# Patient Record
Sex: Female | Born: 1945 | Race: White | Hispanic: No | Marital: Married | Smoking: Never smoker
Health system: Southern US, Community
[De-identification: ages and names within clinical notes are randomized; demographics above are authoritative.]

## PROBLEM LIST (undated history)

## (undated) DIAGNOSIS — M810 Age-related osteoporosis without current pathological fracture: Secondary | ICD-10-CM

## (undated) DIAGNOSIS — H269 Unspecified cataract: Secondary | ICD-10-CM

## (undated) DIAGNOSIS — IMO0002 Reserved for concepts with insufficient information to code with codable children: Secondary | ICD-10-CM

## (undated) DIAGNOSIS — I639 Cerebral infarction, unspecified: Secondary | ICD-10-CM

## (undated) DIAGNOSIS — E079 Disorder of thyroid, unspecified: Secondary | ICD-10-CM

## (undated) DIAGNOSIS — N8110 Cystocele, unspecified: Secondary | ICD-10-CM

## (undated) DIAGNOSIS — R7611 Nonspecific reaction to tuberculin skin test without active tuberculosis: Secondary | ICD-10-CM

## (undated) DIAGNOSIS — M329 Systemic lupus erythematosus, unspecified: Secondary | ICD-10-CM

## (undated) DIAGNOSIS — I10 Essential (primary) hypertension: Secondary | ICD-10-CM

## (undated) DIAGNOSIS — T7840XA Allergy, unspecified, initial encounter: Secondary | ICD-10-CM

## (undated) DIAGNOSIS — H409 Unspecified glaucoma: Secondary | ICD-10-CM

## (undated) HISTORY — DX: Cystocele, unspecified: N81.10

## (undated) HISTORY — DX: Unspecified glaucoma: H40.9

## (undated) HISTORY — DX: Allergy, unspecified, initial encounter: T78.40XA

## (undated) HISTORY — PX: TUBAL LIGATION: SHX77

## (undated) HISTORY — DX: Cerebral infarction, unspecified: I63.9

## (undated) HISTORY — DX: Age-related osteoporosis without current pathological fracture: M81.0

## (undated) HISTORY — PX: EYE SURGERY: SHX253

## (undated) HISTORY — DX: Nonspecific reaction to tuberculin skin test without active tuberculosis: R76.11

## (undated) HISTORY — DX: Essential (primary) hypertension: I10

## (undated) HISTORY — DX: Unspecified cataract: H26.9

---

## 2002-01-13 HISTORY — PX: SCLERAL BUCKLE: SHX5340

## 2018-05-20 DIAGNOSIS — I2510 Atherosclerotic heart disease of native coronary artery without angina pectoris: Secondary | ICD-10-CM | POA: Insufficient documentation

## 2018-05-27 ENCOUNTER — Ambulatory Visit: Payer: Self-pay | Admitting: Internal Medicine

## 2018-08-16 ENCOUNTER — Ambulatory Visit: Payer: Self-pay | Admitting: Internal Medicine

## 2019-02-13 ENCOUNTER — Ambulatory Visit: Payer: Self-pay

## 2019-02-20 ENCOUNTER — Ambulatory Visit: Payer: Medicare Other | Attending: Internal Medicine

## 2019-02-20 DIAGNOSIS — Z23 Encounter for immunization: Secondary | ICD-10-CM | POA: Insufficient documentation

## 2019-02-20 NOTE — Progress Notes (Signed)
   Covid-19 Vaccination Clinic  Name:  Wendy Jackson    MRN: [id number] DOB: [birth year]  02/20/2019  [name] was observed post Covid-19 immunization for 15 minutes without incidence. She was provided with Vaccine Information Sheet and instruction to access the V-Safe system.   [name] was instructed to call 911 with any severe reactions post vaccine: Marland Kitchen Difficulty breathing  . Swelling of your face and throat  . A fast heartbeat  . A bad rash all over your body  . Dizziness and weakness    Immunizations Administered    Name Date Dose VIS Date Route   Pfizer COVID-19 Vaccine 02/20/2019  9:35 AM 0.3 mL 12/24/2018 Intramuscular   Manufacturer: [place]   Lot: [id number]   [place]: [id number]

## 2019-02-24 ENCOUNTER — Ambulatory Visit: Payer: Self-pay

## 2019-03-14 DIAGNOSIS — H269 Unspecified cataract: Secondary | ICD-10-CM

## 2019-03-14 HISTORY — DX: Unspecified cataract: H26.9

## 2019-03-16 ENCOUNTER — Ambulatory Visit: Payer: Medicare Other | Attending: Internal Medicine

## 2019-03-16 DIAGNOSIS — Z23 Encounter for immunization: Secondary | ICD-10-CM | POA: Insufficient documentation

## 2019-03-16 NOTE — Progress Notes (Signed)
   Covid-19 Vaccination Clinic  Name:  Wendy Jackson    MRN: [id number] DOB: 18-Aug-1945  03/16/2019  [name] was observed post Covid-19 immunization for 15 minutes without incident. She was provided with Vaccine Information Sheet and instruction to access the V-Safe system.   [name] was instructed to call 911 with any severe reactions post vaccine: Marland Kitchen Difficulty breathing  . Swelling of face and throat  . A fast heartbeat  . A bad rash all over body  . Dizziness and weakness   Immunizations Administered    Name Date Dose VIS Date Route   Pfizer COVID-19 Vaccine 03/16/2019  4:18 PM 0.3 mL 12/24/2018 Intramuscular   Manufacturer: [place]   Lot: [id number]   [place]: [id number]

## 2019-08-02 NOTE — Patient Instructions (Addendum)
Health Maintenance Due  Topic Date Due   Hepatitis C Screening  Never done   TETANUS/TDAP  Never done   MAMMOGRAM  Never done   COLONOSCOPY  Never done   DEXA SCAN  Never done   PNA vac Low Risk Adult (1 of 2 - PCV13) Never done    No flowsheet data found.  Rheumatolgy: Dr. Estanislado Pandy  Dr. Trudie Reed Dr. Amil Amen

## 2019-08-03 ENCOUNTER — Other Ambulatory Visit: Payer: Self-pay

## 2019-08-04 ENCOUNTER — Encounter: Payer: Self-pay | Admitting: Family Medicine

## 2019-08-04 ENCOUNTER — Ambulatory Visit (INDEPENDENT_AMBULATORY_CARE_PROVIDER_SITE_OTHER): Payer: Medicare Other | Admitting: Family Medicine

## 2019-08-04 VITALS — BP 149/86 | HR 72 | Temp 97.2°F | Ht 62.75 in | Wt 88.8 lb

## 2019-08-04 DIAGNOSIS — Z13820 Encounter for screening for osteoporosis: Secondary | ICD-10-CM

## 2019-08-04 DIAGNOSIS — Z131 Encounter for screening for diabetes mellitus: Secondary | ICD-10-CM

## 2019-08-04 DIAGNOSIS — Z7689 Persons encountering health services in other specified circumstances: Secondary | ICD-10-CM | POA: Diagnosis not present

## 2019-08-04 DIAGNOSIS — E039 Hypothyroidism, unspecified: Secondary | ICD-10-CM | POA: Diagnosis not present

## 2019-08-04 DIAGNOSIS — Z1231 Encounter for screening mammogram for malignant neoplasm of breast: Secondary | ICD-10-CM

## 2019-08-04 DIAGNOSIS — Z1211 Encounter for screening for malignant neoplasm of colon: Secondary | ICD-10-CM

## 2019-08-04 DIAGNOSIS — R030 Elevated blood-pressure reading, without diagnosis of hypertension: Secondary | ICD-10-CM

## 2019-08-04 DIAGNOSIS — Z2821 Immunization not carried out because of patient refusal: Secondary | ICD-10-CM

## 2019-08-04 DIAGNOSIS — Z23 Encounter for immunization: Secondary | ICD-10-CM

## 2019-08-04 DIAGNOSIS — Z13220 Encounter for screening for lipoid disorders: Secondary | ICD-10-CM

## 2019-08-04 DIAGNOSIS — Z1321 Encounter for screening for nutritional disorder: Secondary | ICD-10-CM

## 2019-08-04 MED ORDER — ARMOUR THYROID 30 MG PO TABS: 30.0000 mg | ORAL_TABLET | Freq: Every day | ORAL | 3 refills | Status: DC

## 2019-08-04 NOTE — Progress Notes (Signed)
Wendy Jackson is a 74 y.o. female  Chief Complaint  Patient presents with  . Establish Care    New patient here for CPE.  Pt due for for mammogram, colonoscopy, dexa scan.  Pt would like referrals for all three.  Pt is also due for Tdap and PCV 13 and will discuss with Dr.C.  Pt is not fasting for lab work today.    HPI: Wendy Jackson is a 74 y.o. female here as a new patient to establish care with our office. She is requesting an annual exam. She is not fasting for labs. She recently moved to [place] in 10/2018 and previous PCP in [place], [place]. She was also following with another physician (Dr. Lilian Kapur) from whom she was getting her thyroid medication and "bioidentical hormones" to treat her decreased bone density.  Pt is due/overdue for mammo, colonoscopy (she had never had one), dexa and requests referrals for all 3 today.  Pt is due for Tdap, pneumococcal vaccines. She declines pneumonia vaccine today but is agreeable to Tdap  Med refills needed today: armour thyroid 30mg  daily. Last TFTs in fall 2019  She has a h/o lupus. This dx was questioned by another rheum so pt is unsure. She has rheum in [place] and does not want referral for new rheum here at this time.  History reviewed. No pertinent past medical history.  History reviewed. No pertinent surgical history.  Social History   Socioeconomic History  . Marital status: Married    Spouse name: Not on file  . Number of children: Not on file  . Years of education: Not on file  . Highest education level: Not on file  Occupational History  . Not on file  Tobacco Use  . Smoking status: Never Smoker  . Smokeless tobacco: Never Used  Substance and Sexual Activity  . Alcohol use: Never  . Drug use: Never  . Sexual activity: Not on file  Other Topics Concern  . Not on file  Social History Narrative  . Not on file   Social Determinants of Health   Financial Resource Strain:   . Difficulty of Paying Living Expenses:   Food Insecurity:    . Worried About Charity fundraiser in the Last Year:   . Arboriculturist in the Last Year:   Transportation Needs:   . Film/video editor (Medical):   Marland Kitchen Lack of Transportation (Non-Medical):   Physical Activity:   . Days of Exercise per Week:   . Minutes of Exercise per Session:   Stress:   . Feeling of Stress :   Social Connections:   . Frequency of Communication with Friends and Family:   . Frequency of Social Gatherings with Friends and Family:   . Attends Religious Services:   . Active Member of Clubs or Organizations:   . Attends Archivist Meetings:   Marland Kitchen Marital Status:   Intimate Partner Violence:   . Fear of Current or Ex-Partner:   . Emotionally Abused:   Marland Kitchen Physically Abused:   . Sexually Abused:     History reviewed. No pertinent family history.   Immunization History  Administered Date(s) Administered  . PFIZER SARS-COV-2 Vaccination 02/20/2019, 03/16/2019    Outpatient Encounter Medications as of 08/04/2019  Medication Sig  . ARMOUR THYROID 30 MG tablet Take 1 tablet (30 mg total) by mouth daily.  . cetirizine (ZYRTEC) 10 MG chewable tablet Chew 10 mg by mouth daily.  . Cholecalciferol (VITAMIN D3) 1.25 MG (  50000 UT) TABS Take by mouth.  Marland Kitchen HAWTHORNE PO Take by mouth.  . hydroxychloroquine (PLAQUENIL) 200 MG tablet Take 200 mg by mouth daily.  Marland Kitchen LUMIGAN 0.01 % SOLN   . Magnesium Citrate 100 MG TABS Take 150 mg by mouth. Pt taking 1.5 dose  . Misc Natural Products (AIRBORNE ELDERBERRY) CHEW Chew by mouth.  . Probiotic Product (UP4 PROBIOTICS WOMENS PO) Take by mouth.  Marland Kitchen SIMBRINZA 1-0.2 % SUSP INSTILL 1 DROP INTO BOTH EYES TWICE A DAY  . Ubiquinol 100 MG CAPS Take by mouth.  . [DISCONTINUED] ARMOUR THYROID 30 MG tablet Take 30 mg by mouth daily.   No facility-administered encounter medications on file as of 08/04/2019.     ROS: Gen: no fever, chills  Skin: no rash, itching ENT: no ear pain, ear drainage, nasal congestion, rhinorrhea, sinus  pressure, sore throat Eyes: no blurry vision, double vision Resp: no cough, wheeze,SOB CV: no CP, palpitations, LE edema,  GI: no heartburn, n/v/d/c, abd pain GU: no dysuria, urgency, frequency, hematuria  MSK: no joint pain, myalgias, back pain Neuro: no dizziness, headache, weakness, vertigo Psych: no depression, anxiety, insomnia   Allergies  Allergen Reactions  . Augmentin [Amoxicillin-Pot Clavulanate]   . Griseofulvin Ultramicrosize [Griseofulvin] Nausea Only  . Flexeril [Cyclobenzaprine] Palpitations    BP (!) 149/86 (BP Location: Left Arm)   Pulse 72   Temp (!) 97.2 F (36.2 C) (Temporal)   Ht 5' 2.75" (1.594 m)   Wt (!) 88 lb 12.8 oz (40.3 kg)   SpO2 100%   BMI 15.86 kg/m   BP Readings from Last 3 Encounters:  08/04/19 (!) 149/86    Physical Exam Constitutional:      General: She is not in acute distress.    Appearance: She is well-developed.  HENT:     Head: Normocephalic and atraumatic.     Right Ear: Tympanic membrane and ear canal normal.     Left Ear: Tympanic membrane and ear canal normal.     Nose: Nose normal.  Eyes:     Conjunctiva/sclera: Conjunctivae normal.     Pupils: Pupils are equal, round, and reactive to light.  Neck:     Thyroid: No thyromegaly.  Cardiovascular:     Rate and Rhythm: Normal rate and regular rhythm.     Heart sounds: Normal heart sounds. No murmur heard.   Pulmonary:     Effort: Pulmonary effort is normal. No respiratory distress.     Breath sounds: Normal breath sounds. No wheezing or rhonchi.  Abdominal:     General: Bowel sounds are normal. There is no distension.     Palpations: Abdomen is soft. There is no mass.     Tenderness: There is no abdominal tenderness.  Musculoskeletal:     Cervical back: Neck supple.  Lymphadenopathy:     Cervical: No cervical adenopathy.  Skin:    General: Skin is warm and dry.  Neurological:     Mental Status: She is alert and oriented to person, place, and time.     Motor: No  abnormal muscle tone.     Coordination: Coordination normal.  Psychiatric:        Behavior: Behavior normal.      A/P:  1. Encounter to establish care with new doctor  2. Hypothyroidism, unspecified type Refill: - ARMOUR THYROID 30 MG tablet; Take 1 tablet (30 mg total) by mouth daily.  Dispense: 90 tablet; Refill: 3 - TSH; Future - T4, free; Future  3. Screening for  colon cancer - Ambulatory referral to Gastroenterology  4. Encounter for screening mammogram for malignant neoplasm of breast - MM DIGITAL SCREENING BILATERAL; Future  5. Screening for osteoporosis - DG Bone Density; Future  6. Screening for lipid disorders - Lipid panel; Future  7. Encounter for vitamin deficiency screening - VITAMIN D 25 Hydroxy (Vit-D Deficiency, Fractures); Future  8. Screening for diabetes mellitus (DM) - Comprehensive metabolic panel; Future  9. Need for diphtheria-tetanus-pertussis (Tdap) vaccine - Tdap vaccine greater than or equal to 7yo IM  10. Pneumococcal vaccination declined  11. Elevated BP without diagnosis of hypertension - will await records from previous PCP - pt states BP is always high at office, hasn't checked at home - recommend check at home 3-4x/wk x 2 wks then sending or calling with readings  This visit occurred during the SARS-CoV-2 public health emergency.  Safety protocols were in place, including screening questions prior to the visit, additional usage of staff PPE, and extensive cleaning of exam room while observing appropriate contact time as indicated for disinfecting solutions.

## 2019-08-08 ENCOUNTER — Other Ambulatory Visit: Payer: Self-pay

## 2019-08-09 ENCOUNTER — Other Ambulatory Visit (INDEPENDENT_AMBULATORY_CARE_PROVIDER_SITE_OTHER): Payer: Medicare Other

## 2019-08-09 DIAGNOSIS — Z1321 Encounter for screening for nutritional disorder: Secondary | ICD-10-CM

## 2019-08-09 DIAGNOSIS — Z131 Encounter for screening for diabetes mellitus: Secondary | ICD-10-CM

## 2019-08-09 DIAGNOSIS — Z13220 Encounter for screening for lipoid disorders: Secondary | ICD-10-CM

## 2019-08-09 DIAGNOSIS — E039 Hypothyroidism, unspecified: Secondary | ICD-10-CM | POA: Diagnosis not present

## 2019-08-09 LAB — COMPREHENSIVE METABOLIC PANEL
ALT: 21 U/L (ref 0–35)
AST: 28 U/L (ref 0–37)
Albumin: 4.2 g/dL (ref 3.5–5.2)
Alkaline Phosphatase: 79 U/L (ref 39–117)
BUN: 15 mg/dL (ref 6–23)
CO2: 29 mEq/L (ref 19–32)
Calcium: 9.3 mg/dL (ref 8.4–10.5)
Chloride: 102 mEq/L (ref 96–112)
Creatinine, Ser: 0.71 mg/dL (ref 0.40–1.20)
GFR: 80.78 mL/min (ref >60.00)
Glucose, Bld: 84 mg/dL (ref 70–99)
Potassium: 3.8 mEq/L (ref 3.5–5.1)
Sodium: 137 mEq/L (ref 135–145)
Total Bilirubin: 0.4 mg/dL (ref 0.2–1.2)
Total Protein: 6.8 g/dL (ref 6.0–8.3)

## 2019-08-09 LAB — LIPID PANEL
Cholesterol: 191 mg/dL (ref 0–200)
HDL: 82.4 mg/dL (ref >39.00)
LDL Cholesterol: 97 mg/dL (ref 0–99)
NonHDL: 108.78
Total CHOL/HDL Ratio: 2
Triglycerides: 58 mg/dL (ref 0.0–149.0)
VLDL: 11.6 mg/dL (ref 0.0–40.0)

## 2019-08-09 LAB — T4, FREE: Free T4: 0.74 ng/dL (ref 0.60–1.60)

## 2019-08-09 LAB — TSH: TSH: 2.51 u[IU]/mL (ref 0.35–4.50)

## 2019-08-09 LAB — VITAMIN D 25 HYDROXY (VIT D DEFICIENCY, FRACTURES): VITD: 86.32 ng/mL (ref 30.00–100.00)

## 2019-08-10 ENCOUNTER — Encounter: Payer: Self-pay | Admitting: Family Medicine

## 2019-09-13 ENCOUNTER — Telehealth: Payer: Self-pay | Admitting: Family Medicine

## 2019-09-13 NOTE — Telephone Encounter (Signed)
Attempted to schedule AWV. Unable to LVM.  Will try at later time.  

## 2020-01-14 DIAGNOSIS — I10 Essential (primary) hypertension: Secondary | ICD-10-CM

## 2020-01-14 HISTORY — PX: EYE SURGERY: SHX253

## 2020-01-14 HISTORY — DX: Essential (primary) hypertension: I10

## 2020-02-07 DIAGNOSIS — H401132 Primary open-angle glaucoma, bilateral, moderate stage: Secondary | ICD-10-CM | POA: Diagnosis not present

## 2020-02-15 ENCOUNTER — Encounter: Payer: Self-pay | Admitting: Family Medicine

## 2020-02-21 DIAGNOSIS — H401112 Primary open-angle glaucoma, right eye, moderate stage: Secondary | ICD-10-CM | POA: Diagnosis not present

## 2020-03-06 ENCOUNTER — Ambulatory Visit (INDEPENDENT_AMBULATORY_CARE_PROVIDER_SITE_OTHER): Payer: Medicare Other

## 2020-03-06 VITALS — Ht 62.75 in | Wt 88.0 lb

## 2020-03-06 DIAGNOSIS — Z0000 Encounter for general adult medical examination without abnormal findings: Secondary | ICD-10-CM

## 2020-03-06 NOTE — Progress Notes (Signed)
Subjective:   Wendy Jackson is a 75 y.o. female who presents for an Initial Medicare Annual Wellness Visit.  I connected with Wendy Jackson today by telephone and verified that I am speaking with the correct person using two identifiers. Location patient: home Location provider: work Persons participating in the virtual visit: patient, Marine scientist.    I discussed the limitations, risks, security and privacy concerns of performing an evaluation and management service by telephone and the availability of in person appointments. I also discussed with the patient that there may be a patient responsible charge related to this service. The patient expressed understanding and verbally consented to this telephonic visit.    Interactive audio and video telecommunications were attempted between this provider and patient, however failed, due to patient having technical difficulties OR patient did not have access to video capability.  We continued and completed visit with audio only.  Some vital signs may be absent or patient reported.   Time Spent with patient on telephone encounter: 25 minutes   Review of Systems     Cardiac Risk Factors include: advanced age (>36men, >45 women)     Objective:    Today's Vitals   03/06/20 1545  Weight: 88 lb (39.9 kg)  Height: 5' 2.75" (1.594 m)   Body mass index is 15.71 kg/m.  Advanced Directives 03/06/2020  Does Patient Have a Medical Advance Directive? No  Does patient want to make changes to medical advance directive? Yes (MAU/Ambulatory/Procedural Areas - Information given)    Current Medications (verified) Outpatient Encounter Medications as of 03/06/2020  Medication Sig  . ARMOUR THYROID 30 MG tablet Take 1 tablet (30 mg total) by mouth daily.  . cetirizine (ZYRTEC) 10 MG chewable tablet Chew 10 mg by mouth daily.  . Cholecalciferol (VITAMIN D3) 1.25 MG (50000 UT) TABS Take by mouth.  Marland Kitchen HAWTHORNE PO Take by mouth.  . hydroxychloroquine (PLAQUENIL) 200 MG  tablet Take 200 mg by mouth daily.  Marland Kitchen LUMIGAN 0.01 % SOLN   . Magnesium Citrate 100 MG TABS Take 150 mg by mouth. Pt taking 1.5 dose  . Misc Natural Products (AIRBORNE ELDERBERRY) CHEW Chew by mouth.  . Probiotic Product (UP4 PROBIOTICS WOMENS PO) Take by mouth.  Marland Kitchen SIMBRINZA 1-0.2 % SUSP INSTILL 1 DROP INTO BOTH EYES TWICE A DAY  . Ubiquinol 100 MG CAPS Take by mouth.   No facility-administered encounter medications on file as of 03/06/2020.    Allergies (verified) Augmentin [amoxicillin-pot clavulanate], Tramadol, and Flexeril [cyclobenzaprine]   History: History reviewed. No pertinent past medical history. Past Surgical History:  Procedure Laterality Date  . EYE SURGERY     History reviewed. No pertinent family history. Social History   Socioeconomic History  . Marital status: Married    Spouse name: Not on file  . Number of children: Not on file  . Years of education: Not on file  . Highest education level: Not on file  Occupational History  . Not on file  Tobacco Use  . Smoking status: Never Smoker  . Smokeless tobacco: Never Used  Vaping Use  . Vaping Use: Never used  Substance and Sexual Activity  . Alcohol use: Never  . Drug use: Never  . Sexual activity: Not on file  Other Topics Concern  . Not on file  Social History Narrative  . Not on file   Social Determinants of Health   Financial Resource Strain: Low Risk   . Difficulty of Paying Living Expenses: Not hard at all  Food  Insecurity: No Food Insecurity  . Worried About Charity fundraiser in the Last Year: Never true  . Ran Out of Food in the Last Year: Never true  Transportation Needs: No Transportation Needs  . Lack of Transportation (Medical): No  . Lack of Transportation (Non-Medical): No  Physical Activity: Insufficiently Active  . Days of Exercise per Week: 4 days  . Minutes of Exercise per Session: 10 min  Stress: No Stress Concern Present  . Feeling of Stress : Only a little  Social  Connections: Moderately Integrated  . Frequency of Communication with Friends and Family: More than three times a week  . Frequency of Social Gatherings with Friends and Family: Once a week  . Attends Religious Services: 1 to 4 times per year  . Active Member of Clubs or Organizations: No  . Attends Archivist Meetings: Never  . Marital Status: Married    Tobacco Counseling Counseling given: Not Answered   Clinical Intake:  Pre-visit preparation completed: Yes  Pain : No/denies pain     Nutritional Status: BMI <19  Underweight Nutritional Risks: None Diabetes: No  How often do you need to have someone help you when you read instructions, pamphlets, or other written materials from your doctor or pharmacy?: 1 - Never  Diabetic?No  Interpreter Needed?: No  Information entered by :: Caroleen Hamman LPN   Activities of Daily Living In your present state of health, do you have any difficulty performing the following activities: 03/06/2020  Hearing? N  Vision? N  Difficulty concentrating or making decisions? N  Walking or climbing stairs? N  Dressing or bathing? N  Doing errands, shopping? N  Preparing Food and eating ? N  Using the Toilet? N  In the past six months, have you accidently leaked urine? N  Do you have problems with loss of bowel control? N  Managing your Medications? N  Managing your Finances? N  Housekeeping or managing your Housekeeping? N  Some recent data might be hidden    Patient Care Team: Ronnald Nian, DO as PCP - General (Family Medicine)  Indicate any recent Medical Services you may have received from other than [facility] providers in the past year (date may be approximate).     Assessment:   This is a routine wellness examination for Wendy Jackson.  Hearing/Vision screen  Hearing Screening   125Hz  250Hz  500Hz  1000Hz  2000Hz  3000Hz  4000Hz  6000Hz  8000Hz   Right ear:           Left ear:           Comments: No issues  Vision Screening  Comments: Reading glasses  Last eye exam-01/2020  Dietary issues and exercise activities discussed: Current Exercise Habits: Home exercise routine, Type of exercise: Other - see comments (Eliptical), Time (Minutes): 10, Frequency (Times/Week): 4, Weekly Exercise (Minutes/Week): 40, Intensity: Mild, Exercise limited by: None identified  Goals    . Patient Stated     Increase activity      Depression Screen PHQ 2/9 Scores 03/06/2020  PHQ - 2 Score 0    Fall Risk Fall Risk  03/06/2020  Falls in the past year? 0  Number falls in past yr: 0  Injury with Fall? 0  Follow up Falls prevention discussed    FALL RISK PREVENTION PERTAINING TO THE HOME:  Any stairs in or around the home? Yes  If so, are there any without handrails? No  Home free of loose throw rugs in walkways, pet beds, electrical cords, etc?  Yes  Adequate lighting in your home to reduce risk of falls? Yes   ASSISTIVE DEVICES UTILIZED TO PREVENT FALLS:  Life alert? No  Use of a cane, walker or w/c? No  Grab bars in the bathroom? No  Shower chair or bench in shower? Yes  Elevated toilet seat or a handicapped toilet? No   TIMED UP AND GO:  Was the test performed? No . Phone visit   Cognitive Function:Normal cognitive status assessed by  this Nurse Health Advisor. No abnormalities found.          Immunizations Immunization History  Administered Date(s) Administered  . PFIZER(Purple Top)SARS-COV-2 Vaccination 02/20/2019, 03/16/2019, 11/11/2019  . Tdap 08/04/2019    TDAP status: Up to date  Flu Vaccine status: Declined, Education has been provided regarding the importance of this vaccine but patient still declined. Advised may receive this vaccine at local pharmacy or Health Dept. Aware to provide a copy of the vaccination record if obtained from local pharmacy or Health Dept. Verbalized acceptance and understanding.  Pneumococcal vaccine status: Due, Education has been provided regarding the importance of  this vaccine. Advised may receive this vaccine at local pharmacy or Health Dept. Aware to provide a copy of the vaccination record if obtained from local pharmacy or Health Dept. Verbalized acceptance and understanding.  Covid-19 vaccine status: Completed vaccines  Qualifies for Shingles Vaccine? Yes   Zostavax completed No   Shingrix Completed?: No.    Education has been provided regarding the importance of this vaccine. Patient has been advised to call insurance company to determine out of pocket expense if they have not yet received this vaccine. Advised may also receive vaccine at local pharmacy or Health Dept. Verbalized acceptance and understanding.  Screening Tests Health Maintenance  Topic Date Due  . Hepatitis C Screening  Never done  . COLONOSCOPY (Pts 45-32yrs Insurance coverage will need to be confirmed)  Never done  . MAMMOGRAM  Never done  . DEXA SCAN  Never done  . PNA vac Low Risk Adult (1 of 2 - PCV13) Never done  . INFLUENZA VACCINE  Never done  . TETANUS/TDAP  08/03/2029  . COVID-19 Vaccine  Completed    Health Maintenance  Health Maintenance Due  Topic Date Due  . Hepatitis C Screening  Never done  . COLONOSCOPY (Pts 45-34yrs Insurance coverage will need to be confirmed)  Never done  . MAMMOGRAM  Never done  . DEXA SCAN  Never done  . PNA vac Low Risk Adult (1 of 2 - PCV13) Never done  . INFLUENZA VACCINE  Never done    Colorectal Cancer screening: Due-Declined today.  Mammogram status: Due- Declined today  Bone Density status: Due-Declined today  Lung Cancer Screening: (Low Dose CT Chest recommended if Age 57-80 years, 30 pack-year currently smoking OR have quit w/in 15years.) does not qualify.     Additional Screening:  Hepatitis C Screening: does qualify; Discuss with PCP  Vision Screening: Recommended annual ophthalmology exams for early detection of glaucoma and other disorders of the eye. Is the patient up to date with their annual eye exam?   Yes  Who is the provider or what is the name of the office in which the patient attends annual eye exams? Unsure of name  Dental Screening: Recommended annual dental exams for proper oral hygiene  Community Resource Referral / Chronic Care Management: CRR required this visit?  No   CCM required this visit?  No      Plan:  I have personally reviewed and noted the following in the patient's chart:   . Medical and social history . Use of alcohol, tobacco or illicit drugs  . Current medications and supplements . Functional ability and status . Nutritional status . Physical activity . Advanced directives . List of other physicians . Hospitalizations, surgeries, and ER visits in previous 12 months . Vitals . Screenings to include cognitive, depression, and falls . Referrals and appointments  In addition, I have reviewed and discussed with patient certain preventive protocols, quality metrics, and best practice recommendations. A written personalized care plan for preventive services as well as general preventive health recommendations were provided to patient.   Due to this being a telephonic visit, the after visit summary with patients personalized plan was offered to patient via mail or my-chart. Patient would like to access on my-chart.    Marta Antu, LPN   08/07/3662  Nurse Health Advisor Nurse Notes: None

## 2020-03-06 NOTE — Patient Instructions (Signed)
Wendy Jackson , Thank you for taking time to complete your Medicare Wellness Visit. I appreciate your ongoing commitment to your health goals. Please review the following plan we discussed and let me know if I can assist you in the future.   Screening recommendations/referrals: Colonoscopy: Due-Declined today. Please call the office to schedule if you change. Mammogram: Due-Declined today. Please call the office to schedule if you change. Bone Density: Due-Declined today. Please call the office to schedule if you change. Recommended yearly ophthalmology/optometry visit for glaucoma screening and checkup Recommended yearly dental visit for hygiene and checkup  Vaccinations: Influenza vaccine:Declined Pneumococcal vaccine: Unsure of vaccine status Tdap vaccine: Up to date-Due 08/03/2029. Shingles vaccine: Discuss with pharmacy   Covid-19:Completed vaccines  Advanced directives: Information mailed today  Conditions/risks identified: See problem list  Next appointment: Follow up in one year for your annual wellness visit    Preventive Care 65 Years and Older, Female Preventive care refers to lifestyle choices and visits with your health care provider that can promote health and wellness. What does preventive care include?  A yearly physical exam. This is also called an annual well check.  Dental exams once or twice a year.  Routine eye exams. Ask your health care provider how often you should have your eyes checked.  Personal lifestyle choices, including:  Daily care of your teeth and gums.  Regular physical activity.  Eating a healthy diet.  Avoiding tobacco and drug use.  Limiting alcohol use.  Practicing safe sex.  Taking low-dose aspirin every day.  Taking vitamin and mineral supplements as recommended by your health care provider. What happens during an annual well check? The services and screenings done by your health care provider during your annual well check will  depend on your age, overall health, lifestyle risk factors, and family history of disease. Counseling  Your health care provider may ask you questions about your:  Alcohol use.  Tobacco use.  Drug use.  Emotional well-being.  Home and relationship well-being.  Sexual activity.  Eating habits.  History of falls.  Memory and ability to understand (cognition).  Work and work Statistician.  Reproductive health. Screening  You may have the following tests or measurements:  Height, weight, and BMI.  Blood pressure.  Lipid and cholesterol levels. These may be checked every 5 years, or more frequently if you are over 52 years old.  Skin check.  Lung cancer screening. You may have this screening every year starting at age 74 if you have a 30-pack-year history of smoking and currently smoke or have quit within the past 15 years.  Fecal occult blood test (FOBT) of the stool. You may have this test every year starting at age 74.  Flexible sigmoidoscopy or colonoscopy. You may have a sigmoidoscopy every 5 years or a colonoscopy every 10 years starting at age 74.  Hepatitis C blood test.  Hepatitis B blood test.  Sexually transmitted disease (STD) testing.  Diabetes screening. This is done by checking your blood sugar (glucose) after you have not eaten for a while (fasting). You may have this done every 1-3 years.  Bone density scan. This is done to screen for osteoporosis. You may have this done starting at age 74.  Mammogram. This may be done every 1-2 years. Talk to your health care provider about how often you should have regular mammograms. Talk with your health care provider about your test results, treatment options, and if necessary, the need for more tests. Vaccines  Your  health care provider may recommend certain vaccines, such as:  Influenza vaccine. This is recommended every year.  Tetanus, diphtheria, and acellular pertussis (Tdap, Td) vaccine. You may need a  Td booster every 10 years.  Zoster vaccine. You may need this after age 74.  Pneumococcal 13-valent conjugate (PCV13) vaccine. One dose is recommended after age 1.  Pneumococcal polysaccharide (PPSV23) vaccine. One dose is recommended after age 42. Talk to your health care provider about which screenings and vaccines you need and how often you need them. This information is not intended to replace advice given to you by your health care provider. Make sure you discuss any questions you have with your health care provider. Document Released: 01/26/2015 Document Revised: 09/19/2015 Document Reviewed: 10/31/2014 Elsevier Interactive Patient Education  2017 [place] Prevention in the Home Falls can cause injuries. They can happen to people of all ages. There are many things you can do to make your home safe and to help prevent falls. What can I do on the outside of my home?  Regularly fix the edges of walkways and driveways and fix any cracks.  Remove anything that might make you trip as you walk through a door, such as a raised step or threshold.  Trim any bushes or trees on the path to your home.  Use bright outdoor lighting.  Clear any walking paths of anything that might make someone trip, such as rocks or tools.  Regularly check to see if handrails are loose or broken. Make sure that both sides of any steps have handrails.  Any raised decks and porches should have guardrails on the edges.  Have any leaves, snow, or ice cleared regularly.  Use sand or salt on walking paths during winter.  Clean up any spills in your garage right away. This includes oil or grease spills. What can I do in the bathroom?  Use night lights.  Install grab bars by the toilet and in the tub and shower. Do not use towel bars as grab bars.  Use non-skid mats or decals in the tub or shower.  If you need to sit down in the shower, use a plastic, non-slip stool.  Keep the floor dry. Clean  up any water that spills on the floor as soon as it happens.  Remove soap buildup in the tub or shower regularly.  Attach bath mats securely with double-sided non-slip rug tape.  Do not have throw rugs and other things on the floor that can make you trip. What can I do in the bedroom?  Use night lights.  Make sure that you have a light by your bed that is easy to reach.  Do not use any sheets or blankets that are too big for your bed. They should not hang down onto the floor.  Have a firm chair that has side arms. You can use this for support while you get dressed.  Do not have throw rugs and other things on the floor that can make you trip. What can I do in the kitchen?  Clean up any spills right away.  Avoid walking on wet floors.  Keep items that you use a lot in easy-to-reach places.  If you need to reach something above you, use a strong step stool that has a grab bar.  Keep electrical cords out of the way.  Do not use floor polish or wax that makes floors slippery. If you must use wax, use non-skid floor wax.  Do not  have throw rugs and other things on the floor that can make you trip. What can I do with my stairs?  Do not leave any items on the stairs.  Make sure that there are handrails on both sides of the stairs and use them. Fix handrails that are broken or loose. Make sure that handrails are as long as the stairways.  Check any carpeting to make sure that it is firmly attached to the stairs. Fix any carpet that is loose or worn.  Avoid having throw rugs at the top or bottom of the stairs. If you do have throw rugs, attach them to the floor with carpet tape.  Make sure that you have a light switch at the top of the stairs and the bottom of the stairs. If you do not have them, ask someone to add them for you. What else can I do to help prevent falls?  Wear shoes that:  Do not have high heels.  Have rubber bottoms.  Are comfortable and fit you well.  Are  closed at the toe. Do not wear sandals.  If you use a stepladder:  Make sure that it is fully opened. Do not climb a closed stepladder.  Make sure that both sides of the stepladder are locked into place.  Ask someone to hold it for you, if possible.  Clearly mark and make sure that you can see:  Any grab bars or handrails.  First and last steps.  Where the edge of each step is.  Use tools that help you move around (mobility aids) if they are needed. These include:  Canes.  Walkers.  Scooters.  Crutches.  Turn on the lights when you go into a dark area. Replace any light bulbs as soon as they burn out.  Set up your furniture so you have a clear path. Avoid moving your furniture around.  If any of your floors are uneven, fix them.  If there are any pets around you, be aware of where they are.  Review your medicines with your doctor. Some medicines can make you feel dizzy. This can increase your chance of falling. Ask your doctor what other things that you can do to help prevent falls. This information is not intended to replace advice given to you by your health care provider. Make sure you discuss any questions you have with your health care provider. Document Released: 10/26/2008 Document Revised: 06/07/2015 Document Reviewed: 02/03/2014 Elsevier Interactive Patient Education  2017 [employer].

## 2020-04-03 DIAGNOSIS — H35373 Puckering of macula, bilateral: Secondary | ICD-10-CM | POA: Diagnosis not present

## 2020-04-13 DIAGNOSIS — I639 Cerebral infarction, unspecified: Secondary | ICD-10-CM

## 2020-04-13 HISTORY — DX: Cerebral infarction, unspecified: I63.9

## 2020-04-30 ENCOUNTER — Encounter (HOSPITAL_COMMUNITY): Payer: Self-pay

## 2020-04-30 ENCOUNTER — Emergency Department (HOSPITAL_COMMUNITY): Payer: Medicare Other

## 2020-04-30 ENCOUNTER — Telehealth: Payer: Self-pay | Admitting: Family Medicine

## 2020-04-30 ENCOUNTER — Other Ambulatory Visit: Payer: Self-pay

## 2020-04-30 ENCOUNTER — Inpatient Hospital Stay (HOSPITAL_COMMUNITY)
Admission: EM | Admit: 2020-04-30 | Discharge: 2020-05-04 | DRG: 039 | Disposition: A | Discharge status: Home / Self Care | Payer: Medicare Other | Attending: Internal Medicine | Admitting: Internal Medicine

## 2020-04-30 ENCOUNTER — Other Ambulatory Visit (HOSPITAL_COMMUNITY): Payer: Self-pay

## 2020-04-30 DIAGNOSIS — Z820 Family history of epilepsy and other diseases of the nervous system: Secondary | ICD-10-CM | POA: Diagnosis not present

## 2020-04-30 DIAGNOSIS — G8324 Monoplegia of upper limb affecting left nondominant side: Secondary | ICD-10-CM | POA: Diagnosis not present

## 2020-04-30 DIAGNOSIS — Z885 Allergy status to narcotic agent status: Secondary | ICD-10-CM | POA: Diagnosis not present

## 2020-04-30 DIAGNOSIS — Z881 Allergy status to other antibiotic agents status: Secondary | ICD-10-CM | POA: Diagnosis not present

## 2020-04-30 DIAGNOSIS — R29700 NIHSS score 0: Secondary | ICD-10-CM | POA: Diagnosis not present

## 2020-04-30 DIAGNOSIS — M329 Systemic lupus erythematosus, unspecified: Secondary | ICD-10-CM | POA: Diagnosis not present

## 2020-04-30 DIAGNOSIS — I10 Essential (primary) hypertension: Secondary | ICD-10-CM | POA: Diagnosis present

## 2020-04-30 DIAGNOSIS — Z888 Allergy status to other drugs, medicaments and biological substances status: Secondary | ICD-10-CM

## 2020-04-30 DIAGNOSIS — E785 Hyperlipidemia, unspecified: Secondary | ICD-10-CM | POA: Diagnosis not present

## 2020-04-30 DIAGNOSIS — R42 Dizziness and giddiness: Secondary | ICD-10-CM | POA: Diagnosis not present

## 2020-04-30 DIAGNOSIS — Z791 Long term (current) use of non-steroidal anti-inflammatories (NSAID): Secondary | ICD-10-CM

## 2020-04-30 DIAGNOSIS — Z20822 Contact with and (suspected) exposure to covid-19: Secondary | ICD-10-CM | POA: Diagnosis present

## 2020-04-30 DIAGNOSIS — J309 Allergic rhinitis, unspecified: Secondary | ICD-10-CM | POA: Diagnosis present

## 2020-04-30 DIAGNOSIS — R200 Anesthesia of skin: Secondary | ICD-10-CM | POA: Diagnosis present

## 2020-04-30 DIAGNOSIS — IMO0002 Reserved for concepts with insufficient information to code with codable children: Secondary | ICD-10-CM | POA: Diagnosis present

## 2020-04-30 DIAGNOSIS — E039 Hypothyroidism, unspecified: Secondary | ICD-10-CM | POA: Diagnosis not present

## 2020-04-30 DIAGNOSIS — R531 Weakness: Secondary | ICD-10-CM | POA: Diagnosis not present

## 2020-04-30 DIAGNOSIS — Z79899 Other long term (current) drug therapy: Secondary | ICD-10-CM | POA: Diagnosis not present

## 2020-04-30 DIAGNOSIS — I639 Cerebral infarction, unspecified: Secondary | ICD-10-CM | POA: Diagnosis present

## 2020-04-30 DIAGNOSIS — Z79890 Hormone replacement therapy (postmenopausal): Secondary | ICD-10-CM | POA: Diagnosis not present

## 2020-04-30 DIAGNOSIS — I63231 Cerebral infarction due to unspecified occlusion or stenosis of right carotid arteries: Secondary | ICD-10-CM | POA: Diagnosis not present

## 2020-04-30 DIAGNOSIS — Z8673 Personal history of transient ischemic attack (TIA), and cerebral infarction without residual deficits: Secondary | ICD-10-CM | POA: Diagnosis present

## 2020-04-30 DIAGNOSIS — I6521 Occlusion and stenosis of right carotid artery: Secondary | ICD-10-CM | POA: Diagnosis not present

## 2020-04-30 DIAGNOSIS — I6389 Other cerebral infarction: Secondary | ICD-10-CM | POA: Diagnosis not present

## 2020-04-30 HISTORY — DX: Systemic lupus erythematosus, unspecified: M32.9

## 2020-04-30 HISTORY — DX: Reserved for concepts with insufficient information to code with codable children: IMO0002

## 2020-04-30 HISTORY — DX: Disorder of thyroid, unspecified: E07.9

## 2020-04-30 LAB — URINALYSIS, ROUTINE W REFLEX MICROSCOPIC
Bilirubin Urine: NEGATIVE
Glucose, UA: NEGATIVE mg/dL
Hgb urine dipstick: NEGATIVE
Ketones, ur: NEGATIVE mg/dL
Nitrite: NEGATIVE
Protein, ur: NEGATIVE mg/dL
Specific Gravity, Urine: 1.005 (ref 1.005–1.030)
pH: 7 (ref 5.0–8.0)

## 2020-04-30 LAB — BASIC METABOLIC PANEL
Anion gap: 10 (ref 5–15)
BUN: 18 mg/dL (ref 8–23)
CO2: 26 mmol/L (ref 22–32)
Calcium: 8.9 mg/dL (ref 8.9–10.3)
Chloride: 103 mmol/L (ref 98–111)
Creatinine, Ser: 0.65 mg/dL (ref 0.44–1.00)
GFR, Estimated: >60 mL/min (ref >60)
Glucose, Bld: 111 mg/dL — ABNORMAL HIGH (ref 70–99)
Potassium: 3.9 mmol/L (ref 3.5–5.1)
Sodium: 139 mmol/L (ref 135–145)

## 2020-04-30 LAB — CBC
HCT: 40.8 % (ref 36.0–46.0)
Hemoglobin: 13.3 g/dL (ref 12.0–15.0)
MCH: 31.5 pg (ref 26.0–34.0)
MCHC: 32.6 g/dL (ref 30.0–36.0)
MCV: 96.7 fL (ref 80.0–100.0)
Platelets: 199 10*3/uL (ref 150–400)
RBC: 4.22 MIL/uL (ref 3.87–5.11)
RDW: 12.1 % (ref 11.5–15.5)
WBC: 6.2 10*3/uL (ref 4.0–10.5)
nRBC: 0 % (ref 0.0–0.2)

## 2020-04-30 LAB — HEPATIC FUNCTION PANEL
ALT: 30 U/L (ref 0–44)
AST: 36 U/L (ref 15–41)
Albumin: 4 g/dL (ref 3.5–5.0)
Alkaline Phosphatase: 68 U/L (ref 38–126)
Bilirubin, Direct: 0.1 mg/dL (ref 0.0–0.2)
Indirect Bilirubin: 0.7 mg/dL (ref 0.3–0.9)
Total Bilirubin: 0.8 mg/dL (ref 0.3–1.2)
Total Protein: 6.6 g/dL (ref 6.5–8.1)

## 2020-04-30 LAB — CBG MONITORING, ED: Glucose-Capillary: 91 mg/dL (ref 70–99)

## 2020-04-30 MED ORDER — HYDROXYCHLOROQUINE SULFATE 200 MG PO TABS
200.0000 mg | ORAL_TABLET | Freq: Every day | ORAL | Status: DC
  Administered 2020-05-01 – 2020-05-04 (×4): 200 mg via ORAL
  Filled 2020-04-30 (×4): qty 1

## 2020-04-30 MED ORDER — ATORVASTATIN CALCIUM 10 MG PO TABS
40.0000 mg | ORAL_TABLET | Freq: Every day | ORAL | Status: DC
  Administered 2020-05-01 – 2020-05-04 (×5): 40 mg via ORAL
  Filled 2020-04-30 (×5): qty 4

## 2020-04-30 MED ORDER — THYROID 30 MG PO TABS
30.0000 mg | ORAL_TABLET | Freq: Every day | ORAL | Status: DC
  Administered 2020-05-02 – 2020-05-04 (×3): 30 mg via ORAL
  Filled 2020-04-30 (×5): qty 1

## 2020-04-30 MED ORDER — LORATADINE 10 MG PO TABS
10.0000 mg | ORAL_TABLET | Freq: Every day | ORAL | Status: DC
  Administered 2020-05-01 – 2020-05-04 (×4): 10 mg via ORAL
  Filled 2020-04-30 (×5): qty 1

## 2020-04-30 MED ORDER — ACETAMINOPHEN 325 MG PO TABS
650.0000 mg | ORAL_TABLET | Freq: Four times a day (QID) | ORAL | Status: DC | PRN
  Administered 2020-05-03 – 2020-05-04 (×3): 650 mg via ORAL
  Filled 2020-04-30 (×3): qty 2

## 2020-04-30 MED ORDER — STROKE: EARLY STAGES OF RECOVERY BOOK
Freq: Once | Status: DC
  Filled 2020-04-30: qty 1

## 2020-04-30 MED ORDER — ASPIRIN 325 MG PO TABS
325.0000 mg | ORAL_TABLET | Freq: Once | ORAL | Status: AC
  Administered 2020-04-30: 325 mg via ORAL
  Filled 2020-04-30: qty 1

## 2020-04-30 MED ORDER — ACETAMINOPHEN 650 MG RE SUPP: 650.0000 mg | Freq: Four times a day (QID) | RECTAL | Status: DC | PRN

## 2020-04-30 MED ORDER — ASPIRIN 81 MG PO CHEW
81.0000 mg | CHEWABLE_TABLET | Freq: Every day | ORAL | Status: DC
  Administered 2020-05-01 – 2020-05-04 (×4): 81 mg via ORAL
  Filled 2020-04-30 (×4): qty 1

## 2020-04-30 NOTE — ED Provider Notes (Signed)
MSE was initiated and I personally evaluated the patient and placed orders (if any) at  6:35 PM on April 30, 2020.  The patient appears stable so that the remainder of the MSE may be completed by another provider.   Patient complains of numbness to left hand for 2 days and feeling a little bit dizzy.  Physical exam patient is alert no acute distress MSE complete.  Labs and x-rays ordered   Milton Ferguson, MD 04/30/20 1836

## 2020-04-30 NOTE — ED Provider Notes (Signed)
[facility] DEPT Provider Note   CSN: [id number] Arrival date & time: 04/30/20  1807     History Chief Complaint  Patient presents with  . Dizziness  . Weakness    Kayana Thoen is a 75 y.o. female.  Patient states that she has been having some numbness in her left hand and did feel like she had loss of strength earlier but it is gotten better  The history is provided by the patient and medical records. No language interpreter was used.  Weakness Severity:  Mild Onset quality:  Sudden Timing:  Constant Progression:  Waxing and waning Chronicity:  New Context: not alcohol use   Relieved by:  Nothing Worsened by:  Nothing Ineffective treatments:  None tried Associated symptoms: no abdominal pain, no chest pain, no cough, no diarrhea, no frequency, no headaches and no seizures        Past Medical History:  Diagnosis Date  . Lupus ([place])   . Thyroid disease     There are no problems to display for this patient.   Past Surgical History:  Procedure Laterality Date  . EYE SURGERY       OB History   No obstetric history on file.     Family History  Problem Relation Age of Onset  . ALS Mother   . Dementia Father     Social History   Tobacco Use  . Smoking status: Never Smoker  . Smokeless tobacco: Never Used  Vaping Use  . Vaping Use: Never used  Substance Use Topics  . Alcohol use: Never  . Drug use: Never    Home Medications Prior to Admission medications   Medication Sig Start Date End Date Taking? Authorizing Provider  ARMOUR THYROID 30 MG tablet Take 1 tablet (30 mg total) by mouth daily. 08/04/19   Cirigliano, Garvin Fila, DO  cetirizine (ZYRTEC) 10 MG chewable tablet Chew 10 mg by mouth daily.    [provider]  Cholecalciferol (VITAMIN D3) 1.25 MG (50000 UT) TABS Take by mouth.    [provider]  HAWTHORNE PO Take by mouth.    [provider]  hydroxychloroquine (PLAQUENIL) 200 MG tablet  Take 200 mg by mouth daily. 07/01/19   [provider]  LUMIGAN 0.01 % SOLN  08/03/19   [provider]  Magnesium Citrate 100 MG TABS Take 150 mg by mouth. Pt taking 1.5 dose    [provider]  Misc Natural Products (AIRBORNE ELDERBERRY) CHEW Chew by mouth.    [provider]  Probiotic Product (UP4 PROBIOTICS WOMENS PO) Take by mouth.    [provider]  SIMBRINZA 1-0.2 % SUSP INSTILL 1 DROP INTO BOTH EYES TWICE A DAY 06/27/19   [provider]  Ubiquinol 100 MG CAPS Take by mouth.    [provider]    Allergies    Augmentin [amoxicillin-pot clavulanate], Tramadol, and Flexeril [cyclobenzaprine]  Review of Systems   Review of Systems  Constitutional: Negative for appetite change and fatigue.  HENT: Negative for congestion, ear discharge and sinus pressure.   Eyes: Negative for discharge.  Respiratory: Negative for cough.   Cardiovascular: Negative for chest pain.  Gastrointestinal: Negative for abdominal pain and diarrhea.  Genitourinary: Negative for frequency and hematuria.  Musculoskeletal: Negative for back pain.  Skin: Negative for rash.  Neurological: Positive for weakness. Negative for seizures and headaches.  Psychiatric/Behavioral: Negative for hallucinations.    Physical Exam Updated Vital Signs BP (!) 185/81 (BP Location: Right  Arm)   Pulse 71   Temp 98.3 F (36.8 C) (Oral)   Resp 14   Ht 5\' 4"  (1.626 m)   Wt 45.4 kg   SpO2 99%   BMI 17.16 kg/m   Physical Exam Vitals and nursing note reviewed.  Constitutional:      Appearance: She is well-developed.  HENT:     Head: Normocephalic.     Nose: Nose normal.  Eyes:     General: No scleral icterus.    Conjunctiva/sclera: Conjunctivae normal.  Neck:     Thyroid: No thyromegaly.  Cardiovascular:     Rate and Rhythm: Normal rate and regular rhythm.     Heart sounds: No murmur heard. No friction rub. No gallop.   Pulmonary:     Breath sounds: No  stridor. No wheezing or rales.  Chest:     Chest wall: No tenderness.  Abdominal:     General: There is no distension.     Tenderness: There is no abdominal tenderness. There is no rebound.  Musculoskeletal:        General: Normal range of motion.     Cervical back: Neck supple.     Comments: Minimal numbness to left hand  Lymphadenopathy:     Cervical: No cervical adenopathy.  Skin:    Findings: No erythema or rash.  Neurological:     Mental Status: She is alert and oriented to person, place, and time.     Motor: No abnormal muscle tone.     Coordination: Coordination normal.  Psychiatric:        Behavior: Behavior normal.     ED Results / Procedures / Treatments   Labs (all labs ordered are listed, but only abnormal results are displayed) Labs Reviewed  BASIC METABOLIC PANEL - Abnormal; Notable for the following components:      Result Value   Glucose, Bld 111 (*)    All other components within normal limits  URINALYSIS, ROUTINE W REFLEX MICROSCOPIC - Abnormal; Notable for the following components:   Color, Urine STRAW (*)    Leukocytes,Ua LARGE (*)    Bacteria, UA RARE (*)    All other components within normal limits  URINE CULTURE  CBC  HEPATIC FUNCTION PANEL  CBG MONITORING, ED    EKG None  Radiology DG Chest 2 View  Result Date: 04/30/2020 CLINICAL DATA:  Numbness, left hand weakness EXAM: CHEST - 2 VIEW COMPARISON:  None. FINDINGS: There is hyperinflation of the lungs compatible with COPD. Calcified granuloma in the left upper lobe. Biapical scarring. No acute confluent opacities. Heart is normal size. No acute bony abnormality. IMPRESSION: COPD/chronic changes.  No active disease. Electronically Signed   By: Rolm Baptise M.D.   On: 04/30/2020 19:13   CT Head Wo Contrast  Result Date: 04/30/2020 CLINICAL DATA:  Dizziness, left hand numbness EXAM: CT HEAD WITHOUT CONTRAST TECHNIQUE: Contiguous axial images were obtained from the base of the skull through the  vertex without intravenous contrast. COMPARISON:  None. FINDINGS: Brain: No acute intracranial abnormality. Specifically, no hemorrhage, hydrocephalus, mass lesion, acute infarction, or significant intracranial injury. Vascular: No hyperdense vessel or unexpected calcification. Skull: No acute calvarial abnormality. Sinuses/Orbits: Visualized paranasal sinuses and mastoids clear. Orbital soft tissues unremarkable. Other: None IMPRESSION: No acute intracranial abnormality. Electronically Signed   By: Rolm Baptise M.D.   On: 04/30/2020 19:30   MR BRAIN WO CONTRAST  Result Date: 04/30/2020 CLINICAL DATA:  Dizziness with left hand numbness EXAM: MRI HEAD WITHOUT CONTRAST TECHNIQUE:  Multiplanar, multiecho pulse sequences of the brain and surrounding structures were obtained without intravenous contrast. COMPARISON:  None. 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 No other abnormal diffusion restriction. No acute or chronic hemorrhage. There is multifocal hyperintense T2-weighted signal within the white matter. Parenchymal volume and CSF spaces are normal. The midline structures are normal. Vascular: Major flow voids are preserved. Skull and upper cervical spine: Normal calvarium and skull base. Visualized upper cervical spine and soft tissues are normal. Sinuses/Orbits:No paranasal sinus fluid levels or advanced mucosal thickening. No mastoid or middle ear effusion. Normal orbits. IMPRESSION: 1. 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 No hemorrhage or mass effect. 2. Findings of chronic small vessel ischemia. Electronically Signed   By: Ulyses Jarred M.D.   On: 04/30/2020 22:34    Procedures Procedures   Medications Ordered in ED Medications - No data to display  ED Course  I have reviewed the triage vital signs and the nursing notes.  Pertinent labs & imaging results that were available during my care of the patient were  reviewed by me and considered in my medical decision making (see chart for details).    CRITICAL CARE Performed by: Milton Ferguson Total critical care time: 45 minutes Critical care time was exclusive of separately billable procedures and treating other patients. Critical care was necessary to treat or prevent imminent or life-threatening deterioration. Critical care was time spent personally by me on the following activities: development of treatment plan with patient and/or surrogate as well as nursing, discussions with consultants, evaluation of patient's response to treatment, examination of patient, obtaining history from patient or surrogate, ordering and performing treatments and interventions, ordering and review of laboratory studies, ordering and review of radiographic studies, pulse oximetry and re-evaluation of patient's condition. MRI shows small stroke.  I spoke with Dr. Cheral Marker neurology and he recommended a CT angio of the head neck with admission by hospitalist, MDM Rules/Calculators/A&P                          Patient with a small stroke seen on MRI.  She will be admitted to the hospitalist service with neurology consult account Final Clinical Impression(s) / ED Diagnoses Final diagnoses:  None    Rx / DC Orders ED Discharge Orders    None       Milton Ferguson, MD 04/30/20 2307

## 2020-04-30 NOTE — Telephone Encounter (Signed)
Pt called in stating she was experiencing numbing of her left hand, dizziness and unsteady walking at times. I called the triage nurse and transferred her over.

## 2020-04-30 NOTE — ED Triage Notes (Signed)
Patient c/o weakness, dizziness, and left hand numbness x 2-3 days. Patient states she felt normal this morning and then this afternoon symptoms returned.

## 2020-04-30 NOTE — H&P (Signed)
History and Physical    PLEASE NOTE THAT DRAGON DICTATION SOFTWARE WAS USED IN THE CONSTRUCTION OF THIS NOTE.   Wendy Jackson PXT:[id number] DOB: 1945/10/07 DOA: 04/30/2020  PCP: Ronnald Nian, DO Patient coming from: home   I have personally briefly reviewed patient's old medical records in [place]  Chief Complaint: left hand numbness  HPI: Wendy Jackson is a 75 y.o. female with medical history significant for lupus, acquired hypothyroidism, allergic rhinitis who is admitted to [facility] on 04/30/2020 with acute ischemic stroke after presenting from home to [facility] ED complaining of left hand numbness.   The patient reports acute onset of left hand numbness on Friday, April 27, 2020.  She believes that a onset of this occurred mid afternoon on that day, although she is unable to pinpoint a specific time that she first noticed this symptom.  She states that her left hand numbness has been constant since onset, although she feels that numbness is decreasing in terms of severity over the last 12-24 hours, but still present at this time.  She states that the numbness starts at the level of the left wrist and distally across the palmar and dorsal surfaces and terminates in the distal aspect of all 5 digits.  She notes associated decrease in grip strength over that time, noting that she has dropped objects that she normally would have no trouble carrying, including pieces of paper and the cap to a lip balm.  Otherwise, she denies any recent change in sensation, including no additional numbness or paresthesias.  She also notes no additional acute weakness.  She reports ongoing dizziness since the time of initially noting the left hand numbness, but denies any associated vertigo, nausea, vomiting, change in vision, blurry vision, diplopia, word finding difficulties, or headache.  She also denies any associated facial droop or slurred speech.  Denies any history of prior similar symptoms  and no history of stroke.  She denies any associated chest pain, shortness of breath, palpitations, diaphoresis, presyncope, or syncope.   Denies any previous history of stroke. In terms of modifiable risk factors, denies a known history of underlying hypertension, hyperlipidemia, diabetes, atrial fibrillation, or obstructive sleep apnea.  She reports that she is a lifelong non-smoker, and denies any use of recreational drugs.  Not on any blood thinners as an outpatient, including no aspirin.  She also reports that she has not on any statin medication at home.  Denies any associated subjective fever, chills, rigors, or generalized myalgias.  No recent neck stiffness, wheezing, cough, abdominal pain, diarrhea, or rash.  No recent travel or known COVID-19 exposures.  Denies any recent dysuria, gross hematuria, or change in urinary urgency/frequency.      ED Course:  Vital signs in the ED were notable for the following: Temperature max 98.3, heart rate 7181; blood pressure 162/83; respiratory rate 15-21; oxygen saturation 98 100% on room air.  Labs were notable for the following: CMP was notable for the following: Sodium 139, potassium 3.9, bicarbonate 26, creatinine 0.65, glucose 111.  CBC notable for white blood cell count 6200.  Urinalysis showed 6-10 white blood cells and rare bacteria.  Screening nasopharyngeal COVID-19 PCR performed in [facility] long emergency department this evening, and result is pending at this time.  Chest x-ray showed no evidence of acute cardiopulmonary process.  Noncontrast CT head showed no evidence of acute intracranial process, including no evidence of intracranial hemorrhage.  MRI of the brain without contrast showed a small acute  infarct involving the right precentral and postcentral gyri, without evidence of hemorrhage or mass-effect.  The patient's case and imaging were discussed with the on-call neurologist, Dr. Cheral Marker, Who recommends admission to [facility] for  further evaluation and management of acute ischemic stroke, including additional imaging in the form of CTA of the head and neck.  Additionally, Dr.Lindzen recommends further assessment of potential modifiable acute ischemic CVA respecters, including checking hemoglobin A1c, lipid panel and telemetry monitoring for paroxysmal atrial fibrillation.  Dr. Cheral Marker will see the patient after arrival at [facility].   Subsequently, the patient was admitted to [facility] for further evaluation and management of presenting acute ischemic stroke.    Review of Systems: As per HPI otherwise 10 point review of systems negative.   Past Medical History:  Diagnosis Date  . Lupus ([place])   . Thyroid disease     Past Surgical History:  Procedure Laterality Date  . EYE SURGERY      Social History:  reports that she has never smoked. She has never used smokeless tobacco. She reports that she does not drink alcohol and does not use drugs.   Allergies  Allergen Reactions  . Augmentin [Amoxicillin-Pot Clavulanate] Other (See Comments)    unk  . Tramadol     Nausea   . Flexeril [Cyclobenzaprine] Palpitations    Family History  Problem Relation Age of Onset  . ALS Mother   . Dementia Father       Prior to Admission medications   Medication Sig Start Date End Date Taking? Authorizing Provider  ARMOUR THYROID 30 MG tablet Take 1 tablet (30 mg total) by mouth daily. 08/04/19  Yes Cirigliano, Mary K, DO  cetirizine (ZYRTEC) 10 MG chewable tablet Chew 10 mg by mouth daily.   Yes [provider]  Cholecalciferol (VITAMIN D3) 1.25 MG (50000 UT) TABS Take 5,000 Units by mouth daily.   Yes [provider]  HAWTHORNE PO Take 1 tablet by mouth daily.   Yes [provider]  hydroxychloroquine (PLAQUENIL) 200 MG tablet Take 200 mg by mouth daily. 07/01/19  Yes [provider]  levOCARNitine L-Tartrate (L-CARNITINE) 500 MG CAPS Take 500 mg by mouth daily.   Yes  [provider]  LUMIGAN 0.01 % SOLN Place 1 drop into both eyes at bedtime. 08/03/19  Yes [provider]  Magnesium Citrate 100 MG TABS Take 150 mg by mouth. Pt taking 1.5 dose   Yes [provider]  Misc Natural Products (AIRBORNE ELDERBERRY) CHEW Chew 1 tablet by mouth in the morning and at bedtime.   Yes [provider]  Misc Natural Products (CURCUMAX PRO PO) Take 250 mg by mouth daily.   Yes [provider]  Probiotic Product (UP4 PROBIOTICS WOMENS PO) Take 1 capsule by mouth daily.   Yes [provider]  Resveratrol 50 MG CAPS Take 50 mg by mouth daily.   Yes [provider]  SIMBRINZA 1-0.2 % SUSP Place 1 drop into both eyes in the morning, at noon, and at bedtime. 06/27/19  Yes [provider]  Ubiquinol 100 MG CAPS Take 100 mg by mouth.   Yes [provider]  VITAMIN K PO Take 180 mcg by mouth daily.   Yes [provider]     Objective    Physical Exam: Vitals:   04/30/20 2228 04/30/20 2229 04/30/20 2230 04/30/20 2238  BP:   (!) 162/83   Pulse: 78 72 69 80  Resp: (!) 22 20  10 16  Temp:      TempSrc:      SpO2: 99% 99% 99% 99%  Weight:      Height:        General: appears to be stated age; alert, oriented Skin: warm, dry, no rash Head:  AT/[place] Mouth:  Oral mucosa membranes appear moist, normal dentition Neck: supple; trachea midline Heart:  RRR; did not appreciate any M/R/G Lungs: CTAB, did not appreciate any wheezes, rales, or rhonchi Abdomen: + BS; soft, ND, NT Vascular: 2+ pedal pulses b/l; 2+ radial pulses b/l Extremities: no peripheral edema, no muscle wasting Neuro: 5/5 strength of the proximal and distal flexors and extensors of the upper and lower extremities bilaterally; mild decrease in sensation over the dorsal aspect of of the left hand and extending into all 5 digits; otherwise, sensation intact in upper and lower extremities b/l; cranial nerves II through XII grossly  intact; no pronator drift; no evidence suggestive of slurred speech, dysarthria, or facial droop; Normal muscle tone. No tremors.    Labs on Admission: I have personally reviewed following labs and imaging studies  CBC: Recent Labs  Lab 04/30/20 1844  WBC 6.2  HGB 13.3  HCT 40.8  MCV 96.7  PLT 329   Basic Metabolic Panel: Recent Labs  Lab 04/30/20 1844  NA 139  K 3.9  CL 103  CO2 26  GLUCOSE 111*  BUN 18  CREATININE 0.65  CALCIUM 8.9   GFR: Estimated Creatinine Clearance: 44.9 mL/min (by C-G formula based on SCr of 0.65 mg/dL). Liver Function Tests: Recent Labs  Lab 04/30/20 1845  AST 36  ALT 30  ALKPHOS 68  BILITOT 0.8  PROT 6.6  ALBUMIN 4.0   No results for input(s): LIPASE, AMYLASE in the last 168 hours. No results for input(s): AMMONIA in the last 168 hours. Coagulation Profile: No results for input(s): INR, PROTIME in the last 168 hours. Cardiac Enzymes: No results for input(s): CKTOTAL, CKMB, CKMBINDEX, TROPONINI in the last 168 hours. BNP (last 3 results) No results for input(s): PROBNP in the last 8760 hours. HbA1C: No results for input(s): HGBA1C in the last 72 hours. CBG: Recent Labs  Lab 04/30/20 2027  GLUCAP 91   Lipid Profile: No results for input(s): CHOL, HDL, LDLCALC, TRIG, CHOLHDL, LDLDIRECT in the last 72 hours. Thyroid Function Tests: No results for input(s): TSH, T4TOTAL, FREET4, T3FREE, THYROIDAB in the last 72 hours. Anemia Panel: No results for input(s): VITAMINB12, FOLATE, FERRITIN, TIBC, IRON, RETICCTPCT in the last 72 hours. Urine analysis:    Component Value Date/Time   COLORURINE STRAW (A) 04/30/2020 2030   APPEARANCEUR CLEAR 04/30/2020 2030   [place] 1.005 04/30/2020 2030   [place] 7.0 04/30/2020 2030   GLUCOSEU NEGATIVE 04/30/2020 2030   HGBUR NEGATIVE 04/30/2020 2030   [place] 04/30/2020 2030   [place] 04/30/2020 2030   PROTEINUR NEGATIVE 04/30/2020 2030   NITRITE NEGATIVE 04/30/2020  2030   [place] (A) 04/30/2020 2030    Radiological Exams on Admission: DG Chest 2 View  Result Date: 04/30/2020 CLINICAL DATA:  Numbness, left hand weakness EXAM: CHEST - 2 VIEW COMPARISON:  None. FINDINGS: There is hyperinflation of the lungs compatible with COPD. Calcified granuloma in the left upper lobe. Biapical scarring. No acute confluent opacities. Heart is normal size. No acute bony abnormality. IMPRESSION: COPD/chronic changes.  No active disease. Electronically Signed   By: Rolm Baptise M.D.   On: 04/30/2020 19:13   CT Head Wo Contrast  Result Date:  04/30/2020 CLINICAL DATA:  Dizziness, left hand numbness EXAM: CT HEAD WITHOUT CONTRAST TECHNIQUE: Contiguous axial images were obtained from the base of the skull through the vertex without intravenous contrast. COMPARISON:  None. FINDINGS: Brain: No acute intracranial abnormality. Specifically, no hemorrhage, hydrocephalus, mass lesion, acute infarction, or significant intracranial injury. Vascular: No hyperdense vessel or unexpected calcification. Skull: No acute calvarial abnormality. Sinuses/Orbits: Visualized paranasal sinuses and mastoids clear. Orbital soft tissues unremarkable. Other: None IMPRESSION: No acute intracranial abnormality. Electronically Signed   By: Rolm Baptise M.D.   On: 04/30/2020 19:30   MR BRAIN WO CONTRAST  Result Date: 04/30/2020 CLINICAL DATA:  Dizziness with left hand numbness EXAM: MRI HEAD WITHOUT CONTRAST TECHNIQUE: Multiplanar, multiecho pulse sequences of the brain and surrounding structures were obtained without intravenous contrast. COMPARISON:  None. 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 No other abnormal diffusion restriction. No acute or chronic hemorrhage. There is multifocal hyperintense T2-weighted signal within the white matter. Parenchymal volume and CSF spaces are normal. The midline structures are normal. Vascular: Major flow voids are  preserved. Skull and upper cervical spine: Normal calvarium and skull base. Visualized upper cervical spine and soft tissues are normal. Sinuses/Orbits:No paranasal sinus fluid levels or advanced mucosal thickening. No mastoid or middle ear effusion. Normal orbits. IMPRESSION: 1. 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 No hemorrhage or mass effect. 2. Findings of chronic small vessel ischemia. Electronically Signed   By: Ulyses Jarred M.D.   On: 04/30/2020 22:34     Assessment/Plan   Wendy Jackson is a 75 y.o. female with medical history significant for lupus, acquired hypothyroidism, allergic rhinitis who is admitted to [facility] on 04/30/2020 with acute ischemic stroke after presenting from home to [facility] ED complaining of left hand numbness.    Principal Problem:   Acute ischemic stroke [facility]) Active Problems:   Acquired hypothyroidism   Lupus (HCC)   Numbness of left hand   Allergic rhinitis    #) Acute ischemic stroke: Diagnosis on the basis of 3 days of acute onset left hand numbness with potential associated decreased grip strength in that hand, with MRI brain showing evidence of small acute infarct involving the right precentral and postcentral gyri, as further noted above. The patient's case and imaging were discussed with the on-call neurologist, Dr. Cheral Marker, Who recommends admission to [facility] for further evaluation and management of acute ischemic stroke, including additional imaging in the form of CTA of the head and neck.  Additionally, Dr.Lindzen recommends further assessment of potential modifiable acute ischemic CVA respecters, including checking hemoglobin A1c, lipid panel and telemetry monitoring for paroxysmal atrial fibrillation.  Dr. Cheral Marker will see the patient after arrival at [facility].   Of note, the patient does not appear to possess any modifiable CVA risk factors, per her report at this time, including denial of any history of  history of type 2 diabetes mellitus, hypertension, hyperlipidemia, tobacco abuse, OSA, or paroxsymal atrial fibrillation. Will check ekg at this time.  Given the onset of the patient's symptoms 3 days ago, she presents to the ED today while outside of the timeframe for tPA administration.  Not currently on any blood thinners as an outpatient, including no aspirin.  Additionally, she is not currently on any statin medication at home.  Consequently, we will order full dose aspirin x1 now, followed by daily baby aspirin, with the possibility of initiation of a brief course of dual antiplatelet therapy per  ensuing neurology consultation.  We will also start high intensity atorvastatin, with first dose now, with lipid panel currently pending.  Additionally, she is outside of the 24 to 48-hour window for observance of permissive hypertension.   Plan: Nursing bedside swallow evaluation x 1 now, and will not initiate oral medications or diet until the patient has passed this. Head of the bed at 30 degrees. Neuro checks per protocol. VS per protocol. Monitor on telemetry, including monitoring for atrial fibrillation as modifiable risk factor for acute ischemic CVA.  Should overnight telemetry not demonstrate evidence of atrial fibrillation, can consider 30-day cardiac event monitor at the time of discharge to further evaluate for this arrhythmia.  CTA of the head and neck have been ordered, per neurology consultation, as above.  Additionally, TTE with bubble study has been ordered.  PT/OT consults have been ordered to occur in the morning (05/01/20).  Check lipid panel and hemoglobin A1c.  Full dose aspirin x1 now.  Daily baby aspirin, with consideration for a brief course of dual antiplatelet therapy, as above.  Atorvastatin 40 mg p.o. daily.  EKG x1 now.      #) Lupus: On hydroxychloroquine as an outpatient.  Plan: Continue home hydroxychloroquine.      #) Acquired hypothyroidism: On Armour Thyroid  supplementation as an outpatient.  Plan: Check TSH.  Continue home dose of Armour Thyroid for now.      #) Allergic rhinitis: On scheduled as an outpatient.  Plan: Continue home cetirizine.       DVT prophylaxis: scd's  Code Status: Full code Family Communication: Case was discussed with the patient's husband, who was present at bedside Disposition Plan: Per Rounding Team Consults called: Case/imaging were discussed with the on-call neurologist, Dr. Cheral Marker, as further described above. Admission status: Observation; med telemetry at [facility].     Of note, this patient was added by me to the following Admit List/Treatment Team: armcadmits.      PLEASE NOTE THAT DRAGON DICTATION SOFTWARE WAS USED IN THE CONSTRUCTION OF THIS NOTE.   [facility] Pager [phone] From [place]  Otherwise, please contact night-coverage  [email or web address] Password [facility]   04/30/2020, 11:27 PM

## 2020-05-01 ENCOUNTER — Observation Stay (HOSPITAL_BASED_OUTPATIENT_CLINIC_OR_DEPARTMENT_OTHER): Payer: Medicare Other

## 2020-05-01 ENCOUNTER — Observation Stay (HOSPITAL_COMMUNITY): Payer: Medicare Other

## 2020-05-01 DIAGNOSIS — Z79890 Hormone replacement therapy (postmenopausal): Secondary | ICD-10-CM | POA: Diagnosis not present

## 2020-05-01 DIAGNOSIS — I639 Cerebral infarction, unspecified: Secondary | ICD-10-CM | POA: Diagnosis not present

## 2020-05-01 DIAGNOSIS — I10 Essential (primary) hypertension: Secondary | ICD-10-CM | POA: Diagnosis not present

## 2020-05-01 DIAGNOSIS — Z885 Allergy status to narcotic agent status: Secondary | ICD-10-CM | POA: Diagnosis not present

## 2020-05-01 DIAGNOSIS — M329 Systemic lupus erythematosus, unspecified: Secondary | ICD-10-CM | POA: Diagnosis present

## 2020-05-01 DIAGNOSIS — I6389 Other cerebral infarction: Secondary | ICD-10-CM | POA: Diagnosis not present

## 2020-05-01 DIAGNOSIS — Z20822 Contact with and (suspected) exposure to covid-19: Secondary | ICD-10-CM | POA: Diagnosis not present

## 2020-05-01 DIAGNOSIS — J309 Allergic rhinitis, unspecified: Secondary | ICD-10-CM | POA: Diagnosis not present

## 2020-05-01 DIAGNOSIS — E039 Hypothyroidism, unspecified: Secondary | ICD-10-CM | POA: Diagnosis present

## 2020-05-01 DIAGNOSIS — R200 Anesthesia of skin: Secondary | ICD-10-CM | POA: Diagnosis present

## 2020-05-01 DIAGNOSIS — Z881 Allergy status to other antibiotic agents status: Secondary | ICD-10-CM | POA: Diagnosis not present

## 2020-05-01 DIAGNOSIS — Z820 Family history of epilepsy and other diseases of the nervous system: Secondary | ICD-10-CM | POA: Diagnosis not present

## 2020-05-01 DIAGNOSIS — IMO0002 Reserved for concepts with insufficient information to code with codable children: Secondary | ICD-10-CM | POA: Diagnosis present

## 2020-05-01 DIAGNOSIS — Z79899 Other long term (current) drug therapy: Secondary | ICD-10-CM | POA: Diagnosis not present

## 2020-05-01 DIAGNOSIS — E785 Hyperlipidemia, unspecified: Secondary | ICD-10-CM | POA: Diagnosis not present

## 2020-05-01 DIAGNOSIS — G8324 Monoplegia of upper limb affecting left nondominant side: Secondary | ICD-10-CM | POA: Diagnosis not present

## 2020-05-01 DIAGNOSIS — I63231 Cerebral infarction due to unspecified occlusion or stenosis of right carotid arteries: Secondary | ICD-10-CM | POA: Diagnosis not present

## 2020-05-01 DIAGNOSIS — Z888 Allergy status to other drugs, medicaments and biological substances status: Secondary | ICD-10-CM | POA: Diagnosis not present

## 2020-05-01 DIAGNOSIS — R42 Dizziness and giddiness: Secondary | ICD-10-CM | POA: Diagnosis not present

## 2020-05-01 DIAGNOSIS — R29700 NIHSS score 0: Secondary | ICD-10-CM | POA: Diagnosis not present

## 2020-05-01 DIAGNOSIS — Z791 Long term (current) use of non-steroidal anti-inflammatories (NSAID): Secondary | ICD-10-CM | POA: Diagnosis not present

## 2020-05-01 LAB — BASIC METABOLIC PANEL
Anion gap: 7 (ref 5–15)
BUN: 13 mg/dL (ref 8–23)
CO2: 26 mmol/L (ref 22–32)
Calcium: 8.9 mg/dL (ref 8.9–10.3)
Chloride: 105 mmol/L (ref 98–111)
Creatinine, Ser: 0.44 mg/dL (ref 0.44–1.00)
GFR, Estimated: >60 mL/min (ref >60)
Glucose, Bld: 91 mg/dL (ref 70–99)
Potassium: 3.7 mmol/L (ref 3.5–5.1)
Sodium: 138 mmol/L (ref 135–145)

## 2020-05-01 LAB — RESP PANEL BY RT-PCR (FLU A&B, COVID) ARPGX2
Influenza A by PCR: NEGATIVE
Influenza B by PCR: NEGATIVE
SARS Coronavirus 2 by RT PCR: NEGATIVE

## 2020-05-01 LAB — LIPID PANEL
Cholesterol: 188 mg/dL (ref 0–200)
HDL: 88 mg/dL (ref >40)
LDL Cholesterol: 93 mg/dL (ref 0–99)
Total CHOL/HDL Ratio: 2.1 RATIO
Triglycerides: 36 mg/dL (ref <150)
VLDL: 7 mg/dL (ref 0–40)

## 2020-05-01 LAB — CBC
HCT: 37.4 % (ref 36.0–46.0)
Hemoglobin: 12.3 g/dL (ref 12.0–15.0)
MCH: 31.2 pg (ref 26.0–34.0)
MCHC: 32.9 g/dL (ref 30.0–36.0)
MCV: 94.9 fL (ref 80.0–100.0)
Platelets: 172 10*3/uL (ref 150–400)
RBC: 3.94 MIL/uL (ref 3.87–5.11)
RDW: 11.9 % (ref 11.5–15.5)
WBC: 6.1 10*3/uL (ref 4.0–10.5)
nRBC: 0 % (ref 0.0–0.2)

## 2020-05-01 LAB — HEMOGLOBIN A1C
Hgb A1c MFr Bld: 5.5 % (ref 4.8–5.6)
Mean Plasma Glucose: 111.15 mg/dL

## 2020-05-01 LAB — ECHOCARDIOGRAM COMPLETE BUBBLE STUDY
Area-P 1/2: 3.6 cm2
S' Lateral: 2.6 cm

## 2020-05-01 LAB — MAGNESIUM: Magnesium: 2.1 mg/dL (ref 1.7–2.4)

## 2020-05-01 LAB — TSH: TSH: 3.36 u[IU]/mL (ref 0.350–4.500)

## 2020-05-01 MED ORDER — IOHEXOL 350 MG/ML SOLN
100.0000 mL | Freq: Once | INTRAVENOUS | Status: AC | PRN
  Administered 2020-05-01: 100 mL via INTRAVENOUS

## 2020-05-01 MED ORDER — CLOPIDOGREL BISULFATE 75 MG PO TABS
75.0000 mg | ORAL_TABLET | Freq: Every day | ORAL | Status: DC
  Administered 2020-05-01 – 2020-05-02 (×2): 75 mg via ORAL
  Filled 2020-05-01 (×2): qty 1

## 2020-05-01 MED ORDER — SODIUM CHLORIDE 0.9% FLUSH
10.0000 mL | Freq: Once | INTRAVENOUS | Status: AC
  Administered 2020-05-01: 10 mL via INTRAVENOUS

## 2020-05-01 MED ORDER — VANCOMYCIN HCL 1000 MG/200ML IV SOLN
1000.0000 mg | INTRAVENOUS | Status: DC
  Filled 2020-05-01: qty 200

## 2020-05-01 NOTE — Consult Note (Signed)
[facility] [place]  Patient name: Wendy Jackson MRN: [id number] DOB: 05/02/1945 Sex: female   HPI: Wendy Jackson is a 75 y.o. female seen in consultation for right carotid stenosis and right brain stroke.  Patient otherwise healthy female who had episode 3 days ago of lightheadedness and weakness and numbness in her left hand.  She is right-handed.  She reports that she dropped several items.  She considered presenting to the emergency room but decided to wait till Monday to speak with her primary care physician.  This had resolved by Monday morning.  Throughout the day on Monday she began having recurrent similar symptoms and called her physician who requested that she report to the emergency department which she did.  Imaging included MRI which revealed small acute infarct involving the right precentral and postcentral gyri.  She then underwent CT angiogram of her head and neck.  This revealed irregular mixed plaque resulting in at least a 50% stenosis in her internal carotid artery.  She has no prior history of neurologic deficits.  She is otherwise healthy.  She has no major risk factors.  She is not a smoker.  Past Medical History:  Diagnosis Date  . Lupus ([place])   . Thyroid disease     Family History  Problem Relation Age of Onset  . ALS Mother   . Dementia Father     SOCIAL HISTORY: Social History   Tobacco Use  . Smoking status: Never Smoker  . Smokeless tobacco: Never Used  Substance Use Topics  . Alcohol use: Never    Allergies  Allergen Reactions  . Augmentin [Amoxicillin-Pot Clavulanate] Other (See Comments)    unk  . Tramadol     Nausea   . Flexeril [Cyclobenzaprine] Palpitations    Current Facility-Administered Medications  Medication Dose Route Frequency Provider Last Rate Last Admin  .  stroke: mapping our Ziyah Cordoba stages of recovery book   Does not apply Once Howerter, Justin B, DO      . acetaminophen  (TYLENOL) tablet 650 mg  650 mg Oral Q6H PRN Howerter, Justin B, DO       Or  . acetaminophen (TYLENOL) suppository 650 mg  650 mg Rectal Q6H PRN Howerter, Justin B, DO      . aspirin chewable tablet 81 mg  81 mg Oral Daily Howerter, Justin B, DO   81 mg at 05/01/20 0915  . atorvastatin (LIPITOR) tablet 40 mg  40 mg Oral Daily Howerter, Justin B, DO   40 mg at 05/01/20 0914  . clopidogrel (PLAVIX) tablet 75 mg  75 mg Oral Daily Rosalin Hawking, MD   75 mg at 05/01/20 0915  . hydroxychloroquine (PLAQUENIL) tablet 200 mg  200 mg Oral Daily Howerter, Justin B, DO   200 mg at 05/01/20 0915  . loratadine (CLARITIN) tablet 10 mg  10 mg Oral Daily Howerter, Justin B, DO   10 mg at 05/01/20 1230  . thyroid (ARMOUR) tablet 30 mg  30 mg Oral Daily Howerter, Justin B, DO        REVIEW OF SYSTEMS:  [X]  denotes positive finding, [ ]  denotes negative finding Cardiac  Comments:  Chest pain or chest pressure:    Shortness of breath upon exertion:    Short of breath when lying flat:    Irregular heart rhythm:        Vascular    Pain in calf, thigh, or hip brought on by ambulation:    Pain in feet  at night that wakes you up from your sleep:     Blood clot in your veins:    Leg swelling:           PHYSICAL EXAM: Vitals:   05/01/20 0600 05/01/20 0726 05/01/20 0800 05/01/20 1040  BP: 135/62 (!) 149/77 (!) 157/66 (!) 158/84  Pulse: 66 71 68 88  Resp: 14 12 10 20   Temp:    97.9 F (36.6 C)  TempSrc:    Oral  SpO2: 99% 99% 99% 100%  Weight:      Height:        GENERAL: The patient is a well-nourished female, in no acute distress. The vital signs are documented above. CARDIOVASCULAR: 2+ radial and 2+ dorsalis pedis pulses bilaterally PULMONARY: There is good air exchange  MUSCULOSKELETAL: There are no major deformities or cyanosis. NEUROLOGIC: No focal weakness or paresthesias are detected. SKIN: There are no ulcers or rashes noted. PSYCHIATRIC: The patient has a normal affect.  DATA:  CT  reveals mixed plaque in the internal carotid artery on the right resulting in at least 6 stenosis.  She has a relatively high bifurcation.  The internal carotid becomes normal above the bifurcation  2D echocardiogram is pending  MEDICAL ISSUES: Right brain stroke with moderate irregular plaque in the right internal carotid artery.  No other source for stroke noted.  2D echocardiogram is pending.  Long discussion with the patient regarding this.  Assuming the 2D echocardiogram shows no cardiogenic source, would recommend right carotid endarterectomy for reduction of stroke risk.  I explained the procedure in detail including location of the incision.  Also discussed 1 to 2% risk of stroke with surgery.  Patient reports that her husband had undergone carotid endarterectomy in [place] approximately 10 years ago so she is familiar with the procedure.  Since she has not had a major stroke, would feel comfortable proceeding with surgery tomorrow.  Will discuss with neurology.    Rosetta Posner, MD FACS [facility] [phone]  Note: Portions of this report may have been transcribed using voice recognition software.  Every effort has been made to ensure accuracy; however, inadvertent computerized transcription errors may still be present.

## 2020-05-01 NOTE — Progress Notes (Signed)
PT Cancellation Note  Patient Details Name: Wendy Jackson MRN: [id number] DOB: 16-Nov-1945   Cancelled Treatment:    Reason Eval/Treat Not Completed: Medical issues which prohibited therapy, transferring to [facility]. [place] Pager [phone] Office [phone]    Claretha Cooper 05/01/2020, 9:08 AM

## 2020-05-01 NOTE — Plan of Care (Signed)
  Problem: Education: Goal: Knowledge of disease or condition will improve 05/01/2020 1628 by Drucie Ip I, RN Outcome: Progressing 05/01/2020 1518 by Drucie Ip I, RN Outcome: Progressing Goal: Knowledge of secondary prevention will improve 05/01/2020 1628 by Drucie Ip I, RN Outcome: Progressing 05/01/2020 1518 by Drucie Ip I, RN Outcome: Progressing Goal: Knowledge of patient specific risk factors addressed and post discharge goals established will improve 05/01/2020 1628 by Drucie Ip I, RN Outcome: Progressing 05/01/2020 1518 by Drucie Ip I, RN Outcome: Progressing Goal: Individualized Educational Video(s) 05/01/2020 1628 by Drucie Ip I, RN Outcome: Progressing 05/01/2020 1518 by Drucie Ip I, RN Outcome: Progressing   Problem: Coping: Goal: Will verbalize positive feelings about self 05/01/2020 1628 by Drucie Ip I, RN Outcome: Progressing 05/01/2020 1518 by Drucie Ip I, RN Outcome: Progressing Goal: Will identify appropriate support needs 05/01/2020 1628 by Drucie Ip I, RN Outcome: Progressing 05/01/2020 1518 by Drucie Ip I, RN Outcome: Progressing   Problem: Health Behavior/Discharge Planning: Goal: Ability to manage health-related needs will improve 05/01/2020 1628 by Drucie Ip I, RN Outcome: Progressing 05/01/2020 1518 by Drucie Ip I, RN Outcome: Progressing   Problem: Self-Care: Goal: Ability to participate in self-care as condition permits will improve 05/01/2020 1628 by Drucie Ip I, RN Outcome: Progressing 05/01/2020 1518 by Drucie Ip I, RN Outcome: Progressing

## 2020-05-01 NOTE — Progress Notes (Signed)
  Echocardiogram 2D Echocardiogram with bubble, strain, and 3D has been performed.  Wendy Jackson M 05/01/2020, 3:08 PM

## 2020-05-01 NOTE — Progress Notes (Signed)
PROGRESS NOTE    Wendy Jackson  VZD:[id number] DOB: 1945-05-14 DOA: 04/30/2020 PCP: Ronnald Nian, DO     Brief Narrative:  Wendy Jackson is a 75 y.o. female with medical history significant for lupus, acquired hypothyroidism, allergic rhinitis who is admitted to [facility] on 04/30/2020 with acute ischemic stroke after presenting from home to [facility] ED complaining of left hand numbness.   In the emergency department, she underwent MRI of the brain which revealed small acute infarct involving the right precentral and postcentral gyri, without evidence of hemorrhage or mass-effect.  Neurology was consulted and patient transferred to [place] events last 24 hours / Subjective: Patient seen in [facility] emergency department, awaiting transfer to [facility].  She admits to left hand numbness and some weakness, dropping small objects at home.  She has no other physical complaints.  Assessment & Plan:   Principal Problem:   Acute ischemic stroke ([place]) Active Problems:   Acquired hypothyroidism   Lupus (HCC)   Numbness of left hand   Allergic rhinitis   Acute CVA -Neurology consult -PT OT SLP -Aspirin, Plavix -Lipitor -Echocardiogram pending -DVT ultrasound pending  Lupus -Continue hydroxychloroquine  Hypothyroidism -Continue Armour Thyroid    DVT prophylaxis:  SCDs Start: 04/30/20 2331  Code Status: Full code Family Communication: No family at bedside Disposition Plan:  Status is: Observation  The patient will require care spanning > 2 midnights and should be moved to inpatient because: Inpatient level of care appropriate due to severity of illness  Dispo: The patient is from: Home              Anticipated d/c is to: Home              Patient currently is not medically stable to d/c.  Pending further stroke work-up.  Transfer to [facility] today   Difficult to place patient No      Consultants:    Neurology   Antimicrobials:  Anti-infectives (From admission, onward)   Start     Dose/Rate Route Frequency Ordered Stop   05/01/20 1000  hydroxychloroquine (PLAQUENIL) tablet 200 mg        200 mg Oral Daily 04/30/20 2344          Objective: Vitals:   05/01/20 0532 05/01/20 0600 05/01/20 0726 05/01/20 0800  BP: 131/71 135/62 (!) 149/77 (!) 157/66  Pulse: 81 66 71 68  Resp: (!) 29 14 12 10   Temp:      TempSrc:      SpO2: 100% 99% 99% 99%  Weight:      Height:       No intake or output data in the 24 hours ending 05/01/20 0835 Filed Weights   04/30/20 1817  Weight: 45.4 kg    Examination:  General exam: Appears calm and comfortable  Respiratory system: Clear to auscultation. Respiratory effort normal. No respiratory distress. No conversational dyspnea.  Cardiovascular system: S1 & S2 heard, RRR. No murmurs. No pedal edema. Gastrointestinal system: Abdomen is nondistended, soft and nontender. Normal bowel sounds heard. Central nervous system: Alert and oriented.  cranial nerves II through XII grossly intact, strength equal all extremities, speech clear.  Extremities: Symmetric in appearance  Skin: No rashes, lesions or ulcers on exposed skin  Psychiatry: Judgement and insight appear normal. Mood & affect appropriate.   Data Reviewed: I have personally reviewed following labs and imaging studies  CBC: Recent Labs  Lab 04/30/20 1844 05/01/20 0440  WBC 6.2 6.1  HGB 13.3 12.3  HCT 40.8 37.4  MCV 96.7 94.9  PLT 199 885   Basic Metabolic Panel: Recent Labs  Lab 04/30/20 1844 05/01/20 0440  NA 139 138  K 3.9 3.7  CL 103 105  CO2 26 26  GLUCOSE 111* 91  BUN 18 13  CREATININE 0.65 0.44  CALCIUM 8.9 8.9  MG  --  2.1   GFR: Estimated Creatinine Clearance: 44.9 mL/min (by C-G formula based on SCr of 0.44 mg/dL). Liver Function Tests: Recent Labs  Lab 04/30/20 1845  AST 36  ALT 30  ALKPHOS 68  BILITOT 0.8  PROT 6.6  ALBUMIN 4.0   No results for  input(s): LIPASE, AMYLASE in the last 168 hours. No results for input(s): AMMONIA in the last 168 hours. Coagulation Profile: No results for input(s): INR, PROTIME in the last 168 hours. Cardiac Enzymes: No results for input(s): CKTOTAL, CKMB, CKMBINDEX, TROPONINI in the last 168 hours. BNP (last 3 results) No results for input(s): PROBNP in the last 8760 hours. HbA1C: Recent Labs    05/01/20 0440  HGBA1C 5.5   CBG: Recent Labs  Lab 04/30/20 2027  GLUCAP 91   Lipid Profile: Recent Labs    05/01/20 0440  CHOL 188  HDL 88  LDLCALC 93  TRIG 36  CHOLHDL 2.1   Thyroid Function Tests: Recent Labs    05/01/20 0440  TSH 3.360   Anemia Panel: No results for input(s): VITAMINB12, FOLATE, FERRITIN, TIBC, IRON, RETICCTPCT in the last 72 hours. Sepsis Labs: No results for input(s): PROCALCITON, LATICACIDVEN in the last 168 hours.  Recent Results (from the past 240 hour(s))  Resp Panel by RT-PCR (Flu A&B, Covid) Nasopharyngeal Swab     Status: None   Collection Time: 04/30/20 11:29 PM   Specimen: Nasopharyngeal Swab; Nasopharyngeal(NP) swabs in vial transport medium  Result Value Ref Range Status   SARS Coronavirus 2 by RT PCR NEGATIVE NEGATIVE Final    Comment: (NOTE) SARS-CoV-2 target nucleic acids are NOT DETECTED.  The SARS-CoV-2 RNA is generally detectable in upper respiratory specimens during the acute phase of infection. The lowest concentration of SARS-CoV-2 viral copies this assay can detect is 138 copies/mL. A negative result does not preclude SARS-Cov-2 infection and should not be used as the sole basis for treatment or other patient management decisions. A negative result may occur with  improper specimen collection/handling, submission of specimen other than nasopharyngeal swab, presence of viral mutation(s) within the areas targeted by this assay, and inadequate number of viral copies(<138 copies/mL). A negative result must be combined with clinical  observations, patient history, and epidemiological information. The expected result is Negative.  Fact Sheet for Patients:  EntrepreneurPulse.com.au  Fact Sheet for Healthcare Providers:  IncredibleEmployment.be  This test is no t yet approved or cleared by the [place] FDA and  has been authorized for detection and/or diagnosis of SARS-CoV-2 by FDA under an Emergency Use Authorization (EUA). This EUA will remain  in effect (meaning this test can be used) for the duration of the COVID-19 declaration under Section 564(b)(1) of the Act, 21 U.S.C.section 360bbb-3(b)(1), unless the authorization is terminated  or revoked sooner.       Influenza A by PCR NEGATIVE NEGATIVE Final   Influenza B by PCR NEGATIVE NEGATIVE Final    Comment: (NOTE) The Xpert Xpress SARS-CoV-2/FLU/RSV plus assay is intended as an aid in the diagnosis of influenza from Nasopharyngeal swab specimens and should not be used as a sole basis  for treatment. Nasal washings and aspirates are unacceptable for Xpert Xpress SARS-CoV-2/FLU/RSV testing.  Fact Sheet for Patients: EntrepreneurPulse.com.au  Fact Sheet for Healthcare Providers: IncredibleEmployment.be  This test is not yet approved or cleared by the [place] FDA and has been authorized for detection and/or diagnosis of SARS-CoV-2 by FDA under an Emergency Use Authorization (EUA). This EUA will remain in effect (meaning this test can be used) for the duration of the COVID-19 declaration under Section 564(b)(1) of the Act, 21 U.S.C. section 360bbb-3(b)(1), unless the authorization is terminated or revoked.  Performed at [facility], [place] [address] [place], [place] [address]       Radiology Studies: CT Angio Head W or Wo Contrast  Result Date: 05/01/2020 CLINICAL DATA:  Weakness, dizziness and left hand numbness EXAM: CT ANGIOGRAPHY HEAD AND NECK  TECHNIQUE: Multidetector CT imaging of the head and neck was performed using the standard protocol during bolus administration of intravenous contrast. Multiplanar CT image reconstructions and MIPs were obtained to evaluate the vascular anatomy. Carotid stenosis measurements (when applicable) are obtained utilizing NASCET criteria, using the distal internal carotid diameter as the denominator. CONTRAST:  148mL OMNIPAQUE IOHEXOL 350 MG/ML SOLN COMPARISON:  None. FINDINGS: CT HEAD FINDINGS Brain: There is no mass, hemorrhage or extra-axial collection. The size and configuration of the ventricles and extra-axial CSF spaces are normal. There is no acute or chronic infarction. The brain parenchyma is normal. Skull: The visualized skull base, calvarium and extracranial soft tissues are normal. Sinuses/Orbits: No fluid levels or advanced mucosal thickening of the visualized paranasal sinuses. No mastoid or middle ear effusion. The orbits are normal. CTA NECK FINDINGS SKELETON: There is no bony spinal canal stenosis. No lytic or blastic lesion. OTHER NECK: Normal pharynx, larynx and major salivary glands. No cervical lymphadenopathy. Unremarkable thyroid gland. UPPER CHEST: No pneumothorax or pleural effusion. No nodules or masses. AORTIC ARCH: There is calcific atherosclerosis of the aortic arch. There is no aneurysm, dissection or hemodynamically significant stenosis of the visualized portion of the aorta. Conventional 3 vessel aortic branching pattern. The visualized proximal subclavian arteries are widely patent. RIGHT CAROTID SYSTEM: No dissection, occlusion or aneurysm. There is mixed density atherosclerosis extending into the proximal ICA, resulting in 50% stenosis. LEFT CAROTID SYSTEM: No dissection, occlusion or aneurysm. Mild atherosclerotic calcification at the carotid bifurcation without hemodynamically significant stenosis. VERTEBRAL ARTERIES: Left dominant configuration. Both origins are clearly patent. There  is no dissection, occlusion or flow-limiting stenosis to the skull base (V1-V3 segments). CTA HEAD FINDINGS POSTERIOR CIRCULATION: --Vertebral arteries: Normal V4 segments. --Inferior cerebellar arteries: Normal. --Basilar artery: Normal. --Superior cerebellar arteries: Normal. --Posterior cerebral arteries (PCA): Normal. ANTERIOR CIRCULATION: --Intracranial internal carotid arteries: Normal. --Anterior cerebral arteries (ACA): Normal. Both A1 segments are present. Patent anterior communicating artery (a-comm). --Middle cerebral arteries (MCA): Normal. VENOUS SINUSES: As permitted by contrast timing, patent. ANATOMIC VARIANTS: None Review of the MIP images confirms the above findings. IMPRESSION: 1. No intracranial arterial occlusion or high-grade stenosis. 2. 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 Aortic Atherosclerosis (ICD10-I70.0). Electronically Signed   By: Ulyses Jarred M.D.   On: 05/01/2020 01:10   DG Chest 2 View  Result Date: 04/30/2020 CLINICAL DATA:  Numbness, left hand weakness EXAM: CHEST - 2 VIEW COMPARISON:  None. FINDINGS: There is hyperinflation of the lungs compatible with COPD. Calcified granuloma in the left upper lobe. Biapical scarring. No acute confluent opacities. Heart is normal size. No acute bony abnormality. IMPRESSION:  COPD/chronic changes.  No active disease. Electronically Signed   By: Rolm Baptise M.D.   On: 04/30/2020 19:13   CT Head Wo Contrast  Result Date: 04/30/2020 CLINICAL DATA:  Dizziness, left hand numbness EXAM: CT HEAD WITHOUT CONTRAST TECHNIQUE: Contiguous axial images were obtained from the base of the skull through the vertex without intravenous contrast. COMPARISON:  None. FINDINGS: Brain: No acute intracranial abnormality. Specifically, no hemorrhage, hydrocephalus, mass lesion, acute infarction, or significant intracranial injury. Vascular: No hyperdense vessel or unexpected  calcification. Skull: No acute calvarial abnormality. Sinuses/Orbits: Visualized paranasal sinuses and mastoids clear. Orbital soft tissues unremarkable. Other: None IMPRESSION: No acute intracranial abnormality. Electronically Signed   By: Rolm Baptise M.D.   On: 04/30/2020 19:30   CT Angio Neck W and/or Wo Contrast  Result Date: 05/01/2020 CLINICAL DATA:  Weakness, dizziness and left hand numbness EXAM: CT ANGIOGRAPHY HEAD AND NECK TECHNIQUE: Multidetector CT imaging of the head and neck was performed using the standard protocol during bolus administration of intravenous contrast. Multiplanar CT image reconstructions and MIPs were obtained to evaluate the vascular anatomy. Carotid stenosis measurements (when applicable) are obtained utilizing NASCET criteria, using the distal internal carotid diameter as the denominator. CONTRAST:  172mL OMNIPAQUE IOHEXOL 350 MG/ML SOLN COMPARISON:  None. FINDINGS: CT HEAD FINDINGS Brain: There is no mass, hemorrhage or extra-axial collection. The size and configuration of the ventricles and extra-axial CSF spaces are normal. There is no acute or chronic infarction. The brain parenchyma is normal. Skull: The visualized skull base, calvarium and extracranial soft tissues are normal. Sinuses/Orbits: No fluid levels or advanced mucosal thickening of the visualized paranasal sinuses. No mastoid or middle ear effusion. The orbits are normal. CTA NECK FINDINGS SKELETON: There is no bony spinal canal stenosis. No lytic or blastic lesion. OTHER NECK: Normal pharynx, larynx and major salivary glands. No cervical lymphadenopathy. Unremarkable thyroid gland. UPPER CHEST: No pneumothorax or pleural effusion. No nodules or masses. AORTIC ARCH: There is calcific atherosclerosis of the aortic arch. There is no aneurysm, dissection or hemodynamically significant stenosis of the visualized portion of the aorta. Conventional 3 vessel aortic branching pattern. The visualized proximal subclavian  arteries are widely patent. RIGHT CAROTID SYSTEM: No dissection, occlusion or aneurysm. There is mixed density atherosclerosis extending into the proximal ICA, resulting in 50% stenosis. LEFT CAROTID SYSTEM: No dissection, occlusion or aneurysm. Mild atherosclerotic calcification at the carotid bifurcation without hemodynamically significant stenosis. VERTEBRAL ARTERIES: Left dominant configuration. Both origins are clearly patent. There is no dissection, occlusion or flow-limiting stenosis to the skull base (V1-V3 segments). CTA HEAD FINDINGS POSTERIOR CIRCULATION: --Vertebral arteries: Normal V4 segments. --Inferior cerebellar arteries: Normal. --Basilar artery: Normal. --Superior cerebellar arteries: Normal. --Posterior cerebral arteries (PCA): Normal. ANTERIOR CIRCULATION: --Intracranial internal carotid arteries: Normal. --Anterior cerebral arteries (ACA): Normal. Both A1 segments are present. Patent anterior communicating artery (a-comm). --Middle cerebral arteries (MCA): Normal. VENOUS SINUSES: As permitted by contrast timing, patent. ANATOMIC VARIANTS: None Review of the MIP images confirms the above findings. IMPRESSION: 1. No intracranial arterial occlusion or high-grade stenosis. 2. 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 Aortic Atherosclerosis (ICD10-I70.0). Electronically Signed   By: Ulyses Jarred M.D.   On: 05/01/2020 01:10   MR BRAIN WO CONTRAST  Result Date: 04/30/2020 CLINICAL DATA:  Dizziness with left hand numbness EXAM: MRI HEAD WITHOUT CONTRAST TECHNIQUE: Multiplanar, multiecho pulse sequences of the brain and surrounding structures were obtained without intravenous contrast. COMPARISON:  None. FINDINGS: Brain: Small acute  infarct involving the right precentral and postcentral gyri. No other abnormal diffusion restriction. No acute or chronic hemorrhage. There is multifocal hyperintense T2-weighted signal within  the white matter. Parenchymal volume and CSF spaces are normal. The midline structures are normal. Vascular: Major flow voids are preserved. Skull and upper cervical spine: Normal calvarium and skull base. Visualized upper cervical spine and soft tissues are normal. Sinuses/Orbits:No paranasal sinus fluid levels or advanced mucosal thickening. No mastoid or middle ear effusion. Normal orbits. IMPRESSION: 1. 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 No hemorrhage or mass effect. 2. Findings of chronic small vessel ischemia. Electronically Signed   By: Ulyses Jarred M.D.   On: 04/30/2020 22:34      Scheduled Meds: .  stroke: mapping our early stages of recovery book   Does not apply Once  . aspirin  81 mg Oral Daily  . atorvastatin  40 mg Oral Daily  . clopidogrel  75 mg Oral Daily  . hydroxychloroquine  200 mg Oral Daily  . loratadine  10 mg Oral Daily  . thyroid  30 mg Oral Daily   Continuous Infusions:   LOS: 0 days      Time spent: 40 minutes   Wendy Phi, DO [facility] 05/01/2020, 8:35 AM   Available via Epic secure chat 7am-7pm After these hours, please refer to coverage provider listed on [email or web address]

## 2020-05-01 NOTE — Progress Notes (Signed)
OT Cancellation Note  Patient Details Name: Wendy Jackson MRN: [id number] DOB: 1945-08-10   Cancelled Treatment:    Reason Eval/Treat Not Completed: Other (comment) Patient transferring to [facility], will defer eval at this time.  Delbert Phenix OT OT pager: [phone]   Rosemary Holms 05/01/2020, 9:03 AM

## 2020-05-01 NOTE — ED Notes (Signed)
[facility] called for transport 

## 2020-05-01 NOTE — ED Notes (Signed)
Patient is resting comfortably. 

## 2020-05-01 NOTE — ED Notes (Signed)
Report called to [id number].

## 2020-05-01 NOTE — Consult Note (Signed)
NEURO HOSPITALIST CONSULT NOTE   Requestig physician: Dr. Velia Meyer  Reason for Consult: New onset of left hand weakness and numbness  History obtained from:  Patient, Husband and Chart     HPI:                                                                                                                                          Wendy Jackson is an 75 y.o. female presenting to the ED for evaluation of new onset left hand numbness and weakness with presyncopal sensation and unsteady gait. The patient first noticed the symptoms about 2-3 days ago, followed by resolution. The symptoms returned on Monday afternoon, so she went to the ED for evaluation. 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 The patient states that she has had a diagnosis of lupus in the past due to a positive blood test and right upper extremity arthritis symptoms, but that this diagnosis was called into question after a subsequent blood test came back negative.   Past Medical History:  Diagnosis Date  . Lupus ([place])   . Thyroid disease     Past Surgical History:  Procedure Laterality Date  . EYE SURGERY      Family History  Problem Relation Age of Onset  . ALS Mother   . Dementia Father               Social History:  reports that she has never smoked. She has never used smokeless tobacco. She reports that she does not drink alcohol and does not use drugs.  Allergies  Allergen Reactions  . Augmentin [Amoxicillin-Pot Clavulanate] Other (See Comments)    unk  . Tramadol     Nausea   . Flexeril [Cyclobenzaprine] Palpitations    MEDICATIONS:                                                                                                                     No current facility-administered medications on file prior to encounter.   Current Outpatient Medications on File Prior to Encounter  Medication Sig Dispense Refill  . ARMOUR  THYROID 30 MG tablet Take 1 tablet (30 mg total) by mouth daily. 90 tablet 3  . cetirizine (ZYRTEC) 10 MG  chewable tablet Chew 10 mg by mouth daily.    . Cholecalciferol (VITAMIN D3) 1.25 MG (50000 UT) TABS Take 5,000 Units by mouth daily.    Marland Kitchen HAWTHORNE PO Take 1 tablet by mouth daily.    . hydroxychloroquine (PLAQUENIL) 200 MG tablet Take 200 mg by mouth daily.    Marland Kitchen levOCARNitine L-Tartrate (L-CARNITINE) 500 MG CAPS Take 500 mg by mouth daily.    Marland Kitchen LUMIGAN 0.01 % SOLN Place 1 drop into both eyes at bedtime.    . Magnesium Citrate 100 MG TABS Take 150 mg by mouth. Pt taking 1.5 dose    . Misc Natural Products (AIRBORNE ELDERBERRY) CHEW Chew 1 tablet by mouth in the morning and at bedtime.    . Misc Natural Products (CURCUMAX PRO PO) Take 250 mg by mouth daily.    . Probiotic Product (UP4 PROBIOTICS WOMENS PO) Take 1 capsule by mouth daily.    Marland Kitchen Resveratrol 50 MG CAPS Take 50 mg by mouth daily.    Marland Kitchen SIMBRINZA 1-0.2 % SUSP Place 1 drop into both eyes in the morning, at noon, and at bedtime.    Marland Kitchen Ubiquinol 100 MG CAPS Take 100 mg by mouth.    Marland Kitchen VITAMIN K PO Take 180 mcg by mouth daily.       ROS:                                                                                                                                       As per HPI. Does not endorse any aphasia, new onset vision problems (has chronic vision problems), facial droop, headache or leg weakness.   Blood pressure 134/85, pulse 73, temperature 98.3 F (36.8 C), temperature source Oral, resp. rate 15, height 5\' 4"  (1.626 m), weight 45.4 kg, SpO2 98 %.   General Examination:                                                                                                       Physical Exam  HEENT-  [address]/AT    Lungs-Respirations unlabored Extremities- No edema  Neurological Examination Mental Status: Alert, fully oriented, thought content appropriate.  Speech fluent without evidence of aphasia.  Able to follow all commands  without difficulty. No dysarthria.  Cranial Nerves: II: Temporal visual fields intact with no extinction to DSS. Pupils are equal.  III,IV, VI: EOMI. No ptosis. No nystagmus.  V,VII: Smile symmetric, facial temp sensation equal bilaterally VIII: hearing intact to voice IX,X: No hypophonia  XI: Symmetric shoulder shrug XII: Midline tongue extension Motor: RUE and RLE 5/5 LUE 5/5 except for 4/5 finger abduction LLE 5/5 Sensory: Temp and light touch intact throughout, bilaterally, including the hands. No extinction to DSS.  Deep Tendon Reflexes: 3+ right biceps and brachioradialis. 2+ left biceps and brachioradialis. 3+ bilateral patellae. 2+ bilateral achilles. Positive Hoffman's sign on the left.  Plantars: Tonically upgoing bilaterally.  Cerebellar: No ataxia with FNF bilaterally. Right H-S normal. Left H-S with mild ataxia.  Gait: Deferred   Lab Results: Basic Metabolic Panel: Recent Labs  Lab 04/30/20 1844  NA 139  K 3.9  CL 103  CO2 26  GLUCOSE 111*  BUN 18  CREATININE 0.65  CALCIUM 8.9    CBC: Recent Labs  Lab 04/30/20 1844  WBC 6.2  HGB 13.3  HCT 40.8  MCV 96.7  PLT 199    Cardiac Enzymes: No results for input(s): CKTOTAL, CKMB, CKMBINDEX, TROPONINI in the last 168 hours.  Lipid Panel: No results for input(s): CHOL, TRIG, HDL, CHOLHDL, VLDL, LDLCALC in the last 168 hours.  Imaging: CT Angio Head W or Wo Contrast  Result Date: 05/01/2020 CLINICAL DATA:  Weakness, dizziness and left hand numbness EXAM: CT ANGIOGRAPHY HEAD AND NECK TECHNIQUE: Multidetector CT imaging of the head and neck was performed using the standard protocol during bolus administration of intravenous contrast. Multiplanar CT image reconstructions and MIPs were obtained to evaluate the vascular anatomy. Carotid stenosis measurements (when applicable) are obtained utilizing NASCET criteria, using the distal internal carotid diameter as the denominator. CONTRAST:  177mL OMNIPAQUE IOHEXOL 350  MG/ML SOLN COMPARISON:  None. FINDINGS: CT HEAD FINDINGS Brain: There is no mass, hemorrhage or extra-axial collection. The size and configuration of the ventricles and extra-axial CSF spaces are normal. There is no acute or chronic infarction. The brain parenchyma is normal. Skull: The visualized skull base, calvarium and extracranial soft tissues are normal. Sinuses/Orbits: No fluid levels or advanced mucosal thickening of the visualized paranasal sinuses. No mastoid or middle ear effusion. The orbits are normal. CTA NECK FINDINGS SKELETON: There is no bony spinal canal stenosis. No lytic or blastic lesion. OTHER NECK: Normal pharynx, larynx and major salivary glands. No cervical lymphadenopathy. Unremarkable thyroid gland. UPPER CHEST: No pneumothorax or pleural effusion. No nodules or masses. AORTIC ARCH: There is calcific atherosclerosis of the aortic arch. There is no aneurysm, dissection or hemodynamically significant stenosis of the visualized portion of the aorta. Conventional 3 vessel aortic branching pattern. The visualized proximal subclavian arteries are widely patent. RIGHT CAROTID SYSTEM: No dissection, occlusion or aneurysm. There is mixed density atherosclerosis extending into the proximal ICA, resulting in 50% stenosis. LEFT CAROTID SYSTEM: No dissection, occlusion or aneurysm. Mild atherosclerotic calcification at the carotid bifurcation without hemodynamically significant stenosis. VERTEBRAL ARTERIES: Left dominant configuration. Both origins are clearly patent. There is no dissection, occlusion or flow-limiting stenosis to the skull base (V1-V3 segments). CTA HEAD FINDINGS POSTERIOR CIRCULATION: --Vertebral arteries: Normal V4 segments. --Inferior cerebellar arteries: Normal. --Basilar artery: Normal. --Superior cerebellar arteries: Normal. --Posterior cerebral arteries (PCA): Normal. ANTERIOR CIRCULATION: --Intracranial internal carotid arteries: Normal. --Anterior cerebral arteries (ACA):  Normal. Both A1 segments are present. Patent anterior communicating artery (a-comm). --Middle cerebral arteries (MCA): Normal. VENOUS SINUSES: As permitted by contrast timing, patent. ANATOMIC VARIANTS: None Review of the MIP images confirms the above findings. IMPRESSION: 1. No intracranial arterial occlusion or high-grade stenosis. 2. 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 Aortic Atherosclerosis (ICD10-I70.0). Electronically  Signed   By: Ulyses Jarred M.D.   On: 05/01/2020 01:10   DG Chest 2 View  Result Date: 04/30/2020 CLINICAL DATA:  Numbness, left hand weakness EXAM: CHEST - 2 VIEW COMPARISON:  None. FINDINGS: There is hyperinflation of the lungs compatible with COPD. Calcified granuloma in the left upper lobe. Biapical scarring. No acute confluent opacities. Heart is normal size. No acute bony abnormality. IMPRESSION: COPD/chronic changes.  No active disease. Electronically Signed   By: Rolm Baptise M.D.   On: 04/30/2020 19:13   CT Head Wo Contrast  Result Date: 04/30/2020 CLINICAL DATA:  Dizziness, left hand numbness EXAM: CT HEAD WITHOUT CONTRAST TECHNIQUE: Contiguous axial images were obtained from the base of the skull through the vertex without intravenous contrast. COMPARISON:  None. FINDINGS: Brain: No acute intracranial abnormality. Specifically, no hemorrhage, hydrocephalus, mass lesion, acute infarction, or significant intracranial injury. Vascular: No hyperdense vessel or unexpected calcification. Skull: No acute calvarial abnormality. Sinuses/Orbits: Visualized paranasal sinuses and mastoids clear. Orbital soft tissues unremarkable. Other: None IMPRESSION: No acute intracranial abnormality. Electronically Signed   By: Rolm Baptise M.D.   On: 04/30/2020 19:30   CT Angio Neck W and/or Wo Contrast  Result Date: 05/01/2020 CLINICAL DATA:  Weakness, dizziness and left hand numbness EXAM: CT ANGIOGRAPHY HEAD AND  NECK TECHNIQUE: Multidetector CT imaging of the head and neck was performed using the standard protocol during bolus administration of intravenous contrast. Multiplanar CT image reconstructions and MIPs were obtained to evaluate the vascular anatomy. Carotid stenosis measurements (when applicable) are obtained utilizing NASCET criteria, using the distal internal carotid diameter as the denominator. CONTRAST:  134mL OMNIPAQUE IOHEXOL 350 MG/ML SOLN COMPARISON:  None. FINDINGS: CT HEAD FINDINGS Brain: There is no mass, hemorrhage or extra-axial collection. The size and configuration of the ventricles and extra-axial CSF spaces are normal. There is no acute or chronic infarction. The brain parenchyma is normal. Skull: The visualized skull base, calvarium and extracranial soft tissues are normal. Sinuses/Orbits: No fluid levels or advanced mucosal thickening of the visualized paranasal sinuses. No mastoid or middle ear effusion. The orbits are normal. CTA NECK FINDINGS SKELETON: There is no bony spinal canal stenosis. No lytic or blastic lesion. OTHER NECK: Normal pharynx, larynx and major salivary glands. No cervical lymphadenopathy. Unremarkable thyroid gland. UPPER CHEST: No pneumothorax or pleural effusion. No nodules or masses. AORTIC ARCH: There is calcific atherosclerosis of the aortic arch. There is no aneurysm, dissection or hemodynamically significant stenosis of the visualized portion of the aorta. Conventional 3 vessel aortic branching pattern. The visualized proximal subclavian arteries are widely patent. RIGHT CAROTID SYSTEM: No dissection, occlusion or aneurysm. There is mixed density atherosclerosis extending into the proximal ICA, resulting in 50% stenosis. LEFT CAROTID SYSTEM: No dissection, occlusion or aneurysm. Mild atherosclerotic calcification at the carotid bifurcation without hemodynamically significant stenosis. VERTEBRAL ARTERIES: Left dominant configuration. Both origins are clearly patent.  There is no dissection, occlusion or flow-limiting stenosis to the skull base (V1-V3 segments). CTA HEAD FINDINGS POSTERIOR CIRCULATION: --Vertebral arteries: Normal V4 segments. --Inferior cerebellar arteries: Normal. --Basilar artery: Normal. --Superior cerebellar arteries: Normal. --Posterior cerebral arteries (PCA): Normal. ANTERIOR CIRCULATION: --Intracranial internal carotid arteries: Normal. --Anterior cerebral arteries (ACA): Normal. Both A1 segments are present. Patent anterior communicating artery (a-comm). --Middle cerebral arteries (MCA): Normal. VENOUS SINUSES: As permitted by contrast timing, patent. ANATOMIC VARIANTS: None Review of the MIP images confirms the above findings. IMPRESSION: 1. No intracranial arterial occlusion or high-grade stenosis. 2. Approximately 50% stenosis of the proximal right internal  carotid artery secondary to mixed density atherosclerosis. Aortic Atherosclerosis (ICD10-I70.0). Electronically Signed   By: Ulyses Jarred M.D.   On: 05/01/2020 01:10   MR BRAIN WO CONTRAST  Result Date: 04/30/2020 CLINICAL DATA:  Dizziness with left hand numbness EXAM: MRI HEAD WITHOUT CONTRAST TECHNIQUE: Multiplanar, multiecho pulse sequences of the brain and surrounding structures were obtained without intravenous contrast. COMPARISON:  None. 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 No other abnormal diffusion restriction. No acute or chronic hemorrhage. There is multifocal hyperintense T2-weighted signal within the white matter. Parenchymal volume and CSF spaces are normal. The midline structures are normal. Vascular: Major flow voids are preserved. Skull and upper cervical spine: Normal calvarium and skull base. Visualized upper cervical spine and soft tissues are normal. Sinuses/Orbits:No paranasal sinus fluid levels or advanced mucosal thickening. No mastoid or middle ear effusion. Normal orbits. IMPRESSION: 1. Small acute infarct  involving the right precentral and postcentral gyri. No hemorrhage or mass effect. 2. Findings of chronic small vessel ischemia. Electronically Signed   By: Ulyses Jarred M.D.   On: 04/30/2020 22:34    Assessment: 75 year old female with acute small right MCA territory strokes.  1. Exam reveals subtle weakness of the left hand.  2. MRI brain: 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. No hemorrhage or mass effect. Findings of chronic small vessel ischemia. 3. CTA of head and neck:  No intracranial arterial occlusion or high-grade stenosis. 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 Aortic Atherosclerosis  4. Stroke risk factors: Questionable diagnosis of lupus  Recommendations: 1. Carotid ultrasound to further assess the right ICA stenosis seen on CTA. May need vascular surgery consult pending results.  2. TTE 3. Cardiac telemetry 4. ASA 81 mg po qd 5. Statin 6. BP management. Out of the permissive HTN time window.  7. PT/OT/Speech   Electronically signed: Dr. Kerney Elbe 05/01/2020, 4:18 AM

## 2020-05-01 NOTE — ED Notes (Signed)
Pt ambulated to the restroom.

## 2020-05-01 NOTE — ED Notes (Signed)
Attempted to call report to [id number] [phone].

## 2020-05-01 NOTE — Progress Notes (Addendum)
STROKE TEAM PROGRESS NOTE   INTERVAL HISTORY No acute events overnight.  Patient reports her left hand numbness has completely resolved. She denies any new symptoms including numbness, weakness, speech difficulty or difficulty walking. She is feeling back to normal.   We discussed her stroke diagnosis, diagnostic findings and ongoing stroke work up and plan of care. Questions addressed.    Vitals:   05/01/20 0600 05/01/20 0726 05/01/20 0800 05/01/20 1040  BP: 135/62 (!) 149/77 (!) 157/66 (!) 158/84  Pulse: 66 71 68 88  Resp: 14 12 10 20   Temp:    97.9 F (36.6 C)  TempSrc:    Oral  SpO2: 99% 99% 99% 100%  Weight:      Height:       CBC:  Recent Labs  Lab 04/30/20 1844 05/01/20 0440  WBC 6.2 6.1  HGB 13.3 12.3  HCT 40.8 37.4  MCV 96.7 94.9  PLT 199 426   Basic Metabolic Panel:  Recent Labs  Lab 04/30/20 1844 05/01/20 0440  NA 139 138  K 3.9 3.7  CL 103 105  CO2 26 26  GLUCOSE 111* 91  BUN 18 13  CREATININE 0.65 0.44  CALCIUM 8.9 8.9  MG  --  2.1   Lipid Panel:  Recent Labs  Lab 05/01/20 0440  CHOL 188  TRIG 36  HDL 88  CHOLHDL 2.1  VLDL 7  LDLCALC 93   HgbA1c:  Recent Labs  Lab 05/01/20 0440  HGBA1C 5.5   Urine Drug Screen: No results for input(s): LABOPIA, COCAINSCRNUR, LABBENZ, AMPHETMU, THCU, LABBARB in the last 168 hours.  Alcohol Level No results for input(s): ETH in the last 168 hours.  IMAGING past 24 hours CT Angio Head W or Wo Contrast  Result Date: 05/01/2020 CLINICAL DATA:  Weakness, dizziness and left hand numbness EXAM: CT ANGIOGRAPHY HEAD AND NECK TECHNIQUE: Multidetector CT imaging of the head and neck was performed using the standard protocol during bolus administration of intravenous contrast. Multiplanar CT image reconstructions and MIPs were obtained to evaluate the vascular anatomy. Carotid stenosis measurements (when applicable) are obtained utilizing NASCET criteria, using the distal internal carotid diameter as the  denominator. CONTRAST:  165mL OMNIPAQUE IOHEXOL 350 MG/ML SOLN COMPARISON:  None. FINDINGS: CT HEAD FINDINGS Brain: There is no mass, hemorrhage or extra-axial collection. The size and configuration of the ventricles and extra-axial CSF spaces are normal. There is no acute or chronic infarction. The brain parenchyma is normal. Skull: The visualized skull base, calvarium and extracranial soft tissues are normal. Sinuses/Orbits: No fluid levels or advanced mucosal thickening of the visualized paranasal sinuses. No mastoid or middle ear effusion. The orbits are normal. CTA NECK FINDINGS SKELETON: There is no bony spinal canal stenosis. No lytic or blastic lesion. OTHER NECK: Normal pharynx, larynx and major salivary glands. No cervical lymphadenopathy. Unremarkable thyroid gland. UPPER CHEST: No pneumothorax or pleural effusion. No nodules or masses. AORTIC ARCH: There is calcific atherosclerosis of the aortic arch. There is no aneurysm, dissection or hemodynamically significant stenosis of the visualized portion of the aorta. Conventional 3 vessel aortic branching pattern. The visualized proximal subclavian arteries are widely patent. RIGHT CAROTID SYSTEM: No dissection, occlusion or aneurysm. There is mixed density atherosclerosis extending into the proximal ICA, resulting in 50% stenosis. LEFT CAROTID SYSTEM: No dissection, occlusion or aneurysm. Mild atherosclerotic calcification at the carotid bifurcation without hemodynamically significant stenosis. VERTEBRAL ARTERIES: Left dominant configuration. Both origins are clearly patent. There is no dissection, occlusion or flow-limiting stenosis to the  skull base (V1-V3 segments). CTA HEAD FINDINGS POSTERIOR CIRCULATION: --Vertebral arteries: Normal V4 segments. --Inferior cerebellar arteries: Normal. --Basilar artery: Normal. --Superior cerebellar arteries: Normal. --Posterior cerebral arteries (PCA): Normal. ANTERIOR CIRCULATION: --Intracranial internal carotid  arteries: Normal. --Anterior cerebral arteries (ACA): Normal. Both A1 segments are present. Patent anterior communicating artery (a-comm). --Middle cerebral arteries (MCA): Normal. VENOUS SINUSES: As permitted by contrast timing, patent. ANATOMIC VARIANTS: None Review of the MIP images confirms the above findings. IMPRESSION: 1. No intracranial arterial occlusion or high-grade stenosis. 2. 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 Aortic Atherosclerosis (ICD10-I70.0). Electronically Signed   By: Ulyses Jarred M.D.   On: 05/01/2020 01:10   DG Chest 2 View  Result Date: 04/30/2020 CLINICAL DATA:  Numbness, left hand weakness EXAM: CHEST - 2 VIEW COMPARISON:  None. FINDINGS: There is hyperinflation of the lungs compatible with COPD. Calcified granuloma in the left upper lobe. Biapical scarring. No acute confluent opacities. Heart is normal size. No acute bony abnormality. IMPRESSION: COPD/chronic changes.  No active disease. Electronically Signed   By: Rolm Baptise M.D.   On: 04/30/2020 19:13   CT Head Wo Contrast  Result Date: 04/30/2020 CLINICAL DATA:  Dizziness, left hand numbness EXAM: CT HEAD WITHOUT CONTRAST TECHNIQUE: Contiguous axial images were obtained from the base of the skull through the vertex without intravenous contrast. COMPARISON:  None. FINDINGS: Brain: No acute intracranial abnormality. Specifically, no hemorrhage, hydrocephalus, mass lesion, acute infarction, or significant intracranial injury. Vascular: No hyperdense vessel or unexpected calcification. Skull: No acute calvarial abnormality. Sinuses/Orbits: Visualized paranasal sinuses and mastoids clear. Orbital soft tissues unremarkable. Other: None IMPRESSION: No acute intracranial abnormality. Electronically Signed   By: Rolm Baptise M.D.   On: 04/30/2020 19:30   CT Angio Neck W and/or Wo Contrast  Result Date: 05/01/2020 CLINICAL DATA:  Weakness, dizziness  and left hand numbness EXAM: CT ANGIOGRAPHY HEAD AND NECK TECHNIQUE: Multidetector CT imaging of the head and neck was performed using the standard protocol during bolus administration of intravenous contrast. Multiplanar CT image reconstructions and MIPs were obtained to evaluate the vascular anatomy. Carotid stenosis measurements (when applicable) are obtained utilizing NASCET criteria, using the distal internal carotid diameter as the denominator. CONTRAST:  178mL OMNIPAQUE IOHEXOL 350 MG/ML SOLN COMPARISON:  None. FINDINGS: CT HEAD FINDINGS Brain: There is no mass, hemorrhage or extra-axial collection. The size and configuration of the ventricles and extra-axial CSF spaces are normal. There is no acute or chronic infarction. The brain parenchyma is normal. Skull: The visualized skull base, calvarium and extracranial soft tissues are normal. Sinuses/Orbits: No fluid levels or advanced mucosal thickening of the visualized paranasal sinuses. No mastoid or middle ear effusion. The orbits are normal. CTA NECK FINDINGS SKELETON: There is no bony spinal canal stenosis. No lytic or blastic lesion. OTHER NECK: Normal pharynx, larynx and major salivary glands. No cervical lymphadenopathy. Unremarkable thyroid gland. UPPER CHEST: No pneumothorax or pleural effusion. No nodules or masses. AORTIC ARCH: There is calcific atherosclerosis of the aortic arch. There is no aneurysm, dissection or hemodynamically significant stenosis of the visualized portion of the aorta. Conventional 3 vessel aortic branching pattern. The visualized proximal subclavian arteries are widely patent. RIGHT CAROTID SYSTEM: No dissection, occlusion or aneurysm. There is mixed density atherosclerosis extending into the proximal ICA, resulting in 50% stenosis. LEFT CAROTID SYSTEM: No dissection, occlusion or aneurysm. Mild atherosclerotic calcification at the carotid bifurcation without hemodynamically significant stenosis. VERTEBRAL ARTERIES: Left  dominant configuration. Both origins are  clearly patent. There is no dissection, occlusion or flow-limiting stenosis to the skull base (V1-V3 segments). CTA HEAD FINDINGS POSTERIOR CIRCULATION: --Vertebral arteries: Normal V4 segments. --Inferior cerebellar arteries: Normal. --Basilar artery: Normal. --Superior cerebellar arteries: Normal. --Posterior cerebral arteries (PCA): Normal. ANTERIOR CIRCULATION: --Intracranial internal carotid arteries: Normal. --Anterior cerebral arteries (ACA): Normal. Both A1 segments are present. Patent anterior communicating artery (a-comm). --Middle cerebral arteries (MCA): Normal. VENOUS SINUSES: As permitted by contrast timing, patent. ANATOMIC VARIANTS: None Review of the MIP images confirms the above findings. IMPRESSION: 1. No intracranial arterial occlusion or high-grade stenosis. 2. 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 Aortic Atherosclerosis (ICD10-I70.0). Electronically Signed   By: Ulyses Jarred M.D.   On: 05/01/2020 01:10   MR BRAIN WO CONTRAST  Result Date: 04/30/2020 CLINICAL DATA:  Dizziness with left hand numbness EXAM: MRI HEAD WITHOUT CONTRAST TECHNIQUE: Multiplanar, multiecho pulse sequences of the brain and surrounding structures were obtained without intravenous contrast. COMPARISON:  None. 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 No other abnormal diffusion restriction. No acute or chronic hemorrhage. There is multifocal hyperintense T2-weighted signal within the white matter. Parenchymal volume and CSF spaces are normal. The midline structures are normal. Vascular: Major flow voids are preserved. Skull and upper cervical spine: Normal calvarium and skull base. Visualized upper cervical spine and soft tissues are normal. Sinuses/Orbits:No paranasal sinus fluid levels or advanced mucosal thickening. No mastoid or middle ear effusion.  Normal orbits. IMPRESSION: 1. 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 No hemorrhage or mass effect. 2. Findings of chronic small vessel ischemia. Electronically Signed   By: Ulyses Jarred M.D.   On: 04/30/2020 22:34    PHYSICAL EXAM Gen: 75 y.o. female sitting up in bed watching tv in NAD HEENT-  [place]/AT    Resp-No extra work of breathing Extremities- No edema Skin: warm and dry   Neurological Examination Mental Status: A&Ox4  Speech fluent without evidence of aphasia.  Able to follow all commands without difficulty. No dysarthria.  Cranial Nerves: II: Temporal visual fields intact with no extinction to DSS. Pupils are equal.  III,IV, VI: EOMI. No ptosis. No nystagmus.  V,VII: Smile symmetric, facial temp sensation equal bilaterally VIII: hearing intact to voice IX,X: No hypophonia XI: Symmetric shoulder shrug XII: Midline tongue extension Motor: RUE and RLE 5/5 LUE 5/5  LLE 5/5 Sensory:  Light touch intact throughout, bilaterally, including the hands. No extinction to DSS.   Plantars: Tonically upgoing bilaterally.  Cerebellar: No ataxia with FNF bilaterally. Right H-S normal. Left H-S with mild ataxia.  Gait: Deferred  ASSESSMENT/PLAN Wendy Jackson is an 75 y.o. female with PMH of lupus and thyroid disease presenting to the ED for evaluation of new onset left hand numbness and weakness with presyncopal sensation and unsteady gait. The patient first noticed the symptoms about 2-3 days ago, followed by resolution. The symptoms returned on Monday afternoon, so she went to the ED for evaluation. 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 The patient states that she has had a diagnosis of lupus in the past due to a positive blood test and right upper extremity arthritis symptoms, but that this diagnosis was called into question after a subsequent blood test came back negative. Small acute infarct  involving the right precentral and postcentral gyri. No hemorrhage or mass effect.  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,  emboli pattern, most likely due to right ICA stenosis with high risk plaque  2D Echo pending  LE venous Doppler pending  Hypercoag labs are pending   Recommend 30 day cardiac event monitoring as outpt to rule out afib  VTE prophylaxis - is recommended  Passed swallow eval  No antithrombotic or anticoagulant prior to admission   Now on DAPT w/ASA 81mg  and Plavix 75 mg daily x 3 weeks then ASA 81mg  alone  Therapy recommendations:  Pending-but no new deficits, has returned to baseline  Disposition:  Home   Right carotid stenosis  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 Permissive hypertension (OK if < 220/120) but gradually normalize in 5-7 days . Long-term BP goal normotensive  Hyperlipidemia  Home meds:  None  LDL 93, goal < 70  Lipitor 40 mg daily added   Continue statin at discharge  Other Stroke Risk Factors  Advanced Age >/= 25   ?Lupus: on plaquenil - pt rheumatologist does not believe pt has lupus, there is plan to taper off plaquenil per pt  Hospital day # 0 This plan of care was directed by Dr. Erlinda Hong.  [employer]  ATTENDING NOTE: I reviewed above note and agree with the assessment and plan. Pt was seen and examined.   75 year old female with history of questionable lupus on Plaquenil admitted for left hand numbness and weakness, dizziness and difficulty with gait.  CT no acute abnormality.  MRI showed right precentral and postcentral gyrus small infarct, embolic pattern.  CTA head and neck no LVO but right ICA 50% stenosis with high risk mixed soft and calcified plaque.  EF 60 to 65%.  LE venous Doppler pending, hypercoagulable work-up  pending.  LDL 93, A1c 5.5.  Creatinine 0.65.  On exam, patient husband at bedside, patient sitting in bed, awake alert, fully orientated, no aphasia, fluent speech, follows all simple commands.  Neuro exam showed no focal deficit, moving all extremities, sensation symmetrical.  Etiology for patient stroke most likely due to right ICA stenosis with high risk mixed soft and calcified plaque.  Vascular surgery consulted, discussed with Dr. Donnetta Hutching, he agreed with high risk plaque likely to be the cause, will plan for right CEA tomorrow.  However, cardioembolic source cannot be completely ruled out at the time, LE venous Doppler pending, recommend 30-day cardiac event monitoring as outpatient to rule out A. fib.  Patient has vague history of lupus, per patient, her rheumatologist does not believe to lupus, plan to discontinue the Plaquenil.  Anyway, will do hypercoagulable work-up which still pending.  Continue aspirin Plavix DAPT for 3 weeks and then aspirin alone, continue Lipitor 40.  Will follow.  For detailed assessment and plan, please refer to above as I have made changes wherever appropriate.   Rosalin Hawking, MD PhD Stroke Neurology 05/01/2020 6:30 PM     To contact Stroke Continuity provider, please refer to [email or web address] After hours, contact General Neurology

## 2020-05-01 NOTE — H&P (View-Only) (Signed)
[facility] [place]  Patient name: Wendy Jackson MRN: [id number] DOB: 1945/10/20 Sex: female   HPI: Wendy Jackson is a 75 y.o. female seen in consultation for right carotid stenosis and right brain stroke.  Patient otherwise healthy female who had episode 3 days ago of lightheadedness and weakness and numbness in her left hand.  She is right-handed.  She reports that she dropped several items.  She considered presenting to the emergency room but decided to wait till Monday to speak with her primary care physician.  This had resolved by Monday morning.  Throughout the day on Monday she began having recurrent similar symptoms and called her physician who requested that she report to the emergency department which she did.  Imaging included MRI which revealed small acute infarct involving the right precentral and postcentral gyri.  She then underwent CT angiogram of her head and neck.  This revealed irregular mixed plaque resulting in at least a 50% stenosis in her internal carotid artery.  She has no prior history of neurologic deficits.  She is otherwise healthy.  She has no major risk factors.  She is not a smoker.  Past Medical History:  Diagnosis Date  . Lupus ([place])   . Thyroid disease     Family History  Problem Relation Age of Onset  . ALS Mother   . Dementia Father     SOCIAL HISTORY: Social History   Tobacco Use  . Smoking status: Never Smoker  . Smokeless tobacco: Never Used  Substance Use Topics  . Alcohol use: Never    Allergies  Allergen Reactions  . Augmentin [Amoxicillin-Pot Clavulanate] Other (See Comments)    unk  . Tramadol     Nausea   . Flexeril [Cyclobenzaprine] Palpitations    Current Facility-Administered Medications  Medication Dose Route Frequency Provider Last Rate Last Admin  .  stroke: mapping our Meghan Warshawsky stages of recovery book   Does not apply Once Howerter, Justin B, DO      . acetaminophen  (TYLENOL) tablet 650 mg  650 mg Oral Q6H PRN Howerter, Justin B, DO       Or  . acetaminophen (TYLENOL) suppository 650 mg  650 mg Rectal Q6H PRN Howerter, Justin B, DO      . aspirin chewable tablet 81 mg  81 mg Oral Daily Howerter, Justin B, DO   81 mg at 05/01/20 0915  . atorvastatin (LIPITOR) tablet 40 mg  40 mg Oral Daily Howerter, Justin B, DO   40 mg at 05/01/20 0914  . clopidogrel (PLAVIX) tablet 75 mg  75 mg Oral Daily Rosalin Hawking, MD   75 mg at 05/01/20 0915  . hydroxychloroquine (PLAQUENIL) tablet 200 mg  200 mg Oral Daily Howerter, Justin B, DO   200 mg at 05/01/20 0915  . loratadine (CLARITIN) tablet 10 mg  10 mg Oral Daily Howerter, Justin B, DO   10 mg at 05/01/20 1230  . thyroid (ARMOUR) tablet 30 mg  30 mg Oral Daily Howerter, Justin B, DO        REVIEW OF SYSTEMS:  [X]  denotes positive finding, [ ]  denotes negative finding Cardiac  Comments:  Chest pain or chest pressure:    Shortness of breath upon exertion:    Short of breath when lying flat:    Irregular heart rhythm:        Vascular    Pain in calf, thigh, or hip brought on by ambulation:    Pain in feet  at night that wakes you up from your sleep:     Blood clot in your veins:    Leg swelling:           PHYSICAL EXAM: Vitals:   05/01/20 0600 05/01/20 0726 05/01/20 0800 05/01/20 1040  BP: 135/62 (!) 149/77 (!) 157/66 (!) 158/84  Pulse: 66 71 68 88  Resp: 14 12 10 20   Temp:    97.9 F (36.6 C)  TempSrc:    Oral  SpO2: 99% 99% 99% 100%  Weight:      Height:        GENERAL: The patient is a well-nourished female, in no acute distress. The vital signs are documented above. CARDIOVASCULAR: 2+ radial and 2+ dorsalis pedis pulses bilaterally PULMONARY: There is good air exchange  MUSCULOSKELETAL: There are no major deformities or cyanosis. NEUROLOGIC: No focal weakness or paresthesias are detected. SKIN: There are no ulcers or rashes noted. PSYCHIATRIC: The patient has a normal affect.  DATA:  CT  reveals mixed plaque in the internal carotid artery on the right resulting in at least 6 stenosis.  She has a relatively high bifurcation.  The internal carotid becomes normal above the bifurcation  2D echocardiogram is pending  MEDICAL ISSUES: Right brain stroke with moderate irregular plaque in the right internal carotid artery.  No other source for stroke noted.  2D echocardiogram is pending.  Long discussion with the patient regarding this.  Assuming the 2D echocardiogram shows no cardiogenic source, would recommend right carotid endarterectomy for reduction of stroke risk.  I explained the procedure in detail including location of the incision.  Also discussed 1 to 2% risk of stroke with surgery.  Patient reports that her husband had undergone carotid endarterectomy in [place] approximately 10 years ago so she is familiar with the procedure.  Since she has not had a major stroke, would feel comfortable proceeding with surgery tomorrow.  Will discuss with neurology.    Rosetta Posner, MD FACS [facility] [phone]  Note: Portions of this report may have been transcribed using voice recognition software.  Every effort has been made to ensure accuracy; however, inadvertent computerized transcription errors may still be present.

## 2020-05-01 NOTE — Plan of Care (Signed)
  Problem: Education: Goal: Knowledge of disease or condition will improve Outcome: Progressing Goal: Knowledge of secondary prevention will improve Outcome: Progressing Goal: Knowledge of patient specific risk factors addressed and post discharge goals established will improve Outcome: Progressing Goal: Individualized Educational Video(s) Outcome: Progressing   Problem: Coping: Goal: Will verbalize positive feelings about self Outcome: Progressing Goal: Will identify appropriate support needs Outcome: Progressing   Problem: Health Behavior/Discharge Planning: Goal: Ability to manage health-related needs will improve Outcome: Progressing   Problem: Self-Care: Goal: Ability to participate in self-care as condition permits will improve Outcome: Progressing

## 2020-05-01 NOTE — ED Notes (Signed)
Report given to [facility] 

## 2020-05-02 ENCOUNTER — Encounter (HOSPITAL_COMMUNITY)
Admission: EM | Disposition: A | Discharge status: Home / Self Care | Payer: Self-pay | Source: Home / Self Care | Attending: Internal Medicine

## 2020-05-02 ENCOUNTER — Observation Stay (HOSPITAL_COMMUNITY): Payer: Medicare Other

## 2020-05-02 ENCOUNTER — Encounter (HOSPITAL_COMMUNITY): Payer: Self-pay | Admitting: Internal Medicine

## 2020-05-02 ENCOUNTER — Observation Stay (HOSPITAL_COMMUNITY): Payer: Medicare Other | Admitting: Certified Registered"

## 2020-05-02 DIAGNOSIS — Z79899 Other long term (current) drug therapy: Secondary | ICD-10-CM | POA: Diagnosis not present

## 2020-05-02 DIAGNOSIS — Z79890 Hormone replacement therapy (postmenopausal): Secondary | ICD-10-CM | POA: Diagnosis not present

## 2020-05-02 DIAGNOSIS — I639 Cerebral infarction, unspecified: Secondary | ICD-10-CM | POA: Diagnosis not present

## 2020-05-02 DIAGNOSIS — I63231 Cerebral infarction due to unspecified occlusion or stenosis of right carotid arteries: Principal | ICD-10-CM

## 2020-05-02 DIAGNOSIS — Z820 Family history of epilepsy and other diseases of the nervous system: Secondary | ICD-10-CM | POA: Diagnosis not present

## 2020-05-02 DIAGNOSIS — Z20822 Contact with and (suspected) exposure to covid-19: Secondary | ICD-10-CM | POA: Diagnosis present

## 2020-05-02 DIAGNOSIS — M329 Systemic lupus erythematosus, unspecified: Secondary | ICD-10-CM | POA: Diagnosis present

## 2020-05-02 DIAGNOSIS — Z8673 Personal history of transient ischemic attack (TIA), and cerebral infarction without residual deficits: Secondary | ICD-10-CM | POA: Diagnosis present

## 2020-05-02 DIAGNOSIS — J309 Allergic rhinitis, unspecified: Secondary | ICD-10-CM | POA: Diagnosis present

## 2020-05-02 DIAGNOSIS — R29700 NIHSS score 0: Secondary | ICD-10-CM | POA: Diagnosis present

## 2020-05-02 DIAGNOSIS — E039 Hypothyroidism, unspecified: Secondary | ICD-10-CM | POA: Diagnosis present

## 2020-05-02 DIAGNOSIS — I10 Essential (primary) hypertension: Secondary | ICD-10-CM | POA: Diagnosis present

## 2020-05-02 DIAGNOSIS — E785 Hyperlipidemia, unspecified: Secondary | ICD-10-CM | POA: Diagnosis present

## 2020-05-02 DIAGNOSIS — R200 Anesthesia of skin: Secondary | ICD-10-CM | POA: Diagnosis present

## 2020-05-02 DIAGNOSIS — Z885 Allergy status to narcotic agent status: Secondary | ICD-10-CM | POA: Diagnosis not present

## 2020-05-02 DIAGNOSIS — G8324 Monoplegia of upper limb affecting left nondominant side: Secondary | ICD-10-CM | POA: Diagnosis present

## 2020-05-02 DIAGNOSIS — I6521 Occlusion and stenosis of right carotid artery: Secondary | ICD-10-CM | POA: Diagnosis not present

## 2020-05-02 DIAGNOSIS — Z888 Allergy status to other drugs, medicaments and biological substances status: Secondary | ICD-10-CM | POA: Diagnosis not present

## 2020-05-02 DIAGNOSIS — Z881 Allergy status to other antibiotic agents status: Secondary | ICD-10-CM | POA: Diagnosis not present

## 2020-05-02 DIAGNOSIS — Z791 Long term (current) use of non-steroidal anti-inflammatories (NSAID): Secondary | ICD-10-CM | POA: Diagnosis not present

## 2020-05-02 HISTORY — PX: ENDARTERECTOMY: SHX5162

## 2020-05-02 LAB — BETA-2-GLYCOPROTEIN I ABS, IGG/M/A
Beta-2 Glyco I IgG: <9 GPI IgG units (ref 0–20)
Beta-2-Glycoprotein I IgA: <9 GPI IgA units (ref 0–25)
Beta-2-Glycoprotein I IgM: <9 GPI IgM units (ref 0–32)

## 2020-05-02 LAB — BASIC METABOLIC PANEL
Anion gap: 8 (ref 5–15)
BUN: 15 mg/dL (ref 8–23)
CO2: 23 mmol/L (ref 22–32)
Calcium: 8.8 mg/dL — ABNORMAL LOW (ref 8.9–10.3)
Chloride: 104 mmol/L (ref 98–111)
Creatinine, Ser: 0.67 mg/dL (ref 0.44–1.00)
GFR, Estimated: >60 mL/min (ref >60)
Glucose, Bld: 78 mg/dL (ref 70–99)
Potassium: 3.4 mmol/L — ABNORMAL LOW (ref 3.5–5.1)
Sodium: 135 mmol/L (ref 135–145)

## 2020-05-02 LAB — CBC
HCT: 39 % (ref 36.0–46.0)
Hemoglobin: 12.9 g/dL (ref 12.0–15.0)
MCH: 31.3 pg (ref 26.0–34.0)
MCHC: 33.1 g/dL (ref 30.0–36.0)
MCV: 94.7 fL (ref 80.0–100.0)
Platelets: 173 10*3/uL (ref 150–400)
RBC: 4.12 MIL/uL (ref 3.87–5.11)
RDW: 12.1 % (ref 11.5–15.5)
WBC: 6.2 10*3/uL (ref 4.0–10.5)
nRBC: 0 % (ref 0.0–0.2)

## 2020-05-02 LAB — HOMOCYSTEINE: Homocysteine: 5.8 umol/L (ref 0.0–19.2)

## 2020-05-02 LAB — LUPUS ANTICOAGULANT PANEL
DRVVT: 35.1 s (ref 0.0–47.0)
PTT Lupus Anticoagulant: 35.9 s (ref 0.0–51.9)

## 2020-05-02 LAB — PROTIME-INR
INR: 1 (ref 0.8–1.2)
Prothrombin Time: 13.3 seconds (ref 11.4–15.2)

## 2020-05-02 LAB — SURGICAL PCR SCREEN
MRSA, PCR: NEGATIVE
Staphylococcus aureus: NEGATIVE

## 2020-05-02 LAB — URINE CULTURE: Culture: 50000 — AB

## 2020-05-02 SURGERY — ENDARTERECTOMY, CAROTID
Anesthesia: General | Site: Neck | Laterality: Right

## 2020-05-02 MED ORDER — FENTANYL CITRATE (PF) 100 MCG/2ML IJ SOLN
INTRAMUSCULAR | Status: AC
  Administered 2020-05-02: 25 ug via INTRAVENOUS
  Filled 2020-05-02: qty 2

## 2020-05-02 MED ORDER — SODIUM CHLORIDE 0.9 % IV SOLN: INTRAVENOUS | Status: AC

## 2020-05-02 MED ORDER — ROCURONIUM BROMIDE 10 MG/ML (PF) SYRINGE
PREFILLED_SYRINGE | INTRAVENOUS | Status: DC | PRN
  Administered 2020-05-02: 50 mg via INTRAVENOUS

## 2020-05-02 MED ORDER — SODIUM CHLORIDE 0.9 % IV SOLN: INTRAVENOUS | Status: DC

## 2020-05-02 MED ORDER — SODIUM CHLORIDE 0.9 % IV SOLN: INTRAVENOUS | Status: DC | PRN

## 2020-05-02 MED ORDER — ESMOLOL HCL 100 MG/10ML IV SOLN
INTRAVENOUS | Status: DC | PRN
  Administered 2020-05-02: 20 mg via INTRAVENOUS

## 2020-05-02 MED ORDER — CLOPIDOGREL BISULFATE 75 MG PO TABS
75.0000 mg | ORAL_TABLET | Freq: Every day | ORAL | Status: DC
  Administered 2020-05-03 – 2020-05-04 (×2): 75 mg via ORAL
  Filled 2020-05-02 (×2): qty 1

## 2020-05-02 MED ORDER — ORAL CARE MOUTH RINSE: 15.0000 mL | Freq: Once | OROMUCOSAL | Status: AC

## 2020-05-02 MED ORDER — 0.9 % SODIUM CHLORIDE (POUR BTL) OPTIME
TOPICAL | Status: DC | PRN
  Administered 2020-05-02: 1000 mL

## 2020-05-02 MED ORDER — PROPOFOL 10 MG/ML IV BOLUS
INTRAVENOUS | Status: AC
  Filled 2020-05-02: qty 20

## 2020-05-02 MED ORDER — DEXAMETHASONE SODIUM PHOSPHATE 10 MG/ML IJ SOLN
INTRAMUSCULAR | Status: DC | PRN
  Administered 2020-05-02: 5 mg via INTRAVENOUS

## 2020-05-02 MED ORDER — HEPARIN SODIUM (PORCINE) 1000 UNIT/ML IJ SOLN
INTRAMUSCULAR | Status: AC
  Filled 2020-05-02: qty 1

## 2020-05-02 MED ORDER — SODIUM CHLORIDE 0.9 % IV SOLN: 500.0000 mL | Freq: Once | INTRAVENOUS | Status: DC | PRN

## 2020-05-02 MED ORDER — HYDRALAZINE HCL 20 MG/ML IJ SOLN
5.0000 mg | INTRAMUSCULAR | Status: DC | PRN
  Administered 2020-05-02: 5 mg via INTRAVENOUS
  Filled 2020-05-02: qty 1

## 2020-05-02 MED ORDER — OXYCODONE-ACETAMINOPHEN 5-325 MG PO TABS
1.0000 | ORAL_TABLET | ORAL | Status: DC | PRN
  Administered 2020-05-02 – 2020-05-03 (×4): 2 via ORAL
  Filled 2020-05-02 (×4): qty 2

## 2020-05-02 MED ORDER — ONDANSETRON HCL 4 MG/2ML IJ SOLN
INTRAMUSCULAR | Status: DC | PRN
  Administered 2020-05-02: 4 mg via INTRAVENOUS

## 2020-05-02 MED ORDER — PROPOFOL 10 MG/ML IV BOLUS
INTRAVENOUS | Status: DC | PRN
  Administered 2020-05-02: 150 mg via INTRAVENOUS

## 2020-05-02 MED ORDER — ONDANSETRON HCL 4 MG/2ML IJ SOLN
4.0000 mg | Freq: Four times a day (QID) | INTRAMUSCULAR | Status: DC | PRN
  Administered 2020-05-03: 4 mg via INTRAVENOUS
  Filled 2020-05-02 (×2): qty 2

## 2020-05-02 MED ORDER — PROTAMINE SULFATE 10 MG/ML IV SOLN
INTRAVENOUS | Status: DC | PRN
  Administered 2020-05-02: 50 mg via INTRAVENOUS

## 2020-05-02 MED ORDER — METOPROLOL TARTRATE 5 MG/5ML IV SOLN: 2.0000 mg | INTRAVENOUS | Status: DC | PRN

## 2020-05-02 MED ORDER — DEXAMETHASONE SODIUM PHOSPHATE 10 MG/ML IJ SOLN
INTRAMUSCULAR | Status: AC
  Filled 2020-05-02: qty 1

## 2020-05-02 MED ORDER — MORPHINE SULFATE (PF) 2 MG/ML IV SOLN
2.0000 mg | INTRAVENOUS | Status: DC | PRN
  Administered 2020-05-02 (×2): 2 mg via INTRAVENOUS
  Filled 2020-05-02 (×2): qty 1

## 2020-05-02 MED ORDER — PHENYLEPHRINE HCL-NACL 10-0.9 MG/250ML-% IV SOLN
INTRAVENOUS | Status: DC | PRN
  Administered 2020-05-02: 30 ug/min via INTRAVENOUS
  Administered 2020-05-02: 25 ug/min via INTRAVENOUS

## 2020-05-02 MED ORDER — FENTANYL CITRATE (PF) 250 MCG/5ML IJ SOLN
INTRAMUSCULAR | Status: DC | PRN
  Administered 2020-05-02: 50 ug via INTRAVENOUS

## 2020-05-02 MED ORDER — ONDANSETRON HCL 4 MG/2ML IJ SOLN: 4.0000 mg | Freq: Once | INTRAMUSCULAR | Status: DC | PRN

## 2020-05-02 MED ORDER — ESMOLOL HCL 100 MG/10ML IV SOLN
INTRAVENOUS | Status: AC
  Filled 2020-05-02: qty 10

## 2020-05-02 MED ORDER — CHLORHEXIDINE GLUCONATE 0.12 % MT SOLN: 15.0000 mL | Freq: Once | OROMUCOSAL | Status: AC

## 2020-05-02 MED ORDER — SODIUM CHLORIDE 0.9 % IV SOLN
INTRAVENOUS | Status: DC | PRN
  Administered 2020-05-02: 500 mL

## 2020-05-02 MED ORDER — CHLORHEXIDINE GLUCONATE 0.12 % MT SOLN
OROMUCOSAL | Status: AC
  Administered 2020-05-02: 15 mL via OROMUCOSAL
  Filled 2020-05-02: qty 15

## 2020-05-02 MED ORDER — FENTANYL CITRATE (PF) 100 MCG/2ML IJ SOLN
25.0000 ug | INTRAMUSCULAR | Status: DC | PRN
  Administered 2020-05-02: 25 ug via INTRAVENOUS
  Administered 2020-05-02: 50 ug via INTRAVENOUS

## 2020-05-02 MED ORDER — VANCOMYCIN HCL 1000 MG/200ML IV SOLN
1000.0000 mg | INTRAVENOUS | Status: AC
  Administered 2020-05-02: 1000 mg via INTRAVENOUS
  Filled 2020-05-02: qty 200

## 2020-05-02 MED ORDER — PANTOPRAZOLE SODIUM 40 MG PO TBEC
40.0000 mg | DELAYED_RELEASE_TABLET | Freq: Every day | ORAL | Status: DC
  Administered 2020-05-03 – 2020-05-04 (×2): 40 mg via ORAL
  Filled 2020-05-02 (×2): qty 1

## 2020-05-02 MED ORDER — PROTAMINE SULFATE 10 MG/ML IV SOLN
INTRAVENOUS | Status: AC
  Filled 2020-05-02: qty 5

## 2020-05-02 MED ORDER — LABETALOL HCL 5 MG/ML IV SOLN
10.0000 mg | INTRAVENOUS | Status: DC | PRN
  Administered 2020-05-03 – 2020-05-04 (×2): 10 mg via INTRAVENOUS
  Filled 2020-05-02 (×2): qty 4

## 2020-05-02 MED ORDER — LIDOCAINE 2% (20 MG/ML) 5 ML SYRINGE
INTRAMUSCULAR | Status: AC
  Filled 2020-05-02: qty 5

## 2020-05-02 MED ORDER — ROCURONIUM BROMIDE 10 MG/ML (PF) SYRINGE
PREFILLED_SYRINGE | INTRAVENOUS | Status: AC
  Filled 2020-05-02: qty 10

## 2020-05-02 MED ORDER — LIDOCAINE 2% (20 MG/ML) 5 ML SYRINGE
INTRAMUSCULAR | Status: DC | PRN
  Administered 2020-05-02: 40 mg via INTRAVENOUS

## 2020-05-02 MED ORDER — HEPARIN SODIUM (PORCINE) 1000 UNIT/ML IJ SOLN
INTRAMUSCULAR | Status: DC | PRN
  Administered 2020-05-02: 5000 [IU] via INTRAVENOUS

## 2020-05-02 MED ORDER — SUGAMMADEX SODIUM 200 MG/2ML IV SOLN
INTRAVENOUS | Status: DC | PRN
  Administered 2020-05-02: 100 mg via INTRAVENOUS

## 2020-05-02 MED ORDER — REMIFENTANIL HCL 1 MG IV SOLR
0.0125 ug/kg/min | INTRAVENOUS | Status: AC
  Administered 2020-05-02: .1 ug/kg/min via INTRAVENOUS
  Filled 2020-05-02 (×2): qty 2000

## 2020-05-02 MED ORDER — PHENYLEPHRINE 40 MCG/ML (10ML) SYRINGE FOR IV PUSH (FOR BLOOD PRESSURE SUPPORT)
PREFILLED_SYRINGE | INTRAVENOUS | Status: DC | PRN
  Administered 2020-05-02 (×4): 40 ug via INTRAVENOUS

## 2020-05-02 MED ORDER — FENTANYL CITRATE (PF) 250 MCG/5ML IJ SOLN
INTRAMUSCULAR | Status: AC
  Filled 2020-05-02: qty 5

## 2020-05-02 MED ORDER — POTASSIUM CHLORIDE CRYS ER 20 MEQ PO TBCR
40.0000 meq | EXTENDED_RELEASE_TABLET | Freq: Once | ORAL | Status: AC
  Administered 2020-05-03: 40 meq via ORAL
  Filled 2020-05-02 (×2): qty 2

## 2020-05-02 MED ORDER — ONDANSETRON HCL 4 MG/2ML IJ SOLN
INTRAMUSCULAR | Status: AC
  Filled 2020-05-02: qty 2

## 2020-05-02 SURGICAL SUPPLY — 39 items
CANISTER SUCT 3000ML PPV (MISCELLANEOUS) ×2 IMPLANT
CANNULA VESSEL 3MM 2 BLNT TIP (CANNULA) ×4 IMPLANT
CATH ROBINSON RED A/P 18FR (CATHETERS) ×2 IMPLANT
CLIP LIGATING EXTRA MED SLVR (CLIP) ×2 IMPLANT
CLIP LIGATING EXTRA SM BLUE (MISCELLANEOUS) ×2 IMPLANT
COVER WAND RF STERILE (DRAPES) ×2 IMPLANT
DECANTER SPIKE VIAL GLASS SM (MISCELLANEOUS) IMPLANT
DERMABOND ADVANCED (GAUZE/BANDAGES/DRESSINGS) ×1
DERMABOND ADVANCED .7 DNX12 (GAUZE/BANDAGES/DRESSINGS) ×1 IMPLANT
DRAIN CHANNEL 10F 3/8 F FF (DRAIN) IMPLANT
DRAIN HEMOVAC 1/8 X 5 (WOUND CARE) IMPLANT
ELECT REM PT RETURN 9FT ADLT (ELECTROSURGICAL) ×2
ELECTRODE REM PT RTRN 9FT ADLT (ELECTROSURGICAL) ×1 IMPLANT
EVACUATOR SILICONE 100CC (DRAIN) IMPLANT
GLOVE SS BIOGEL STRL SZ 7.5 (GLOVE) ×1 IMPLANT
GLOVE SUPERSENSE BIOGEL SZ 7.5 (GLOVE) ×1
GOWN STRL REUS W/ TWL LRG LVL3 (GOWN DISPOSABLE) ×3 IMPLANT
GOWN STRL REUS W/TWL LRG LVL3 (GOWN DISPOSABLE) ×3
KIT BASIN OR (CUSTOM PROCEDURE TRAY) ×2 IMPLANT
KIT SHUNT ARGYLE CAROTID ART 6 (VASCULAR PRODUCTS) ×1 IMPLANT
KIT TURNOVER KIT B (KITS) ×2 IMPLANT
NEEDLE 22X1 1/2 (OR ONLY) (NEEDLE) IMPLANT
NS IRRIG 1000ML POUR BTL (IV SOLUTION) ×4 IMPLANT
PACK CAROTID (CUSTOM PROCEDURE TRAY) ×2 IMPLANT
PAD ARMBOARD 7.5X6 YLW CONV (MISCELLANEOUS) ×4 IMPLANT
PATCH HEMASHIELD 8X75 (Vascular Products) ×1 IMPLANT
POSITIONER HEAD DONUT 9IN (MISCELLANEOUS) ×2 IMPLANT
SHUNT CAROTID BYPASS 10 (VASCULAR PRODUCTS) IMPLANT
SHUNT CAROTID BYPASS 12FRX15.5 (VASCULAR PRODUCTS) IMPLANT
SUT ETHILON 3 0 PS 1 (SUTURE) IMPLANT
SUT PROLENE 6 0 CC (SUTURE) ×4 IMPLANT
SUT SILK 3 0 (SUTURE)
SUT SILK 3-0 18XBRD TIE 12 (SUTURE) IMPLANT
SUT VIC AB 3-0 SH 27 (SUTURE) ×3
SUT VIC AB 3-0 SH 27X BRD (SUTURE) ×2 IMPLANT
SUT VICRYL 4-0 PS2 18IN ABS (SUTURE) ×2 IMPLANT
SYR CONTROL 10ML LL (SYRINGE) IMPLANT
TOWEL GREEN STERILE (TOWEL DISPOSABLE) ×2 IMPLANT
WATER STERILE IRR 1000ML POUR (IV SOLUTION) ×2 IMPLANT

## 2020-05-02 NOTE — Anesthesia Procedure Notes (Signed)
Procedure Name: Intubation Date/Time: 05/02/2020 11:31 AM Performed by: Gaylene Brooks, CRNA Pre-anesthesia Checklist: Patient identified, Emergency Drugs available, Suction available and Patient being monitored Patient Re-evaluated:Patient Re-evaluated prior to induction Oxygen Delivery Method: Circle System Utilized Preoxygenation: Pre-oxygenation with 100% oxygen Induction Type: IV induction Ventilation: Mask ventilation without difficulty Laryngoscope Size: Miller and 2 Grade View: Grade II Tube type: Oral Tube size: 7.0 mm Number of attempts: 1 Airway Equipment and Method: Stylet and Oral airway Placement Confirmation: ETT inserted through vocal cords under direct vision,  positive ETCO2 and breath sounds checked- equal and bilateral Secured at: 20 cm Tube secured with: Tape Dental Injury: Teeth and Oropharynx as per pre-operative assessment

## 2020-05-02 NOTE — Progress Notes (Addendum)
STROKE TEAM PROGRESS NOTE   INTERVAL HISTORY Planning for right CEA today.   Patient is drowsy postop and complaining of need to urinate. Otherwise denies recurrence of stroke symptoms or new concerns.  Vitals:   05/02/20 1430 05/02/20 1445 05/02/20 1455 05/02/20 1500  BP: (!) 145/61 (!) 151/65  (!) 157/63  Pulse: 70 74 76 77  Resp: 16 15 15 14   Temp:   98.1 F (36.7 C)   TempSrc:      SpO2: 100% 100% 100% 100%  Weight:      Height:       CBC:  Recent Labs  Lab 05/01/20 0440 05/02/20 0432  WBC 6.1 6.2  HGB 12.3 12.9  HCT 37.4 39.0  MCV 94.9 94.7  PLT 172 517   Basic Metabolic Panel:  Recent Labs  Lab 05/01/20 0440 05/02/20 0432  NA 138 135  K 3.7 3.4*  CL 105 104  CO2 26 23  GLUCOSE 91 78  BUN 13 15  CREATININE 0.44 0.67  CALCIUM 8.9 8.8*  MG 2.1  --    Lipid Panel:  Recent Labs  Lab 05/01/20 0440  CHOL 188  TRIG 36  HDL 88  CHOLHDL 2.1  VLDL 7  LDLCALC 93   HgbA1c:  Recent Labs  Lab 05/01/20 0440  HGBA1C 5.5   Urine Drug Screen: No results for input(s): LABOPIA, COCAINSCRNUR, LABBENZ, AMPHETMU, THCU, LABBARB in the last 168 hours.  Alcohol Level No results for input(s): ETH in the last 168 hours.  MRI Brain 1. 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 No hemorrhage or mass effect. 2. Findings of chronic small vessel ischemia.  CT head No acute intracranial abnormality.  CTA head and neck 1. No intracranial arterial occlusion or high-grade stenosis. 2. 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  2D Echo 1. Left ventricular ejection fraction, by estimation, is 60 to 65%. The left ventricle has normal function. The left ventricle has no regional wall motion abnormalities. Left ventricular diastolic parameters are consistent with Grade I diastolic  dysfunction (impaired relaxation). The average left ventricular global longitudinal  strain is -18.8 %. The global longitudinal strain is normal.  2. Right ventricular systolic function is normal. The right ventricular size is normal.  3. The mitral valve is normal in structure. No evidence of mitral valve regurgitation. No evidence of mitral stenosis. There is mild late systolicprolapse of the middle scallop of the posterior leaflet of the mitral valve.  4. The aortic valve is normal in structure. Aortic valve regurgitation is not visualized. No aortic stenosis is present.  5. The inferior vena cava is normal in size with greater than 50%  respiratory variability, suggesting right atrial pressure of 3 mmHg.   PHYSICAL EXAM Gen: 75 y.o. female lying in bed in NAD HEENT-  [place]/AT    Resp-No extra work of breathing Extremities- No edema Skin: warm and dry   Neurological Examination Mental Status: Mildly drowsy,  Speech fluent without evidence of aphasia.  Able to follow all commands without difficulty. No dysarthria.  Cranial Nerves: II: Temporal visual fields intact with no extinction to DSS. Pupils are equal.  III,IV, VI: EOMI. No ptosis. No nystagmus.  V,VII: Smile symmetric, facial temp sensation equal bilaterally VIII: hearing intact to voice IX,X: No hypophonia XI: Symmetric shoulder shrug XII: Midline tongue extension Motor: RUE and RLE 5/5 LUE 5/5  LLE 5/5 Sensory:  Light touch intact throughout, bilaterally, including the  hands. No extinction to DSS.   Plantars: Tonically upgoing bilaterally.  Cerebellar: No ataxia with FNF bilaterally. Right H-S normal. Left H-S with mild ataxia.  Gait: Deferred  ASSESSMENT/PLAN Wendy Jackson is an 75 y.o. female with PMH of lupus and thyroid disease presenting to the ED for evaluation of new onset left hand numbness and weakness with presyncopal sensation and unsteady gait. The patient first noticed the symptoms about 2-3 days ago, followed by resolution. The symptoms returned on Monday afternoon, so she went to the ED  for evaluation. 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 The patient states that she has had a diagnosis of lupus in the past due to a positive blood test and right upper extremity arthritis symptoms, but that this diagnosis was called into question after a subsequent blood test came back negative. 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 No hemorrhage or mass effect.  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 Long-term BP goal normotensive  Hyperlipidemia  Home meds:  None  LDL 93, goal < 70  Lipitor 40 mg daily added   Continue statin at discharge  Other Stroke Risk Factors  Advanced Age >/= 88   ?Lupus: on plaquenil - pt rheumatologist does not believe pt has lupus, there is plan to taper off plaquenil per pt  Hospital day # 0 This plan of care was directed by Dr. Erlinda Hong.  Hetty Blend  ATTENDING NOTE: I reviewed above note and agree with the assessment and plan. Pt was seen and examined.   Patient lying in bed, just finished right ICA today, she complained right neck sore.  She did have some bruises on the right neck  near incision site, but otherwise incision dry clear.  Patient neurologically intact, moving all extremities, no focal deficit.  No DVT, hypercoagulable work-up negative so far.  Continue treatment DAPT for 3 weeks and then aspirin alone.  Continue statin.  30-day cardiac event monitoring will be setup as outpatient to rule out A. fib.  We will follow-up with vascular surgery and neurology as outpatient.  For detailed assessment and plan, please refer to above as I have made changes wherever appropriate.   Neurology will sign off. Please call with questions. Pt will follow up with stroke clinic NP at [facility] in about 4 weeks. Thanks for the consult.   Rosalin Hawking, MD PhD Stroke Neurology 05/02/2020 5:20 PM     To contact Stroke Continuity provider, please refer to [email or web address] After hours, contact General Neurology

## 2020-05-02 NOTE — Transfer of Care (Signed)
Immediate Anesthesia Transfer of Care Note  Patient: Wendy Jackson  Procedure(s) Performed: RIGHT CAROTID ENDARTERECTOMY (Right Neck)  Patient Location: PACU  Anesthesia Type:General  Level of Consciousness: awake, alert  and oriented  Airway & Oxygen Therapy: Patient Spontanous Breathing and Patient connected to nasal cannula oxygen  Post-op Assessment: Report given to RN, Post -op Vital signs reviewed and stable and Patient moving all extremities X 4  Post vital signs: Reviewed and stable  Last Vitals:  Vitals Value Taken Time  BP 167/79 05/02/20 1359  Temp    Pulse 78 05/02/20 1402  Resp 14 05/02/20 1402  SpO2 100 % 05/02/20 1402  Vitals shown include unvalidated device data.  Last Pain:  Vitals:   05/02/20 0834  TempSrc: Oral  PainSc:          Complications: No complications documented.

## 2020-05-02 NOTE — Anesthesia Postprocedure Evaluation (Signed)
Anesthesia Post Note  Patient: Wendy Jackson  Procedure(s) Performed: RIGHT CAROTID ENDARTERECTOMY (Right Neck)     Patient location during evaluation: PACU Anesthesia Type: General Level of consciousness: awake and alert Pain management: pain level controlled Vital Signs Assessment: post-procedure vital signs reviewed and stable Respiratory status: spontaneous breathing, nonlabored ventilation, respiratory function stable and patient connected to nasal cannula oxygen Cardiovascular status: blood pressure returned to baseline and stable Postop Assessment: no apparent nausea or vomiting Anesthetic complications: no   No complications documented.  Last Vitals:  Vitals:   05/02/20 1500 05/02/20 1525  BP: (!) 157/63 (!) 155/72  Pulse: 77 86  Resp: 14 16  Temp:  36.5 C  SpO2: 100% 100%    Last Pain:  Vitals:   05/02/20 1525  TempSrc: Oral  PainSc:                  [name]

## 2020-05-02 NOTE — Evaluation (Signed)
Physical Therapy Evaluation & Discharge Patient Details Name: Wendy Jackson MRN: [id number] DOB: 1945/02/15 Today's Date: 05/02/2020   History of Present Illness  Pt is a 75 y/o female presenting to ED for evaluation of new onset L hand numbness and weaknesss, presyncopal sensation and unsteady gait.  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 Found with stenosis in R ICA, plan for carotid endarterectomy 4/20. PMH of lupus and thyroid disease  Clinical Impression  PTA, patient lives with husband and reports independence with mobility and ADLs. Patient overall independent with mobility. Patient scored 24/24 on DGI. Educated patient on benefits of exercise and performing 60 minutes/day of aerobic activity to improve strength and endurance. No further skilled PT services during acute stay. No PT follow up recommended at this time.      Follow Up Recommendations No PT follow up    Equipment Recommendations  None recommended by PT    Recommendations for Other Services       Precautions / Restrictions Precautions Precautions: None      Mobility  Bed Mobility Overal bed mobility: Independent                  Transfers Overall transfer level: Independent                  Ambulation/Gait Ambulation/Gait assistance: Modified independent (Device/Increase time) Gait Distance (Feet): 300 Feet Assistive device: None Gait Pattern/deviations: WFL(Within Functional Limits) Gait velocity: decreased      Stairs Stairs: Yes Stairs assistance: Modified independent (Device/Increase time) Stair Management: No rails;Alternating pattern;Forwards Number of Stairs: 2 General stair comments: Perforemd for DGI and home simulation  Wheelchair Mobility    Modified Rankin (Stroke Patients Only) Modified Rankin (Stroke Patients Only) Pre-Morbid Rankin Score: No symptoms Modified Rankin: No symptoms     Balance Overall balance  assessment: No apparent balance deficits (not formally assessed)                               Standardized Balance Assessment Standardized Balance Assessment : Dynamic Gait Index   Dynamic Gait Index Level Surface: Normal Change in Gait Speed: Normal Gait with Horizontal Head Turns: Normal Gait with Vertical Head Turns: Normal Gait and Pivot Turn: Normal Step Over Obstacle: Normal Step Around Obstacles: Normal Steps: Normal Total Score: 24       Pertinent Vitals/Pain Pain Assessment: No/denies pain    Home Living Family/patient expects to be discharged to:: Private residence Living Arrangements: Spouse/significant other Available Help at Discharge: Family;Available 24 hours/day Type of Home: House Home Access: Stairs to enter Entrance Stairs-Rails: None Entrance Stairs-Number of Steps: 2 Home Layout: One level Home Equipment: None      Prior Function Level of Independence: Independent         Comments: drives     Hand Dominance        Extremity/Trunk Assessment   Upper Extremity Assessment Upper Extremity Assessment: Overall WFL for tasks assessed    Lower Extremity Assessment Lower Extremity Assessment: Overall WFL for tasks assessed    Cervical / Trunk Assessment Cervical / Trunk Assessment: Normal  Communication   Communication: No difficulties  Cognition Arousal/Alertness: Awake/alert Behavior During Therapy: WFL for tasks assessed/performed Overall Cognitive Status: Within Functional Limits for tasks assessed  General Comments      Exercises     Assessment/Plan    PT Assessment Patent does not need any further PT services  PT Problem List         PT Treatment Interventions      PT Goals (Current goals can be found in the Care Plan section)  Acute Rehab PT Goals Patient Stated Goal: to go home PT Goal Formulation: All assessment and education complete, DC  therapy    Frequency     Barriers to discharge        Co-evaluation               AM-PAC PT "6 Clicks" Mobility  Outcome Measure Help needed turning from your back to your side while in a flat bed without using bedrails?: None Help needed moving from lying on your back to sitting on the side of a flat bed without using bedrails?: None Help needed moving to and from a bed to a chair (including a wheelchair)?: None Help needed standing up from a chair using your arms (e.g., wheelchair or bedside chair)?: None Help needed to walk in hospital room?: None Help needed climbing 3-5 steps with a railing? : None 6 Click Score: 24    End of Session   Activity Tolerance: Patient tolerated treatment well Patient left: in bed;with call bell/phone within reach;with family/visitor present Nurse Communication: Mobility status PT Visit Diagnosis: Muscle weakness (generalized) (M62.81)    Time: 3329-5188 PT Time Calculation (min) (ACUTE ONLY): 13 min   Charges:   PT Evaluation $PT Eval Low Complexity: 1 Low          Wendy Jackson PT, DPT Acute Rehabilitation Services Pager [phone] Office [phone]   Wendy Jackson 05/02/2020, 9:06 AM

## 2020-05-02 NOTE — Anesthesia Preprocedure Evaluation (Addendum)
Anesthesia Evaluation  Patient identified by MRN, date of birth, ID band Patient awake    Reviewed: Allergy & Precautions, NPO status , Patient's Chart, lab work & pertinent test results  Airway Mallampati: II  TM Distance: >3 FB Neck ROM: Full    Dental  (+) Teeth Intact, Dental Advisory Given   Pulmonary    breath sounds clear to auscultation       Cardiovascular  Rhythm:Regular Rate:Normal     Neuro/Psych    GI/Hepatic   Endo/Other    Renal/GU      Musculoskeletal   Abdominal   Peds  Hematology   Anesthesia Other Findings Awake and alert denies weakness  Reproductive/Obstetrics                             Anesthesia Physical Anesthesia Plan  ASA: III  Anesthesia Plan: General   Post-op Pain Management:    Induction: Intravenous  PONV Risk Score and Plan: Ondansetron and Dexamethasone  Airway Management Planned: Oral ETT  Additional Equipment: Arterial line  Intra-op Plan:   Post-operative Plan: Extubation in OR  Informed Consent: I have reviewed the patients History and Physical, chart, labs and discussed the procedure including the risks, benefits and alternatives for the proposed anesthesia with the patient or authorized representative who has indicated his/her understanding and acceptance.     Dental advisory given  Plan Discussed with: CRNA and Anesthesiologist  Anesthesia Plan Comments:         Anesthesia Quick Evaluation

## 2020-05-02 NOTE — Anesthesia Procedure Notes (Signed)
Arterial Line Insertion Start/End4/20/2022 10:15 AM, 05/02/2020 10:30 AM Performed by: Gaylene Brooks, CRNA, CRNA  Preanesthetic checklist: patient identified, IV checked, site marked, risks and benefits discussed, surgical consent, monitors and equipment checked, pre-op evaluation, timeout performed and anesthesia consent Lidocaine 1% used for infiltration Left, radial was placed Catheter size: 20 G Hand hygiene performed  and maximum sterile barriers used   Attempts: 1 Procedure performed without using ultrasound guided technique. Following insertion, dressing applied and Biopatch. Post procedure assessment: normal  Patient tolerated the procedure well with no immediate complications.

## 2020-05-02 NOTE — Interval H&P Note (Signed)
History and Physical Interval Note:  05/02/2020 10:39 AM  Wendy Jackson  has presented today for surgery, with the diagnosis of CVA.  The various methods of treatment have been discussed with the patient and family. After consideration of risks, benefits and other options for treatment, the patient has consented to  Procedure(s): RIGHT CAROTID ENDARTERECTOMY (Right) as a surgical intervention.  The patient's history has been reviewed, patient examined, no change in status, stable for surgery.  I have reviewed the patient's chart and labs.  Questions were answered to the patient's satisfaction.     [name]

## 2020-05-02 NOTE — Op Note (Signed)
   OPERATIVE REPORT  DATE OF SURGERY: 05/02/2020  PATIENT: Wendy Jackson, 75 y.o. female MRN: [id number]  DOB: 01-21-1945  PRE-OPERATIVE DIAGNOSIS: Right Carotid Stenosis, Symptomatic  POST-OPERATIVE DIAGNOSIS:  Same  PROCEDURE:  Right Carotid Endarterectomy with Dacron Patch Angioplasty  SURGEON:  [name], M.D.  PHYSICIAN ASSISTANT: Eveland PAC  The assistant was needed for exposure and to expedite the case  ANESTHESIA:   general  EBL: Less than 200 ml  Total I/O In: 1100 [I.V.:1100] Out: 150 [Blood:150]  BLOOD ADMINISTERED: none  DRAINS: none   SPECIMEN: none  COUNTS CORRECT:  YES  PLAN OF CARE: Admit to inpatient   PATIENT DISPOSITION:  PACU - hemodynamically stable and neurologically intact.  PROCEDURE DETAILS: The patient was taken to the operating room placed in supine position.  General anesthesia was administered.  The neck was prepped and draped in the usual sterile fashion.  An incision was made anterior to the sternocleidomastoid and carried down through the platysma with electrocautery.  The sternocleidomastoid was reflected posteriorly and the carotid sheath was opened.  The facial vein was ligated with 2-0 silk ties and divided.  The common carotid artery was encircled with an umbilical tape and Rummel tourniquet.  The vagus nerve was identified and preserved.  Dissection was continued onto the carotid bifurcation.  The superior thyroid artery was encircled with a 2-0 silk Potts tie.  The external carotid was encircled with a blue vessel loop and the internal carotid was encircled with an umbilical tape and Rummel tourniquet.  The hypoglossal nerve was identified and preserved.  The patient was given systemic heparin and after adequate circulation time, the internal, external and common carotid arteries were occluded with vascular clamps.  The common carotid artery was opened with an 11 blade and extended  longitudinally with Potts scissors.  A 10 shunt was  passed up the internal carotid and allowed to backbleed.  It was then passed down the common carotid where it was secured with Rummel tourniquet.  The endarterectomy was begun on the common carotid artery and the plaque was divided proximally with Potts scissors.  The endarterectomy was continued onto the bifurcation.  The external carotid was endarterectomized with an eversion technique and the internal carotid was endarterectomized in an open fashion.  Remaining atheromatous debris was removed from the endarterectomy plane.  A Finesse Hemashield Dacron patch was brought onto the field and was sewn as a patch angioplasty with a running 6-0 Prolene suture.  Prior to completion of the closure the shunt was removed and the usual flushing maneuvers were undertaken.  The anastomosis was completed and flow was restored first to the external and then the internal carotid artery.  Excellent flow characteristics were noted with hand-held Doppler in the internal and external carotid arteries.  The patient was given 50 mg of protamine to reverse the heparin.  The wounds were irrigated with saline.  Hemostasis was obtained with electrocautery.  The wounds were closed with 3-0 Vicryl to reapproximate the sternocleidomastoid over the carotid sheath.  The platysma was lysed with a running 3-0 Vicryl suture.  The skin was closed with a 4-0 subcuticular Vicryl stitch.  Dermabond was applied.  The patient was awakened neurologically intact in the operating room and transferred to the recovery room in stable condition   [name], M.D. 05/02/2020 2:03 PM

## 2020-05-02 NOTE — Progress Notes (Signed)
OT Cancellation Note  Patient Details Name: Wendy Jackson MRN: [id number] DOB: [birth year]   Cancelled Treatment:    Reason Eval/Treat Not Completed: Patient at procedure or test/ unavailable- RN reports pt leaving for surgery.  Will follow up and see as able post procedure.   Jolaine Artist, OT Acute Rehabilitation Services Pager [phone] Office [phone]   [name] 05/02/2020, 9:37 AM

## 2020-05-02 NOTE — Progress Notes (Signed)
Pt transferred to 4 east, CHG completed, VSS, tele initiated. NIH 0.  Complains of an 8/10 pain will medicate per MAR.

## 2020-05-02 NOTE — Progress Notes (Signed)
Lower extremity venous has been completed.   Preliminary results in CV Proc.   Abram Sander 05/02/2020 10:32 AM

## 2020-05-02 NOTE — Progress Notes (Signed)
PROGRESS NOTE    Wendy Jackson  PIR:[id number] DOB: 26-Jan-1945 DOA: 04/30/2020 PCP: Ronnald Nian, DO    Chief Complaint  Patient presents with  . Dizziness  . Weakness    Brief Narrative:  75 year old history hypothyroidism, lupus presented to the ED for left-sided numbness and weakness, gait abnormality with she was noted to have a small acute infarct involving the right precentral postcentral secondary to right ICA stenosis with high risk plaque. Neurology consulted and recommended 30 day event monitorning to rule out atrial fib.  Currently on DAPT with aspirin and palvix for 3 weeks followed by aspirin alone.   Assessment & Plan:   Principal Problem:   Acute ischemic stroke [facility]) Active Problems:   Acquired hypothyroidism   Lupus (HCC)   Numbness of left hand   Allergic rhinitis   Acute infarct involving the right precentral and post central gyri,  probably embolic,  MRI brain showing 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.  CT angio of the head and neck showed No intracranial arterial occlusion or high-grade stenosis. 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 Currently on aspirin and plavix for 3 weeks followed by Asprin alone. Continue with lipitor 40 mg daily.  Lower extremity duplex is negative for acute DVT.  Echo showed LVEF of 60 to 65% no regional wall abnormalities. Grade 1 diastolic dysfunction. No atrial level shunt.  Vascular surgery consulted and she is scheduled to undergo right carotid endarterectomy.  Therapy evaluations are pending.    Lupus:  Resume home meds.   Hypothydoidism: Resume home meds.     DVT prophylaxis: SCDs Code Status: Full code Family Communication: None Disposition:   Status is: Observation  The patient will require care spanning > 2 midnights and should be moved to inpatient because: Ongoing diagnostic testing  needed not appropriate for outpatient work up  Dispo: The patient is from: Home              Anticipated d/c is to: Pending              Patient currently is not medically stable to d/c.   Difficult to place patient No       Consultants:   Neurology  Vascular surgery.    Procedures: scheduled for right carotid endarterectomy    Antimicrobials: none.    Subjective: No new complaints.  Objective: Vitals:   05/01/20 2312 05/02/20 0413 05/02/20 0500 05/02/20 0834  BP: (!) 143/84 (!) 151/68  (!) 176/75  Pulse: 68 68  76  Resp:  18  16  Temp: (!) 97.3 F (36.3 C) 97.7 F (36.5 C)  (!) 97.5 F (36.4 C)  TempSrc: Oral Oral  Oral  SpO2: 98% 100%  100%  Weight:   40.3 kg   Height:       No intake or output data in the 24 hours ending 05/02/20 1151 Filed Weights   04/30/20 1817 05/02/20 0500  Weight: 45.4 kg 40.3 kg    Examination:  General exam: Appears calm and comfortable  Respiratory system: Clear to auscultation. Respiratory effort normal. Cardiovascular system: S1 & S2 heard, RRR. No JVD, No pedal edema. Gastrointestinal system: Abdomen is nondistended, soft and nontender. Normal bowel sounds heard. Central nervous system: Alert and oriented. No focal neurological deficits. Extremities: Symmetric 5 x 5 power. Skin: No rashes, lesions or ulcers Psychiatry: . Mood & affect appropriate.     Data Reviewed: I have personally  reviewed following labs and imaging studies  CBC: Recent Labs  Lab 04/30/20 1844 05/01/20 0440 05/02/20 0432  WBC 6.2 6.1 6.2  HGB 13.3 12.3 12.9  HCT 40.8 37.4 39.0  MCV 96.7 94.9 94.7  PLT 199 172 675    Basic Metabolic Panel: Recent Labs  Lab 04/30/20 1844 05/01/20 0440 05/02/20 0432  NA 139 138 135  K 3.9 3.7 3.4*  CL 103 105 104  CO2 26 26 23   GLUCOSE 111* 91 78  BUN 18 13 15   CREATININE 0.65 0.44 0.67  CALCIUM 8.9 8.9 8.8*  MG  --  2.1  --     GFR: Estimated Creatinine Clearance: 39.8 mL/min (by C-G formula  based on SCr of 0.67 mg/dL).  Liver Function Tests: Recent Labs  Lab 04/30/20 1845  AST 36  ALT 30  ALKPHOS 68  BILITOT 0.8  PROT 6.6  ALBUMIN 4.0    CBG: Recent Labs  Lab 04/30/20 2027  GLUCAP 91     Recent Results (from the past 240 hour(s))  Urine Culture     Status: Abnormal   Collection Time: 04/30/20 11:23 PM   Specimen: Urine, Random  Result Value Ref Range Status   Specimen Description   Final    URINE, RANDOM Performed at [facility] [address] [place], [place] [address]    Special Requests   Final    NONE Performed at [facility], [place] [address] [place], [place] [address]    Culture (A)  Final    50,000 COLONIES/mL GROUP B STREP(S.AGALACTIAE)ISOLATED TESTING AGAINST S. AGALACTIAE NOT ROUTINELY PERFORMED DUE TO PREDICTABILITY OF AMP/PEN/VAN SUSCEPTIBILITY. Performed at [facility], [place] [address] [place], [place] [address]    Report Status 05/02/2020 FINAL  Final  Resp Panel by RT-PCR (Flu A&B, Covid) Nasopharyngeal Swab     Status: None   Collection Time: 04/30/20 11:29 PM   Specimen: Nasopharyngeal Swab; Nasopharyngeal(NP) swabs in vial transport medium  Result Value Ref Range Status   SARS Coronavirus 2 by RT PCR NEGATIVE NEGATIVE Final    Comment: (NOTE) SARS-CoV-2 target nucleic acids are NOT DETECTED.  The SARS-CoV-2 RNA is generally detectable in upper respiratory specimens during the acute phase of infection. The lowest concentration of SARS-CoV-2 viral copies this assay can detect is 138 copies/mL. A negative result does not preclude SARS-Cov-2 infection and should not be used as the sole basis for treatment or other patient management decisions. A negative result may occur with  improper specimen collection/handling, submission of specimen other than nasopharyngeal swab, presence of viral mutation(s) within the areas targeted by this assay, and inadequate number of viral copies(<138  copies/mL). A negative result must be combined with clinical observations, patient history, and epidemiological information. The expected result is Negative.  Fact Sheet for Patients:  EntrepreneurPulse.com.au  Fact Sheet for Healthcare Providers:  IncredibleEmployment.be  This test is no t yet approved or cleared by the [place] FDA and  has been authorized for detection and/or diagnosis of SARS-CoV-2 by FDA under an Emergency Use Authorization (EUA). This EUA will remain  in effect (meaning this test can be used) for the duration of the COVID-19 declaration under Section 564(b)(1) of the Act, 21 U.S.C.section 360bbb-3(b)(1), unless the authorization is terminated  or revoked sooner.       Influenza A by PCR NEGATIVE NEGATIVE Final   Influenza B by PCR NEGATIVE NEGATIVE Final    Comment: (NOTE) The Xpert Xpress SARS-CoV-2/FLU/RSV plus assay is  intended as an aid in the diagnosis of influenza from Nasopharyngeal swab specimens and should not be used as a sole basis for treatment. Nasal washings and aspirates are unacceptable for Xpert Xpress SARS-CoV-2/FLU/RSV testing.  Fact Sheet for Patients: EntrepreneurPulse.com.au  Fact Sheet for Healthcare Providers: IncredibleEmployment.be  This test is not yet approved or cleared by the [place] FDA and has been authorized for detection and/or diagnosis of SARS-CoV-2 by FDA under an Emergency Use Authorization (EUA). This EUA will remain in effect (meaning this test can be used) for the duration of the COVID-19 declaration under Section 564(b)(1) of the Act, 21 U.S.C. section 360bbb-3(b)(1), unless the authorization is terminated or revoked.  Performed at [facility], [place] [address], [place], [place] [address]   Surgical pcr screen     Status: None   Collection Time: 05/02/20  4:27 AM   Specimen: Nasal Mucosa; Nasal Swab  Result  Value Ref Range Status   MRSA, PCR NEGATIVE NEGATIVE Final   Staphylococcus aureus NEGATIVE NEGATIVE Final    Comment: (NOTE) The Xpert SA Assay (FDA approved for NASAL specimens in patients 54 years of age and older), is one component of a comprehensive surveillance program. It is not intended to diagnose infection nor to guide or monitor treatment. Performed at [facility], [place] [address], [place], [place] [address]          Radiology Studies: CT Angio Head W or Wo Contrast  Result Date: 05/01/2020 CLINICAL DATA:  Weakness, dizziness and left hand numbness EXAM: CT ANGIOGRAPHY HEAD AND NECK TECHNIQUE: Multidetector CT imaging of the head and neck was performed using the standard protocol during bolus administration of intravenous contrast. Multiplanar CT image reconstructions and MIPs were obtained to evaluate the vascular anatomy. Carotid stenosis measurements (when applicable) are obtained utilizing NASCET criteria, using the distal internal carotid diameter as the denominator. CONTRAST:  140mL OMNIPAQUE IOHEXOL 350 MG/ML SOLN COMPARISON:  None. FINDINGS: CT HEAD FINDINGS Brain: There is no mass, hemorrhage or extra-axial collection. The size and configuration of the ventricles and extra-axial CSF spaces are normal. There is no acute or chronic infarction. The brain parenchyma is normal. Skull: The visualized skull base, calvarium and extracranial soft tissues are normal. Sinuses/Orbits: No fluid levels or advanced mucosal thickening of the visualized paranasal sinuses. No mastoid or middle ear effusion. The orbits are normal. CTA NECK FINDINGS SKELETON: There is no bony spinal canal stenosis. No lytic or blastic lesion. OTHER NECK: Normal pharynx, larynx and major salivary glands. No cervical lymphadenopathy. Unremarkable thyroid gland. UPPER CHEST: No pneumothorax or pleural effusion. No nodules or masses. AORTIC ARCH: There is calcific atherosclerosis of the aortic arch. There is no  aneurysm, dissection or hemodynamically significant stenosis of the visualized portion of the aorta. Conventional 3 vessel aortic branching pattern. The visualized proximal subclavian arteries are widely patent. RIGHT CAROTID SYSTEM: No dissection, occlusion or aneurysm. There is mixed density atherosclerosis extending into the proximal ICA, resulting in 50% stenosis. LEFT CAROTID SYSTEM: No dissection, occlusion or aneurysm. Mild atherosclerotic calcification at the carotid bifurcation without hemodynamically significant stenosis. VERTEBRAL ARTERIES: Left dominant configuration. Both origins are clearly patent. There is no dissection, occlusion or flow-limiting stenosis to the skull base (V1-V3 segments). CTA HEAD FINDINGS POSTERIOR CIRCULATION: --Vertebral arteries: Normal V4 segments. --Inferior cerebellar arteries: Normal. --Basilar artery: Normal. --Superior cerebellar arteries: Normal. --Posterior cerebral arteries (PCA): Normal. ANTERIOR CIRCULATION: --Intracranial internal carotid arteries: Normal. --Anterior cerebral arteries (ACA): Normal. Both A1 segments are present. Patent anterior  communicating artery (a-comm). --Middle cerebral arteries (MCA): Normal. VENOUS SINUSES: As permitted by contrast timing, patent. ANATOMIC VARIANTS: None Review of the MIP images confirms the above findings. IMPRESSION: 1. No intracranial arterial occlusion or high-grade stenosis. 2. 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 Aortic Atherosclerosis (ICD10-I70.0). Electronically Signed   By: Ulyses Jarred M.D.   On: 05/01/2020 01:10   DG Chest 2 View  Result Date: 04/30/2020 CLINICAL DATA:  Numbness, left hand weakness EXAM: CHEST - 2 VIEW COMPARISON:  None. FINDINGS: There is hyperinflation of the lungs compatible with COPD. Calcified granuloma in the left upper lobe. Biapical scarring. No acute confluent opacities. Heart is normal size. No acute  bony abnormality. IMPRESSION: COPD/chronic changes.  No active disease. Electronically Signed   By: Rolm Baptise M.D.   On: 04/30/2020 19:13   CT Head Wo Contrast  Result Date: 04/30/2020 CLINICAL DATA:  Dizziness, left hand numbness EXAM: CT HEAD WITHOUT CONTRAST TECHNIQUE: Contiguous axial images were obtained from the base of the skull through the vertex without intravenous contrast. COMPARISON:  None. FINDINGS: Brain: No acute intracranial abnormality. Specifically, no hemorrhage, hydrocephalus, mass lesion, acute infarction, or significant intracranial injury. Vascular: No hyperdense vessel or unexpected calcification. Skull: No acute calvarial abnormality. Sinuses/Orbits: Visualized paranasal sinuses and mastoids clear. Orbital soft tissues unremarkable. Other: None IMPRESSION: No acute intracranial abnormality. Electronically Signed   By: Rolm Baptise M.D.   On: 04/30/2020 19:30   CT Angio Neck W and/or Wo Contrast  Result Date: 05/01/2020 CLINICAL DATA:  Weakness, dizziness and left hand numbness EXAM: CT ANGIOGRAPHY HEAD AND NECK TECHNIQUE: Multidetector CT imaging of the head and neck was performed using the standard protocol during bolus administration of intravenous contrast. Multiplanar CT image reconstructions and MIPs were obtained to evaluate the vascular anatomy. Carotid stenosis measurements (when applicable) are obtained utilizing NASCET criteria, using the distal internal carotid diameter as the denominator. CONTRAST:  161mL OMNIPAQUE IOHEXOL 350 MG/ML SOLN COMPARISON:  None. FINDINGS: CT HEAD FINDINGS Brain: There is no mass, hemorrhage or extra-axial collection. The size and configuration of the ventricles and extra-axial CSF spaces are normal. There is no acute or chronic infarction. The brain parenchyma is normal. Skull: The visualized skull base, calvarium and extracranial soft tissues are normal. Sinuses/Orbits: No fluid levels or advanced mucosal thickening of the visualized  paranasal sinuses. No mastoid or middle ear effusion. The orbits are normal. CTA NECK FINDINGS SKELETON: There is no bony spinal canal stenosis. No lytic or blastic lesion. OTHER NECK: Normal pharynx, larynx and major salivary glands. No cervical lymphadenopathy. Unremarkable thyroid gland. UPPER CHEST: No pneumothorax or pleural effusion. No nodules or masses. AORTIC ARCH: There is calcific atherosclerosis of the aortic arch. There is no aneurysm, dissection or hemodynamically significant stenosis of the visualized portion of the aorta. Conventional 3 vessel aortic branching pattern. The visualized proximal subclavian arteries are widely patent. RIGHT CAROTID SYSTEM: No dissection, occlusion or aneurysm. There is mixed density atherosclerosis extending into the proximal ICA, resulting in 50% stenosis. LEFT CAROTID SYSTEM: No dissection, occlusion or aneurysm. Mild atherosclerotic calcification at the carotid bifurcation without hemodynamically significant stenosis. VERTEBRAL ARTERIES: Left dominant configuration. Both origins are clearly patent. There is no dissection, occlusion or flow-limiting stenosis to the skull base (V1-V3 segments). CTA HEAD FINDINGS POSTERIOR CIRCULATION: --Vertebral arteries: Normal V4 segments. --Inferior cerebellar arteries: Normal. --Basilar artery: Normal. --Superior cerebellar arteries: Normal. --Posterior cerebral arteries (PCA): Normal. ANTERIOR CIRCULATION: --Intracranial internal carotid arteries: Normal. --Anterior  cerebral arteries (ACA): Normal. Both A1 segments are present. Patent anterior communicating artery (a-comm). --Middle cerebral arteries (MCA): Normal. VENOUS SINUSES: As permitted by contrast timing, patent. ANATOMIC VARIANTS: None Review of the MIP images confirms the above findings. IMPRESSION: 1. No intracranial arterial occlusion or high-grade stenosis. 2. Approximately 50% stenosis of the proximal right internal carotid artery secondary to mixed density  atherosclerosis. Aortic Atherosclerosis (ICD10-I70.0). Electronically Signed   By: Ulyses Jarred M.D.   On: 05/01/2020 01:10   MR BRAIN WO CONTRAST  Result Date: 04/30/2020 CLINICAL DATA:  Dizziness with left hand numbness EXAM: MRI HEAD WITHOUT CONTRAST TECHNIQUE: Multiplanar, multiecho pulse sequences of the brain and surrounding structures were obtained without intravenous contrast. COMPARISON:  None. 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 No other abnormal diffusion restriction. No acute or chronic hemorrhage. There is multifocal hyperintense T2-weighted signal within the white matter. Parenchymal volume and CSF spaces are normal. The midline structures are normal. Vascular: Major flow voids are preserved. Skull and upper cervical spine: Normal calvarium and skull base. Visualized upper cervical spine and soft tissues are normal. Sinuses/Orbits:No paranasal sinus fluid levels or advanced mucosal thickening. No mastoid or middle ear effusion. Normal orbits. IMPRESSION: 1. 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 No hemorrhage or mass effect. 2. Findings of chronic small vessel ischemia. Electronically Signed   By: Ulyses Jarred M.D.   On: 04/30/2020 22:34   ECHOCARDIOGRAM COMPLETE BUBBLE STUDY  Result Date: 05/01/2020    ECHOCARDIOGRAM REPORT   Patient Name:   Wendy Jackson Date of Exam: 05/01/2020 Medical Rec #:  [id number]   Height:       64.0 in Accession #:    [id number]  Weight:       100.0 lb Date of Birth:  [birth year]  BSA:          1.457 m Patient Age:    52 years    BP:           158/84 mmHg Patient Gender: F           HR:           88 bpm. Exam Location:  Inpatient Procedure: 2D Echo, Cardiac Doppler, Color Doppler and Saline Contrast Bubble            Study Indications:    Stroke 434.91 / I63.9  History:        Patient has no prior history of Echocardiogram examinations.                 Thyroid disease. History of  lupus.  Sonographer:    Darlina Sicilian RDCS Referring Phys: [id number] [place]  1. Left ventricular ejection fraction, by estimation, is 60 to 65%. The left ventricle has normal function. The left ventricle has no regional wall motion abnormalities. Left ventricular diastolic parameters are consistent with Grade I diastolic dysfunction (impaired relaxation). The average left ventricular global longitudinal strain is -18.8 %. The global longitudinal strain is normal.  2. Right ventricular systolic function is normal. The right ventricular size is normal.  3. The mitral valve is normal in structure. No evidence of mitral valve regurgitation. No evidence of mitral stenosis. There is mild late systolic prolapse of the middle scallop of the posterior leaflet of the mitral valve.  4. The aortic valve is normal in structure. Aortic valve regurgitation is not visualized. No aortic stenosis is present.  5. The inferior vena cava is normal in  size with greater than 50% respiratory variability, suggesting right atrial pressure of 3 mmHg. FINDINGS  Left Ventricle: Left ventricular ejection fraction, by estimation, is 60 to 65%. The left ventricle has normal function. The left ventricle has no regional wall motion abnormalities. The average left ventricular global longitudinal strain is -18.8 %. The global longitudinal strain is normal. The left ventricular internal cavity size was normal in size. There is no left ventricular hypertrophy. Left ventricular diastolic parameters are consistent with Grade I diastolic dysfunction (impaired relaxation). Normal left ventricular filling pressure. Right Ventricle: The right ventricular size is normal. No increase in right ventricular wall thickness. Right ventricular systolic function is normal. Left Atrium: Left atrial size was normal in size. Right Atrium: Right atrial size was normal in size. Pericardium: There is no evidence of pericardial effusion. Mitral Valve:  The mitral valve is normal in structure. There is mild late systolic prolapse of the middle scallop of the posterior leaflet of the mitral valve. No evidence of mitral valve regurgitation. No evidence of mitral valve stenosis. Tricuspid Valve: The tricuspid valve is normal in structure. Tricuspid valve regurgitation is not demonstrated. No evidence of tricuspid stenosis. Aortic Valve: The aortic valve is normal in structure. Aortic valve regurgitation is not visualized. No aortic stenosis is present. Pulmonic Valve: The pulmonic valve was not well visualized. Pulmonic valve regurgitation is not visualized. No evidence of pulmonic stenosis. Aorta: The aortic root is normal in size and structure. Venous: The inferior vena cava is normal in size with greater than 50% respiratory variability, suggesting right atrial pressure of 3 mmHg. IAS/Shunts: No atrial level shunt detected by color flow Doppler. Agitated saline contrast was given intravenously to evaluate for intracardiac shunting.  LEFT VENTRICLE PLAX 2D LVIDd:         3.80 cm  Diastology LVIDs:         2.60 cm  LV e' medial:    6.53 cm/s LV PW:         0.70 cm  LV E/e' medial:  12.6 LV IVS:        0.80 cm  LV e' lateral:   8.02 cm/s LVOT diam:     1.70 cm  LV E/e' lateral: 10.2 LV SV:         37 LV SV Index:   25       2D Longitudinal Strain LVOT Area:     2.27 cm 2D Strain GLS Avg:     -18.8 %  RIGHT VENTRICLE RV S prime:     15.70 cm/s TAPSE (M-mode): 2.3 cm LEFT ATRIUM             Index       RIGHT ATRIUM          Index LA diam:        2.50 cm 1.72 cm/m  RA Area:     6.99 cm LA Vol (A2C):   27.8 ml 19.08 ml/m RA Volume:   10.50 ml 7.21 ml/m LA Vol (A4C):   14.0 ml 9.61 ml/m LA Biplane Vol: 20.6 ml 14.14 ml/m  AORTIC VALVE LVOT Vmax:   85.80 cm/s LVOT Vmean:  60.100 cm/s LVOT VTI:    0.163 m  AORTA Ao Root diam: 3.00 cm MITRAL VALVE MV Area (PHT): 3.60 cm     SHUNTS MV Decel Time: 211 msec     Systemic VTI:  0.16 m MV E velocity: 82.00 cm/s   Systemic  Diam: 1.70 cm MV A velocity: 102.00 cm/s  MV E/A ratio:  0.80 Mihai Croitoru MD Electronically signed by Sanda Klein MD Signature Date/Time: 05/01/2020/3:11:36 PM    Final    VAS [place] LOWER EXTREMITY VENOUS (DVT)  Result Date: 05/02/2020  Lower Venous DVT Study Indications: Stroke.  Comparison Study: no prior Performing Technologist: Abram Sander RVS  Examination Guidelines: A complete evaluation includes B-mode imaging, spectral Doppler, color Doppler, and power Doppler as needed of all accessible portions of each vessel. Bilateral testing is considered an integral part of a complete examination. Limited examinations for reoccurring indications may be performed as noted. The reflux portion of the exam is performed with the patient in reverse Trendelenburg.  +---------+---------------+---------+-----------+----------+-------------------+ RIGHT    CompressibilityPhasicitySpontaneityPropertiesThrombus Aging      +---------+---------------+---------+-----------+----------+-------------------+ CFV      Full           Yes      Yes                                      +---------+---------------+---------+-----------+----------+-------------------+ SFJ      Full                                                             +---------+---------------+---------+-----------+----------+-------------------+ FV Prox  Full                                                             +---------+---------------+---------+-----------+----------+-------------------+ FV Mid   Full                                                             +---------+---------------+---------+-----------+----------+-------------------+ FV DistalFull                                                             +---------+---------------+---------+-----------+----------+-------------------+ PFV      Full                                                              +---------+---------------+---------+-----------+----------+-------------------+ POP      Full           Yes      Yes                                      +---------+---------------+---------+-----------+----------+-------------------+ PTV      Full                                                             +---------+---------------+---------+-----------+----------+-------------------+  PERO                                                  Not well visualized +---------+---------------+---------+-----------+----------+-------------------+   +---------+---------------+---------+-----------+----------+-------------------+ LEFT     CompressibilityPhasicitySpontaneityPropertiesThrombus Aging      +---------+---------------+---------+-----------+----------+-------------------+ CFV      Full           Yes      Yes                                      +---------+---------------+---------+-----------+----------+-------------------+ SFJ      Full                                                             +---------+---------------+---------+-----------+----------+-------------------+ FV Prox  Full                                                             +---------+---------------+---------+-----------+----------+-------------------+ FV Mid   Full                                                             +---------+---------------+---------+-----------+----------+-------------------+ FV DistalFull                                                             +---------+---------------+---------+-----------+----------+-------------------+ PFV      Full                                                             +---------+---------------+---------+-----------+----------+-------------------+ POP      Full           Yes      Yes                                      +---------+---------------+---------+-----------+----------+-------------------+  PTV      Full                                                             +---------+---------------+---------+-----------+----------+-------------------+ PERO  Not well visualized +---------+---------------+---------+-----------+----------+-------------------+     Summary: BILATERAL: - No evidence of deep vein thrombosis seen in the lower extremities, bilaterally. - No evidence of superficial venous thrombosis in the lower extremities, bilaterally. -No evidence of popliteal cyst, bilaterally.   *See table(s) above for measurements and observations.    Preliminary         Scheduled Meds: . [MAR Hold]  stroke: mapping our early stages of recovery book   Does not apply Once  . [MAR Hold] aspirin  81 mg Oral Daily  . [MAR Hold] atorvastatin  40 mg Oral Daily  . [MAR Hold] clopidogrel  75 mg Oral Daily  . [MAR Hold] hydroxychloroquine  200 mg Oral Daily  . [MAR Hold] loratadine  10 mg Oral Daily  . potassium chloride  40 mEq Oral Once  . remifentanil (ULTIVA) 2 mg in 100 mL normal saline (20 mcg/mL) Optime  0.0125 mcg/kg/min Intravenous To OR  . [MAR Hold] thyroid  30 mg Oral Daily   Continuous Infusions: . sodium chloride 10 mL/hr at 05/02/20 1016  . [MAR Hold] vancomycin       LOS: 0 days        Hosie Poisson, MD [facility]   To contact the attending provider between 7A-7P or the covering provider during after hours 7P-7A, please log into the web site [email or web address] and access using universal [place] password for that web site. If you do not have the password, please call the hospital operator.  05/02/2020, 11:51 AM

## 2020-05-03 ENCOUNTER — Encounter (HOSPITAL_COMMUNITY): Payer: Self-pay | Admitting: Vascular Surgery

## 2020-05-03 ENCOUNTER — Other Ambulatory Visit: Payer: Self-pay | Admitting: Physician Assistant

## 2020-05-03 DIAGNOSIS — I63231 Cerebral infarction due to unspecified occlusion or stenosis of right carotid arteries: Secondary | ICD-10-CM

## 2020-05-03 LAB — CARDIOLIPIN ANTIBODIES, IGG, IGM, IGA
Anticardiolipin IgA: <9 APL U/mL (ref 0–11)
Anticardiolipin IgG: <9 GPL U/mL (ref 0–14)
Anticardiolipin IgM: <9 MPL U/mL (ref 0–12)

## 2020-05-03 LAB — BASIC METABOLIC PANEL
Anion gap: 9 (ref 5–15)
BUN: 12 mg/dL (ref 8–23)
CO2: 21 mmol/L — ABNORMAL LOW (ref 22–32)
Calcium: 8.4 mg/dL — ABNORMAL LOW (ref 8.9–10.3)
Chloride: 107 mmol/L (ref 98–111)
Creatinine, Ser: 0.58 mg/dL (ref 0.44–1.00)
GFR, Estimated: >60 mL/min (ref >60)
Glucose, Bld: 65 mg/dL — ABNORMAL LOW (ref 70–99)
Potassium: 3.6 mmol/L (ref 3.5–5.1)
Sodium: 137 mmol/L (ref 135–145)

## 2020-05-03 LAB — CBC
HCT: 36 % (ref 36.0–46.0)
Hemoglobin: 11.9 g/dL — ABNORMAL LOW (ref 12.0–15.0)
MCH: 31.6 pg (ref 26.0–34.0)
MCHC: 33.1 g/dL (ref 30.0–36.0)
MCV: 95.5 fL (ref 80.0–100.0)
Platelets: 180 10*3/uL (ref 150–400)
RBC: 3.77 MIL/uL — ABNORMAL LOW (ref 3.87–5.11)
RDW: 12.1 % (ref 11.5–15.5)
WBC: 9.6 10*3/uL (ref 4.0–10.5)
nRBC: 0 % (ref 0.0–0.2)

## 2020-05-03 MED ORDER — CLOPIDOGREL BISULFATE 75 MG PO TABS: 75.0000 mg | ORAL_TABLET | Freq: Every day | ORAL | 0 refills | Status: DC

## 2020-05-03 MED ORDER — ATORVASTATIN CALCIUM 10 MG PO TABS: 40.0000 mg | ORAL_TABLET | Freq: Every day | ORAL | 3 refills | Status: DC

## 2020-05-03 MED ORDER — ASPIRIN 81 MG PO CHEW: 81.0000 mg | CHEWABLE_TABLET | Freq: Every day | ORAL | 3 refills | Status: DC

## 2020-05-03 MED ORDER — ONDANSETRON HCL 4 MG/2ML IJ SOLN
4.0000 mg | Freq: Four times a day (QID) | INTRAMUSCULAR | Status: DC | PRN
  Administered 2020-05-03: 4 mg via INTRAVENOUS

## 2020-05-03 MED ORDER — SODIUM CHLORIDE 0.9 % IV SOLN
6.2500 mg | Freq: Four times a day (QID) | INTRAVENOUS | Status: DC | PRN
  Filled 2020-05-03: qty 0.25

## 2020-05-03 NOTE — Progress Notes (Signed)
Mobility Specialist: Progress Note   05/03/20 1240  Mobility  Activity Transferred to/from [facility]  Level of Assistance Standby assist, set-up cues, supervision of patient - no hands on  Assistive Device Front wheel walker  Distance Ambulated (ft) 2 ft  Mobility Response Tolerated fair  Mobility performed by Mobility specialist  $Mobility charge 1 Mobility   Pre-Mobility: 83 HR, 146/56 BP, 100% SpO2 Post-Mobility: 69 HR, 146/58 BP, 100% SpO2  Pt was able to transfer to/from [facility] but differed ambulation in and out of room due to dizziness. Pt kept her eyes closed during majority of session. She said it made her feel better. VSS during transfer. Pt also said she hasn't ate a meal since Monday evening, RN notified. Pt back to bed, RN present in room.   [facility] Kasiya Burck Mobility Specialist Mobility Specialist Phone: [phone]

## 2020-05-03 NOTE — Progress Notes (Signed)
v

## 2020-05-03 NOTE — Progress Notes (Signed)
Mobility Specialist: Progress Note   05/03/20 1516  Mobility  Activity Ambulated in room  Level of Assistance Standby assist, set-up cues, supervision of patient - no hands on  Assistive Device Front wheel walker  Distance Ambulated (ft) 20 ft  Mobility Response Tolerated fair  Mobility performed by Mobility specialist  Bed Position Chair  $Mobility charge 1 Mobility   Post-Mobility: 70 HR, 196/75 BP, 99% SpO2  Pt c/o dizziness still after eating lunch but agreeable to in room ambulation. Pt walked around the end of the bed to the chair. Pt c/o feeling warm after walking to chair as well. Pt sitting in chair with family in room and call bell at her side. RN notified of BP.   [facility] [employer] Mobility Specialist Mobility Specialist Phone: [phone]

## 2020-05-03 NOTE — Discharge Instructions (Signed)
   [facility]   Discharge Instructions   Carotid Surgery  Please refer to the following instructions for your post-procedure care. Your surgeon or physician assistant will discuss any changes with you.  Activity  You are encouraged to walk as much as you can. You can slowly return to normal activities but must avoid strenuous activity and heavy lifting until your doctor tell you it's okay. Avoid activities such as vacuuming or swinging a golf club. You can drive after one week if you are comfortable and you are no longer taking prescription pain medications. It is normal to feel tired for serval weeks after your surgery. It is also normal to have difficulty with sleep habits, eating, and bowel movements after surgery. These will go away with time.  Bathing/Showering  Shower daily after you go home. Do not soak in a bathtub, hot tub, or swim until the incision heals completely.  Incision Care  Shower every day. Clean your incision with mild soap and water. Pat the area dry with a clean towel. You do not need a bandage unless otherwise instructed. Do not apply any ointments or creams to your incision. You may have skin glue on your incision. Do not peel it off. It will come off on its own in about one week. Your incision may feel thickened and raised for several weeks after your surgery. This is normal and the skin will soften over time.   For Men Only: It's okay to shave around the incision but do not shave the incision itself for 2 weeks. It is common to have numbness under your chin that could last for several months.  Diet  Resume your normal diet. There are no special food restrictions following this procedure. A low fat/low cholesterol diet is recommended for all patients with vascular disease. In order to heal from your surgery, it is CRITICAL to get adequate nutrition. Your body requires vitamins, minerals, and protein. Vegetables are the best source of  vitamins and minerals. Vegetables also provide the perfect balance of protein. Processed food has little nutritional value, so try to avoid this.  Medications  Resume taking all of your medications unless your doctor or physician assistant tells you not to. If your incision is causing pain, you may take over-the- counter pain relievers such as acetaminophen (Tylenol). If you were prescribed a stronger pain medication, please be aware these medications can cause nausea and constipation. Prevent nausea by taking the medication with a snack or meal. Avoid constipation by drinking plenty of fluids and eating foods with a high amount of fiber, such as fruits, vegetables, and grains.   Do not take Tylenol if you are taking prescription pain medications.  Follow Up  Our office will schedule a follow up appointment 2-3 weeks following discharge.  Please call us immediately for any of the following conditions  . Increased pain, redness, drainage (pus) from your incision site. . Fever of 101 degrees or higher. . If you should develop stroke (slurred speech, difficulty swallowing, weakness on one side of your body, loss of vision) you should call 911 and go to the nearest emergency room. .  Reduce your risk of vascular disease:  . Stop smoking. If you would like help call [facility] at 1-800-QUIT-NOW (1-800-784-8669) or [place] at 336-586-4000. . Manage your cholesterol . Maintain a desired weight . Control your diabetes . Keep your blood pressure down .  If you have any questions, please call the office at [phone]. 

## 2020-05-03 NOTE — Progress Notes (Addendum)
  Progress Note    05/03/2020 8:01 AM 1 Day Post-Op  Subjective:  Nauseated this morning after pain medication. States she slept well and pain is better controlled   Vitals:   05/03/20 0400 05/03/20 0738  BP: 140/63 (!) 162/63  Pulse: 80 82  Resp: 15 16  Temp: 97.8 F (36.6 C) 97.9 F (36.6 C)  SpO2: 99%    Physical Exam: Cardiac:  regular Lungs: non labored Incisions: right neck incision is clean, dry and intact. No swelling or hematoma Extremities: moving all extremities without deficit Abdomen:  Flat,soft, non tender Neurologic: alert and oriented. CN intact. Smile symmetric, tongue midline, speech coherent, sensation intact and equal bilaterally  CBC    Component Value Date/Time   WBC 9.6 05/03/2020 0211   RBC 3.77 (L) 05/03/2020 0211   HGB 11.9 (L) 05/03/2020 0211   HCT 36.0 05/03/2020 0211   PLT 180 05/03/2020 0211   MCV 95.5 05/03/2020 0211   MCH 31.6 05/03/2020 0211   MCHC 33.1 05/03/2020 0211   RDW 12.1 05/03/2020 0211    BMET    Component Value Date/Time   NA 137 05/03/2020 0211   K 3.6 05/03/2020 0211   CL 107 05/03/2020 0211   CO2 21 (L) 05/03/2020 0211   GLUCOSE 65 (L) 05/03/2020 0211   BUN 12 05/03/2020 0211   CREATININE 0.58 05/03/2020 0211   CALCIUM 8.4 (L) 05/03/2020 0211   GFRNONAA >60 05/03/2020 0211    INR    Component Value Date/Time   INR 1.0 05/02/2020 0432     Intake/Output Summary (Last 24 hours) at 05/03/2020 0801 Last data filed at 05/03/2020 0231 Gross per 24 hour  Intake 1908.75 ml  Output 450 ml  Net 1458.75 ml     Assessment/Plan:  75 y.o. female is s/p right carotid endarterectomy 1 Day Post-Op. Neurologically intact. Right neck incision c/d/i. Continue Aspirin, and Plavix x 3 weeks then Aspirin alone per neurology. Statin indefinitely. Continue adequate blood pressure control.  Medical management per primary team. Advance diet as tolerated and mobilize today. PT/ OT do not recommend any follow up. She is doing well  from vascular standpoint. She will have follow up arranged in 2 weeks in our office   Karoline Caldwell, [place] [facility] [phone] 05/03/2020 8:01 AM    I agree with the above.  I have seen and examined the patient. She is POD#1, s/p right CEA.  She is neuro intact.  Cleared for d/c from vascular perspective  [place]

## 2020-05-03 NOTE — Progress Notes (Signed)
PROGRESS NOTE    Wendy Jackson  AST:[id number] DOB: Jul 08, 1945 DOA: 04/30/2020 PCP: Ronnald Nian, DO    Chief Complaint  Patient presents with  . Dizziness  . Weakness    Brief Narrative:  75 year old history hypothyroidism, lupus presented to the ED for left-sided numbness and weakness, gait abnormality with she was noted to have a small acute infarct involving the right precentral postcentral secondary to right ICA stenosis with high risk plaque. Neurology consulted and recommended 30 day event monitorning to rule out atrial fib.  Currently on DAPT with aspirin and palvix for 3 weeks followed by aspirin alone.   Assessment & Plan:   Principal Problem:   Acute ischemic stroke [facility]) Active Problems:   Acquired hypothyroidism   Lupus (HCC)   Numbness of left hand   Allergic rhinitis   CVA (cerebral vascular accident) ([place])   Acute infarct involving the right precentral and post central gyri,  probably embolic,  MRI brain showing 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.  CT angio of the head and neck showed No intracranial arterial occlusion or high-grade stenosis. 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 Currently on aspirin and plavix for 3 weeks followed by Asprin alone. Continue with lipitor 40 mg daily.  Lower extremity duplex is negative for acute DVT.  Echo showed LVEF of 60 to 65% no regional wall abnormalities. Grade 1 diastolic dysfunction. No atrial level shunt.  Vascular surgery consulted and she is scheduled to undergo right carotid endarterectomy.  Therapy evaluations recommending no PT follow up.  Pt seen today, reports persistent nausea and dizziness, unable to take anything by mouth, without improvementwith zofran. Will try phenergan IV and monitor overnight.    Lupus:  Resume home meds.   Hypothydoidism: Resume home meds.   Permissive  hypertension:     DVT prophylaxis: SCDs Code Status: Full code Family Communication: None Disposition:   Status is: Inpatient.   The patient will require care spanning > 2 midnights and should be moved to inpatient because: Ongoing diagnostic testing needed not appropriate for outpatient work up  Dispo: The patient is from: Home              Anticipated d/c is to: Pending              Patient currently is not medically stable to d/c.   Difficult to place patient No       Consultants:   Neurology  Vascular surgery.    Procedures: scheduled for right carotid endarterectomy    Antimicrobials: none.    Subjective: Persistent nausea, and dizziness.   Objective: Vitals:   05/03/20 0400 05/03/20 0738 05/03/20 1232 05/03/20 1524  BP: 140/63 (!) 162/63 (!) 146/58 (!) 167/63  Pulse: 80 82 73 67  Resp: 15 16 16 15   Temp: 97.8 F (36.6 C) 97.9 F (36.6 C) (!) 97.5 F (36.4 C) 97.9 F (36.6 C)  TempSrc: Oral Oral Oral Oral  SpO2: 99%  98% 100%  Weight:      Height:        Intake/Output Summary (Last 24 hours) at 05/03/2020 1546 Last data filed at 05/03/2020 1258 Gross per 24 hour  Intake 808.75 ml  Output 700 ml  Net 108.75 ml   Filed Weights   04/30/20 1817 05/02/20 0500 05/02/20 1525  Weight: 45.4 kg 40.3 kg 40.2 kg    Examination:  General exam: mild distress from nausea, no vomiting.  Respiratory system: Clear to auscultation, no wheezing or rhonchi.  Cardiovascular system: S1S2 heard, RRR, no JVD, no pedal edema.  Gastrointestinal system: Abdomen is soft, non tender non distended bowel sounds  Central nervous system: Alert and oriented, but with persistent dizziness.  Extremities: no pedal edema.  Skin: No rashes seen.  Psychiatry: . Mood is appropriate.     Data Reviewed: I have personally reviewed following labs and imaging studies  CBC: Recent Labs  Lab 04/30/20 1844 05/01/20 0440 05/02/20 0432 05/03/20 0211  WBC 6.2 6.1 6.2 9.6  HGB  13.3 12.3 12.9 11.9*  HCT 40.8 37.4 39.0 36.0  MCV 96.7 94.9 94.7 95.5  PLT 199 172 173 923    Basic Metabolic Panel: Recent Labs  Lab 04/30/20 1844 05/01/20 0440 05/02/20 0432 05/03/20 0211  NA 139 138 135 137  K 3.9 3.7 3.4* 3.6  CL 103 105 104 107  CO2 26 26 23  21*  GLUCOSE 111* 91 78 65*  BUN 18 13 15 12   CREATININE 0.65 0.44 0.67 0.58  CALCIUM 8.9 8.9 8.8* 8.4*  MG  --  2.1  --   --     GFR: Estimated Creatinine Clearance: 39.7 mL/min (by C-G formula based on SCr of 0.58 mg/dL).  Liver Function Tests: Recent Labs  Lab 04/30/20 1845  AST 36  ALT 30  ALKPHOS 68  BILITOT 0.8  PROT 6.6  ALBUMIN 4.0    CBG: Recent Labs  Lab 04/30/20 2027  GLUCAP 91     Recent Results (from the past 240 hour(s))  Urine Culture     Status: Abnormal   Collection Time: 04/30/20 11:23 PM   Specimen: Urine, Random  Result Value Ref Range Status   Specimen Description   Final    URINE, RANDOM Performed at [facility] [address], [place], [place] [address]    Special Requests   Final    NONE Performed at [facility], [place] [address] [place], [place] [address]    Culture (A)  Final    50,000 COLONIES/mL GROUP B STREP(S.AGALACTIAE)ISOLATED TESTING AGAINST S. AGALACTIAE NOT ROUTINELY PERFORMED DUE TO PREDICTABILITY OF AMP/PEN/VAN SUSCEPTIBILITY. Performed at [facility], [place] [address], [place], [place] [address]    Report Status 05/02/2020 FINAL  Final  Resp Panel by RT-PCR (Flu A&B, Covid) Nasopharyngeal Swab     Status: None   Collection Time: 04/30/20 11:29 PM   Specimen: Nasopharyngeal Swab; Nasopharyngeal(NP) swabs in vial transport medium  Result Value Ref Range Status   SARS Coronavirus 2 by RT PCR NEGATIVE NEGATIVE Final    Comment: (NOTE) SARS-CoV-2 target nucleic acids are NOT DETECTED.  The SARS-CoV-2 RNA is generally detectable in upper respiratory specimens during the acute phase of infection. The  lowest concentration of SARS-CoV-2 viral copies this assay can detect is 138 copies/mL. A negative result does not preclude SARS-Cov-2 infection and should not be used as the sole basis for treatment or other patient management decisions. A negative result may occur with  improper specimen collection/handling, submission of specimen other than nasopharyngeal swab, presence of viral mutation(s) within the areas targeted by this assay, and inadequate number of viral copies(<138 copies/mL). A negative result must be combined with clinical observations, patient history, and epidemiological information. The expected result is Negative.  Fact Sheet for Patients:  EntrepreneurPulse.com.au  Fact Sheet for Healthcare Providers:  IncredibleEmployment.be  This test is no t yet approved or cleared by the [place] FDA and  has  been authorized for detection and/or diagnosis of SARS-CoV-2 by FDA under an Emergency Use Authorization (EUA). This EUA will remain  in effect (meaning this test can be used) for the duration of the COVID-19 declaration under Section 564(b)(1) of the Act, 21 U.S.C.section 360bbb-3(b)(1), unless the authorization is terminated  or revoked sooner.       Influenza A by PCR NEGATIVE NEGATIVE Final   Influenza B by PCR NEGATIVE NEGATIVE Final    Comment: (NOTE) The Xpert Xpress SARS-CoV-2/FLU/RSV plus assay is intended as an aid in the diagnosis of influenza from Nasopharyngeal swab specimens and should not be used as a sole basis for treatment. Nasal washings and aspirates are unacceptable for Xpert Xpress SARS-CoV-2/FLU/RSV testing.  Fact Sheet for Patients: EntrepreneurPulse.com.au  Fact Sheet for Healthcare Providers: IncredibleEmployment.be  This test is not yet approved or cleared by the [place] FDA and has been authorized for detection and/or diagnosis of SARS-CoV-2 by FDA under  an Emergency Use Authorization (EUA). This EUA will remain in effect (meaning this test can be used) for the duration of the COVID-19 declaration under Section 564(b)(1) of the Act, 21 U.S.C. section 360bbb-3(b)(1), unless the authorization is terminated or revoked.  Performed at [facility], [place] [address], [place], [place] [address]   Surgical pcr screen     Status: None   Collection Time: 05/02/20  4:27 AM   Specimen: Nasal Mucosa; Nasal Swab  Result Value Ref Range Status   MRSA, PCR NEGATIVE NEGATIVE Final   Staphylococcus aureus NEGATIVE NEGATIVE Final    Comment: (NOTE) The Xpert SA Assay (FDA approved for NASAL specimens in patients 41 years of age and older), is one component of a comprehensive surveillance program. It is not intended to diagnose infection nor to guide or monitor treatment. Performed at [facility], [place] [address], [place], [place] [address]          Radiology Studies: VAS [place] LOWER EXTREMITY VENOUS (DVT)  Result Date: 05/02/2020  Lower Venous DVT Study Indications: Stroke.  Comparison Study: no prior Performing Technologist: Abram Sander RVS  Examination Guidelines: A complete evaluation includes B-mode imaging, spectral Doppler, color Doppler, and power Doppler as needed of all accessible portions of each vessel. Bilateral testing is considered an integral part of a complete examination. Limited examinations for reoccurring indications may be performed as noted. The reflux portion of the exam is performed with the patient in reverse Trendelenburg.  +---------+---------------+---------+-----------+----------+-------------------+ RIGHT    CompressibilityPhasicitySpontaneityPropertiesThrombus Aging      +---------+---------------+---------+-----------+----------+-------------------+ CFV      Full           Yes      Yes                                       +---------+---------------+---------+-----------+----------+-------------------+ SFJ      Full                                                             +---------+---------------+---------+-----------+----------+-------------------+ FV Prox  Full                                                             +---------+---------------+---------+-----------+----------+-------------------+  FV Mid   Full                                                             +---------+---------------+---------+-----------+----------+-------------------+ FV DistalFull                                                             +---------+---------------+---------+-----------+----------+-------------------+ PFV      Full                                                             +---------+---------------+---------+-----------+----------+-------------------+ POP      Full           Yes      Yes                                      +---------+---------------+---------+-----------+----------+-------------------+ PTV      Full                                                             +---------+---------------+---------+-----------+----------+-------------------+ PERO                                                  Not well visualized +---------+---------------+---------+-----------+----------+-------------------+   +---------+---------------+---------+-----------+----------+-------------------+ LEFT     CompressibilityPhasicitySpontaneityPropertiesThrombus Aging      +---------+---------------+---------+-----------+----------+-------------------+ CFV      Full           Yes      Yes                                      +---------+---------------+---------+-----------+----------+-------------------+ SFJ      Full                                                             +---------+---------------+---------+-----------+----------+-------------------+ FV  Prox  Full                                                             +---------+---------------+---------+-----------+----------+-------------------+ FV Mid   Full                                                             +---------+---------------+---------+-----------+----------+-------------------+  FV DistalFull                                                             +---------+---------------+---------+-----------+----------+-------------------+ PFV      Full                                                             +---------+---------------+---------+-----------+----------+-------------------+ POP      Full           Yes      Yes                                      +---------+---------------+---------+-----------+----------+-------------------+ PTV      Full                                                             +---------+---------------+---------+-----------+----------+-------------------+ PERO                                                  Not well visualized +---------+---------------+---------+-----------+----------+-------------------+     Summary: BILATERAL: - No evidence of deep vein thrombosis seen in the lower extremities, bilaterally. - No evidence of superficial venous thrombosis in the lower extremities, bilaterally. -No evidence of popliteal cyst, bilaterally.   *See table(s) above for measurements and observations. Electronically signed by Jamelle Haring on 05/02/2020 at 4:38:39 PM.    Final         Scheduled Meds: .  stroke: mapping our early stages of recovery book   Does not apply Once  . aspirin  81 mg Oral Daily  . atorvastatin  40 mg Oral Daily  . clopidogrel  75 mg Oral Daily  . hydroxychloroquine  200 mg Oral Daily  . loratadine  10 mg Oral Daily  . pantoprazole  40 mg Oral Daily  . thyroid  30 mg Oral Daily   Continuous Infusions: . sodium chloride    . sodium chloride Stopped (05/03/20 0231)  .  promethazine (PHENERGAN) injection (IM or IVPB)       LOS: 1 day        Hosie Poisson, MD [facility]   To contact the attending provider between 7A-7P or the covering provider during after hours 7P-7A, please log into the web site [email or web address] and access using universal [place] password for that web site. If you do not have the password, please call the hospital operator.  05/03/2020, 3:46 PM

## 2020-05-03 NOTE — Progress Notes (Signed)
OT Cancellation Note  Patient Details Name: Shauntavia Brackin MRN: [id number] DOB: 20-Dec-1945   Cancelled Treatment:    Reason Eval/Treat Not Completed: Patient declined, discharge canceled due to persistent dizziness with any changes in position.  OT to continue as appropriate.    Ross Bender D [place] 05/03/2020, 3:35 PM

## 2020-05-03 NOTE — Progress Notes (Signed)
Labetalol 10mg  given for BP=167/63. Will continue to monitor pt.   Lavenia Atlas, RN

## 2020-05-04 MED ORDER — ARMOUR THYROID 30 MG PO TABS: 30.0000 mg | ORAL_TABLET | Freq: Every day | ORAL | 3 refills | Status: DC

## 2020-05-04 MED ORDER — CLOPIDOGREL BISULFATE 75 MG PO TABS: 75.0000 mg | ORAL_TABLET | Freq: Every day | ORAL | 0 refills | Status: DC

## 2020-05-04 MED ORDER — ATORVASTATIN CALCIUM 10 MG PO TABS: 40.0000 mg | ORAL_TABLET | Freq: Every day | ORAL | 3 refills | Status: DC

## 2020-05-04 MED ORDER — ASPIRIN 81 MG PO CHEW: 81.0000 mg | CHEWABLE_TABLET | Freq: Every day | ORAL | 3 refills | Status: DC

## 2020-05-04 MED ORDER — AMLODIPINE BESYLATE 5 MG PO TABS: 5.0000 mg | ORAL_TABLET | Freq: Every day | ORAL | 2 refills | Status: DC

## 2020-05-04 MED ORDER — HYDRALAZINE HCL 20 MG/ML IJ SOLN: 5.0000 mg | INTRAMUSCULAR | Status: DC | PRN

## 2020-05-04 NOTE — Progress Notes (Signed)
D/c tele and IVs. Went over AVS with pt and all questions were answered.  ° °Sedona Wenk S Tajae Maiolo, RN ° °

## 2020-05-04 NOTE — Progress Notes (Addendum)
  Progress Note    05/04/2020 7:11 AM 2 Days Post-Op  Subjective:  Feels much better today.  Says she has some numbness around her jaw.  Ear pain she had after surgery is essentially resolved.   Afebrile HR 60's-90's NSR 222'L-798'X systolic 211% RA  Vitals:   05/03/20 2325 05/04/20 0328  BP: (!) 146/62 (!) 161/77  Pulse: 84 76  Resp: 16 15  Temp: 97.7 F (36.5 C) 97.9 F (36.6 C)  SpO2: 100% 100%     Physical Exam: Neuro:  In tact; tongue is midline Lungs:  Non labored Incision:  Clean and dry; no hematoma  CBC    Component Value Date/Time   WBC 9.6 05/03/2020 0211   RBC 3.77 (L) 05/03/2020 0211   HGB 11.9 (L) 05/03/2020 0211   HCT 36.0 05/03/2020 0211   PLT 180 05/03/2020 0211   MCV 95.5 05/03/2020 0211   MCH 31.6 05/03/2020 0211   MCHC 33.1 05/03/2020 0211   RDW 12.1 05/03/2020 0211    BMET    Component Value Date/Time   NA 137 05/03/2020 0211   K 3.6 05/03/2020 0211   CL 107 05/03/2020 0211   CO2 21 (L) 05/03/2020 0211   GLUCOSE 65 (L) 05/03/2020 0211   BUN 12 05/03/2020 0211   CREATININE 0.58 05/03/2020 0211   CALCIUM 8.4 (L) 05/03/2020 0211   GFRNONAA >60 05/03/2020 0211     Intake/Output Summary (Last 24 hours) at 05/04/2020 0711 Last data filed at 05/03/2020 1258 Gross per 24 hour  Intake --  Output 400 ml  Net -400 ml     Assessment/Plan:  This is a 75 y.o. female who is s/p right CEA 2 Days Post-Op  -pt is doing well this am.  Feeling much better today.  Nausea resolved.  -pt neuro exam is in tact -pt will f/u with [facility] in 3-4 weeks.  -ok for d/c from vascular standpoint.    Leontine Locket, PA-C [facility] [phone]  I have examined the patient, reviewed and agree with above.  [name], MD 05/04/2020 2:44 PM

## 2020-05-04 NOTE — Evaluation (Signed)
Physical Therapy Evaluation and Discharge Patient Details Name: Wendy Jackson MRN: [id number] DOB: [birth year] Today's Date: 05/04/2020   History of Present Illness  Pt is a 75 y/o female presenting to ED for evaluation of new onset L hand numbness and weaknesss, presyncopal sensation and unsteady gait.  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  Post CEA, pt very nauseas and dizzy and continues with new balance deficits.  PMH of lupus and thyroid disease  Clinical Impression  Patient evaluated by Physical Therapy with no further acute PT needs identified. All education has been completed and the patient has no further questions. Pt with mild unsteadiness, but no overt LOB noted. Requiring supervision for transfers and gait and min guard with HHA for stair navigation. Pt's husband present and practiced assisting pt with stair navigation. Educated about walking program for home and use of RW for increased safety. See below for any follow-up Physical Therapy or equipment needs. PT is signing off. Thank you for this referral. If needs change, please re-consult.      Follow Up Recommendations No PT follow up    Equipment Recommendations  None recommended by PT    Recommendations for Other Services       Precautions / Restrictions Precautions Precautions: Fall Restrictions Weight Bearing Restrictions: No      Mobility  Bed Mobility Overal bed mobility: Independent             General bed mobility comments: Pt mobilizes in bed well.    Transfers Overall transfer level: Needs assistance Equipment used: None Transfers: Sit to/from Stand Sit to Stand: Supervision         General transfer comment: Supervision for safety. No overt LOB upon standing.  Ambulation/Gait Ambulation/Gait assistance: Supervision Gait Distance (Feet): 150 Feet Assistive device: None Gait  Pattern/deviations: Drifts right/left;Decreased stride length;Step-through pattern Gait velocity: decreased   General Gait Details: Slower gait speed and cautious. Mild instability noted, but no overt LOB noted. Educated about using RW at home, espeically at night, to increase safety.  Stairs Stairs: Yes Stairs assistance: Min guard Stair Management: Step to pattern;Forwards (HHA) Number of Stairs: 2 General stair comments: Used HHA for stability and had pt's husband practice assisting. Min guard for safety. No overt LOB noted.  Wheelchair Mobility    Modified Rankin (Stroke Patients Only) Modified Rankin (Stroke Patients Only) Pre-Morbid Rankin Score: No symptoms Modified Rankin: Moderately severe disability     Balance Overall balance assessment: Mild deficits observed, not formally tested                                           Pertinent Vitals/Pain Pain Assessment: Faces Faces Pain Scale: Hurts a little bit Pain Location: headache Pain Descriptors / Indicators: Headache Pain Intervention(s): Limited activity within patient's tolerance;Monitored during session;Repositioned    Home Living Family/patient expects to be discharged to:: Private residence Living Arrangements: Spouse/significant other Available Help at Discharge: Family;Available 24 hours/day Type of Home: House Home Access: Stairs to enter Entrance Stairs-Rails: None Entrance Stairs-Number of Steps: 2 Home Layout: One level Home Equipment: Shower seat - built in;Walker - 2 wheels      Prior Function Level of Independence: Independent         Comments: drives     Hand Dominance   Dominant Hand:  Right    Extremity/Trunk Assessment   Upper Extremity Assessment Upper Extremity Assessment: Defer to OT evaluation    Lower Extremity Assessment Lower Extremity Assessment: Generalized weakness    Cervical / Trunk Assessment Cervical / Trunk Assessment: Normal   Communication   Communication: No difficulties  Cognition Arousal/Alertness: Awake/alert Behavior During Therapy: WFL for tasks assessed/performed Overall Cognitive Status: Within Functional Limits for tasks assessed                                        General Comments General comments (skin integrity, edema, etc.): Pt's husband present during session    Exercises     Assessment/Plan    PT Assessment Patent does not need any further PT services  PT Problem List         PT Treatment Interventions      PT Goals (Current goals can be found in the Care Plan section)  Acute Rehab PT Goals Patient Stated Goal: to go home PT Goal Formulation: All assessment and education complete, DC therapy Time For Goal Achievement: 05/04/20 Potential to Achieve Goals: Good    Frequency     Barriers to discharge        Co-evaluation               AM-PAC PT "6 Clicks" Mobility  Outcome Measure Help needed turning from your back to your side while in a flat bed without using bedrails?: None Help needed moving from lying on your back to sitting on the side of a flat bed without using bedrails?: None Help needed moving to and from a bed to a chair (including a wheelchair)?: None Help needed standing up from a chair using your arms (e.g., wheelchair or bedside chair)?: None Help needed to walk in hospital room?: None Help needed climbing 3-5 steps with a railing? : A Little 6 Click Score: 23    End of Session Equipment Utilized During Treatment: Gait belt Activity Tolerance: Patient tolerated treatment well Patient left: in chair;with call bell/phone within reach;with family/visitor present Nurse Communication: Mobility status PT Visit Diagnosis: Muscle weakness (generalized) (M62.81);Unsteadiness on feet (R26.81)    Time: 3235-5732 PT Time Calculation (min) (ACUTE ONLY): 10 min   Charges:   PT Evaluation $PT Eval Low Complexity: 1 Low           Lou Miner, DPT  Acute Rehabilitation Services  Pager: [phone] Office: [phone]   Rudean Hitt 05/04/2020, 2:45 PM

## 2020-05-04 NOTE — Discharge Summary (Signed)
Physician Discharge Summary  Wendy Jackson OFB:[id number] DOB: 01-Sep-1945 DOA: 04/30/2020  PCP: Ronnald Nian, DO  Admit date: 04/30/2020 Discharge date: 05/04/2020  Admitted From: Home  Disposition:  Home.   Recommendations for Outpatient Follow-up:  1. Follow up with PCP in 1-2 weeks 2. Please obtain BMP/CBC in one week Please follow up with vascular surgery as scheduled  Please follow up with Neurology in 2 to 4 weeks as recommended.   Discharge Condition:stable.  CODE STATUS: Full code Diet recommendation: Heart Healthy   Brief/Interim Summary: 75 year old history hypothyroidism, lupus presented to the ED for left-sided numbness and weakness, gait abnormality with she was noted to have a small acute infarct involving the right precentral postcentral secondary to right ICA stenosis with high risk plaque. Neurology consulted and recommended 30 day event monitorning to rule out atrial fib.  Currently on DAPT with aspirin and palvix for 3 weeks followed by aspirin alone.    Discharge Diagnoses:  Principal Problem:   Acute ischemic stroke [facility]) Active Problems:   Acquired hypothyroidism   Lupus (HCC)   Numbness of left hand   Allergic rhinitis   CVA (cerebral vascular accident) ([place])  Acute infarct involving the right precentral and post central gyri,  probably embolic,  MRI brain showing 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.  CT angio of the head and neck showed No intracranial arterial occlusion or high-grade stenosis. 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 Currently on aspirin and plavix for 3 weeks followed by Asprin alone. Continue with lipitor 40 mg daily.  Lower extremity duplex is negative for acute DVT.  Echo showed LVEF of 60 to 65% no regional wall abnormalities. Grade 1 diastolic dysfunction. No atrial level shunt.  Vascular surgery consulted and she  is scheduled to undergo right carotid endarterectomy.  Therapy evaluations recommending no PT follow up.  Recommend outpatient follow up with cardiology for 30 day event monitor placement.      Lupus:  Resume home meds.   Hypothydoidism: Resume home meds.   Hypertension.  Start the patient on norvasc 5 mg daily starting tomorrow.     Discharge Instructions  Discharge Instructions    Ambulatory referral to Cardiology   Complete by: As directed    For 30 day cardiac monitor placement for evaluation of atrial fibrillation.   Ambulatory referral to Neurology   Complete by: As directed    Follow up with stroke clinic NP (Jessica Vanschaick or Cecille Rubin, if both not available, consider Zachery Dauer, or Ahern) at [facility] in about 4 weeks. Thanks.   Diet - low sodium heart healthy   Complete by: As directed    Diet - low sodium heart healthy   Complete by: As directed    Discharge instructions   Complete by: As directed    Please follow up with neurology as recommended.   Increase activity slowly   Complete by: As directed    Increase activity slowly   Complete by: As directed    No wound care   Complete by: As directed    No wound care   Complete by: As directed      Allergies as of 05/04/2020      Reactions   Augmentin [amoxicillin-pot Clavulanate] Other (See Comments)   unk   Tramadol    Nausea   Flexeril [cyclobenzaprine] Palpitations      Medication List    STOP taking these medications  VITAMIN K PO     TAKE these medications   Airborne Elderberry Chew Chew 1 tablet by mouth in the morning and at bedtime.   CURCUMAX PRO PO Take 250 mg by mouth daily.   amLODipine 5 MG tablet Commonly known as: NORVASC Take 1 tablet (5 mg total) by mouth daily. Start taking on: May 05, 2020   Armour Thyroid 30 MG tablet Generic drug: thyroid Take 1 tablet (30 mg total) by mouth daily.   aspirin 81 MG chewable tablet Chew 1 tablet (81 mg total) by  mouth daily.   atorvastatin 10 MG tablet Commonly known as: LIPITOR Take 4 tablets (40 mg total) by mouth daily.   cetirizine 10 MG chewable tablet Commonly known as: ZYRTEC Chew 10 mg by mouth daily.   clopidogrel 75 MG tablet Commonly known as: PLAVIX Take 1 tablet (75 mg total) by mouth daily.   HAWTHORNE PO Take 1 tablet by mouth daily.   hydroxychloroquine 200 MG tablet Commonly known as: PLAQUENIL Take 200 mg by mouth daily.   L-Carnitine 500 MG Caps Take 500 mg by mouth daily.   Lumigan 0.01 % Soln Generic drug: bimatoprost Place 1 drop into both eyes at bedtime.   Magnesium Citrate 100 MG Tabs Take 150 mg by mouth. Pt taking 1.5 dose   Resveratrol 50 MG Caps Take 50 mg by mouth daily.   Simbrinza 1-0.2 % Susp Generic drug: Brinzolamide-Brimonidine Place 1 drop into both eyes in the morning, at noon, and at bedtime.   Ubiquinol 100 MG Caps Take 100 mg by mouth.   UP4 PROBIOTICS WOMENS PO Take 1 capsule by mouth daily.   Vitamin D3 1.25 MG (50000 UT) Tabs Take 5,000 Units by mouth daily.       Follow-up Information    [facility]. Schedule an appointment as soon as possible for a visit in 4 week(s).   Specialty: Neurology Contact information: [place] In 3 weeks.   Why:  Office will call you to arrange your appt (sent) Contact information: [place] [phone]       [facility]. Schedule an appointment as soon as possible for a visit in 1 day.   Specialty: Cardiology Why: 30 day cardiac event monitor placement.  Contact information: [address], [place] [address] [phone]             Allergies  Allergen Reactions  . Augmentin [Amoxicillin-Pot Clavulanate] Other (See Comments)    unk  . Tramadol     Nausea   .  Flexeril [Cyclobenzaprine] Palpitations    Consultations:  Neurology  Vascular surgery.    Procedures/Studies: CT Angio Head W or Wo Contrast  Result Date: 05/01/2020 CLINICAL DATA:  Weakness, dizziness and left hand numbness EXAM: CT ANGIOGRAPHY HEAD AND NECK TECHNIQUE: Multidetector CT imaging of the head and neck was performed using the standard protocol during bolus administration of intravenous contrast. Multiplanar CT image reconstructions and MIPs were obtained to evaluate the vascular anatomy. Carotid stenosis measurements (when applicable) are obtained utilizing NASCET criteria, using the distal internal carotid diameter as the denominator. CONTRAST:  120mL OMNIPAQUE IOHEXOL 350 MG/ML SOLN COMPARISON:  None. FINDINGS: CT HEAD FINDINGS Brain: There is no mass, hemorrhage or extra-axial collection. The size and configuration of the ventricles and extra-axial CSF spaces are normal. There is no  acute or chronic infarction. The brain parenchyma is normal. Skull: The visualized skull base, calvarium and extracranial soft tissues are normal. Sinuses/Orbits: No fluid levels or advanced mucosal thickening of the visualized paranasal sinuses. No mastoid or middle ear effusion. The orbits are normal. CTA NECK FINDINGS SKELETON: There is no bony spinal canal stenosis. No lytic or blastic lesion. OTHER NECK: Normal pharynx, larynx and major salivary glands. No cervical lymphadenopathy. Unremarkable thyroid gland. UPPER CHEST: No pneumothorax or pleural effusion. No nodules or masses. AORTIC ARCH: There is calcific atherosclerosis of the aortic arch. There is no aneurysm, dissection or hemodynamically significant stenosis of the visualized portion of the aorta. Conventional 3 vessel aortic branching pattern. The visualized proximal subclavian arteries are widely patent. RIGHT CAROTID SYSTEM: No dissection, occlusion or aneurysm. There is mixed density atherosclerosis extending into the proximal ICA,  resulting in 50% stenosis. LEFT CAROTID SYSTEM: No dissection, occlusion or aneurysm. Mild atherosclerotic calcification at the carotid bifurcation without hemodynamically significant stenosis. VERTEBRAL ARTERIES: Left dominant configuration. Both origins are clearly patent. There is no dissection, occlusion or flow-limiting stenosis to the skull base (V1-V3 segments). CTA HEAD FINDINGS POSTERIOR CIRCULATION: --Vertebral arteries: Normal V4 segments. --Inferior cerebellar arteries: Normal. --Basilar artery: Normal. --Superior cerebellar arteries: Normal. --Posterior cerebral arteries (PCA): Normal. ANTERIOR CIRCULATION: --Intracranial internal carotid arteries: Normal. --Anterior cerebral arteries (ACA): Normal. Both A1 segments are present. Patent anterior communicating artery (a-comm). --Middle cerebral arteries (MCA): Normal. VENOUS SINUSES: As permitted by contrast timing, patent. ANATOMIC VARIANTS: None Review of the MIP images confirms the above findings. IMPRESSION: 1. No intracranial arterial occlusion or high-grade stenosis. 2. 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 Aortic Atherosclerosis (ICD10-I70.0). Electronically Signed   By: Ulyses Jarred M.D.   On: 05/01/2020 01:10   DG Chest 2 View  Result Date: 04/30/2020 CLINICAL DATA:  Numbness, left hand weakness EXAM: CHEST - 2 VIEW COMPARISON:  None. FINDINGS: There is hyperinflation of the lungs compatible with COPD. Calcified granuloma in the left upper lobe. Biapical scarring. No acute confluent opacities. Heart is normal size. No acute bony abnormality. IMPRESSION: COPD/chronic changes.  No active disease. Electronically Signed   By: Rolm Baptise M.D.   On: 04/30/2020 19:13   CT Head Wo Contrast  Result Date: 04/30/2020 CLINICAL DATA:  Dizziness, left hand numbness EXAM: CT HEAD WITHOUT CONTRAST TECHNIQUE: Contiguous axial images were obtained from the base of the skull  through the vertex without intravenous contrast. COMPARISON:  None. FINDINGS: Brain: No acute intracranial abnormality. Specifically, no hemorrhage, hydrocephalus, mass lesion, acute infarction, or significant intracranial injury. Vascular: No hyperdense vessel or unexpected calcification. Skull: No acute calvarial abnormality. Sinuses/Orbits: Visualized paranasal sinuses and mastoids clear. Orbital soft tissues unremarkable. Other: None IMPRESSION: No acute intracranial abnormality. Electronically Signed   By: Rolm Baptise M.D.   On: 04/30/2020 19:30   CT Angio Neck W and/or Wo Contrast  Result Date: 05/01/2020 CLINICAL DATA:  Weakness, dizziness and left hand numbness EXAM: CT ANGIOGRAPHY HEAD AND NECK TECHNIQUE: Multidetector CT imaging of the head and neck was performed using the standard protocol during bolus administration of intravenous contrast. Multiplanar CT image reconstructions and MIPs were obtained to evaluate the vascular anatomy. Carotid stenosis measurements (when applicable) are obtained utilizing NASCET criteria, using the distal internal carotid diameter as the denominator. CONTRAST:  140mL OMNIPAQUE IOHEXOL 350 MG/ML SOLN COMPARISON:  None. FINDINGS: CT HEAD FINDINGS Brain: There is no mass, hemorrhage or extra-axial collection. The size and configuration of  the ventricles and extra-axial CSF spaces are normal. There is no acute or chronic infarction. The brain parenchyma is normal. Skull: The visualized skull base, calvarium and extracranial soft tissues are normal. Sinuses/Orbits: No fluid levels or advanced mucosal thickening of the visualized paranasal sinuses. No mastoid or middle ear effusion. The orbits are normal. CTA NECK FINDINGS SKELETON: There is no bony spinal canal stenosis. No lytic or blastic lesion. OTHER NECK: Normal pharynx, larynx and major salivary glands. No cervical lymphadenopathy. Unremarkable thyroid gland. UPPER CHEST: No pneumothorax or pleural effusion. No  nodules or masses. AORTIC ARCH: There is calcific atherosclerosis of the aortic arch. There is no aneurysm, dissection or hemodynamically significant stenosis of the visualized portion of the aorta. Conventional 3 vessel aortic branching pattern. The visualized proximal subclavian arteries are widely patent. RIGHT CAROTID SYSTEM: No dissection, occlusion or aneurysm. There is mixed density atherosclerosis extending into the proximal ICA, resulting in 50% stenosis. LEFT CAROTID SYSTEM: No dissection, occlusion or aneurysm. Mild atherosclerotic calcification at the carotid bifurcation without hemodynamically significant stenosis. VERTEBRAL ARTERIES: Left dominant configuration. Both origins are clearly patent. There is no dissection, occlusion or flow-limiting stenosis to the skull base (V1-V3 segments). CTA HEAD FINDINGS POSTERIOR CIRCULATION: --Vertebral arteries: Normal V4 segments. --Inferior cerebellar arteries: Normal. --Basilar artery: Normal. --Superior cerebellar arteries: Normal. --Posterior cerebral arteries (PCA): Normal. ANTERIOR CIRCULATION: --Intracranial internal carotid arteries: Normal. --Anterior cerebral arteries (ACA): Normal. Both A1 segments are present. Patent anterior communicating artery (a-comm). --Middle cerebral arteries (MCA): Normal. VENOUS SINUSES: As permitted by contrast timing, patent. ANATOMIC VARIANTS: None Review of the MIP images confirms the above findings. IMPRESSION: 1. No intracranial arterial occlusion or high-grade stenosis. 2. 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 Aortic Atherosclerosis (ICD10-I70.0). Electronically Signed   By: Ulyses Jarred M.D.   On: 05/01/2020 01:10   MR BRAIN WO CONTRAST  Result Date: 04/30/2020 CLINICAL DATA:  Dizziness with left hand numbness EXAM: MRI HEAD WITHOUT CONTRAST TECHNIQUE: Multiplanar, multiecho pulse sequences of the brain and surrounding structures were  obtained without intravenous contrast. COMPARISON:  None. 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 No other abnormal diffusion restriction. No acute or chronic hemorrhage. There is multifocal hyperintense T2-weighted signal within the white matter. Parenchymal volume and CSF spaces are normal. The midline structures are normal. Vascular: Major flow voids are preserved. Skull and upper cervical spine: Normal calvarium and skull base. Visualized upper cervical spine and soft tissues are normal. Sinuses/Orbits:No paranasal sinus fluid levels or advanced mucosal thickening. No mastoid or middle ear effusion. Normal orbits. IMPRESSION: 1. 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 No hemorrhage or mass effect. 2. Findings of chronic small vessel ischemia. Electronically Signed   By: Ulyses Jarred M.D.   On: 04/30/2020 22:34   ECHOCARDIOGRAM COMPLETE BUBBLE STUDY  Result Date: 05/01/2020    ECHOCARDIOGRAM REPORT   Patient Name:   SHUNDRIKA REISCHL Date of Exam: 05/01/2020 Medical Rec #:  [id number]   Height:       64.0 in Accession #:    [id number]  Weight:       100.0 lb Date of Birth:  1945/07/18  BSA:          1.457 m Patient Age:    76 years    BP:           158/84 mmHg Patient Gender: F           HR:  88 bpm. Exam Location:  Inpatient Procedure: 2D Echo, Cardiac Doppler, Color Doppler and Saline Contrast Bubble            Study Indications:    Stroke 434.91 / I63.9  History:        Patient has no prior history of Echocardiogram examinations.                 Thyroid disease. History of lupus.  Sonographer:    Darlina Sicilian RDCS Referring Phys: [id number] [place]  1. Left ventricular ejection fraction, by estimation, is 60 to 65%. The left ventricle has normal function. The left ventricle has no regional wall motion abnormalities. Left ventricular diastolic parameters are consistent with Grade I diastolic  dysfunction (impaired relaxation). The average left ventricular global longitudinal strain is -18.8 %. The global longitudinal strain is normal.  2. Right ventricular systolic function is normal. The right ventricular size is normal.  3. The mitral valve is normal in structure. No evidence of mitral valve regurgitation. No evidence of mitral stenosis. There is mild late systolic prolapse of the middle scallop of the posterior leaflet of the mitral valve.  4. The aortic valve is normal in structure. Aortic valve regurgitation is not visualized. No aortic stenosis is present.  5. The inferior vena cava is normal in size with greater than 50% respiratory variability, suggesting right atrial pressure of 3 mmHg. FINDINGS  Left Ventricle: Left ventricular ejection fraction, by estimation, is 60 to 65%. The left ventricle has normal function. The left ventricle has no regional wall motion abnormalities. The average left ventricular global longitudinal strain is -18.8 %. The global longitudinal strain is normal. The left ventricular internal cavity size was normal in size. There is no left ventricular hypertrophy. Left ventricular diastolic parameters are consistent with Grade I diastolic dysfunction (impaired relaxation). Normal left ventricular filling pressure. Right Ventricle: The right ventricular size is normal. No increase in right ventricular wall thickness. Right ventricular systolic function is normal. Left Atrium: Left atrial size was normal in size. Right Atrium: Right atrial size was normal in size. Pericardium: There is no evidence of pericardial effusion. Mitral Valve: The mitral valve is normal in structure. There is mild late systolic prolapse of the middle scallop of the posterior leaflet of the mitral valve. No evidence of mitral valve regurgitation. No evidence of mitral valve stenosis. Tricuspid Valve: The tricuspid valve is normal in structure. Tricuspid valve regurgitation is not demonstrated. No  evidence of tricuspid stenosis. Aortic Valve: The aortic valve is normal in structure. Aortic valve regurgitation is not visualized. No aortic stenosis is present. Pulmonic Valve: The pulmonic valve was not well visualized. Pulmonic valve regurgitation is not visualized. No evidence of pulmonic stenosis. Aorta: The aortic root is normal in size and structure. Venous: The inferior vena cava is normal in size with greater than 50% respiratory variability, suggesting right atrial pressure of 3 mmHg. IAS/Shunts: No atrial level shunt detected by color flow Doppler. Agitated saline contrast was given intravenously to evaluate for intracardiac shunting.  LEFT VENTRICLE PLAX 2D LVIDd:         3.80 cm  Diastology LVIDs:         2.60 cm  LV e' medial:    6.53 cm/s LV PW:         0.70 cm  LV E/e' medial:  12.6 LV IVS:        0.80 cm  LV e' lateral:   8.02 cm/s LVOT diam:  1.70 cm  LV E/e' lateral: 10.2 LV SV:         37 LV SV Index:   25       2D Longitudinal Strain LVOT Area:     2.27 cm 2D Strain GLS Avg:     -18.8 %  RIGHT VENTRICLE RV S prime:     15.70 cm/s TAPSE (M-mode): 2.3 cm LEFT ATRIUM             Index       RIGHT ATRIUM          Index LA diam:        2.50 cm 1.72 cm/m  RA Area:     6.99 cm LA Vol (A2C):   27.8 ml 19.08 ml/m RA Volume:   10.50 ml 7.21 ml/m LA Vol (A4C):   14.0 ml 9.61 ml/m LA Biplane Vol: 20.6 ml 14.14 ml/m  AORTIC VALVE LVOT Vmax:   85.80 cm/s LVOT Vmean:  60.100 cm/s LVOT VTI:    0.163 m  AORTA Ao Root diam: 3.00 cm MITRAL VALVE MV Area (PHT): 3.60 cm     SHUNTS MV Decel Time: 211 msec     Systemic VTI:  0.16 m MV E velocity: 82.00 cm/s   Systemic Diam: 1.70 cm MV A velocity: 102.00 cm/s MV E/A ratio:  0.80 Mihai Croitoru MD Electronically signed by Sanda Klein MD Signature Date/Time: 05/01/2020/3:11:36 PM    Final    VAS [place] LOWER EXTREMITY VENOUS (DVT)  Result Date: 05/02/2020  Lower Venous DVT Study Indications: Stroke.  Comparison Study: no prior Performing Technologist:  Abram Sander RVS  Examination Guidelines: A complete evaluation includes B-mode imaging, spectral Doppler, color Doppler, and power Doppler as needed of all accessible portions of each vessel. Bilateral testing is considered an integral part of a complete examination. Limited examinations for reoccurring indications may be performed as noted. The reflux portion of the exam is performed with the patient in reverse Trendelenburg.  +---------+---------------+---------+-----------+----------+-------------------+ RIGHT    CompressibilityPhasicitySpontaneityPropertiesThrombus Aging      +---------+---------------+---------+-----------+----------+-------------------+ CFV      Full           Yes      Yes                                      +---------+---------------+---------+-----------+----------+-------------------+ SFJ      Full                                                             +---------+---------------+---------+-----------+----------+-------------------+ FV Prox  Full                                                             +---------+---------------+---------+-----------+----------+-------------------+ FV Mid   Full                                                             +---------+---------------+---------+-----------+----------+-------------------+  FV DistalFull                                                             +---------+---------------+---------+-----------+----------+-------------------+ PFV      Full                                                             +---------+---------------+---------+-----------+----------+-------------------+ POP      Full           Yes      Yes                                      +---------+---------------+---------+-----------+----------+-------------------+ PTV      Full                                                              +---------+---------------+---------+-----------+----------+-------------------+ PERO                                                  Not well visualized +---------+---------------+---------+-----------+----------+-------------------+   +---------+---------------+---------+-----------+----------+-------------------+ LEFT     CompressibilityPhasicitySpontaneityPropertiesThrombus Aging      +---------+---------------+---------+-----------+----------+-------------------+ CFV      Full           Yes      Yes                                      +---------+---------------+---------+-----------+----------+-------------------+ SFJ      Full                                                             +---------+---------------+---------+-----------+----------+-------------------+ FV Prox  Full                                                             +---------+---------------+---------+-----------+----------+-------------------+ FV Mid   Full                                                             +---------+---------------+---------+-----------+----------+-------------------+ FV DistalFull                                                             +---------+---------------+---------+-----------+----------+-------------------+  PFV      Full                                                             +---------+---------------+---------+-----------+----------+-------------------+ POP      Full           Yes      Yes                                      +---------+---------------+---------+-----------+----------+-------------------+ PTV      Full                                                             +---------+---------------+---------+-----------+----------+-------------------+ PERO                                                  Not well visualized +---------+---------------+---------+-----------+----------+-------------------+      Summary: BILATERAL: - No evidence of deep vein thrombosis seen in the lower extremities, bilaterally. - No evidence of superficial venous thrombosis in the lower extremities, bilaterally. -No evidence of popliteal cyst, bilaterally.   *See table(s) above for measurements and observations. Electronically signed by Jamelle Haring on 05/02/2020 at 4:38:39 PM.    Final        Subjective: No nausea, vomiting or chest pain.  Discharge Exam: Vitals:   05/04/20 1017 05/04/20 1153  BP: (!) 141/66 (!) 158/65  Pulse:  80  Resp:  16  Temp:  98.2 F (36.8 C)  SpO2:  94%   Vitals:   05/04/20 0328 05/04/20 0858 05/04/20 1017 05/04/20 1153  BP: (!) 161/77 (!) 164/66 (!) 141/66 (!) 158/65  Pulse: 76 84  80  Resp: 15 12  16   Temp: 97.9 F (36.6 C) 98.1 F (36.7 C)  98.2 F (36.8 C)  TempSrc: Oral Oral  Oral  SpO2: 100% 100%  94%  Weight:      Height:        General: Pt is alert, awake, not in acute distress Cardiovascular: RRR, S1/S2 +, no rubs, no gallops Respiratory: CTA bilaterally, no wheezing, no rhonchi Abdominal: Soft, NT, ND, bowel sounds + Extremities: no edema, no cyanosis    The results of significant diagnostics from this hospitalization (including imaging, microbiology, ancillary and laboratory) are listed below for reference.     Microbiology: Recent Results (from the past 240 hour(s))  Urine Culture     Status: Abnormal   Collection Time: 04/30/20 11:23 PM   Specimen: Urine, Random  Result Value Ref Range Status   Specimen Description   Final    URINE, RANDOM Performed at [facility] [address] [place], [place] [address]    Special Requests   Final    NONE Performed at [facility], [place] [address], [place], [place] [address]    Culture (A)  Final    50,000 COLONIES/mL GROUP B  STREP(S.AGALACTIAE)ISOLATED TESTING AGAINST S. AGALACTIAE NOT ROUTINELY PERFORMED DUE TO PREDICTABILITY OF AMP/PEN/VAN  SUSCEPTIBILITY. Performed at [facility], [place] [address] [place], [place] [address]    Report Status 05/02/2020 FINAL  Final  Resp Panel by RT-PCR (Flu A&B, Covid) Nasopharyngeal Swab     Status: None   Collection Time: 04/30/20 11:29 PM   Specimen: Nasopharyngeal Swab; Nasopharyngeal(NP) swabs in vial transport medium  Result Value Ref Range Status   SARS Coronavirus 2 by RT PCR NEGATIVE NEGATIVE Final    Comment: (NOTE) SARS-CoV-2 target nucleic acids are NOT DETECTED.  The SARS-CoV-2 RNA is generally detectable in upper respiratory specimens during the acute phase of infection. The lowest concentration of SARS-CoV-2 viral copies this assay can detect is 138 copies/mL. A negative result does not preclude SARS-Cov-2 infection and should not be used as the sole basis for treatment or other patient management decisions. A negative result may occur with  improper specimen collection/handling, submission of specimen other than nasopharyngeal swab, presence of viral mutation(s) within the areas targeted by this assay, and inadequate number of viral copies(<138 copies/mL). A negative result must be combined with clinical observations, patient history, and epidemiological information. The expected result is Negative.  Fact Sheet for Patients:  EntrepreneurPulse.com.au  Fact Sheet for Healthcare Providers:  IncredibleEmployment.be  This test is no t yet approved or cleared by the [place] FDA and  has been authorized for detection and/or diagnosis of SARS-CoV-2 by FDA under an Emergency Use Authorization (EUA). This EUA will remain  in effect (meaning this test can be used) for the duration of the COVID-19 declaration under Section 564(b)(1) of the Act, 21 U.S.C.section 360bbb-3(b)(1), unless the authorization is terminated  or revoked sooner.       Influenza A by PCR NEGATIVE NEGATIVE Final   Influenza B by PCR NEGATIVE NEGATIVE  Final    Comment: (NOTE) The Xpert Xpress SARS-CoV-2/FLU/RSV plus assay is intended as an aid in the diagnosis of influenza from Nasopharyngeal swab specimens and should not be used as a sole basis for treatment. Nasal washings and aspirates are unacceptable for Xpert Xpress SARS-CoV-2/FLU/RSV testing.  Fact Sheet for Patients: EntrepreneurPulse.com.au  Fact Sheet for Healthcare Providers: IncredibleEmployment.be  This test is not yet approved or cleared by the [place] FDA and has been authorized for detection and/or diagnosis of SARS-CoV-2 by FDA under an Emergency Use Authorization (EUA). This EUA will remain in effect (meaning this test can be used) for the duration of the COVID-19 declaration under Section 564(b)(1) of the Act, 21 U.S.C. section 360bbb-3(b)(1), unless the authorization is terminated or revoked.  Performed at [facility], [place] [address] [place], [place] [address]   Surgical pcr screen     Status: None   Collection Time: 05/02/20  4:27 AM   Specimen: Nasal Mucosa; Nasal Swab  Result Value Ref Range Status   MRSA, PCR NEGATIVE NEGATIVE Final   Staphylococcus aureus NEGATIVE NEGATIVE Final    Comment: (NOTE) The Xpert SA Assay (FDA approved for NASAL specimens in patients 28 years of age and older), is one component of a comprehensive surveillance program. It is not intended to diagnose infection nor to guide or monitor treatment. Performed at [facility], [place] [address], [place], [place] [address]      Labs: BNP (last 3 results) No results for input(s): BNP in the last 8760 hours. Basic Metabolic Panel: Recent Labs  Lab 04/30/20 1844 05/01/20 0440 05/02/20 0432 05/03/20 0211  NA  139 138 135 137  K 3.9 3.7 3.4* 3.6  CL 103 105 104 107  CO2 26 26 23  21*  GLUCOSE 111* 91 78 65*  BUN 18 13 15 12   CREATININE 0.65 0.44 0.67 0.58  CALCIUM 8.9 8.9 8.8* 8.4*  MG  --  2.1  --   --     Liver Function Tests: Recent Labs  Lab 04/30/20 1845  AST 36  ALT 30  ALKPHOS 68  BILITOT 0.8  PROT 6.6  ALBUMIN 4.0   No results for input(s): LIPASE, AMYLASE in the last 168 hours. No results for input(s): AMMONIA in the last 168 hours. CBC: Recent Labs  Lab 04/30/20 1844 05/01/20 0440 05/02/20 0432 05/03/20 0211  WBC 6.2 6.1 6.2 9.6  HGB 13.3 12.3 12.9 11.9*  HCT 40.8 37.4 39.0 36.0  MCV 96.7 94.9 94.7 95.5  PLT 199 172 173 180   Cardiac Enzymes: No results for input(s): CKTOTAL, CKMB, CKMBINDEX, TROPONINI in the last 168 hours. BNP: Invalid input(s): POCBNP CBG: Recent Labs  Lab 04/30/20 2027  GLUCAP 91   D-Dimer No results for input(s): DDIMER in the last 72 hours. Hgb A1c No results for input(s): HGBA1C in the last 72 hours. Lipid Profile No results for input(s): CHOL, HDL, LDLCALC, TRIG, CHOLHDL, LDLDIRECT in the last 72 hours. Thyroid function studies No results for input(s): TSH, T4TOTAL, T3FREE, THYROIDAB in the last 72 hours.  Invalid input(s): FREET3 Anemia work up No results for input(s): VITAMINB12, FOLATE, FERRITIN, TIBC, IRON, RETICCTPCT in the last 72 hours. Urinalysis    Component Value Date/Time   COLORURINE STRAW (A) 04/30/2020 2030   APPEARANCEUR CLEAR 04/30/2020 2030   [place] 1.005 04/30/2020 2030   PHURINE 7.0 04/30/2020 2030   GLUCOSEU NEGATIVE 04/30/2020 2030   HGBUR NEGATIVE 04/30/2020 2030   [place] NEGATIVE 04/30/2020 2030   [place] 04/30/2020 2030   PROTEINUR NEGATIVE 04/30/2020 2030   NITRITE NEGATIVE 04/30/2020 2030   [place] (A) 04/30/2020 2030   Sepsis Labs Invalid input(s): PROCALCITONIN,  WBC,  LACTICIDVEN Microbiology Recent Results (from the past 240 hour(s))  Urine Culture     Status: Abnormal   Collection Time: 04/30/20 11:23 PM   Specimen: Urine, Random  Result Value Ref Range Status   Specimen Description   Final    URINE, RANDOM Performed at [facility],  [place] [address], [place], [place] [address]    Special Requests   Final    NONE Performed at [facility], [place] [address] [place] [address] [place], [place] [address]    Culture (A)  Final    50,000 COLONIES/mL GROUP B STREP(S.AGALACTIAE)ISOLATED TESTING AGAINST S. AGALACTIAE NOT ROUTINELY PERFORMED DUE TO PREDICTABILITY OF AMP/PEN/VAN SUSCEPTIBILITY. Performed at [facility], [place] [address], [place], [place] [address]    Report Status 05/02/2020 FINAL  Final  Resp Panel by RT-PCR (Flu A&B, Covid) Nasopharyngeal Swab     Status: None   Collection Time: 04/30/20 11:29 PM   Specimen: Nasopharyngeal Swab; Nasopharyngeal(NP) swabs in vial transport medium  Result Value Ref Range Status   SARS Coronavirus 2 by RT PCR NEGATIVE NEGATIVE Final    Comment: (NOTE) SARS-CoV-2 target nucleic acids are NOT DETECTED.  The SARS-CoV-2 RNA is generally detectable in upper respiratory specimens during the acute phase of infection. The lowest concentration of SARS-CoV-2 viral copies this assay can detect is 138 copies/mL. A negative result does not preclude SARS-Cov-2 infection and should not be used as the sole basis for treatment  or other patient management decisions. A negative result may occur with  improper specimen collection/handling, submission of specimen other than nasopharyngeal swab, presence of viral mutation(s) within the areas targeted by this assay, and inadequate number of viral copies(<138 copies/mL). A negative result must be combined with clinical observations, patient history, and epidemiological information. The expected result is Negative.  Fact Sheet for Patients:  EntrepreneurPulse.com.au  Fact Sheet for Healthcare Providers:  IncredibleEmployment.be  This test is no t yet approved or cleared by the [place] FDA and  has been authorized for detection and/or diagnosis of SARS-CoV-2 by FDA under an Emergency Use  Authorization (EUA). This EUA will remain  in effect (meaning this test can be used) for the duration of the COVID-19 declaration under Section 564(b)(1) of the Act, 21 U.S.C.section 360bbb-3(b)(1), unless the authorization is terminated  or revoked sooner.       Influenza A by PCR NEGATIVE NEGATIVE Final   Influenza B by PCR NEGATIVE NEGATIVE Final    Comment: (NOTE) The Xpert Xpress SARS-CoV-2/FLU/RSV plus assay is intended as an aid in the diagnosis of influenza from Nasopharyngeal swab specimens and should not be used as a sole basis for treatment. Nasal washings and aspirates are unacceptable for Xpert Xpress SARS-CoV-2/FLU/RSV testing.  Fact Sheet for Patients: EntrepreneurPulse.com.au  Fact Sheet for Healthcare Providers: IncredibleEmployment.be  This test is not yet approved or cleared by the [place] FDA and has been authorized for detection and/or diagnosis of SARS-CoV-2 by FDA under an Emergency Use Authorization (EUA). This EUA will remain in effect (meaning this test can be used) for the duration of the COVID-19 declaration under Section 564(b)(1) of the Act, 21 U.S.C. section 360bbb-3(b)(1), unless the authorization is terminated or revoked.  Performed at [facility], [place] [address], [place], [place] [address]   Surgical pcr screen     Status: None   Collection Time: 05/02/20  4:27 AM   Specimen: Nasal Mucosa; Nasal Swab  Result Value Ref Range Status   MRSA, PCR NEGATIVE NEGATIVE Final   Staphylococcus aureus NEGATIVE NEGATIVE Final    Comment: (NOTE) The Xpert SA Assay (FDA approved for NASAL specimens in patients 6 years of age and older), is one component of a comprehensive surveillance program. It is not intended to diagnose infection nor to guide or monitor treatment. Performed at [facility], [place] [address], [place],  [address]      Time coordinating discharge: Over 30  minutes  SIGNED:   Hosie Poisson, MD  [facility] 05/04/2020, 6:25 PM

## 2020-05-04 NOTE — Evaluation (Signed)
Occupational Therapy Evaluation Patient Details Name: Wendy Jackson MRN: [id number] DOB: 14-Jan-1945 Today's Date: 05/04/2020    History of Present Illness Pt is a 75 y/o female presenting to ED for evaluation of new onset L hand numbness and weaknesss, presyncopal sensation and unsteady gait.  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  Post CEA, pt very nauseas and dizzy and continues with new balance deficits.  PMH of lupus and thyroid disease   Clinical Impression   Pt admitted with the above diagnosis and has the deficits listed below. Pt would benefit from acute OT while in hospital to address high level balance deficits that affect safety with adls.  Feel pt may need to be reevaluated by PT as her balance deficits have increased since her CEA after a day of nausea and dizziness.  Will continue to see with goals for independence with all adls.     Follow Up Recommendations  No OT follow up;Supervision/Assistance - 24 hour    Equipment Recommendations  None recommended by OT    Recommendations for Other Services PT consult     Precautions / Restrictions Precautions Precautions: Fall Precaution Comments: Pt feels unsteady on feet and ambulates cautiously Restrictions Weight Bearing Restrictions: No      Mobility Bed Mobility Overal bed mobility: Independent             General bed mobility comments: Pt mobilizes in bed well.    Transfers Overall transfer level: Needs assistance Equipment used: 1 person hand held assist Transfers: Sit to/from [facility] Sit to Stand: Supervision Stand pivot transfers: Supervision       General transfer comment: Pt requires supervision for safety.  Pt slightly unsteady when up.    Balance Overall balance assessment: Mild deficits observed, not formally tested                                          ADL either performed or assessed with clinical judgement   ADL Overall ADL's : Needs assistance/impaired Eating/Feeding: Independent;Sitting   Grooming: Supervision/safety;Standing   Upper Body Bathing: Set up;Sitting   Lower Body Bathing: Supervison/ safety;Sit to/from stand   Upper Body Dressing : Set up;Sitting   Lower Body Dressing: Supervision/safety;Sit to/from stand   Toilet Transfer: Min guard;Ambulation Toilet Transfer Details (indicate cue type and reason): pt walked to bathroom, sink and back with hand held assist. Toileting- Clothing Manipulation and Hygiene: Supervision/safety;Sitting/lateral lean       Functional mobility during ADLs: Min guard General ADL Comments: Pt does well with all adls in sitting.  Pt c/o mild dizziness when standing therefore requires supervision when on her feet for safety.     Vision Baseline Vision/History: No visual deficits Patient Visual Report: No change from baseline Vision Assessment?: No apparent visual deficits     Perception Perception Perception Tested?: Yes   Praxis Praxis Praxis tested?: Within functional limits    Pertinent Vitals/Pain Pain Assessment: Faces Faces Pain Scale: Hurts a little bit Pain Location: headache Pain Descriptors / Indicators: Aching Pain Intervention(s): Patient requesting pain meds-RN notified     Hand Dominance Right   Extremity/Trunk Assessment Upper Extremity Assessment Upper Extremity Assessment: Overall WFL for tasks assessed   Lower Extremity Assessment Lower Extremity Assessment: Defer to PT evaluation   Cervical / Trunk Assessment Cervical /  Trunk Assessment: Normal   Communication Communication Communication: No difficulties   Cognition Arousal/Alertness: Awake/alert Behavior During Therapy: WFL for tasks assessed/performed Overall Cognitive Status: Within Functional Limits for tasks assessed                                     General  Comments  Pt is slightly unsteady on her feet when up since surgery. Pt was totally independent before CEA but do feel there are new balance deficits since her CEA.    Exercises     Shoulder Instructions      Home Living Family/patient expects to be discharged to:: Private residence Living Arrangements: Spouse/significant other Available Help at Discharge: Family;Available 24 hours/day Type of Home: House Home Access: Stairs to enter [employer] of Steps: 2 Entrance Stairs-Rails: None Home Layout: One level     Bathroom Shower/Tub: Occupational psychologist: Standard     Home Equipment: None;Shower seat - built in          Prior Functioning/Environment Level of Independence: Independent        Comments: drives        OT Problem List: Impaired balance (sitting and/or standing);Pain      OT Treatment/Interventions: Self-care/ADL training;Balance training    OT Goals(Current goals can be found in the care plan section) Acute Rehab OT Goals Patient Stated Goal: to go home OT Goal Formulation: With patient Time For Goal Achievement: 05/18/20 Potential to Achieve Goals: Good ADL Goals Additional ADL Goal #1: Pt will walk to bathroom and toilet with mod I Additional ADL Goal #2: Pt will bathe in shower with seat available with mod I. Additional ADL Goal #3: Pt will gather clothes and dress self with mod I  OT Frequency: Min 2X/week   Barriers to D/C:            Co-evaluation              AM-PAC OT "6 Clicks" Daily Activity     Outcome Measure Help from another person eating meals?: None Help from another person taking care of personal grooming?: None Help from another person toileting, which includes using toliet, bedpan, or urinal?: A Little Help from another person bathing (including washing, rinsing, drying)?: A Little Help from another person to put on and taking off regular upper body clothing?: None Help from another person to  put on and taking off regular lower body clothing?: A Little 6 Click Score: 21   End of Session Nurse Communication: Mobility status;Patient requests pain meds  Activity Tolerance: Patient tolerated treatment well Patient left: in chair;with call bell/phone within reach  OT Visit Diagnosis: Unsteadiness on feet (R26.81)                Time: 7564-3329 OT Time Calculation (min): 35 min Charges:  OT General Charges $OT Visit: 1 Visit OT Evaluation $OT Eval Moderate Complexity: 1 Mod OT Treatments $Self Care/Home Management : 8-22 mins   Glenford Peers 05/04/2020, 8:58 AM

## 2020-05-07 ENCOUNTER — Other Ambulatory Visit: Payer: Self-pay | Admitting: *Deleted

## 2020-05-07 NOTE — Patient Outreach (Signed)
[facility]) Care Management  05/07/2020  Braley Luckenbaugh August 01, 1945 [id number]  Telephone outreach for [employer] on [place] Stroke Call: Unsure of follow up appt. Educated pt on [place] Management services. Advised of assigned nurses name who will be following up if necessary Thursday and after. Until then this nurse is available for questions.  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 She had a R carotid endarterectomy on 05/02/20. Discharged home on Friday.   Medical Hx includes: hypothyroidism, Lupus, allergic rhinitis, HTN, hyperlipidemia, glaucoma  She denies any troublesome after effects of her CVA. She does say her L foot feels swollen, her shoe feels tight. Her energy level has dropped off quite a bit. She is used to being very active.  Current Meds  Medication Sig  . amLODipine (NORVASC) 5 MG tablet Take 1 tablet (5 mg total) by mouth daily.  Francia Greaves THYROID 30 MG tablet Take 1 tablet (30 mg total) by mouth daily.  [place] Kitchen aspirin 81 MG chewable tablet Chew 1 tablet (81 mg total) by mouth daily.  [place] Kitchen atorvastatin (LIPITOR) 10 MG tablet Take 4 tablets (40 mg total) by mouth daily.  . cetirizine (ZYRTEC) 10 MG chewable tablet Chew 10 mg by mouth daily.  . clopidogrel (PLAVIX) 75 MG tablet Take 1 tablet (75 mg total) by mouth daily.  . hydroxychloroquine (PLAQUENIL) 200 MG tablet Take 200 mg by mouth daily.  [place] Kitchen LUMIGAN 0.01 % SOLN Place 1 drop into both eyes at bedtime.  [place] Kitchen SIMBRINZA 1-0.2 % SUSP Place 1 drop into both eyes in the morning, at noon, and at bedtime.  Mar[place] Kitchen Ubiquinol 100 MG CAPS Take 100 mg by mouth.   Pt does have an appt with vascular on May 16th. She is to call neurology for an appt in 4 weeks. She has been instructed today to call her primary care provider and schedule an appt for this week or early next week for follow up.  She will be receiving a heart monitor in the mail with instructions which she will wear for 30 days.  She  was appreciative if the call.  Will send successful letter.  Eulah Pont. Myrtie Neither, MSN, [facility] Gerontological Nurse Practitioner [facility] Care Management [phone]

## 2020-05-08 ENCOUNTER — Telehealth: Payer: Self-pay

## 2020-05-08 NOTE — Telephone Encounter (Signed)
Transition Care Management Follow-up Telephone Call  Date of discharge and from where: 05/04/2020-[facility]  How have you been since you were released from the hospital? Doing ok but no ebergy. Feeling better each day.  Any questions or concerns? No  Items Reviewed:  Did the pt receive and understand the discharge instructions provided? Yes   Medications obtained and verified? Yes   Other? Yes   Any new allergies since your discharge? No   Dietary orders reviewed? Yes  Do you have support at home? Yes   Home Care and Equipment/Supplies: Were home health services ordered? no If so, what is the name of the agency? n/a  Has the agency set up a time to come to the patient's home? not applicable Were any new equipment or medical supplies ordered?  No What is the name of the medical supply agency? n/a Were you able to get the supplies/equipment? not applicable Do you have any questions related to the use of the equipment or supplies?n/a  Functional Questionnaire: (I = Independent and D = Dependent) ADLs: I  Bathing/Dressing- I  Meal Prep- I  Eating- I  Maintaining continence- I  Transferring/Ambulation- I  Managing Meds- I  Follow up appointments reviewed:   PCP Hospital f/u appt confirmed? Yes  Scheduled to see Dr. Bryan Lemma on 05/16/20 @ 11:00.  [facility] f/u appt confirmed? Yes  Scheduled to see vascular surgery on 05/26/20 @ 1:30.  Are transportation arrangements needed? No   If their condition worsens, is the pt aware to call PCP or go to the Emergency Dept.? Yes  Was the patient provided with contact information for the PCP's office or ED? Yes  Was to pt encouraged to call back with questions or concerns? Yes

## 2020-05-11 ENCOUNTER — Other Ambulatory Visit: Payer: Self-pay | Admitting: *Deleted

## 2020-05-12 ENCOUNTER — Ambulatory Visit (INDEPENDENT_AMBULATORY_CARE_PROVIDER_SITE_OTHER): Payer: Medicare Other

## 2020-05-12 DIAGNOSIS — I63231 Cerebral infarction due to unspecified occlusion or stenosis of right carotid arteries: Secondary | ICD-10-CM | POA: Diagnosis not present

## 2020-05-12 DIAGNOSIS — I639 Cerebral infarction, unspecified: Secondary | ICD-10-CM

## 2020-05-12 DIAGNOSIS — I4891 Unspecified atrial fibrillation: Secondary | ICD-10-CM | POA: Diagnosis not present

## 2020-05-15 ENCOUNTER — Other Ambulatory Visit: Payer: Self-pay

## 2020-05-15 ENCOUNTER — Other Ambulatory Visit: Payer: Self-pay | Admitting: *Deleted

## 2020-05-15 NOTE — Patient Outreach (Addendum)
Wendy Jackson) Care Management  05/15/2020  Wendy Jackson [birth year] [id number]   [facility] outreach for EMMI-stroke Discharged from [facility] Day #   9       Date:  Monday 05/14/20 10 am  Red Alert Reason: Lost interest in things? Yes Sad, hopeless, anxious or empty? yes and RED ON EMMI ALERT Day #   6       Date:  Friday 05/11/20 10 am  Red Alert Reason: Questions/problems with meds? Yes  AND RED ON EMMI ALERT Day #   1       Date:  Friday 05/06/20 10 am  Red Alert Reason: Scheduled a follow up appointment? I don't know Problems setting up rehab? Didn't need any Insurance: blue cross and blue shield medicare  [facility] admissions x 1  ED visits x 1 in the last 6 months  [facility] 04/30/20 to 05/04/20 dx small acute infarct involving the right precentral post central secondary to right ICA stenosis with high risk plaque  On 05/07/20 in the absence of this [facility] RN CM,  Mrs Hechler was outreached by Liliana Cline [facility] NP to address the 05/06/20 EMMI alert Outreach successful  Patient is able to verify HIPAA identifiers [place] Management RN reviewed and addressed red alert with patient  Consent: [facility] RN CM reviewed [facility] services with patient. Patient gave verbal consent for services [facility] telephonic RN CM.   Advised patient that there will be further automated EMMI- post discharge calls to assess how the patient is doing following the recent hospitalization Advised the patient that another call may be received from a nurse if any of their responses were abnormal. Patient voiced understanding and was appreciative of f/u call.    EMMI:  Automate system not receiving her answers well  She denies concerns with medications during this call but did inquire how long to take Plavix [facility] RN CM reviewed with her the shared [facility] neurology staff use of Plavix until the [facility] fill bottle is complete then remain on Aspirin 81 mg until labs indicate otherwise during  neurology visit She voiced understanding She also will speak with her primary care provider (PCP) during an office visit on Wednesday 05/16/20 She reports she had been anxious about being discharged but reports this anxiety is now resolved She reports being anxious related to using bathroom/elimination before hospitalization she reports having had lot of energy now not as much She reports being in a "fog" Discuss Plavix,ASA & cholesterol medications Only has reported residual Right ear numbness under chin & side of right side of her face She reports it is better but still feels numb   Encouragement provided  Offered [facility] SW but she denied need for coping resources from [facility] SW/care guide   Past Medical History:  Diagnosis Date  . Lupus ([place])   . Thyroid disease        Plan: Patient agrees to the care plan and follow up within the next 7-10 business days Pt encouraged to return a call to [place] CM prn  Kate Sweetman L. Lavina Hamman, RN, BSN, [place] Coordinator Office number [phone] Mobile number [phone]  Main [facility] number [phone] Fax number [phone]

## 2020-05-16 ENCOUNTER — Encounter: Payer: Self-pay | Admitting: Family Medicine

## 2020-05-16 ENCOUNTER — Ambulatory Visit (INDEPENDENT_AMBULATORY_CARE_PROVIDER_SITE_OTHER): Payer: Medicare Other | Admitting: Family Medicine

## 2020-05-16 VITALS — HR 79 | Temp 98.2°F | Ht 64.0 in | Wt 84.8 lb

## 2020-05-16 DIAGNOSIS — Z09 Encounter for follow-up examination after completed treatment for conditions other than malignant neoplasm: Secondary | ICD-10-CM

## 2020-05-16 DIAGNOSIS — I10 Essential (primary) hypertension: Secondary | ICD-10-CM

## 2020-05-16 DIAGNOSIS — Z8673 Personal history of transient ischemic attack (TIA), and cerebral infarction without residual deficits: Secondary | ICD-10-CM

## 2020-05-16 DIAGNOSIS — I63231 Cerebral infarction due to unspecified occlusion or stenosis of right carotid arteries: Secondary | ICD-10-CM

## 2020-05-16 MED ORDER — AMLODIPINE BESYLATE 10 MG PO TABS: 10.0000 mg | ORAL_TABLET | Freq: Every day | ORAL | 1 refills | Status: DC

## 2020-05-16 NOTE — Progress Notes (Signed)
Wendy Jackson is a 75 y.o. female  Chief Complaint  Patient presents with  . Hospitalization Follow-up    Acute ischemic stroke.  Pt does want to discuss some concerns from hospital, one would be the blood thinner medication,  And will she continue to take an aspirin everyday.  Pt concerned about new medications causing brain fog.  When and if she can get 2nd booster for covid.    HPI: Wendy Jackson is a 75 y.o. female patient seen today for hospital follow-up. She is accompanied by her husband. She was admitted on 04/30/20 and discharged home on 05/04/20. Pt dx with acute ischemia stroke. She presented to ER with Lt sided numbness and weakness, gait abnormality. She was found to have a small acute infarct involving the Rt precentral and postcentral gyri secondary to Rt ICA stenosis. She had Rt carotid endarterectomy on 05/02/20. She was on started on aspirin and plavix x 3 weeks followed by aspirin daily.  It was recommended she have a 30 day event monitor which she is currently wearing.  Pt was started on norvasc 5mg  daily for HTN.  She has f/u with vasc surg on 05/28/20 and neuro ([facility]) on 06/06/20.  Hospital admission notes, labs, studies, and d/c summary reviewed today by me.   Home BPs 120's-low 140's/70-80.BP this AM 139/78.  Past Medical History:  Diagnosis Date  . Lupus ([place])   . Thyroid disease     Past Surgical History:  Procedure Laterality Date  . ENDARTERECTOMY Right 05/02/2020   Procedure: RIGHT CAROTID ENDARTERECTOMY;  Surgeon: Rosetta Posner, MD;  Location: [facility];  Service: Vascular;  Laterality: Right;  . EYE SURGERY      Social History   Socioeconomic History  . Marital status: Married    Spouse name: Not on file  . Number of children: Not on file  . Years of education: Not on file  . Highest education level: Not on file  Occupational History  . Not on file  Tobacco Use  . Smoking status: Never Smoker  . Smokeless tobacco: Never Used  Vaping Use  . Vaping Use:  Never used  Substance and Sexual Activity  . Alcohol use: Never  . Drug use: Never  . Sexual activity: Not on file  Other Topics Concern  . Not on file  Social History Narrative  . Not on file   Social Determinants of Health   Financial Resource Strain: Low Risk   . Difficulty of Paying Living Expenses: Not hard at all  Food Insecurity: No Food Insecurity  . Worried About Charity fundraiser in the Last Year: Never true  . Ran Out of Food in the Last Year: Never true  Transportation Needs: No Transportation Needs  . Lack of Transportation (Medical): No  . Lack of Transportation (Non-Medical): No  Physical Activity: Insufficiently Active  . Days of Exercise per Week: 4 days  . Minutes of Exercise per Session: 10 min  Stress: No Stress Concern Present  . Feeling of Stress : Only a little  Social Connections: Moderately Integrated  . Frequency of Communication with Friends and Family: More than three times a week  . Frequency of Social Gatherings with Friends and Family: Once a week  . Attends Religious Services: 1 to 4 times per year  . Active Member of Clubs or Organizations: No  . Attends Archivist Meetings: Never  . Marital Status: Married  Human resources officer Violence: Not At Risk  . Fear of Current  or Ex-Partner: No  . Emotionally Abused: No  . Physically Abused: No  . Sexually Abused: No    Family History  Problem Relation Age of Onset  . ALS Mother   . Dementia Father      Immunization History  Administered Date(s) Administered  . PFIZER(Purple Top)SARS-COV-2 Vaccination 02/20/2019, 03/16/2019, 11/11/2019  . Tdap 08/04/2019    Outpatient Encounter Medications as of 05/16/2020  Medication Sig  . amLODipine (NORVASC) 5 MG tablet Take 1 tablet (5 mg total) by mouth daily.  Francia Greaves THYROID 30 MG tablet Take 1 tablet (30 mg total) by mouth daily.  Marland Kitchen aspirin 81 MG chewable tablet Chew 1 tablet (81 mg total) by mouth daily.  Marland Kitchen atorvastatin (LIPITOR) 40 MG  tablet Take 1 tablet by mouth daily.  . clopidogrel (PLAVIX) 75 MG tablet Take 1 tablet (75 mg total) by mouth daily.  . hydroxychloroquine (PLAQUENIL) 200 MG tablet Take 200 mg by mouth daily.  Marland Kitchen LUMIGAN 0.01 % SOLN Place 1 drop into both eyes at bedtime.  . Misc Natural Products (CURCUMAX PRO PO) Take 250 mg by mouth daily.  . Probiotic Product (UP4 PROBIOTICS WOMENS PO) Take 1 capsule by mouth daily.  Marland Kitchen SIMBRINZA 1-0.2 % SUSP Place 1 drop into both eyes in the morning, at noon, and at bedtime.  Marland Kitchen atorvastatin (LIPITOR) 10 MG tablet Take 4 tablets (40 mg total) by mouth daily.  . cetirizine (ZYRTEC) 10 MG chewable tablet Chew 10 mg by mouth daily. (Patient not taking: Reported on 05/16/2020)  . Cholecalciferol (VITAMIN D3) 1.25 MG (50000 UT) TABS Take 5,000 Units by mouth daily. (Patient not taking: No sig reported)  . HAWTHORNE PO Take 1 tablet by mouth daily. (Patient not taking: No sig reported)  . levOCARNitine L-Tartrate (L-CARNITINE) 500 MG CAPS Take 500 mg by mouth daily. (Patient not taking: No sig reported)  . Magnesium Citrate 100 MG TABS Take 150 mg by mouth. Pt taking 1.5 dose (Patient not taking: No sig reported)  . Misc Natural Products (AIRBORNE ELDERBERRY) CHEW Chew 1 tablet by mouth in the morning and at bedtime. (Patient not taking: No sig reported)  . Resveratrol 50 MG CAPS Take 50 mg by mouth daily. (Patient not taking: No sig reported)  . Ubiquinol 100 MG CAPS Take 100 mg by mouth. (Patient not taking: Reported on 05/16/2020)   No facility-administered encounter medications on file as of 05/16/2020.     ROS: Pertinent positives and negatives noted in HPI. Remainder of ROS non-contributory    Allergies  Allergen Reactions  . Augmentin [Amoxicillin-Pot Clavulanate] Other (See Comments)    unk  . Tramadol     Nausea   . Flexeril [Cyclobenzaprine] Palpitations    Pulse 79   Temp 98.2 F (36.8 C) (Oral)   Ht 5\' 4"  (1.626 m)   Wt 84 lb 12.8 oz (38.5 kg)   SpO2 99%    BMI 14.56 kg/m   Wt Readings from Last 3 Encounters:  05/16/20 84 lb 12.8 oz (38.5 kg)  05/02/20 88 lb 10 oz (40.2 kg)  03/06/20 88 lb (39.9 kg)   Temp Readings from Last 3 Encounters:  05/16/20 98.2 F (36.8 C) (Oral)  05/04/20 98.2 F (36.8 C) (Oral)  08/04/19 (!) 97.2 F (36.2 C) (Temporal)   BP Readings from Last 3 Encounters:  05/04/20 (!) 158/65  08/04/19 (!) 149/86   Pulse Readings from Last 3 Encounters:  05/16/20 79  05/04/20 80  08/04/19 72     Physical Exam  Constitutional:      General: She is not in acute distress.    Appearance: Normal appearance. She is not ill-appearing.  Neck:   Cardiovascular:     Rate and Rhythm: Normal rate and regular rhythm.     Pulses: Normal pulses.  Musculoskeletal:     Right lower leg: No edema.     Left lower leg: No edema.  Neurological:     Mental Status: She is alert and oriented to person, place, and time.  Psychiatric:        Mood and Affect: Mood normal.        Behavior: Behavior normal.      A/P:  1. Hospital discharge follow-up 2. 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 Primary hypertension - home readings high 130's-low 140's/70-80's Increase: - amLODipine (NORVASC) 10 MG tablet; Take 1 tablet (10 mg total) by mouth daily.  Dispense: 90 tablet; Refill: 1 - increased from 5mg  to 10mg  daily - cont to check BP at home and call or send mychart message for BP consistently > 140 / > 90    This visit occurred during the SARS-CoV-2 public health emergency.  Safety protocols were in place, including screening questions prior to the visit, additional usage of staff PPE, and extensive cleaning of exam room while observing appropriate contact time as indicated for disinfecting solutions.

## 2020-05-17 ENCOUNTER — Ambulatory Visit: Payer: Medicare Other | Admitting: *Deleted

## 2020-05-23 ENCOUNTER — Other Ambulatory Visit: Payer: Self-pay | Admitting: *Deleted

## 2020-05-23 NOTE — Patient Outreach (Signed)
[facility]) Care Management  05/23/2020  Wendy Jackson 20-Mar-1945 [id number]  [facility] Unsuccessful outreach  Mrs [name] was referred on 05/07/20 to [facility] for EMMI stroke red on EMMI alerts Day 13 Friday 05/18/20 1701  Questions/problems with medicines? Yes and Day # 9 Date:  Monday 05/14/20 10 am  Red Alert Reason: Lost interest in things? Yes Sad, hopeless, anxious or empty? yes and RED ON EMMI ALERT Day # 6 Date:  Friday 05/11/20 10 am  Red Alert Reason: Questions/problems with meds? Yes and RED ON EMMI ALERT Day #1 Date:  Friday 05/06/20 10 am  Red Alert Reasons: Scheduled a follow up appointment? I don't know Problems setting up rehab? Didn't need any Insurance: blue cross and blue shield medicare  [facility] admissions x 1  ED visits x 1 in the last 6 months  [facility]/20 to 05/04/20 dx small acute infarct involving the right precentral post central secondary to right ICA stenosis with high risk plaque   Last successful outreach on 05/15/20 and it is noted pt was seen by her primary care provider (PCP) on 05/16/20   Outreach attempt to the home number  No answer. [facility] RN CM left HIPAA [facility] Portability and Accountability Act) compliant voicemail message along with CM's contact info.   durable medical equipment (DME) BP cuff  Plan: [facility] RN CM scheduled this patient for another call attempt within 4-7 business days  Honi Name L. Lavina Hamman, RN, BSN, [place] Coordinator Office number [phone] Mobile number [phone]  Main [facility] number [phone] Fax number [phone]

## 2020-05-28 ENCOUNTER — Ambulatory Visit (INDEPENDENT_AMBULATORY_CARE_PROVIDER_SITE_OTHER): Payer: Medicare Other | Admitting: Physician Assistant

## 2020-05-28 ENCOUNTER — Other Ambulatory Visit: Payer: Self-pay

## 2020-05-28 ENCOUNTER — Encounter: Payer: Self-pay | Admitting: Physician Assistant

## 2020-05-28 VITALS — BP 180/76 | HR 84 | Temp 98.0°F | Resp 20 | Ht 64.0 in | Wt 86.2 lb

## 2020-05-28 DIAGNOSIS — I63231 Cerebral infarction due to unspecified occlusion or stenosis of right carotid arteries: Secondary | ICD-10-CM

## 2020-05-28 MED ORDER — ATORVASTATIN CALCIUM 40 MG PO TABS: 40.0000 mg | ORAL_TABLET | Freq: Every day | ORAL | 9 refills | Status: DC

## 2020-05-28 NOTE — Progress Notes (Signed)
Carotid Artery Follow-Up   VASCULAR SURGERY ASSESSMENT & PLAN:   Wendy Jackson is a 75 y.o. female presents for post-op follow-up. Her husband accompanies her today.   She is status post right CEA for symptomatic stenosis on 05/02/2020 by Dr. Donnetta Hutching. [name] revealed small acute infarct involving the right precentral and postcentral gyri.  She had approximately 50% stenosis on CTA of the neck of the proximal right ICA and no hemodynamically significant stenosis of the left carotid system. Her left hand numbness resolved. She completed 3 weeks of Plavix.  We reviewed the signs and symptoms of stroke/TIA and advised the patient to call EMS should these occur.   Continue optimal medical management of hypertension and follow-up with primary care physician. She does not use tobacco products and is not diabetic. Continue the following medications: statin and aspirin. Atorvastatin 40 mg e-prescribed to [facility] Follow-up in 9 months with carotid duplex ultrasound.  SUBJECTIVE:   The patient denies monocular blindness, slurred speech, facial drooping, extremity weakness or numbness. She has some residual numbness below her right ear which is improving. She states she has white coat syndrome and brings in her BP readings from home.  PHYSICAL EXAM:    Vitals:   05/28/20 1340  Weight: 86 lb 3.2 oz (39.1 kg)  Height: 5\' 4"  (1.626 m)   She brings multiple BP readings from home. Systolic BP range is 086V.  General appearance: Well-developed, well-nourished in no apparent distress Neurologic: Alert and oriented x4, tongue is midline, face symmetric, speech fluent, 5 out of 5 bilateral upper extremity grip strength, triceps and biceps strength Incision: right neck healing nicely. No ecchymosis or hematoma Cardiovascular: Heart rate and rhythm are regular.  No carotid bruits. Respirations: Nonlabored   NON-INVASIVE VASCULAR STUDIES   None today  PROBLEM LIST:    The patient's past medical  history, past surgical history, family history, social history, allergy list and medication list are reviewed.   CURRENT MEDS:    Current Outpatient Medications:  .  amLODipine (NORVASC) 10 MG tablet, Take 1 tablet (10 mg total) by mouth daily., Disp: 90 tablet, Rfl: 1 .  ARMOUR THYROID 30 MG tablet, Take 1 tablet (30 mg total) by mouth daily., Disp: 90 tablet, Rfl: 3 .  aspirin 81 MG chewable tablet, Chew 1 tablet (81 mg total) by mouth daily., Disp: 30 tablet, Rfl: 3 .  atorvastatin (LIPITOR) 40 MG tablet, Take 1 tablet by mouth daily., Disp: , Rfl:  .  clopidogrel (PLAVIX) 75 MG tablet, Take 1 tablet (75 mg total) by mouth daily., Disp: 21 tablet, Rfl: 0 .  HAWTHORNE PO, Take 1 tablet by mouth daily., Disp: , Rfl:  .  hydroxychloroquine (PLAQUENIL) 200 MG tablet, Take 200 mg by mouth daily., Disp: , Rfl:  .  levOCARNitine L-Tartrate (L-CARNITINE) 500 MG CAPS, Take 500 mg by mouth daily., Disp: , Rfl:  .  LUMIGAN 0.01 % SOLN, Place 1 drop into both eyes at bedtime., Disp: , Rfl:  .  Magnesium Citrate 100 MG TABS, Take 150 mg by mouth. Pt taking 1.5 dose, Disp: , Rfl:  .  Misc Natural Products (AIRBORNE ELDERBERRY) CHEW, Chew 1 tablet by mouth in the morning and at bedtime., Disp: , Rfl:  .  Misc Natural Products (CURCUMAX PRO PO), Take 250 mg by mouth daily., Disp: , Rfl:  .  Probiotic Product (UP4 PROBIOTICS WOMENS PO), Take 1 capsule by mouth daily., Disp: , Rfl:  .  Resveratrol 50 MG CAPS, Take 50 mg by  mouth daily., Disp: , Rfl:  .  SIMBRINZA 1-0.2 % SUSP, Place 1 drop into both eyes in the morning, at noon, and at bedtime., Disp: , Rfl:  .  Ubiquinol 100 MG CAPS, Take 100 mg by mouth., Disp: , Rfl:    REVIEW OF SYSTEMS:   [X]  denotes positive finding, [ ]  denotes negative finding Cardiac  Comments:  Chest pain or chest pressure:    Shortness of breath upon exertion:    Short of breath when lying flat:    Irregular heart rhythm:        Vascular    Pain in calf, thigh, or hip  brought on by ambulation:    Pain in feet at night that wakes you up from your sleep:     Blood clot in your veins:    Leg swelling:         Pulmonary    Oxygen at home:    Productive cough:     Wheezing:         Neurologic    Sudden weakness in arms or legs:     Sudden numbness in arms or legs:     Sudden onset of difficulty speaking or slurred speech:    Temporary loss of vision in one eye:     Problems with dizziness:         Gastrointestinal    Blood in stool:     Vomited blood:         Genitourinary    Burning when urinating:     Blood in urine:        Psychiatric    Major depression:         Hematologic    Bleeding problems:    Problems with blood clotting too easily:        Skin    Rashes or ulcers:        Constitutional    Fever or chills:     Barbie Banner, PA-C  Office: [phone] 05/28/2020

## 2020-05-29 ENCOUNTER — Encounter: Payer: Self-pay | Admitting: *Deleted

## 2020-05-29 ENCOUNTER — Other Ambulatory Visit: Payer: Self-pay | Admitting: *Deleted

## 2020-05-29 NOTE — Patient Outreach (Signed)
[facility]) Care Management  05/29/2020  Wendy Jackson 1945-03-23 [id number]  [facility] outreach follow up to [facility] stroke referred patient   Wendy Jackson was referred on 05/07/20 to [facility] for EMMI stroke red on EMMI alerts Day 13 Friday 05/18/20 1701  Questions/problems with medicines? Yes and Day # 9Date: Monday 05/14/20 10 am Red Alert Reason: Lost interest in things? Yes Sad, hopeless, anxious or empty? yes and RED ON EMMI ALERT Day #6Date: Friday 05/11/20 10 am Red Alert Reason: Questions/problems with meds? Yes and RED ON EMMI ALERT Day #1Date: Friday 05/06/20 10 am Red Alert Reasons: Scheduled a follow up appointment? I don't know Problems setting up rehab? Didn't need any  On [employer] Insurance:blue cross and blue shieldmedicare  [facility] x1ED visits x 1in the last 6 months  [facility]20 to 05/04/20 dx small acute infarct involving the right precentral post central secondary to right ICA stenosis with high risk plaque   Patient is able to verify HIPAA ([place]) identifiers Reviewed and addressed the purpose of the follow up call with the patient  Consent: [facility] RN CM reviewed [facility] services with patient. Patient gave verbal consent for services.   Assessment EMMI red alert for 05/18/20 questions/problems with medicines-  Pt reports she saw the  vascular vein surgeon on 05/28/20 who re reviewed her medications with her after her pcp had also on 05/16/20  Pcp had informed her BP needed further managing and had request an increase of Norvasc to 10 mg to assist with a home care systolic blood pressure (SBP) goal lower than 130  Reviewed guidelines/standards for BP home management related to 130/80 & 140/90 values Discussed the importance of preventing the heart from working harder (elevated BP) She voiced understanding She confirms home monitoring of her BP with a  reported average last week of 139/68 She and [facility] RN CM reviewed hypotension and hypertension with encouragement for her to keep her pcp updated on any hypotensive and hypertensive episodes for over 1-2 days to allow pcp to adjust medications as needed. She voices understanding now of why her pcp increased her medication and why her specialist referred her back to her pcp.      Wendy Jackson reports she is "Getting more energy" She reports a baseline before her admission per her husband as " an energizer bunny"  [place] RN CM reviewed details of her CVA and encouraged CVA recovery to include clustering of tasks to conserve energy She denies bleeding of any orifices and was encouraged to report any to MD    Wendy Jackson reports her appetite is good and she is trying to gain weight. She reports at baseline prior to her CVA, she had never been overweight. She recalls as a teenager her providers encouraging her to take in "milkshakes" She reports she is attempting to add more protein, meats and nuts in her diet Wt 84 lbs with BMI of 14.55 on 05/16/20 had been 88 lbs  [facility] RN CM discussed the importance of good nutrition to assist with healing and CVA recovery Nutritional risk score 11 ( 11 or less= at risk)  EMMI materials sent via e-mail on high calorie, high protein diet, hypotension/dealing with low blood pressure from the drugs you take, generalized weakness  Patient Active Problem List   Diagnosis Date Noted  . CVA (cerebral vascular accident) ([place]) 05/02/2020  . Acquired hypothyroidism 05/01/2020  . Lupus ([place])   . Numbness of  left hand   . Allergic rhinitis   . Acute ischemic stroke ([place]) 04/30/2020      Plans Patient agrees to care plan and follow up within the next 14-21 business days Pt encouraged to return a call to [facility] RN CM prn Goals Addressed              This Visit's Progress     Patient Stated   .  [facility]) Eat Healthy (pt-stated)   On track     Timeframe:  Short-Term Goal Priority:   Medium Start Date:                    05/29/20         Expected End Date:      08/11/20                 Follow Up Date 06/13/20   - set a realistic goal Increase protein, meat, nuts to diet     Notes:  05/29/20 attempting to gain weight Reports adding meats, nuts, protein to diet     .  [facility]) Manage Fatigue (Stroke) (pt-stated)   On track     Timeframe:  Short-Term Goal Priority:  Medium Start Date:                       05/29/20      Expected End Date:          07/12/20            Follow Up Date 06/13/20    - balance periods of rest with activity as needed - eat healthy   .     Notes:     .  [facility]) Track and Manage My Blood Pressure-Hypertension (pt-stated)   On track     Timeframe:  Short-Term Goal Priority:  Medium Start Date:                    05/29/20         Expected End Date:        07/12/20               Follow Up Date 06/13/20   - write blood pressure results in a log or diary Check BP as ordered by PCP    Notes:  05/29/20 checking BP at home and recording it to share with pcp average BP last week 139/68    .  Manage My Medicine (pt-stated)   On track     Timeframe:  Short-Term Goal Priority:  Medium Start Date:         05/29/20                    Expected End Date:   07/12/20                    Follow Up Date 06/13/20    - keep a list of all the medicines I take; vitamins and herbals too Monitor use of Norvasc to prevent hypotension Updated PCP of abnormal BP values indicating hypo and hypertensive episodes     Notes:  05/25/20 monitoring BP at home report average last week of 139/68 Voice understanding of hypo and hypertension values to report to [employer] L. Lavina Hamman, RN, BSN, [place] Coordinator Office number [phone] Main [facility] number [phone] Fax number [phone]

## 2020-05-30 ENCOUNTER — Other Ambulatory Visit: Payer: Self-pay | Admitting: *Deleted

## 2020-05-30 DIAGNOSIS — I63231 Cerebral infarction due to unspecified occlusion or stenosis of right carotid arteries: Secondary | ICD-10-CM

## 2020-06-04 ENCOUNTER — Other Ambulatory Visit: Payer: Self-pay | Admitting: *Deleted

## 2020-06-04 ENCOUNTER — Telehealth: Payer: Self-pay | Admitting: Family Medicine

## 2020-06-04 DIAGNOSIS — I639 Cerebral infarction, unspecified: Secondary | ICD-10-CM

## 2020-06-04 DIAGNOSIS — I10 Essential (primary) hypertension: Secondary | ICD-10-CM

## 2020-06-04 MED ORDER — LOSARTAN POTASSIUM 25 MG PO TABS
25.0000 mg | ORAL_TABLET | Freq: Every day | ORAL | 1 refills | Status: DC
Start: 2020-06-04 — End: 2020-08-01

## 2020-06-04 NOTE — Telephone Encounter (Signed)
Advise to take medication at bedtime, wear compression stocking during the day, and Minimize sodium in meals. If no improvement in 1week, call the office.

## 2020-06-04 NOTE — Telephone Encounter (Signed)
Pt informed of Nche's suggestions.  Pt wanted me to still send message to Dr. Loletha Grayer for her awareness.

## 2020-06-04 NOTE — Patient Outreach (Signed)
[facility]) Care Management  06/04/2020  Fredricka Kohrs [birth year] [id number]   [facility] care coordination- Nurse call center    [facility] received an e-mail from [place] call center Erenest Blank) and [facility] CMA after patient outreach about lower extremity swelling that is not reducing during the day after Norvasc was increased to 10 mg  E-mail to Letta Median to provide an update Noted pt with outreach to pcp office as recommended by Nurse call center also   Sent further EMMI education on swelling, lupus and hypothyroidism  Plans follow up within the next 7-14 business days  Aalijah Lanphere L. Lavina Hamman, RN, BSN, [place] Coordinator Office number [phone] Main [facility] number [phone] Fax number [phone]

## 2020-06-04 NOTE — Telephone Encounter (Signed)
Patient states that her Amlodipine was increased at last visit, but now she's having swelling in ankles and feet. She said that she researched the medication and swelling is a side effect. Please call her at [phone] and advise.

## 2020-06-04 NOTE — Telephone Encounter (Signed)
I spoke with pt and she informed me that she started taking Amlodipine 5mg  during hospital stay on 04/30/2020.  She noticed swelling in both feet before she left, which was on and off while she had been home.  Pt had a f/u visit w/Dr. C on 05/16/20 and medication increased to 10mg .  Pt said that her feet and ankles have been swelling and staying swollen since starting the higher dose, which she did not start until last Wednesday, 05/30/20.  I told pt that I would send mess to Doc of the day and call her back.  Please advise

## 2020-06-04 NOTE — Telephone Encounter (Signed)
Pt should stop the amlodipine, as that could be causing her persistent feet swelling. I will send a new BP med - losartan - to her pharmacy

## 2020-06-05 NOTE — Telephone Encounter (Signed)
Pt informed of Dr. Lurline Del message below.

## 2020-06-06 ENCOUNTER — Encounter: Payer: Self-pay | Admitting: Adult Health

## 2020-06-06 ENCOUNTER — Ambulatory Visit: Payer: Medicare Other | Admitting: Adult Health

## 2020-06-06 VITALS — BP 163/73 | HR 75 | Ht 64.0 in | Wt 89.0 lb

## 2020-06-06 DIAGNOSIS — I10 Essential (primary) hypertension: Secondary | ICD-10-CM | POA: Diagnosis not present

## 2020-06-06 DIAGNOSIS — Z9889 Other specified postprocedural states: Secondary | ICD-10-CM | POA: Diagnosis not present

## 2020-06-06 DIAGNOSIS — E785 Hyperlipidemia, unspecified: Secondary | ICD-10-CM

## 2020-06-06 DIAGNOSIS — I63231 Cerebral infarction due to unspecified occlusion or stenosis of right carotid arteries: Secondary | ICD-10-CM

## 2020-06-06 NOTE — Progress Notes (Signed)
[facility] [address] [place]. [place] [address] [phone]       [place] Batoul Limes Date of Birth:  [birth year] Medical Record Number:  [id number]   Reason for Referral:  hospital stroke follow up    SUBJECTIVE:   CHIEF COMPLAINT:  Chief Complaint  Patient presents with  . Follow-up    RM 77 with husband (Wendy Jackson) Pt is well and stable,     HPI:   Wendy Jackson an 75 y.o.female with PMH of lupus and thyroid diseasewho presented on 04/30/2020 to [facility] ED for evaluation of new onset left hand numbness and weakness with presyncopal sensation and unsteady gait. The patient first noticed the symptoms about 2-3 days ago, followed by resolution. The symptoms returned the day of admission, so she went to the ED for evaluation.  Personally reviewed hospitalization pertinent progress notes, lab work and imaging with summary provided.  Evaluated by Dr. 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  Recommended 30-day cardiac event monitor outpatient to rule out A. fib.  Recommended DAPT for 3 weeks and aspirin alone as well as starting atorvastatin 40 mg daily.  LDL 93.  Questionable lupus on Plaquenil due to positive blood test in the past with subsequent blood test negative - per note, patient reported rheumatologist does not believe she has lupus and their plan was to taper off Plaquenil.  Hypercoagulable work-up negative for resulted labs with some pending at discharge.  Evaluated by therapies and discharged home in stable condition without therapy needs.  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, emboli pattern, most likely due to right ICA stenosis with high risk plaque   MR brain small acute infarct involving the right precentral and postcentral gyri.  No  hemorrhage or mass-effect.  Finding of chronic small vessel ischemia  CTA head/neck approximately 50% stenosis of the proximal right ICA secondary to mixed density atherosclerosis  LE venous Doppler neg  Hypercoag labs are negative   Recommend 30 day cardiac event monitoring as outpt to rule out afib-requested from cards master  LDL 93  A1c 5.5  No antithrombotic or anticoagulant prior to admission  Now on DAPT w/ASA 81mg  and Plavix 75 mg daily x 3 weeks then ASA 81mg  alone  Therapy recommendations: Cleared without needs   Disposition:  Home    Today, 06/06/2020, Wendy Jackson is being seen for hospital follow-up accompanied by her husband, Wendy Jackson  She has been doing well since discharge without new or reoccurring stroke/TIA symptoms.  She was initially feeling increased fatigue, brain fog and increased anxiety but this is all improved and currently feels at baseline. She has completed 3 weeks DAPT and remains on aspirin alone without associated side effects Remains on atorvastatin 40 mg daily without associated side effects Blood pressure today 163/73 but reports typically elevated at provider appointments -routinely monitors at home with SBP typically 130s.  Per pt, change of antihypertensives yesterday from amlodipine to losartan due to lower extremity swelling  Cardiac monitor currently wearing til Saturday  She has since had follow-up with vascular surgery and PCP She does still feel some numbness and pain at incision site but this has been gradually improving  She plans on receiving her second COVID-19 booster on Friday    ROS:   14 system review of systems performed and  negative with exception of no complaints  PMH:  Past Medical History:  Diagnosis Date  . Lupus ([place])   . Thyroid disease     PSH:  Past Surgical History:  Procedure Laterality Date  . ENDARTERECTOMY Right 05/02/2020   Procedure: RIGHT CAROTID ENDARTERECTOMY;  Surgeon: Rosetta Posner, MD;  Location: [place];  Service: Vascular;  Laterality: Right;  . EYE SURGERY      Social History:  Social History   Socioeconomic History  . Marital status: Married    Spouse name: Wendy Jackson  . Number of children: Not on file  . Years of education: Not on file  . Highest education level: Not on file  Occupational History  . Occupation: retired  Tobacco Use  . Smoking status: Never Smoker  . Smokeless tobacco: Never Used  Vaping Use  . Vaping Use: Never used  Substance and Sexual Activity  . Alcohol use: Never  . Drug use: Never  . Sexual activity: Not on file  Other Topics Concern  . Not on file  Social History Narrative  . Not on file   Social Determinants of Health   Financial Resource Strain: Low Risk   . Difficulty of Paying Living Expenses: Not hard at all  Food Insecurity: No Food Insecurity  . Worried About Charity fundraiser in the Last Year: Never true  . Ran Out of Food in the Last Year: Never true  Transportation Needs: No Transportation Needs  . Lack of Transportation (Medical): No  . Lack of Transportation (Non-Medical): No  Physical Activity: Insufficiently Active  . Days of Exercise per Week: 4 days  . Minutes of Exercise per Session: 10 min  Stress: No Stress Concern Present  . Feeling of Stress : Only a little  Social Connections: Moderately Integrated  . Frequency of Communication with Friends and Family: More than three times a week  . Frequency of Social Gatherings with Friends and Family: More than three times a week  . Attends Religious Services: More than 4 times per year  . Active Member of Clubs or Organizations: No  . Attends Archivist Meetings: Never  . Marital Status: Married  Human resources officer Violence: Not At Risk  . Fear of Current or Ex-Partner: No  . Emotionally Abused: No  . Physically Abused: No  . Sexually Abused: No    Family History:  Family History  Problem Relation Age of Onset  . ALS Mother   . Dementia Father      Medications:   Current Outpatient Medications on File Prior to Visit  Medication Sig Dispense Refill  . ARMOUR THYROID 30 MG tablet Take 1 tablet (30 mg total) by mouth daily. 90 tablet 3  . aspirin 81 MG chewable tablet Chew 1 tablet (81 mg total) by mouth daily. 30 tablet 3  . atorvastatin (LIPITOR) 40 MG tablet Take 1 tablet (40 mg total) by mouth daily. 30 tablet 9  . HAWTHORNE PO Take 1 tablet by mouth daily.    . hydroxychloroquine (PLAQUENIL) 200 MG tablet Take 200 mg by mouth daily.    Marland Kitchen levOCARNitine L-Tartrate (L-CARNITINE) 500 MG CAPS Take 500 mg by mouth daily.    Marland Kitchen losartan (COZAAR) 25 MG tablet Take 1 tablet (25 mg total) by mouth daily. 90 tablet 1  . LUMIGAN 0.01 % SOLN Place 1 drop into both eyes at bedtime.    . Magnesium Citrate 100 MG TABS Take 150 mg by mouth. Pt taking 1.5 dose    .  Misc Natural Products (AIRBORNE ELDERBERRY) CHEW Chew 1 tablet by mouth in the morning and at bedtime.    . Misc Natural Products (CURCUMAX PRO PO) Take 250 mg by mouth daily.    . Probiotic Product (UP4 PROBIOTICS WOMENS PO) Take 1 capsule by mouth daily.    Marland Kitchen Resveratrol 50 MG CAPS Take 50 mg by mouth daily.    Marland Kitchen SIMBRINZA 1-0.2 % SUSP Place 1 drop into both eyes in the morning, at noon, and at bedtime.    Marland Kitchen Ubiquinol 100 MG CAPS Take 100 mg by mouth.     No current facility-administered medications on file prior to visit.    Allergies:   Allergies  Allergen Reactions  . Augmentin [Amoxicillin-Pot Clavulanate] Other (See Comments)    unk  . Tramadol     Nausea   . Flexeril [Cyclobenzaprine] Palpitations      OBJECTIVE:  Physical Exam  Vitals:   06/06/20 0908  Weight: 89 lb (40.4 kg)  Height: 5\' 4"  (1.626 m)   Body mass index is 15.28 kg/m. No exam data present   Post stroke PHQ 2/9 Depression screen PHQ 2/9 05/29/2020  Decreased Interest 0  Down, Depressed, Hopeless 0  PHQ - 2 Score 0     General: well developed, well nourished,  pleasant elderly  Caucasian female, seated, in no evident distress Head: head normocephalic and atraumatic.   Neck: supple with no carotid or supraclavicular bruits Cardiovascular: regular rate and rhythm, no murmurs Musculoskeletal: BLE pitting edema greater distally Skin:  R CEA surgical incision healing well Vascular:  Normal pulses all extremities   Neurologic Exam Mental Status: Awake and fully alert.   Fluent speech and language.  Oriented to place and time. Recent and remote memory intact. Attention span, concentration and fund of knowledge appropriate. Mood and affect appropriate.  Cranial Nerves: Fundoscopic exam reveals sharp disc margins. Pupils equal, briskly reactive to light. Extraocular movements full without nystagmus. Visual fields full to confrontation. Hearing intact. Facial sensation intact. Face, tongue, palate moves normally and symmetrically.  Motor: Normal bulk and tone. Normal strength in all tested extremity muscles Sensory.: intact to touch , pinprick , position and vibratory sensation.  Coordination: Rapid alternating movements normal in all extremities. Finger-to-nose and heel-to-shin performed accurately bilaterally. Gait and Station: Arises from chair without difficulty. Stance is normal. Gait demonstrates normal stride length and balance without use of assistive device. Tandem walk and heel toe without difficulty.  Reflexes: 1+ and symmetric. Toes downgoing.     NIHSS  0 Modified Rankin  0    ASSESSMENT: Wendy Jackson is a 75 y.o. year old female with small acute infarct involving the right precentral postcentral gyri, embolic pattern most likely due to right ICA stenosis with high risk plaque on 4/18 s/p R CEA.  Vascular risk factors include right carotid stenosis, HTN, HLD, advanced age and questionable lupus diagnosis     PLAN:  1. R precentral postcentral gyri infarct:  a. Residual deficit: Recovered well without residual deficit.  b. Continue aspirin 81 mg daily  and  atorvastatin 40 mg daily for secondary stroke prevention.   c. Hypercoagulable labs negative.   d. Cardiac monitor will be completed Saturday e. Discussed secondary stroke prevention measures and importance of close PCP follow up for aggressive stroke risk factor management  2. Right ICA stenosis: s/p CEA 4/20. followed by [facility] - plans to repeat carotid duplex 9 months postprocedure 3. HTN: BP goal <130/90.  Stable at home on losartan per PCP.  Intolerant to amlodipine due to swelling 4. HLD: LDL goal <70. Recent LDL 93 - started on atorvastatin 40 mg daily.  Request follow-up with PCP in the next 1 to 2 months for repeat lipid panel and ongoing prescribing of atorvastatin 5. ?lupus: reported hx of lupus positive lab work - recent lab work negative.  Remains on Plaquenil followed by rheumatology -advised routine follow-up and further discussion with ongoing use    Follow up in 4 months or call earlier if needed   CC:  [place] provider: Dr. Leonie Man PCP: Ronnald Nian, DO    I spent 52 minutes of face-to-face and non-face-to-face time with patient.  This included previsit chart review including extensive review of recent hospitalization pertinent progress notes, lab work and imaging, lab review still pending at discharge, study review, electronic health record documentation, and extensive patient education and discussion regarding recent stroke and etiology, secondary stroke prevention measures and importance of managing stroke risk factors as well as medication compliance and answered all other questions to patient satisfaction  Frann Rider, AGNP-BC  [facility] [address] [place], [place] [address]  Phone [phone] Fax [phone] Note: This document was prepared with digital dictation and possible smart phrase technology. Any transcriptional errors that result from this process are unintentional.

## 2020-06-06 NOTE — Patient Instructions (Addendum)
Ensure follow up with vascular surgery 9 months after your procedure for repeat carotid ultrasound - they should call you to schedule   Continue aspirin 81 mg daily  and atorvastatin  for secondary stroke prevention  Would recommend that you repeat lab work to recheck your cholesterol levels around July - this can be done with your PCP, any [facility] office or in this office    Continue to follow up with PCP regarding cholesterol and blood pressure management  Maintain strict control of hypertension with blood pressure goal below 130/90 and cholesterol with LDL cholesterol (bad cholesterol) goal below 70 mg/dL.      Followup in the future with me in 4 months or call earlier if needed      Thank you for coming to see [place] at [facility]. I hope we have been able to provide you high quality care today.  You may receive a patient satisfaction survey over the next few weeks. We would appreciate your feedback and comments so that we may continue to improve ourselves and the health of our patients.   Stroke Prevention Some medical conditions and behaviors are associated with a higher chance of having a stroke. You can help prevent a stroke by making nutrition, lifestyle, and other changes, including managing any medical conditions you may have. What nutrition changes can be made?  Eat healthy foods. You can do this by: ? Choosing foods high in fiber, such as fresh fruits and vegetables and whole grains. ? Eating at least 5 or more servings of fruits and vegetables a day. Try to fill half of your plate at each meal with fruits and vegetables. ? Choosing lean protein foods, such as lean cuts of meat, poultry without skin, fish, tofu, beans, and nuts. ? Eating low-fat dairy products. ? Avoiding foods that are high in salt (sodium). This can help lower blood pressure. ? Avoiding foods that have saturated fat, trans fat, and cholesterol. This can help prevent high  cholesterol. ? Avoiding processed and premade foods.  Follow your health care provider's specific guidelines for losing weight, controlling high blood pressure (hypertension), lowering high cholesterol, and managing diabetes. These may include: ? Reducing your daily calorie intake. ? Limiting your daily sodium intake to 1,500 milligrams (mg). ? Using only healthy fats for cooking, such as olive oil, canola oil, or sunflower oil. ? Counting your daily carbohydrate intake.   What lifestyle changes can be made?  Maintain a healthy weight. Talk to your health care provider about your ideal weight.  Get at least 30 minutes of moderate physical activity at least 5 days a week. Moderate activity includes brisk walking, biking, and swimming.  Do not use any products that contain nicotine or tobacco, such as cigarettes and e-cigarettes. If you need help quitting, ask your health care provider. It may also be helpful to avoid exposure to secondhand smoke.  Limit alcohol intake to no more than 1 drink a day for nonpregnant women and 2 drinks a day for men. One drink equals 12 oz of beer, 5 oz of wine, or 1 oz of hard liquor.  Stop any illegal drug use.  Avoid taking birth control pills. Talk to your health care provider about the risks of taking birth control pills if: ? You are over 8 years old. ? You smoke. ? You get migraines. ? You have ever had a blood clot. What other changes can be made?  Manage your cholesterol levels. ? Eating a healthy diet is  important for preventing high cholesterol. If cholesterol cannot be managed through diet alone, you may also need to take medicines. ? Take any prescribed medicines to control your cholesterol as told by your health care provider.  Manage your diabetes. ? Eating a healthy diet and exercising regularly are important parts of managing your blood sugar. If your blood sugar cannot be managed through diet and exercise, you may need to take  medicines. ? Take any prescribed medicines to control your diabetes as told by your health care provider.  Control your hypertension. ? To reduce your risk of stroke, try to keep your blood pressure below 130/80. ? Eating a healthy diet and exercising regularly are an important part of controlling your blood pressure. If your blood pressure cannot be managed through diet and exercise, you may need to take medicines. ? Take any prescribed medicines to control hypertension as told by your health care provider. ? Ask your health care provider if you should monitor your blood pressure at home. ? Have your blood pressure checked every year, even if your blood pressure is normal. Blood pressure increases with age and some medical conditions.  Get evaluated for sleep disorders (sleep apnea). Talk to your health care provider about getting a sleep evaluation if you snore a lot or have excessive sleepiness.  Take over-the-counter and prescription medicines only as told by your health care provider. Aspirin or blood thinners (antiplatelets or anticoagulants) may be recommended to reduce your risk of forming blood clots that can lead to stroke.  Make sure that any other medical conditions you have, such as atrial fibrillation or atherosclerosis, are managed. What are the warning signs of a stroke? The warning signs of a stroke can be easily remembered as BEFAST.  B is for balance. Signs include: ? Dizziness. ? Loss of balance or coordination. ? Sudden trouble walking.  E is for eyes. Signs include: ? A sudden change in vision. ? Trouble seeing.  F is for face. Signs include: ? Sudden weakness or numbness of the face. ? The face or eyelid drooping to one side.  A is for arms. Signs include: ? Sudden weakness or numbness of the arm, usually on one side of the body.  S is for speech. Signs include: ? Trouble speaking (aphasia). ? Trouble understanding.  T is for time. ? These symptoms may  represent a serious problem that is an emergency. Do not wait to see if the symptoms will go away. Get medical help right away. Call your local emergency services (911 in the [place] Do not drive yourself to the hospital.  Other signs of stroke may include: ? A sudden, severe headache with no known cause. ? Nausea or vomiting. ? Seizure. Where to find more information For more information, visit:  American Stroke Association: www.strokeassociation.org  National Stroke Association: www.stroke.org Summary  You can prevent a stroke by eating healthy, exercising, not smoking, limiting alcohol intake, and managing any medical conditions you may have.  Do not use any products that contain nicotine or tobacco, such as cigarettes and e-cigarettes. If you need help quitting, ask your health care provider. It may also be helpful to avoid exposure to secondhand smoke.  Remember BEFAST for warning signs of stroke. Get help right away if you or a loved one has any of these signs. This information is not intended to replace advice given to you by your health care provider. Make sure you discuss any questions you have with your health care provider. Document  Revised: 12/12/2016 Document Reviewed: 02/05/2016 Elsevier Patient Education  2021 [employer].

## 2020-06-06 NOTE — Progress Notes (Signed)
I agree with the above plan 

## 2020-06-13 ENCOUNTER — Other Ambulatory Visit: Payer: Self-pay

## 2020-06-13 ENCOUNTER — Other Ambulatory Visit: Payer: Self-pay | Admitting: *Deleted

## 2020-06-13 ENCOUNTER — Other Ambulatory Visit: Payer: Self-pay | Admitting: Physician Assistant

## 2020-06-13 DIAGNOSIS — I4891 Unspecified atrial fibrillation: Secondary | ICD-10-CM

## 2020-06-13 DIAGNOSIS — I63231 Cerebral infarction due to unspecified occlusion or stenosis of right carotid arteries: Secondary | ICD-10-CM

## 2020-06-13 DIAGNOSIS — I639 Cerebral infarction, unspecified: Secondary | ICD-10-CM

## 2020-06-13 NOTE — Patient Outreach (Signed)
[facility]) Care Management  06/13/2020  Avonda Toso 1945/09/23 [id number]   [facility] outreach follow up to [facility] stroke referred patient   Mrs Majesti Gambrell was referred on 05/07/20 to [facility] for EMMI stroke red on EMMI alerts x 4 for questions/problems with medicines, lost of interest in things, symptoms of depression, scheduled follow up appointment concerns and problems setting up rehab These concerns were address during previous outreach Mrs Shaneeka Scarboro also outreach to the [place] nurse call center related to lower extremity swelling that was not reducing during the day after Norvasc was increased to 10 mg   Insurance:blue cross and blue shieldmedicare  [facility] admissions x1ED visits x 1in the last 6 months  [facility] 04/30/20 to 05/04/20 dx small acute infarct involving the right precentral post central secondary to right ICA stenosis with high risk plaque   Patient is able to verify HIPAA ([place]) identifiers Reviewed and addressed the purpose of the follow up call with the patient  Consent: [facility]) RN CM reviewed [place] with patient. Patient gave verbal consent for services.   Follow up Assessment Reviewed patient outreach to [place] nurse call center with the follow up interventions by [facility] RN CM to her primary care provider (PCP)   Mrs Cassara reports "I feel almost normal. I have more energy" She reports she is been able to increase her walking and is now returned to the use of her elliptical.  Edema Compression hose that were recommended helped to resolve the swelling per patient The amlodipine/Norvasc was discontinued as she determined the medicine could be causing her persistent feet swelling after consulting her MD She did receive [facility] EMMI education and was started on Losartan  She denies side effects from Losartan at this time  She has complied with taking her medication,  wearing compression stocking during the day, and minimizing sodium in meals  Monitor site  She recently removed the rest of the Tegaderm dressing used to secure her monitor when it was used She states she had some itching initially but now the site has signs of healing   Hypertension (HTN)  BP <130/90 and she has More energy   Weight/nutrition Recent weight at 89 lb, she has gained 5 lbs since the last outreach She is trying to eat more meats but generally prefers vegetables and organic foods  She reports she did not eat sweets when she was younger  Lupus  She reports she was diagnosed in 2010 by a now retired provider She reports she was retested approximately in 2016-2017 by another provider who informed her test results for lupus was negative. This provider in 2017 recommended a gluten free diet that had been used with a subject group for 1-2 months by an [place]. She states after starting the gluten free meal plan, her symptoms resolved. She now questions if she actually had a form of arthritis vs lupus. She states she was informed she should remain on the Lupus treatment medicine She denies need of care coordination or disease management services further for lupus and stroke She did receive EMMI education via e-mail  Patient Active Problem List   Diagnosis Date Noted  . CVA (cerebral vascular accident) ([place]) 05/02/2020  . Acquired hypothyroidism 05/01/2020  . Lupus ([place])   . Numbness of left hand   . Allergic rhinitis   . Acute ischemic stroke ([place]) 04/30/2020   [facility] Progression  [facility] services reviewed with patient No listed disease  management diagnosis and patient preference is to have [facility] case closure with the option of outreaching to [facility] RN CM prn   Plans Patient requests no follow up at this time/case closure as no further identified needs Pt encouraged to return a call to [place] [facility] 24 hour nurse call center prn Case closure letters to patient and pcp   Goals  Addressed              This Visit's Progress     Patient Stated   .  COMPLETED: ([facility]) Eat Healthy (pt-stated)   On track     Timeframe:  Short-Term Goal Priority:  Medium Start Date:                    05/29/20         Expected End Date:      08/11/20                 Case closure Date 06/13/20  Barriers: Knowledge   - set a realistic goal Increase protein, meat, nuts to diet     Notes:  06/13/20 Goal met- Recent weight at 89 lb, she has gained 5 lbs since the last outreach She is trying to eat more meats but generally prefers vegetables and organic foods  She reports she did not eat sweets when she was younger Continues a gluten free meal plan 05/29/20 attempting to gain weight Reports adding meats, nuts, protein to diet     .  COMPLETED: ([facility]N) Manage Fatigue (Stroke) (pt-stated)   On track     Timeframe:  Short-Term Goal Priority:  Medium Start Date:                       05/29/20      Expected End Date:          07/12/20            Goal met Date 06/13/20   Barriers: Knowledge   - balance periods of rest with activity as needed - eat healthy     Notes:  06/13/20 Goal met Patient reports since change from Norvasc to losartan, decreased of lower extremity edema and use of compression hose, her energy has improved   "I feel almost normal. I have more energy" She reports she is been able to increase her walking and is now returned to the use of her elliptical.    .  COMPLETED: (THN) Track and Manage My Blood Pressure-Hypertension (pt-stated)   On track     Timeframe:  Short-Term Goal Priority:  Medium Start Date:                    05/29/20         Expected End Date:        07/12/20               Goal completion Date 06/13/20  Barriers: Knowledge   - write blood pressure results in a log or diary Check BP as ordered by PCP     Notes:  06/13/20 goal met She reports BP remains within normal limits, continues home monitoring  Changed from Norvasc to losartan 05/29/20 checking  BP at home and recording it to share with pcp average BP last week 139/68    .  COMPLETED: Manage My Medicine (pt-stated)   On track     Timeframe:  Short-Term Goal Priority:  Medium Start Date:  05/29/20                    Expected End Date:   07/12/20                    Goals completed Date 06/13/20  Barriers: Knowledge    - keep a list of all the medicines I take; vitamins and herbals too Monitor use of Norvasc to prevent hypotension Updated PCP of abnormal BP values indicating hypo and hypertensive episodes   Notes:  06/13/20 goal met - Has all her medications The amlodipine/Norvasc was discontinued as she determined the medicine could be causing her persistent feet swelling after consulting her MD She did receive [facility] EMMI education and was started on Losartan  She denies side effects from Losartan at this time  05/25/20 monitoring BP at home report average last week of 139/68 Voice understanding of hypo and hypertension values to report to [employer] L. Lavina Hamman, RN, BSN, [place] Coordinator Office number [phone] Main [facility] number [phone] Fax number [phone]

## 2020-06-18 NOTE — Progress Notes (Signed)
Kindly inform the patient that heart monitoring study did not reveal evidence of atrial fibrillation or any worrisome arrhythmia

## 2020-06-26 DIAGNOSIS — H401133 Primary open-angle glaucoma, bilateral, severe stage: Secondary | ICD-10-CM | POA: Diagnosis not present

## 2020-06-26 DIAGNOSIS — Z79899 Other long term (current) drug therapy: Secondary | ICD-10-CM | POA: Diagnosis not present

## 2020-07-10 DIAGNOSIS — H524 Presbyopia: Secondary | ICD-10-CM | POA: Diagnosis not present

## 2020-07-26 ENCOUNTER — Encounter: Payer: Self-pay | Admitting: Family Medicine

## 2020-07-26 DIAGNOSIS — E039 Hypothyroidism, unspecified: Secondary | ICD-10-CM

## 2020-08-01 MED ORDER — LOSARTAN POTASSIUM 25 MG PO TABS: 25.0000 mg | ORAL_TABLET | Freq: Every day | ORAL | 1 refills | Status: DC

## 2020-08-01 MED ORDER — ARMOUR THYROID 30 MG PO TABS: 30.0000 mg | ORAL_TABLET | Freq: Every day | ORAL | 1 refills | Status: DC

## 2020-08-03 ENCOUNTER — Encounter: Payer: Medicare Other | Admitting: Family Medicine

## 2020-08-20 ENCOUNTER — Telehealth: Payer: Self-pay | Admitting: *Deleted

## 2020-08-20 NOTE — Telephone Encounter (Signed)
Pt called concerned that she is having side effects from atorvastatin after a manufacturer change. She has been taking it for 90 days with no effects. After changing medication manufacturers she has started getting red dots on her face. Advised pt to contact her pharmacy to see if this could be caused by a manufacturer change in her atorvastatin. Pt verbalized understanding.

## 2020-09-13 DIAGNOSIS — M8589 Other specified disorders of bone density and structure, multiple sites: Secondary | ICD-10-CM | POA: Diagnosis not present

## 2020-09-13 DIAGNOSIS — R768 Other specified abnormal immunological findings in serum: Secondary | ICD-10-CM | POA: Diagnosis not present

## 2020-09-13 DIAGNOSIS — M353 Polymyalgia rheumatica: Secondary | ICD-10-CM | POA: Diagnosis not present

## 2020-10-23 ENCOUNTER — Ambulatory Visit: Payer: Medicare Other | Admitting: Adult Health

## 2020-10-23 ENCOUNTER — Encounter: Payer: Self-pay | Admitting: Adult Health

## 2020-10-23 VITALS — BP 203/90 | HR 89 | Ht 64.0 in | Wt 87.0 lb

## 2020-10-23 DIAGNOSIS — I63231 Cerebral infarction due to unspecified occlusion or stenosis of right carotid arteries: Secondary | ICD-10-CM

## 2020-10-23 DIAGNOSIS — I10 Essential (primary) hypertension: Secondary | ICD-10-CM

## 2020-10-23 DIAGNOSIS — Z9889 Other specified postprocedural states: Secondary | ICD-10-CM

## 2020-10-23 DIAGNOSIS — E785 Hyperlipidemia, unspecified: Secondary | ICD-10-CM

## 2020-10-23 MED ORDER — ROSUVASTATIN CALCIUM 40 MG PO TABS: 40.0000 mg | ORAL_TABLET | Freq: Every day | ORAL | 5 refills | Status: DC

## 2020-10-23 MED ORDER — ASPIRIN EC 81 MG PO TBEC: 81.0000 mg | DELAYED_RELEASE_TABLET | Freq: Every day | ORAL | 11 refills | Status: AC

## 2020-10-23 NOTE — Patient Instructions (Signed)
Restart aspirin 81 mg daily  and start Crestor 40 mg daily  for secondary stroke prevention  Continue to follow up with PCP regarding cholesterol and blood pressure management  Maintain strict control of hypertension with blood pressure goal below 130/90 and cholesterol with LDL cholesterol (bad cholesterol) goal below 70 mg/dL.       Followup in the future with me in 6 months or call earlier if needed       Thank you for coming to see [place] at [facility]. I hope we have been able to provide you high quality care today.  You may receive a patient satisfaction survey over the next few weeks. We would appreciate your feedback and comments so that we may continue to improve ourselves and the health of our patients.

## 2020-10-23 NOTE — Progress Notes (Signed)
[facility] [address] [place]. [place] [address] [phone]       HOSPITAL FOLLOW UP NOTE  [name] Date of Birth:  1945/04/26 Medical Record Number:  [id number]   Reason for Referral:  hospital stroke follow up    SUBJECTIVE:   CHIEF COMPLAINT:  Chief Complaint  Patient presents with   Follow-up    RM 3 alone Pt is well and stable    HISTORY:  Wendy Jackson is an 75 y.o. female with PMH of lupus and thyroid disease who presented on 04/30/2020 to [facility] ED for evaluation of new onset left hand numbness and weakness with presyncopal sensation and unsteady gait. The patient first noticed the symptoms about 2-3 days ago, followed by resolution. The symptoms returned the day of admission, so she went to the ED for evaluation.  Personally reviewed hospitalization pertinent progress notes, lab work and imaging with summary provided.  Evaluated by Dr. 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  Recommended 30-day cardiac event monitor outpatient to rule out A. fib.  Recommended DAPT for 3 weeks and aspirin alone as well as starting atorvastatin 40 mg daily.  LDL 93.  Questionable lupus on Plaquenil due to positive blood test in the past with subsequent blood test negative - per note, patient reported rheumatologist does not believe she has lupus and their plan was to taper off Plaquenil.  Hypercoagulable work-up negative for resulted labs with some pending at discharge.  Evaluated by therapies and discharged home in stable condition without therapy needs.  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, emboli pattern, most likely due to right ICA stenosis with high risk plaque   MR brain small acute infarct involving the right precentral and postcentral gyri.  No hemorrhage or  mass-effect.  Finding of chronic small vessel ischemia CTA head/neck approximately 50% stenosis of the proximal right ICA secondary to mixed density atherosclerosis LE venous Doppler neg Hypercoag labs are negative  Recommend 30 day cardiac event monitoring as outpt to rule out afib-requested from cards master LDL 93 A1c 5.5 No antithrombotic or anticoagulant prior to admission  Now on DAPT w/ASA 81mg  and Plavix 75 mg daily x 3 weeks then ASA 81mg  alone Therapy recommendations: Cleared without needs  Disposition:  Home    Initial visit 06/06/2020 JM: [name] is being seen for hospital follow-up accompanied by her husband, Wendy Jackson  She has been doing well since discharge without new or reoccurring stroke/TIA symptoms.  She was initially feeling increased fatigue, brain fog and increased anxiety but this is all improved and currently feels at baseline. She has completed 3 weeks DAPT and remains on aspirin alone without associated side effects Remains on atorvastatin 40 mg daily without associated side effects Blood pressure today 163/73 but reports typically elevated at provider appointments -routinely monitors at home with SBP typically 130s.  Per pt, change of antihypertensives yesterday from amlodipine to losartan due to lower extremity swelling  Cardiac monitor currently wearing til Saturday  She has since had follow-up with vascular surgery and PCP She does still feel some numbness and pain at incision site but this has been gradually improving  She plans on receiving her second COVID-19 booster on Friday    HPI:  Update 10/23/2020 JM: Returns for 66-month stroke follow-up unaccompanied.  Overall doing well.  Denies new  or reoccurring stroke/TIA symptoms.  Self discontinued aspirin due to concern of possible bleeding risk .  Self d/c'd atrovastatin due to skin reaction which stopped after taking. Blood pressure today elevated (typically elevated at appointments) - stable at home  typically 130s/70s.  Cardiac monitor negative for atrial fibrillation.  No new concerns at this time.    ROS:   14 system review of systems performed and negative with exception of no complaints  PMH:  Past Medical History:  Diagnosis Date   Lupus ([place])    Thyroid disease     PSH:  Past Surgical History:  Procedure Laterality Date   ENDARTERECTOMY Right 05/02/2020   Procedure: RIGHT CAROTID ENDARTERECTOMY;  Surgeon: Rosetta Posner, MD;  Location: [facility] OR;  Service: Vascular;  Laterality: Right;   EYE SURGERY      Social History:  Social History   Socioeconomic History   Marital status: Married    Spouse name: Wendy Jackson   Number of children: Not on file   Years of education: Not on file   Highest education level: Not on file  Occupational History   Occupation: retired  Tobacco Use   Smoking status: Never   Smokeless tobacco: Never  Vaping Use   Vaping Use: Never used  Substance and Sexual Activity   Alcohol use: Never   Drug use: Never   Sexual activity: Not on file  Other Topics Concern   Not on file  Social History Narrative   Not on file   Social Determinants of Health   Financial Resource Strain: Low Risk    Difficulty of Paying Living Expenses: Not hard at all  Food Insecurity: No Food Insecurity   Worried About Charity fundraiser in the Last Year: Never true   [place] in the Last Year: Never true  Transportation Needs: No Transportation Needs   Lack of Transportation (Medical): No   Lack of Transportation (Non-Medical): No  Physical Activity: Insufficiently Active   Days of Exercise per Week: 4 days   Minutes of Exercise per Session: 10 min  Stress: No Stress Concern Present   Feeling of Stress : Only a little  Social Connections: Moderately Integrated   Frequency of Communication with Friends and Family: More than three times a week   Frequency of Social Gatherings with Friends and Family: More than three times a week   Attends Religious  Services: More than 4 times per year   Active Member of [employer] or Organizations: No   Attends Music therapist: Never   Marital Status: Married  Human resources officer Violence: Not At Risk   Fear of Current or Ex-Partner: No   Emotionally Abused: No   Physically Abused: No   Sexually Abused: No    Family History:  Family History  Problem Relation Age of Onset   ALS Mother    Dementia Father     Medications:   Current Outpatient Medications on File Prior to Visit  Medication Sig Dispense Refill   ARMOUR THYROID 30 MG tablet Take 1 tablet (30 mg total) by mouth daily. 90 tablet 1   cetirizine (ZYRTEC) 10 MG chewable tablet Chew 10 mg by mouth daily.     Cholecalciferol (VITAMIN D-3) 125 MCG (5000 UT) TABS Take by mouth daily.     HAWTHORNE PO Take 1 tablet by mouth daily.     hydroxychloroquine (PLAQUENIL) 200 MG tablet Take 200 mg by mouth daily.     levOCARNitine L-Tartrate (L-CARNITINE) 500 MG CAPS  Take 500 mg by mouth daily.     losartan (COZAAR) 25 MG tablet Take 1 tablet (25 mg total) by mouth daily. 90 tablet 1   LUMIGAN 0.01 % SOLN Place 1 drop into both eyes at bedtime.     Misc Natural Products (AIRBORNE ELDERBERRY) CHEW Chew 1 tablet by mouth in the morning and at bedtime.     Probiotic Product (UP4 PROBIOTICS WOMENS PO) Take 1 capsule by mouth daily.     Resveratrol 50 MG CAPS Take 50 mg by mouth daily.     SIMBRINZA 1-0.2 % SUSP Place 1 drop into the left eye in the morning, at noon, and at bedtime.     Ubiquinol 100 MG CAPS Take 100 mg by mouth.     No current facility-administered medications on file prior to visit.    Allergies:   Allergies  Allergen Reactions   Augmentin [Amoxicillin-Pot Clavulanate] Other (See Comments)    unk   Tramadol     Nausea    Flexeril [Cyclobenzaprine] Palpitations      OBJECTIVE:  Physical Exam  Vitals:   10/23/20 1235  BP: (!) 203/90  Pulse: 89  Weight: 87 lb (39.5 kg)  Height: 5\' 4"  (1.626 m)   Body mass  index is 14.93 kg/m. No results found.  General: well developed, well nourished,  pleasant elderly Caucasian female, seated, in no evident distress Head: head normocephalic and atraumatic.   Neck: supple with no carotid or supraclavicular bruits Cardiovascular: regular rate and rhythm, no murmurs Musculoskeletal: BLE pitting edema greater distally Vascular:  Normal pulses all extremities   Neurologic Exam Mental Status: Awake and fully alert.   Fluent speech and language.  Oriented to place and time. Recent and remote memory intact. Attention span, concentration and fund of knowledge appropriate. Mood and affect appropriate.  Cranial Nerves: Pupils equal, briskly reactive to light. Extraocular movements full without nystagmus. Visual fields full to confrontation. Hearing intact. Facial sensation intact. Face, tongue, palate moves normally and symmetrically.  Motor: Normal bulk and tone. Normal strength in all tested extremity muscles Sensory.: intact to touch , pinprick , position and vibratory sensation.  Coordination: Rapid alternating movements normal in all extremities. Finger-to-nose and heel-to-shin performed accurately bilaterally. Gait and Station: Arises from chair without difficulty. Stance is normal. Gait demonstrates normal stride length and balance without use of assistive device. Tandem walk and heel toe without difficulty.  Reflexes: 1+ and symmetric. Toes downgoing.        ASSESSMENT: Wendy Jackson is a 75 y.o. year old female with small acute infarct involving the right precentral postcentral gyri, embolic pattern most likely due to right ICA stenosis with high risk plaque on 4/18 s/p R CEA.  Vascular risk factors include right carotid stenosis, HTN, HLD, advanced age and questionable lupus diagnosis     PLAN:  R precentral postcentral gyri infarct:  Recovered well without residual deficit.  Advised to restart aspirin 81 mg daily as benefit greatly outweighs risk and  start Crestor 40 mg daily for secondary stroke prevention.   Hypercoagulable labs negative.   Cardiac monitor negative for atrial fibrillation Discussed secondary stroke prevention measures and importance of close PCP follow up for aggressive stroke risk factor management  Right ICA stenosis: s/p CEA 4/20. followed by [facility] - plans to repeat carotid duplex 9 months postprocedure HTN: BP goal <130/90.  Stable at home on losartan per PCP.   HLD: LDL goal <70.  Prior LDL 93 -intolerant to atorvastatin.  Initiate Crestor 40  mg daily.  Repeat lipid panel today.      Follow up in 6 months or call earlier if needed   CC:  PCP: Kuneff, Renee A, DO    I spent 24 minutes of face-to-face and non-face-to-face time with patient.  This included previsit chart review including extensive review of recent hospitalization pertinent progress notes, lab work and imaging, lab review still pending at discharge, study review, electronic health record documentation, and patient education and discussion regarding prior stroke and etiology, secondary stroke prevention measures and importance of managing stroke risk factors as well as medication compliance and indication for medications and answered all other questions to patient satisfaction  Frann Rider, AGNP-BC  [facility] [address] [place], [place] [address]  Phone [phone] Fax [phone] Note: This document was prepared with digital dictation and possible smart phrase technology. Any transcriptional errors that result from this process are unintentional.

## 2020-10-24 LAB — LIPID PANEL
Chol/HDL Ratio: 2.1 ratio (ref 0.0–4.4)
Cholesterol, Total: 196 mg/dL (ref 100–199)
HDL: 94 mg/dL (ref >39)
LDL Chol Calc (NIH): 88 mg/dL (ref 0–99)
Triglycerides: 80 mg/dL (ref 0–149)
VLDL Cholesterol Cal: 14 mg/dL (ref 5–40)

## 2020-10-26 DIAGNOSIS — H401133 Primary open-angle glaucoma, bilateral, severe stage: Secondary | ICD-10-CM | POA: Diagnosis not present

## 2020-10-26 DIAGNOSIS — Z79899 Other long term (current) drug therapy: Secondary | ICD-10-CM | POA: Diagnosis not present

## 2020-11-21 ENCOUNTER — Encounter: Payer: Medicare Other | Admitting: Family Medicine

## 2020-12-11 ENCOUNTER — Encounter: Payer: Self-pay | Admitting: Family Medicine

## 2020-12-11 ENCOUNTER — Other Ambulatory Visit: Payer: Self-pay

## 2020-12-11 ENCOUNTER — Ambulatory Visit (INDEPENDENT_AMBULATORY_CARE_PROVIDER_SITE_OTHER): Payer: Medicare Other | Admitting: Family Medicine

## 2020-12-11 VITALS — BP 199/87 | HR 87 | Temp 98.4°F | Ht 62.0 in | Wt 85.2 lb

## 2020-12-11 DIAGNOSIS — E039 Hypothyroidism, unspecified: Secondary | ICD-10-CM | POA: Insufficient documentation

## 2020-12-11 DIAGNOSIS — D6869 Other thrombophilia: Secondary | ICD-10-CM | POA: Insufficient documentation

## 2020-12-11 DIAGNOSIS — Z681 Body mass index (BMI) 19 or less, adult: Secondary | ICD-10-CM | POA: Insufficient documentation

## 2020-12-11 DIAGNOSIS — M329 Systemic lupus erythematosus, unspecified: Secondary | ICD-10-CM

## 2020-12-11 DIAGNOSIS — Z8673 Personal history of transient ischemic attack (TIA), and cerebral infarction without residual deficits: Secondary | ICD-10-CM

## 2020-12-11 DIAGNOSIS — E440 Moderate protein-calorie malnutrition: Secondary | ICD-10-CM | POA: Diagnosis not present

## 2020-12-11 MED ORDER — LOSARTAN POTASSIUM 50 MG PO TABS: 50.0000 mg | ORAL_TABLET | Freq: Every day | ORAL | 0 refills | Status: DC

## 2020-12-11 MED ORDER — ARMOUR THYROID 30 MG PO TABS: 30.0000 mg | ORAL_TABLET | Freq: Every day | ORAL | 1 refills | Status: DC

## 2020-12-11 NOTE — Patient Instructions (Addendum)
  Great to see you today.  I have refilled the medication(s) we provide.   If labs were collected, we will inform you of lab results once received either by echart message or telephone call.   - echart message- for normal results that have been seen by the patient already.   - telephone call: abnormal results or if patient has not viewed results in their echart.  Close follow up for blood pressure control.  Losartan dose increased to 50 mg a day.  Bring your blood pressure cuff with you for comparison to that appt.

## 2020-12-11 NOTE — Progress Notes (Signed)
Patient ID: Wendy Jackson, female  DOB: [birth year], 75 y.o.   MRN: [id number] Patient Care Team    Relationship Specialty Notifications Start End  [name], DO PCP - General Family Medicine  12/11/20   Frann Rider, NP Nurse Practitioner Neurology  05/23/20   Marlowe Sax, MD Referring Physician Ophthalmology  05/23/20   Dr. Gypsy Balsam Attending Physician Rheumatology  12/10/20    Comment: [place], [place]    Chief Complaint  Patient presents with   Establish Care    Last PCP, Dr.Cirigliano; [facility]     Subjective:  Wendy Jackson is a 75 y.o.  female present for TOC-cmc All past medical history, surgical history, allergies, family history, immunizations, medications and social history were updated in the electronic medical record today. All recent labs, ED visits and hospitalizations within the last year were reviewed.  Lupus: Patient reports she was diagnosed with lupus many years ago.  She was started on Plaquenil at her rheumatologist in [place].  Her original symptoms were some mild polyarthralgia.  She recently had to start seeing a new rheumatologist, in the same office, secondary to her original rheumatologist retiring.  Her new rheumatologist does not seem as confident in the diagnosis of lupus for her.  He has started to decrease her Plaquenil dose.  She plans to continue to follow-up with the rheumatologist in [place].  BMI less than 19,adult Patient reports she has always been rather small and has not weighed more than 100 pounds the majority of her life.  History of CVA (cerebrovascular accident)/acquired thrombophilia Patient reports April/2022 she suffered from a ischemic stroke.  She states the only symptom she had was paresthesia of her left hand.  She presented to the emergency room and was found to have an ischemic stroke secondary to right carotid artery stenosis.  She underwent right carotid endarterectomy.  She is established with neurology.  She has had  rather significant high blood pressure recordings per EMR.  She states that her home blood pressures were usually in the high 130s when on amlodipine 10 mg daily.  However she had significant swelling in her lower extremities and amlodipine was stopped and losartan 25 mg was started.  Swelling has subsided, however her blood pressures at home are still running in the 314H systolic.  She is prescribed Crestor through her neurology team.  She is compliant with baby aspirin daily.  She has suffered from no long-term deficits from her stroke.  Hypothyroidism, unspecified type Patient reports her Armour Thyroid 30 has been stable for some time.  Labs are due.  Depression screen [facility] 2/9 12/11/2020 05/29/2020 03/06/2020  Decreased Interest 0 0 0  Down, Depressed, Hopeless 0 0 0  PHQ - 2 Score 0 0 0   No flowsheet data found.      Fall Risk  12/11/2020 03/06/2020  Falls in the past year? 0 0  Number falls in past yr: 0 0  Injury with Fall? 0 0  Risk for fall due to : No Fall Risks -  Follow up Falls evaluation completed Falls prevention discussed     Immunization History  Administered Date(s) Administered   PFIZER(Purple Top)SARS-COV-2 Vaccination 02/20/2019, 03/16/2019, 11/11/2019, 06/15/2020   Tdap 08/04/2019    No results found.  Past Medical History:  Diagnosis Date   Allergy Spring 2010   Cataract March 2021   Glaucoma Approximately 2005   Hypertension 2022   Lupus ([place])    new rheum questioning original dx. continuing Plaquenil at  lower dose for now.   Positive TB test    Stroke [facility]) April 2022   Thyroid disease    Allergies  Allergen Reactions   Amlodipine Other (See Comments)    Bilateral lower extremity edema   Augmentin [Amoxicillin-Pot Clavulanate] Other (See Comments)    unk   Tramadol     Nausea    Flexeril [Cyclobenzaprine] Palpitations   Past Surgical History:  Procedure Laterality Date   ENDARTERECTOMY Right 05/02/2020   Procedure: RIGHT CAROTID  ENDARTERECTOMY;  Surgeon: Rosetta Posner, MD;  Location: [facility] OR;  Service: Vascular;  Laterality: Right;   EYE SURGERY  01/2020   SCLERAL BUCKLE Right 2004   TUBAL LIGATION  Feb 1982   Family History  Problem Relation Age of Onset   ALS Mother    Early death Mother    Dementia Father    Hypertension Father    Macular degeneration Maternal Aunt    Alcohol abuse Paternal Uncle    Alcohol abuse Paternal Uncle    Social History   Social History Narrative   Marital status/children/pets: Married.  2 children.   Education/employment: Retired.  College-educated.   Safety:      -smoke alarm in the home:Yes     - wears seatbelt: Yes     - Feels safe in their relationships: Yes       Allergies as of 12/11/2020       Reactions   Amlodipine Other (See Comments)   Bilateral lower extremity edema   Augmentin [amoxicillin-pot Clavulanate] Other (See Comments)   unk   Tramadol    Nausea   Flexeril [cyclobenzaprine] Palpitations        Medication List        Accurate as of December 11, 2020  7:26 PM. If you have any questions, ask your nurse or doctor.          STOP taking these medications    HAWTHORNE PO Stopped by: Howard Pouch, DO       TAKE these medications    Airborne Elderberry Chew Chew 1 tablet by mouth in the morning and at bedtime.   Armour Thyroid 30 MG tablet Generic drug: thyroid Take 1 tablet (30 mg total) by mouth daily.   aspirin EC 81 MG tablet Take 1 tablet (81 mg total) by mouth daily. Swallow whole.   cetirizine 10 MG chewable tablet Commonly known as: ZYRTEC Chew 10 mg by mouth daily.   hydroxychloroquine 200 MG tablet Commonly known as: PLAQUENIL Take 200 mg by mouth daily.   L-Carnitine 500 MG Caps Take 500 mg by mouth daily.   losartan 50 MG tablet Commonly known as: COZAAR Take 1 tablet (50 mg total) by mouth daily. What changed:  medication strength how much to take Changed by: Howard Pouch, DO   Lumigan 0.01 %  Soln Generic drug: bimatoprost Place 1 drop into the left eye at bedtime.   Resveratrol 50 MG Caps Take 50 mg by mouth daily.   rosuvastatin 40 MG tablet Commonly known as: Crestor Take 1 tablet (40 mg total) by mouth daily.   Simbrinza 1-0.2 % Susp Generic drug: Brinzolamide-Brimonidine Place 1 drop into the left eye in the morning, at noon, and at bedtime.   Ubiquinol 100 MG Caps Take 100 mg by mouth.   UP4 PROBIOTICS WOMENS PO Take 1 capsule by mouth daily.   Vitamin D-3 125 MCG (5000 UT) Tabs Take by mouth daily.        All past medical  history, surgical history, allergies, family history, immunizations andmedications were updated in the EMR today and reviewed under the history and medication portions of their EMR.     ROS: 14 pt review of systems performed and negative (unless mentioned in an HPI)  Objective: BP (!) 199/87   Pulse 87   Temp 98.4 F (36.9 C) (Oral)   Ht $R'5\' 2"'TX$  (1.575 m)   Wt 85 lb 3.2 oz (38.6 kg)   SpO2 99%   BMI 15.58 kg/m  Gen: Afebrile. No acute distress. Nontoxic in appearance, well-developed, well-nourished-thin female.  Very pleasant. HENT: AT. .  No cough.  No hoarseness. Eyes:Pupils Equal Round Reactive to light, Extraocular movements intact,  Conjunctiva without redness, discharge or icterus. Neck/lymp/endocrine: Supple, no lymphadenopathy, no thyromegaly CV: RRR no murmur, no edema Chest: CTAB, no wheeze, rhonchi or crackles.  Good respiratory effort.  Normal air movement. Skin: No rashes, purpura or petechiae. Warm and well-perfused. Skin intact. Neuro/Msk: Normal gait. PERLA. EOMi. Alert. Oriented x3.  Cranial nerves II through XII intact. Muscle strength 5/5 upper/lower extremity. DTRs equal bilaterally. Psych: Normal affect, dress and demeanor. Normal speech. Normal thought content and judgment.  Assessment/plan: Wendy Jackson is a 75 y.o. female present for TOC BMI less than 19,adult Patient reports she has always been rather  small and has not weighed more than 100 pounds the majority of her life. - TSH - Comp Met (CMET)  Hypertension/hyperlipidemia/history of CVA (cerebrovascular accident)/acquired thrombophilia Pressures today are significantly elevated.  Reviewed home blood pressures which are also elevated in the 150s. She had been on amlodipine 10 mg at 1 time per EMR review.  This caused swelling it was discontinued and low-dose losartan was started.  She likely does not have enough coverage for her blood pressure. Increase losartan to 50 mg with close follow-up.  Suspect we will need to continue to titrate this medication up. She will bring in her blood pressure cuff with her to her next appointment so that we can compare to ensure she is getting accurate readings at home. Low-sodium diet. Continue Crestor 40 mg daily prescribed by her neurology team. - Comp Met (CMET) She is established with neurology Reviewed records from neurology and inpatient admission for stroke.  Hypothyroidism, unspecified type TSH collected today. Continue Armour Thyroid 30 mg daily, unless labs indicate need to change dose. - ARMOUR THYROID 30 MG tablet; Take 1 tablet (30 mg total) by mouth daily.  Dispense: 90 tablet; Refill: 1  > 45 Minutes was dedicated to this patient's encounter to include pre-visit review of chart, face-to-face time with patient and post-visit work- which include documentation and prescribing medications and/or ordering test when necessary.     Return in about 4 weeks (around 01/08/2021) for CMC (30 min).  Orders Placed This Encounter  Procedures   TSH   Comp Met (CMET)    Meds ordered this encounter  Medications   losartan (COZAAR) 50 MG tablet    Sig: Take 1 tablet (50 mg total) by mouth daily.    Dispense:  90 tablet    Refill:  0   ARMOUR THYROID 30 MG tablet    Sig: Take 1 tablet (30 mg total) by mouth daily.    Dispense:  90 tablet    Refill:  1    Referral Orders  No referral(s)  requested today     Note is dictated utilizing voice recognition software. Although note has been proof read prior to signing, occasional typographical errors still can be missed.  If any questions arise, please do not hesitate to call for verification.  Electronically signed by: Howard Pouch, DO [place]

## 2020-12-12 LAB — COMPREHENSIVE METABOLIC PANEL
AG Ratio: 1.6 (calc) (ref 1.0–2.5)
ALT: 28 U/L (ref 6–29)
AST: 34 U/L (ref 10–35)
Albumin: 4.4 g/dL (ref 3.6–5.1)
Alkaline phosphatase (APISO): 70 U/L (ref 37–153)
BUN/Creatinine Ratio: 19 (calc) (ref 6–22)
BUN: 11 mg/dL (ref 7–25)
CO2: 26 mmol/L (ref 20–32)
Calcium: 9.6 mg/dL (ref 8.6–10.4)
Chloride: 101 mmol/L (ref 98–110)
Creat: 0.58 mg/dL — ABNORMAL LOW (ref 0.60–1.00)
Globulin: 2.7 g/dL (calc) (ref 1.9–3.7)
Glucose, Bld: 85 mg/dL (ref 65–99)
Potassium: 3.7 mmol/L (ref 3.5–5.3)
Sodium: 140 mmol/L (ref 135–146)
Total Bilirubin: 0.6 mg/dL (ref 0.2–1.2)
Total Protein: 7.1 g/dL (ref 6.1–8.1)

## 2020-12-12 LAB — TSH: TSH: 1.4 mIU/L (ref 0.40–4.50)

## 2021-01-11 ENCOUNTER — Encounter: Payer: Self-pay | Admitting: Family Medicine

## 2021-01-11 ENCOUNTER — Ambulatory Visit (INDEPENDENT_AMBULATORY_CARE_PROVIDER_SITE_OTHER): Payer: Medicare Other | Admitting: Family Medicine

## 2021-01-11 ENCOUNTER — Other Ambulatory Visit: Payer: Self-pay

## 2021-01-11 VITALS — BP 178/61 | HR 74 | Temp 98.4°F | Ht 62.0 in | Wt 87.0 lb

## 2021-01-11 DIAGNOSIS — D6869 Other thrombophilia: Secondary | ICD-10-CM | POA: Diagnosis not present

## 2021-01-11 DIAGNOSIS — Z8673 Personal history of transient ischemic attack (TIA), and cerebral infarction without residual deficits: Secondary | ICD-10-CM

## 2021-01-11 DIAGNOSIS — E034 Atrophy of thyroid (acquired): Secondary | ICD-10-CM | POA: Diagnosis not present

## 2021-01-11 DIAGNOSIS — I10 Essential (primary) hypertension: Secondary | ICD-10-CM | POA: Insufficient documentation

## 2021-01-11 DIAGNOSIS — E785 Hyperlipidemia, unspecified: Secondary | ICD-10-CM

## 2021-01-11 HISTORY — DX: Hyperlipidemia, unspecified: E78.5

## 2021-01-11 MED ORDER — LOSARTAN POTASSIUM 100 MG PO TABS: 50.0000 mg | ORAL_TABLET | Freq: Every day | ORAL | 1 refills | Status: DC

## 2021-01-11 MED ORDER — LOSARTAN POTASSIUM 100 MG PO TABS: 100.0000 mg | ORAL_TABLET | Freq: Every day | ORAL | 1 refills | Status: DC

## 2021-01-11 NOTE — Progress Notes (Signed)
Patient ID: Wendy Jackson, female  DOB: April 05, 1945, 75 y.o.   MRN: [id number] Patient Care Team    Relationship Specialty Notifications Start End  [name], DO PCP - General Family Medicine  12/11/20   Frann Rider, NP Nurse Practitioner Neurology  05/23/20   Marlowe Sax, MD Referring Physician Ophthalmology  05/23/20   Dr. Gypsy Balsam Attending Physician Rheumatology  12/10/20    Comment: [place], [place]    Chief Complaint  Patient presents with   Hypertension    [place]; pt is not fasting;  Pt brought her own BP machine with readings of 175/74 pulse 72    Subjective: Wendy Jackson is a 75 y.o.  female present for [facility] All past medical history, surgical history, allergies, family history, immunizations, medications and social history were updated in the electronic medical record today. All recent labs, ED visits and hospitalizations within the last year were reviewed.  Lupus: Patient reports she was diagnosed with lupus many years ago.  She was started on Plaquenil at her rheumatologist in [place].  Her original symptoms were some mild polyarthralgia.  She recently had to start seeing a new rheumatologist, in the same office, secondary to her original rheumatologist retiring.  Her new rheumatologist does not seem as confident in the diagnosis of lupus for her.  He has started to decrease her Plaquenil dose.  She plans to continue to follow-up with the rheumatologist in [place].  BMI less than 19,adult Patient reports she has always been rather small and has not weighed more than 100 pounds the majority of her life.  HTN/HLD/History of CVA (cerebrovascular accident)/acquired thrombophilia Pt reports compliance with losartan 50. Blood pressures ranges at home 150s routinely, sometimes 140s (had one 683 systolic). Patient denies chest pain, shortness of breath or lower extremity edema. Pt is taking a  daily baby ASA. Pt is  prescribed statin. Prior note: Patient reports  April/2022 she suffered from a ischemic stroke.  She states the only symptom she had was paresthesia of her left hand.  She presented to the emergency room and was found to have an ischemic stroke secondary to right carotid artery stenosis.  She underwent right carotid endarterectomy.  She is established with neurology.  She has had rather significant high blood pressure recordings per EMR.  She states that her home blood pressures were usually in the high 130s when on amlodipine 10 mg daily.  However she had significant swelling in her lower extremities and amlodipine was stopped and losartan 25 mg was started.  Swelling has subsided, however her blood pressures at home are still running in the 419Q systolic.  She is prescribed Crestor through her neurology team.  She is compliant with baby aspirin daily.  She has suffered from no long-term deficits from her stroke.  Hypothyroidism, unspecified type Patient reports her Armour Thyroid 30 has been stable for some time.  Labs were normal 12/2020  Depression screen [facility] 2/9 12/11/2020 05/29/2020 03/06/2020  Decreased Interest 0 0 0  Down, Depressed, Hopeless 0 0 0  PHQ - 2 Score 0 0 0   No flowsheet data found.      Fall Risk  12/11/2020 03/06/2020  Falls in the past year? 0 0  Number falls in past yr: 0 0  Injury with Fall? 0 0  Risk for fall due to : No Fall Risks -  Follow up Falls evaluation completed Falls prevention discussed   Immunization History  Administered Date(s) Administered   PFIZER(Purple Top)SARS-COV-2 Vaccination  02/20/2019, 03/16/2019, 11/11/2019, 06/15/2020   Tdap 08/04/2019    No results found.  Past Medical History:  Diagnosis Date   Allergy Spring 2010   Cataract March 2021   Glaucoma Approximately 2005   Hyperlipidemia LDL goal <70 01/11/2021   Hypertension 2022   Lupus ([place])    new rheum questioning original dx. continuing Plaquenil at lower dose for now.   Positive TB test    Stroke [facility]) April 2022    Thyroid disease    Allergies  Allergen Reactions   Amlodipine Other (See Comments)    Bilateral lower extremity edema   Augmentin [Amoxicillin-Pot Clavulanate] Other (See Comments)    unk   Tramadol     Nausea    Flexeril [Cyclobenzaprine] Palpitations   Past Surgical History:  Procedure Laterality Date   ENDARTERECTOMY Right 05/02/2020   Procedure: RIGHT CAROTID ENDARTERECTOMY;  Surgeon: Rosetta Posner, MD;  Location: [facility] OR;  Service: Vascular;  Laterality: Right;   EYE SURGERY  01/2020   SCLERAL BUCKLE Right 2004   TUBAL LIGATION  Feb 1982   Family History  Problem Relation Age of Onset   ALS Mother    Early death Mother    Dementia Father    Hypertension Father    Macular degeneration Maternal Aunt    Alcohol abuse Paternal Uncle    Alcohol abuse Paternal Uncle    Social History   Social History Narrative   Marital status/children/pets: Married.  2 children.   Education/employment: Retired.  College-educated.   Safety:      -smoke alarm in the home:Yes     - wears seatbelt: Yes     - Feels safe in their relationships: Yes       Allergies as of 01/11/2021       Reactions   Amlodipine Other (See Comments)   Bilateral lower extremity edema   Augmentin [amoxicillin-pot Clavulanate] Other (See Comments)   unk   Tramadol    Nausea   Flexeril [cyclobenzaprine] Palpitations        Medication List        Accurate as of January 11, 2021  1:43 PM. If you have any questions, ask your nurse or doctor.          Airborne [place]ur[place] 1 tablet by mouth in the morning and at bedtime.   Armour Thyroid 30 MG tablet Generic drug: thyroid Take 1 tablet (30 mg total) by mouth daily.   aspirin EC 81 MG tablet Take 1 tablet (81 mg total) by mouth daily. Swallow whole.   cetirizine 10 MG chewable tablet Commonly known as: ZYRTEC Chew 10 mg by mouth daily.   hydroxychloroquine 200 MG tablet Commonly known as: PLAQUENIL Take 200 mg by mouth daily.    L-Carnitine 500 MG Caps Take 500 mg by mouth daily.   losartan 100 MG tablet Commonly known as: COZAAR Take 1 tablet (100 mg total) by mouth daily. What changed:  medication strength how much to take Changed by: Howard Pouch, DO   Lumigan 0.01 % Soln Generic drug: bimatoprost Place 1 drop into the left eye at bedtime.   Resveratrol 50 MG Caps Take 50 mg by mouth daily.   rosuvastatin 40 MG tablet Commonly known as: Crestor Take 1 tablet (40 mg total) by mouth daily.   Simbrinza 1-0.2 % Susp Generic drug: Brinzolamide-Brimonidine Place 1 drop into the left eye in the morning, at noon, and at bedtime.   Ubiquinol 100 MG Caps Take 100 mg by  mouth.   UP4 PROBIOTICS WOMENS PO Take 1 capsule by mouth daily.   Vitamin D-3 125 MCG (5000 UT) Tabs Take by mouth daily.        All past medical history, surgical history, allergies, family history, immunizations andmedications were updated in the EMR today and reviewed under the history and medication portions of their EMR.     ROS: 14 pt review of systems performed and negative (unless mentioned in an HPI)  Objective: BP (!) 178/61    Pulse 74    Temp 98.4 F (36.9 C) (Oral)    Ht 5\' 2"  (1.575 m)    Wt 87 lb (39.5 kg)    SpO2 100%    BMI 15.91 kg/m  Physical Exam Vitals and nursing note reviewed.  Constitutional:      General: She is not in acute distress.    Appearance: Normal appearance. She is not ill-appearing, toxic-appearing or diaphoretic.  HENT:     Head: Normocephalic and atraumatic.     Mouth/Throat:     Mouth: Mucous membranes are moist.  Eyes:     General: No scleral icterus.       Right eye: No discharge.        Left eye: No discharge.     Extraocular Movements: Extraocular movements intact.     Conjunctiva/sclera: Conjunctivae normal.     Pupils: Pupils are equal, round, and reactive to light.  Cardiovascular:     Rate and Rhythm: Normal rate and regular rhythm.  Pulmonary:     Effort: Pulmonary  effort is normal. No respiratory distress.     Breath sounds: Normal breath sounds. No wheezing, rhonchi or rales.  Musculoskeletal:     Cervical back: Neck supple. No tenderness.  Lymphadenopathy:     Cervical: No cervical adenopathy.  Skin:    General: Skin is warm and dry.     Coloration: Skin is not jaundiced or pale.     Findings: No erythema or rash.  Neurological:     Mental Status: She is alert and oriented to person, place, and time. Mental status is at baseline.     Motor: No weakness.     Gait: Gait normal.  Psychiatric:        Mood and Affect: Mood normal.        Behavior: Behavior normal.        Thought Content: Thought content normal.        Judgment: Judgment normal.    Assessment/plan: Wendy Jackson is a 75 y.o. female present for  Hypertension/hyperlipidemia/history of CVA (cerebrovascular accident)/acquired thrombophilia Better BP- still above goal.  Increase losartan to 100 mg with close follow-up.   Continue ASA 81 Her home cuff is accurate in comparison today to our cuff readings  Low-sodium diet. Continue Crestor 40 mg daily prescribed by her neurology team. She is established with neurology  Hypothyroidism, unspecified type TSH normal 12/2020 Continue Armour Thyroid 30 mg daily  Return in about 16 weeks (around 05/03/2021) for CPE (30 min), CMC (30 min).  No orders of the defined types were placed in this encounter.   Meds ordered this encounter  Medications   DISCONTD: losartan (COZAAR) 100 MG tablet    Sig: Take 0.5 tablets (50 mg total) by mouth daily.    Dispense:  90 tablet    Refill:  1   losartan (COZAAR) 100 MG tablet    Sig: Take 1 tablet (100 mg total) by mouth daily.    Dispense:  90  tablet    Refill:  1    DC losartan 25 and 50 mg doses. Please only fill the 100 mg tab. Thank you    Referral Orders  No referral(s) requested today     Note is dictated utilizing voice recognition software. Although note has been proof read prior  to signing, occasional typographical errors still can be missed. If any questions arise, please do not hesitate to call for verification.  Electronically signed by: Howard Pouch, DO [place]

## 2021-01-11 NOTE — Patient Instructions (Addendum)
° °  Increase losartan to 100 mg a day. Once you pick up your new script it will be one tab daily again.   Follow up Mid April for physical and chronic conditions- we will collect fasting labs that day as well.   Goal BP is less than 130/80!

## 2021-01-28 ENCOUNTER — Ambulatory Visit: Payer: Medicare Other | Admitting: Family Medicine

## 2021-03-13 ENCOUNTER — Encounter: Payer: Self-pay | Admitting: Physician Assistant

## 2021-03-13 ENCOUNTER — Ambulatory Visit (HOSPITAL_COMMUNITY)
Admission: RE | Admit: 2021-03-13 | Discharge: 2021-03-13 | Disposition: A | Discharge status: Home / Self Care | Payer: Medicare Other | Source: Ambulatory Visit | Attending: Vascular Surgery | Admitting: Vascular Surgery

## 2021-03-13 ENCOUNTER — Other Ambulatory Visit: Payer: Self-pay

## 2021-03-13 ENCOUNTER — Ambulatory Visit: Payer: Medicare Other | Admitting: Physician Assistant

## 2021-03-13 VITALS — BP 198/79 | HR 73 | Temp 98.0°F | Resp 20 | Ht 62.0 in | Wt 87.8 lb

## 2021-03-13 DIAGNOSIS — I6523 Occlusion and stenosis of bilateral carotid arteries: Secondary | ICD-10-CM | POA: Diagnosis not present

## 2021-03-13 DIAGNOSIS — I63231 Cerebral infarction due to unspecified occlusion or stenosis of right carotid arteries: Secondary | ICD-10-CM | POA: Diagnosis not present

## 2021-03-13 NOTE — Progress Notes (Signed)
HISTORY AND PHYSICAL     CC:  follow up. Requesting Provider:  [name], DO  HPI: This is a 76 y.o. female here for follow up for carotid artery stenosis.  Pt is s/p right CEA for symptomatic carotid artery stenosis on 05/02/2020 by Dr. Donnetta Jackson.  She had left hand numbness and this was resolved at her last visit.  She also had some residual numbness below her right ear, which was improving.    [name] had revealed small acute infarct involving the right precentral and post central gyri.  She had ~ 50% stenosis of the proximal right ICA and no hemodynamically significant stenosis of the left ICA on MRI pre op.    Pt returns today for follow up.    Pt denies any amaurosis fugax, speech difficulties, weakness, numbness, paralysis or clumsiness or facial droop.   She states that she still has some numbness around her neck.  She is compliant with her asa/statin.  She does have an elevated blood pressure today but she takes her blood pressure at home regularly and it is consistently in the 130's.   She moved here from [facility] in 2020.  She states that her husband had his carotid surgery in 2015.   The pt is on a statin for cholesterol management.  The pt is on a daily aspirin.   Other AC:  none The pt is on ARB for hypertension.   The pt is not diabetic.   Tobacco hx:  never  Pt does not have family hx of AAA.  Past Medical History:  Diagnosis Date   Allergy Spring 2010   Cataract March 2021   Glaucoma Approximately 2005   Hyperlipidemia LDL goal <70 01/11/2021   Hypertension 2022   Lupus (Wendy Jackson)    new rheum questioning original dx. continuing Plaquenil at lower dose for now.   Positive TB test    Stroke [facility]) April 2022   Thyroid disease     Past Surgical History:  Procedure Laterality Date   ENDARTERECTOMY Right 05/02/2020   Procedure: RIGHT CAROTID ENDARTERECTOMY;  Surgeon: Wendy Posner, MD;  Location: [facility] OR;  Service: Vascular;  Laterality: Right;   EYE  SURGERY  01/2020   SCLERAL BUCKLE Right 2004   TUBAL LIGATION  Feb 1982    Allergies  Allergen Reactions   Amlodipine Other (See Comments)    Bilateral lower extremity edema   Augmentin [Amoxicillin-Pot Clavulanate] Other (See Comments)    unk   Tramadol     Nausea    Flexeril [Cyclobenzaprine] Palpitations    Current Outpatient Medications  Medication Sig Dispense Refill   ARMOUR THYROID 30 MG tablet Take 1 tablet (30 mg total) by mouth daily. 90 tablet 1   aspirin EC 81 MG tablet Take 1 tablet (81 mg total) by mouth daily. Swallow whole. 30 tablet 11   cetirizine (ZYRTEC) 10 MG chewable tablet Chew 10 mg by mouth daily.     Cholecalciferol (VITAMIN D-3) 125 MCG (5000 UT) TABS Take by mouth daily.     hydroxychloroquine (PLAQUENIL) 200 MG tablet Take 200 mg by mouth daily.     levOCARNitine L-Tartrate (L-CARNITINE) 500 MG CAPS Take 500 mg by mouth daily.     losartan (COZAAR) 100 MG tablet Take 1 tablet (100 mg total) by mouth daily. 90 tablet 1   LUMIGAN 0.01 % SOLN Place 1 drop into the left eye at bedtime.     Misc Natural Products (AIRBORNE ELDERBERRY) CHEW Chew  1 tablet by mouth in the morning and at bedtime.     Probiotic Product (UP4 PROBIOTICS WOMENS PO) Take 1 capsule by mouth daily.     Resveratrol 50 MG CAPS Take 50 mg by mouth daily.     rosuvastatin (CRESTOR) 40 MG tablet Take 1 tablet (40 mg total) by mouth daily. 30 tablet 5   SIMBRINZA 1-0.2 % SUSP Place 1 drop into the left eye in the morning, at noon, and at bedtime.     Ubiquinol 100 MG CAPS Take 100 mg by mouth.     No current facility-administered medications for this visit.    Family History  Problem Relation Age of Onset   ALS Mother    Early death Mother    Dementia Father    Hypertension Father    Macular degeneration Maternal Aunt    Alcohol abuse Paternal Uncle    Alcohol abuse Paternal Uncle     Social History   Socioeconomic History   Marital status: Married    Spouse name: Wendy Jackson    Number of children: Not on file   Years of education: Not on file   Highest education level: Not on file  Occupational History   Occupation: retired  Tobacco Use   Smoking status: Never    Passive exposure: Yes   Smokeless tobacco: Never  Vaping Use   Vaping Use: Never used  Substance and Sexual Activity   Alcohol use: Never   Drug use: Never   Sexual activity: Not Currently    Birth control/protection: Surgical  Other Topics Concern   Not on file  Social History Narrative   Marital status/children/pets: Married.  2 children.   Education/employment: Retired.  College-educated.   Safety:      -smoke alarm in the home:Yes     - wears seatbelt: Yes     - Feels safe in their relationships: Yes      Social Determinants of Health   Financial Resource Strain: Not on file  Food Insecurity: No Food Insecurity   Worried About Charity fundraiser in the Last Year: Never true   Ran Out of Food in the Last Year: Never true  Transportation Needs: No Transportation Needs   Lack of Transportation (Medical): No   Lack of Transportation (Non-Medical): No  Physical Activity: Not on file  Stress: No Stress Concern Present   Feeling of Stress : Only a little  Social Connections: Moderately Integrated   Frequency of Communication with Friends and Family: More than three times a week   Frequency of Social Gatherings with Friends and Family: More than three times a week   Attends Religious Services: More than 4 times per year   Active Member of [employer] or Organizations: No   Attends Music therapist: Never   Marital Status: Married  Human resources officer Violence: Not At Risk   Fear of Current or Ex-Partner: No   Emotionally Abused: No   Physically Abused: No   Sexually Abused: No     REVIEW OF SYSTEMS:   [X]  denotes positive finding, [ ]  denotes negative finding Cardiac  Comments:  Chest pain or chest pressure:    Shortness of breath upon exertion:    Short of breath when  lying flat:    Irregular heart rhythm:        Vascular    Pain in calf, thigh, or hip brought on by ambulation:    Pain in feet at night that wakes you up from your  sleep:     Blood clot in your veins:    Leg swelling:         Pulmonary    Oxygen at home:    Productive cough:     Wheezing:         Neurologic    Sudden weakness in arms or legs:     Sudden numbness in arms or legs:     Sudden onset of difficulty speaking or slurred speech:    Temporary loss of vision in one eye:     Problems with dizziness:         Gastrointestinal    Blood in stool:     Vomited blood:         Genitourinary    Burning when urinating:     Blood in urine:        Psychiatric    Major depression:         Hematologic    Bleeding problems:    Problems with blood clotting too easily:        Skin    Rashes or ulcers:        Constitutional    Fever or chills:      PHYSICAL EXAMINATION:  Today's Vitals   03/13/21 1226 03/13/21 1228  BP: (!) 202/74 (!) 198/79  Pulse: 73   Resp: 20   Temp: 98 F (36.7 C)   TempSrc: Temporal   SpO2: 100%   Weight: 87 lb 12.8 oz (39.8 kg)   Height: 5\' 2"  (1.575 m)    Body mass index is 16.06 kg/m.   General:  WDWN in NAD; vital signs documented above Gait: Not observed HENT: WNL, normocephalic Pulmonary: normal non-labored breathing Cardiac: regular HR, without carotid bruits Abdomen: soft, NT; aortic pulse is not palpable Skin: without rashes Vascular Exam/Pulses:  Right Left  Radial 2+ (normal) 2+ (normal)  DP 2+ (normal) 2+ (normal)   Extremities: without ischemic changes, without Gangrene , without cellulitis; without open wounds Musculoskeletal: no muscle wasting or atrophy  Neurologic: A&O X 3; moving all extremities equally; speech is fluent/normal Psychiatric:  The pt has Normal affect.   Non-Invasive Vascular Imaging:   Carotid Duplex on 03/13/2021: Right:  1-39% ICA stenosis Left:  1-39% ICA stenosis Vertebrals:  Bilateral  vertebral arteries demonstrate antegrade flow.  Subclavians: Normal flow hemodynamics were seen in bilateral subclavian arteries.   Previous CTA head/neck on 05/01/2020: IMPRESSION: 1. No intracranial arterial occlusion or high-grade stenosis. 2. 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 (Left without hemodynamically significant stenosis)   ASSESSMENT/PLAN:: 76 y.o. female here for follow up carotid artery stenosis and is s/p right CEA for symptomatic carotid artery stenosis on 05/02/2020 by Dr. Donnetta Jackson.   -duplex today reveals 1-39% bilateral ICA stenosis -discussed s/s of stroke with pt and she understands should she develop any of these sx, she will go to the nearest ER or call 911. -pt will f/u in one year with carotid duplex -pt will call sooner should they have any issues. -continue statin/asa   Leontine Locket, [facility] [phone]  Clinic MD:  Donzetta Matters

## 2021-04-02 DIAGNOSIS — H401133 Primary open-angle glaucoma, bilateral, severe stage: Secondary | ICD-10-CM | POA: Insufficient documentation

## 2021-04-02 DIAGNOSIS — H33312 Horseshoe tear of retina without detachment, left eye: Secondary | ICD-10-CM | POA: Diagnosis not present

## 2021-04-02 DIAGNOSIS — Z961 Presence of intraocular lens: Secondary | ICD-10-CM

## 2021-04-02 DIAGNOSIS — Z79899 Other long term (current) drug therapy: Secondary | ICD-10-CM | POA: Insufficient documentation

## 2021-04-02 DIAGNOSIS — H35371 Puckering of macula, right eye: Secondary | ICD-10-CM | POA: Diagnosis not present

## 2021-04-02 DIAGNOSIS — H33021 Retinal detachment with multiple breaks, right eye: Secondary | ICD-10-CM | POA: Diagnosis not present

## 2021-04-02 HISTORY — DX: Presence of intraocular lens: Z96.1

## 2021-04-02 HISTORY — DX: Horseshoe tear of retina without detachment, left eye: H33.312

## 2021-04-02 HISTORY — DX: Retinal detachment with multiple breaks, right eye: H33.021

## 2021-04-02 HISTORY — DX: Puckering of macula, right eye: H35.371

## 2021-04-02 HISTORY — DX: Other long term (current) drug therapy: Z79.899

## 2021-04-03 ENCOUNTER — Telehealth: Payer: Self-pay | Admitting: Family Medicine

## 2021-04-03 NOTE — Telephone Encounter (Signed)
Left message for patient to schedule Annual Wellness Visit.  Please schedule (telephone/video call) with Nurse Health Advisor Tina Betterson, RN at [facility]. Please call [phone] ask for Kathy 

## 2021-04-09 ENCOUNTER — Telehealth: Payer: Self-pay

## 2021-04-09 NOTE — Telephone Encounter (Signed)
Called pt to schedule AWV. Please schedule with health coach, Toria or Jean Alejos. ? ?

## 2021-04-18 ENCOUNTER — Other Ambulatory Visit: Payer: Self-pay | Admitting: *Deleted

## 2021-04-18 MED ORDER — ROSUVASTATIN CALCIUM 40 MG PO TABS: 40.0000 mg | ORAL_TABLET | Freq: Every day | ORAL | 5 refills | Status: DC

## 2021-05-06 ENCOUNTER — Ambulatory Visit (INDEPENDENT_AMBULATORY_CARE_PROVIDER_SITE_OTHER): Payer: Medicare Other | Admitting: Family Medicine

## 2021-05-06 ENCOUNTER — Encounter: Payer: Self-pay | Admitting: Family Medicine

## 2021-05-06 ENCOUNTER — Other Ambulatory Visit: Payer: Self-pay | Admitting: Family Medicine

## 2021-05-06 VITALS — BP 193/72 | HR 76 | Temp 97.3°F | Ht 62.5 in | Wt 87.0 lb

## 2021-05-06 DIAGNOSIS — Z1159 Encounter for screening for other viral diseases: Secondary | ICD-10-CM | POA: Diagnosis not present

## 2021-05-06 DIAGNOSIS — Z681 Body mass index (BMI) 19 or less, adult: Secondary | ICD-10-CM | POA: Diagnosis not present

## 2021-05-06 DIAGNOSIS — M329 Systemic lupus erythematosus, unspecified: Secondary | ICD-10-CM

## 2021-05-06 DIAGNOSIS — Z0000 Encounter for general adult medical examination without abnormal findings: Secondary | ICD-10-CM

## 2021-05-06 DIAGNOSIS — I10 Essential (primary) hypertension: Secondary | ICD-10-CM | POA: Diagnosis not present

## 2021-05-06 DIAGNOSIS — E2839 Other primary ovarian failure: Secondary | ICD-10-CM

## 2021-05-06 DIAGNOSIS — E039 Hypothyroidism, unspecified: Secondary | ICD-10-CM

## 2021-05-06 DIAGNOSIS — E785 Hyperlipidemia, unspecified: Secondary | ICD-10-CM | POA: Diagnosis not present

## 2021-05-06 DIAGNOSIS — I2510 Atherosclerotic heart disease of native coronary artery without angina pectoris: Secondary | ICD-10-CM | POA: Diagnosis not present

## 2021-05-06 DIAGNOSIS — Z8673 Personal history of transient ischemic attack (TIA), and cerebral infarction without residual deficits: Secondary | ICD-10-CM

## 2021-05-06 DIAGNOSIS — Z1231 Encounter for screening mammogram for malignant neoplasm of breast: Secondary | ICD-10-CM

## 2021-05-06 DIAGNOSIS — Z2882 Immunization not carried out because of caregiver refusal: Secondary | ICD-10-CM | POA: Insufficient documentation

## 2021-05-06 DIAGNOSIS — Z79899 Other long term (current) drug therapy: Secondary | ICD-10-CM

## 2021-05-06 DIAGNOSIS — D6869 Other thrombophilia: Secondary | ICD-10-CM

## 2021-05-06 DIAGNOSIS — Z1211 Encounter for screening for malignant neoplasm of colon: Secondary | ICD-10-CM

## 2021-05-06 LAB — TSH: TSH: 1.84 u[IU]/mL (ref 0.35–5.50)

## 2021-05-06 LAB — LIPID PANEL
Cholesterol: 161 mg/dL (ref 0–200)
HDL: 91.4 mg/dL (ref >39.00)
LDL Cholesterol: 56 mg/dL (ref 0–99)
NonHDL: 69.31
Total CHOL/HDL Ratio: 2
Triglycerides: 69 mg/dL (ref 0.0–149.0)
VLDL: 13.8 mg/dL (ref 0.0–40.0)

## 2021-05-06 LAB — COMPREHENSIVE METABOLIC PANEL
ALT: 27 U/L (ref 0–35)
AST: 35 U/L (ref 0–37)
Albumin: 4.4 g/dL (ref 3.5–5.2)
Alkaline Phosphatase: 72 U/L (ref 39–117)
BUN: 15 mg/dL (ref 6–23)
CO2: 29 mEq/L (ref 19–32)
Calcium: 9.2 mg/dL (ref 8.4–10.5)
Chloride: 100 mEq/L (ref 96–112)
Creatinine, Ser: 0.66 mg/dL (ref 0.40–1.20)
GFR: 86.45 mL/min (ref >60.00)
Glucose, Bld: 86 mg/dL (ref 70–99)
Potassium: 3.8 mEq/L (ref 3.5–5.1)
Sodium: 139 mEq/L (ref 135–145)
Total Bilirubin: 0.6 mg/dL (ref 0.2–1.2)
Total Protein: 7.1 g/dL (ref 6.0–8.3)

## 2021-05-06 LAB — CBC
HCT: 39.7 % (ref 36.0–46.0)
Hemoglobin: 13.1 g/dL (ref 12.0–15.0)
MCHC: 33.1 g/dL (ref 30.0–36.0)
MCV: 95.4 fl (ref 78.0–100.0)
Platelets: 170 10*3/uL (ref 150.0–400.0)
RBC: 4.16 Mil/uL (ref 3.87–5.11)
RDW: 12.5 % (ref 11.5–15.5)
WBC: 5.1 10*3/uL (ref 4.0–10.5)

## 2021-05-06 LAB — HEMOGLOBIN A1C: Hgb A1c MFr Bld: 5.7 % (ref 4.6–6.5)

## 2021-05-06 LAB — T4, FREE: Free T4: 0.69 ng/dL (ref 0.60–1.60)

## 2021-05-06 MED ORDER — LOSARTAN POTASSIUM 100 MG PO TABS: 100.0000 mg | ORAL_TABLET | Freq: Every day | ORAL | 1 refills | Status: DC

## 2021-05-06 MED ORDER — SHINGRIX 50 MCG/0.5ML IM SUSR: 0.5000 mL | Freq: Once | INTRAMUSCULAR | 1 refills | Status: AC

## 2021-05-06 MED ORDER — ARMOUR THYROID 30 MG PO TABS: 30.0000 mg | ORAL_TABLET | Freq: Every day | ORAL | 4 refills | Status: DC

## 2021-05-06 MED ORDER — VERAPAMIL HCL ER 120 MG PO TBCR: 120.0000 mg | EXTENDED_RELEASE_TABLET | Freq: Every day | ORAL | 1 refills | Status: DC

## 2021-05-06 NOTE — Progress Notes (Signed)
? ?This visit occurred during the SARS-CoV-2 public health emergency.  Safety protocols were in place, including screening questions prior to the visit, additional usage of staff PPE, and extensive cleaning of exam room while observing appropriate contact time as indicated for disinfecting solutions.  ? ? ?Patient ID: Wendy Jackson, female  DOB: Aug 10, 1945, 77 y.o.   MRN: [id number] ?Patient Care Team  ?  Relationship Specialty Notifications Start End  ?[name], DO PCP - General Family Medicine  12/11/20   ?Frann Rider, NP Nurse Practitioner Neurology  05/23/20   ?Marlowe Sax, MD Referring Physician Ophthalmology  05/23/20   ?Dr. Gypsy Balsam Attending Physician Rheumatology  12/10/20   ? Comment: [place], [place]  ? ? ?Chief Complaint  ?Patient presents with  ? Annual Exam  ?  Pt is fasting  ? ? ?Subjective: ?Wendy Jackson is a 76 y.o.  Female  present for CPE. ?All past medical history, surgical history, allergies, family history, immunizations, medications and social history were updated in the electronic medical record today. ?All recent labs, ED visits and hospitalizations within the last year were reviewed. ? ?Health maintenance:  ?Colonoscopy: never- cologuard ordered today ?Mammogram: completed:[place], [place] of state  ?Cervical cancer screening: n/a ?Immunizations: tdap UTD 2021, Influenza declined (encouraged yearly), PNA series declined, zostavax printed for her. ?Infectious disease screening:Hep C agreeable to testing today ?DEXA:ordered today ?Assistive device: none ?Oxygen JJO:ACZY ?Patient has a Dental home. ?Hospitalizations/ED visits: reviewed ? ?Lupus: ?Patient reports she was diagnosed with lupus many years ago.  She was started on Plaquenil at her rheumatologist in [place].  Her original symptoms were some mild polyarthralgia.  She recently had to start seeing a new rheumatologist, in the same office, secondary to her original rheumatologist retiring.  Her new rheumatologist does not  seem as confident in the diagnosis of lupus for her.  He has started to decrease her Plaquenil dose.  She plans to continue to follow-up with the rheumatologist in [place]. ?  ?BMI less than 19,adult ?Patient reports she has always been rather small and has not weighed more than 100 pounds the majority of her life. ?  ?HTN/HLD/History of CVA (cerebrovascular accident)/acquired thrombophilia ?Pt reports compliance with losartan 100. Patient denies chest pain, shortness of breath, dizziness or lower extremity edema.  ?Home pressures are in the 150s typically, better in evening 140s. She took her losartan 100 mg before 7 am today.  ? Pt is taking a  daily baby ASA. Pt is  prescribed statin. ?Prior note: ?Patient reports April/2022 she suffered from a ischemic stroke.  She states the only symptom she had was paresthesia of her left hand.  She presented to the emergency room and was found to have an ischemic stroke secondary to right carotid artery stenosis.  She underwent right carotid endarterectomy.  She is established with neurology.  She has had rather significant high blood pressure recordings per EMR.  She states that her home blood pressures were usually in the high 130s when on amlodipine 10 mg daily.  However she had significant swelling in her lower extremities and amlodipine was stopped and losartan 25 mg was started.  Swelling has subsided, however her blood pressures at home are still running in the 606T systolic.  She is prescribed Crestor through her neurology team.  She is compliant with baby aspirin daily.  She has suffered from no long-term deficits from her stroke. ?  ?Hypothyroidism, unspecified type ?Patient reports her Armour Thyroid 30 has been stable for some time.  compliant with medication.  ? ?  12/11/2020  ?  1:46 PM 05/29/2020  ?  6:18 PM 03/06/2020  ?  3:56 PM  ?Depression screen PHQ 2/9  ?Decreased Interest 0 0 0  ?Down, Depressed, Hopeless 0 0 0  ?PHQ - 2 Score 0 0 0  ? ?   ? View : No  data to display.  ?  ?  ?  ? ? ?Immunization History  ?Administered Date(s) Administered  ? PFIZER(Purple Top)SARS-COV-2 Vaccination 02/20/2019, 03/16/2019, 11/11/2019, 06/15/2020  ? Pension scheme manager 78yr & up 10/05/2020  ? Tdap 08/04/2019  ? ? ?Past Medical History:  ?Diagnosis Date  ? Allergy Spring 2010  ? Cataract March 2021  ? Glaucoma Approximately 2005  ? [place] retinal tear of left eye 04/02/2021  ? Hyperlipidemia LDL goal <70 01/11/2021  ? Hypertension 2022  ? Long-term use of Plaquenil 04/02/2021  ? [place]   ? new rheum questioning original dx. continuing Plaquenil at lower dose for now.  ? 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  ? Positive TB test   ? Pseudophakia of both eyes 04/02/2021  ? Retinal detachment with multiple breaks, right eye 04/02/2021  ? Stroke ([facility] 2022  ? Thyroid disease   ? ?Allergies  ?Allergen Reactions  ? Amlodipine Other (See Comments)  ?  Bilateral lower extremity edema  ? Augmentin [Amoxicillin-Pot Clavulanate] Other (See Comments)  ?  unk  ? Tramadol   ?  Nausea ?  ? Flexeril [Cyclobenzaprine] Palpitations  ? ?Past Surgical History:  ?Procedure Laterality Date  ? ENDARTERECTOMY Right 05/02/2020  ? Procedure: RIGHT CAROTID ENDARTERECTOMY;  Surgeon: ERosetta Posner MD;  [facility] [facility]  Service: Vascular;  Laterality: Right;  ? EYE SURGERY  01/2020  ? SCLERAL BUCKLE Right 2004  ? TUBAL LIGATION  Feb 1982  ? ?Family History  ?Problem Relation Age of Onset  ? ALS Mother   ? Early death Mother   ? Dementia Father   ? Hypertension Father   ? Macular degeneration Maternal Aunt   ? Alcohol abuse Paternal Uncle   ? Alcohol abuse Paternal Uncle   ? ?Social History  ? ?Social History Narrative  ? Marital status/children/pets: Married.  2 children.  ? Education/employment: Retired.  College-educated.  ? Safety:   ?   -smoke alarm in the home:Yes  ?   - wears seatbelt: Yes  ?   - Feels safe in their relationships: Yes  ?   ? ? ?Allergies as of 05/06/2021   ? ?    Reactions  ? Amlodipine Other (See Comments)  ? Bilateral lower extremity edema  ? Augmentin [amoxicillin-pot Clavulanate] Other (See Comments)  ? unk  ? Tramadol   ? Nausea  ? Flexeril [cyclobenzaprine] Palpitations  ? ?  ? ?  ?Medication List  ?  ? ?  ? Accurate as of May 06, 2021 11:55 AM. If you have any questions, ask your nurse or doctor.  ?  ?  ? ?  ? ?Airborne [employer]?Chew 1 tablet by mouth in the morning and at bedtime. ?  ?Armour Thyroid 30 MG tablet ?Generic drug: thyroid ?Take 1 tablet (30 mg total) by mouth daily. ?  ?aspirin EC 81 MG tablet ?Take 1 tablet (81 mg total) by mouth daily. Swallow whole. ?  ?cetirizine 10 MG chewable tablet ?Commonly known as: ZYRTEC ?Chew 10 mg by mouth daily. ?  ?hydroxychloroquine 200 MG tablet ?Commonly known as: PLAQUENIL ?Take 200 mg by mouth  daily. ?  ?L-Carnitine 500 MG Caps ?Take 500 mg by mouth daily. ?  ?losartan 100 MG tablet ?Commonly known as: COZAAR ?Take 1 tablet (100 mg total) by mouth daily. ?  ?Lumigan 0.01 % Soln ?Generic drug: bimatoprost ?Place 1 drop into the left eye at bedtime. ?  ?Resveratrol 50 MG Caps ?Take 50 mg by mouth daily. ?  ?rosuvastatin 40 MG tablet ?Commonly known as: Crestor ?Take 1 tablet (40 mg total) by mouth daily. ?  ?Shingrix injection ?Generic drug: Zoster Vaccine Adjuvanted ?Inject 0.5 mLs into the muscle once for 1 dose. Rpt in 2-6 mos once ?Started by: Howard Pouch, DO ?  ?Simbrinza 1-0.2 % Susp ?Generic drug: Brinzolamide-Brimonidine ?Place 1 drop into the left eye in the morning, at noon, and at bedtime. ?  ?Ubiquinol 100 MG Caps ?Take 100 mg by mouth. ?  ?UP4 PROBIOTICS WOMENS PO ?Take 1 capsule by mouth daily. ?  ?verapamil 120 MG CR tablet ?Commonly known as: CALAN-SR ?Take 1 tablet (120 mg total) by mouth at bedtime. ?Started by: Howard Pouch, DO ?  ?Vitamin D-3 125 MCG (5000 UT) Tabs ?Take by mouth daily. ?  ? ?  ? ? ?All past medical history, surgical history, allergies, family history, immunizations  andmedications were updated in the EMR today and reviewed under the history and medication portions of their EMR.    ? ?No results found for this or any previous visit (from the past 2160 hour(s)). ? ?ROS ?[place]

## 2021-05-06 NOTE — Patient Instructions (Addendum)
?Great to see you today.  ?I have refilled the medication(s) we provide.  ? ?If labs were collected, we will inform you of lab results once received either by echart message or telephone call.  ? - echart message- for normal results that have been seen by the patient already.  ? - telephone call: abnormal results or if patient has not viewed results in their echart. ? ?Return in about 4 weeks (around 06/03/2021) for htn follow up. ? ?Health Maintenance, Female ?Adopting a healthy lifestyle and getting preventive care are important in promoting health and wellness. Ask your health care provider about: ?The right schedule for you to have regular tests and exams. ?Things you can do on your own to prevent diseases and keep yourself healthy. ?What should I know about diet, weight, and exercise? ?Eat a healthy diet ? ?Eat a diet that includes plenty of vegetables, fruits, low-fat dairy products, and lean protein. ?Do not eat a lot of foods that are high in solid fats, added sugars, or sodium. ?Maintain a healthy weight ?Body mass index (BMI) is used to identify weight problems. It estimates body fat based on height and weight. Your health care provider can help determine your BMI and help you achieve or maintain a healthy weight. ?Get regular exercise ?Get regular exercise. This is one of the most important things you can do for your health. Most adults should: ?Exercise for at least 150 minutes each week. The exercise should increase your heart rate and make you sweat (moderate-intensity exercise). ?Do strengthening exercises at least twice a week. This is in addition to the moderate-intensity exercise. ?Spend less time sitting. Even light physical activity can be beneficial. ?Watch cholesterol and blood lipids ?Have your blood tested for lipids and cholesterol at 75 years of age, then have this test every 5 years. ?Have your cholesterol levels checked more often if: ?Your lipid or cholesterol levels are high. ?You are  older than 75 years of age. ?You are at high risk for heart disease. ?What should I know about cancer screening? ?Depending on your health history and family history, you may need to have cancer screening at various ages. This may include screening for: ?Breast cancer. ?Cervical cancer. ?Colorectal cancer. ?Skin cancer. ?Lung cancer. ?What should I know about heart disease, diabetes, and high blood pressure? ?Blood pressure and heart disease ?High blood pressure causes heart disease and increases the risk of stroke. This is more likely to develop in people who have high blood pressure readings or are overweight. ?Have your blood pressure checked: ?Every 3-5 years if you are 63-34 years of age. ?Every year if you are 68 years old or older. ?Diabetes ?Have regular diabetes screenings. This checks your fasting blood sugar level. Have the screening done: ?Once every three years after age 64 if you are at a normal weight and have a low risk for diabetes. ?More often and at a younger age if you are overweight or have a high risk for diabetes. ?What should I know about preventing infection? ?Hepatitis B ?If you have a higher risk for hepatitis B, you should be screened for this virus. Talk with your health care provider to find out if you are at risk for hepatitis B infection. ?Hepatitis C ?Testing is recommended for: ?Everyone born from [birth year] through 1964. ?Anyone with known risk factors for hepatitis C. ?Sexually transmitted infections (STIs) ?Get screened for STIs, including gonorrhea and chlamydia, if: ?You are sexually active and are younger than 75 years of age. ?You  are older than 75 years of age and your health care provider tells you that you are at risk for this type of infection. ?Your sexual activity has changed since you were last screened, and you are at increased risk for chlamydia or gonorrhea. Ask your health care provider if you are at risk. ?Ask your health care provider about whether you are at high risk  for HIV. Your health care provider may recommend a prescription medicine to help prevent HIV infection. If you choose to take medicine to prevent HIV, you should first get tested for HIV. You should then be tested every 3 months for as long as you are taking the medicine. ?Pregnancy ?If you are about to stop having your period (premenopausal) and you may become pregnant, seek counseling before you get pregnant. ?Take 400 to 800 micrograms (mcg) of folic acid every day if you become pregnant. ?Ask for birth control (contraception) if you want to prevent pregnancy. ?Osteoporosis and menopause ?Osteoporosis is a disease in which the bones lose minerals and strength with aging. This can result in bone fractures. If you are 41 years old or older, or if you are at risk for osteoporosis and fractures, ask your health care provider if you should: ?Be screened for bone loss. ?Take a calcium or vitamin D supplement to lower your risk of fractures. ?Be given hormone replacement therapy (HRT) to treat symptoms of menopause. ?Follow these instructions at home: ?Alcohol use ?Do not drink alcohol if: ?Your health care provider tells you not to drink. ?You are pregnant, may be pregnant, or are planning to become pregnant. ?If you drink alcohol: ?Limit how much you have to: ?0-1 drink a day. ?Know how much alcohol is in your drink. In the [place] one drink equals one 12 oz bottle of beer (355 mL), one 5 oz glass of wine (148 mL), or one 1? oz glass of hard liquor (44 mL). ?Lifestyle ?Do not use any products that contain nicotine or tobacco. These products include cigarettes, chewing tobacco, and vaping devices, such as e-cigarettes. If you need help quitting, ask your health care provider. ?Do not use street drugs. ?Do not share needles. ?Ask your health care provider for help if you need support or information about quitting drugs. ?General instructions ?Schedule regular health, dental, and eye exams. ?Stay current with your  vaccines. ?Tell your health care provider if: ?You often feel depressed. ?You have ever been abused or do not feel safe at home. ?Summary ?Adopting a healthy lifestyle and getting preventive care are important in promoting health and wellness. ?Follow your health care provider's instructions about healthy diet, exercising, and getting tested or screened for diseases. ?Follow your health care provider's instructions on monitoring your cholesterol and blood pressure. ?This information is not intended to replace advice given to you by your health care provider. Make sure you discuss any questions you have with your health care provider. ?Document Revised: 05/21/2020 Document Reviewed: 05/21/2020 ?Elsevier Patient Education ? [place]. ? ?

## 2021-05-07 ENCOUNTER — Telehealth: Payer: Self-pay

## 2021-05-07 ENCOUNTER — Encounter: Payer: Self-pay | Admitting: Adult Health

## 2021-05-07 ENCOUNTER — Ambulatory Visit: Payer: Medicare Other | Admitting: Adult Health

## 2021-05-07 VITALS — BP 187/77 | HR 71 | Ht 63.0 in | Wt 88.0 lb

## 2021-05-07 DIAGNOSIS — I63231 Cerebral infarction due to unspecified occlusion or stenosis of right carotid arteries: Secondary | ICD-10-CM | POA: Diagnosis not present

## 2021-05-07 LAB — HEPATITIS C ANTIBODY
Hepatitis C Ab: NONREACTIVE
SIGNAL TO CUT-OFF: 0.03 (ref <1.00)

## 2021-05-07 NOTE — Telephone Encounter (Signed)
Fax received from pharmacy stating that pt has allergy to calcium channel blockers and wanted to know if pt will be ok to take? ?

## 2021-05-07 NOTE — Patient Instructions (Signed)
Continue aspirin 81 mg daily  and Crestor  for secondary stroke prevention  Continue to follow up with PCP regarding blood pressure and cholesterol management  Maintain strict control of hypertension with blood pressure goal below 130/90 and cholesterol with LDL cholesterol (bad cholesterol) goal below 70 mg/dL.   Signs of a Stroke? Follow the BEFAST method:  Balance Watch for a sudden loss of balance, trouble with coordination or vertigo Eyes Is there a sudden loss of vision in one or both eyes? Or double vision?  Face: Ask the person to smile. Does one side of the face droop or is it numb?  Arms: Ask the person to raise both arms. Does one arm drift downward? Is there weakness or numbness of a leg? Speech: Ask the person to repeat a simple phrase. Does the speech sound slurred/strange? Is the person confused ? Time: If you observe any of these signs, call 911.       Thank you for coming to see us at [facility]. I hope we have been able to provide you high quality care today.  You may receive a patient satisfaction survey over the next few weeks. We would appreciate your feedback and comments so that we may continue to improve ourselves and the health of our patients.  

## 2021-05-07 NOTE — Telephone Encounter (Signed)
Patient has questions about meds that was prescribed yesterday.  Verapamil in the same family as another med she had problems with before ? ?Please call [phone] ?

## 2021-05-07 NOTE — Telephone Encounter (Signed)
She does not have an allergy against calcium channel blockers.   ?She experienced lower extremity edema on high-dose amlodipine. ?Fill the verapamil please ?

## 2021-05-07 NOTE — Telephone Encounter (Signed)
Pt informed

## 2021-05-07 NOTE — Telephone Encounter (Signed)
Spoke with pt regarding med and informed her that side effects are a possibility but not a guarantee  ?

## 2021-05-07 NOTE — Progress Notes (Signed)
?Wendy Jackson ?Wendy Jackson ?[address]. [place] [address] ?[phone] ? ?     STROKE FOLLOW UP NOTE ? ?[name] ?Date of Birth:  14-May-1945 ?Medical Record Number:  [id number]  ? ?Reason for Referral: stroke follow up ? ? ? ?SUBJECTIVE: ? ? ?CHIEF COMPLAINT:  ?Chief Complaint  ?Patient presents with  ? Follow-up  ?  RM 3 alone  ?Pt is well and stable, no new concerns   ? ? ?HPI: ? ?Update 05/07/2021 JM: Patient returns for 17-monthstroke follow-up unaccompanied.  Overall stable without new or reoccurring stroke/TIA symptoms.  She continues to maintain all ADLs and IADLs independently.  Compliant on aspirin and Crestor, denies side effects.  Blood pressure today elevated, reports change to BP medications by PCP yesterday trying to get this more under control.  Closely monitors at home and averages 130s but has noticed this being slightly higher in the morning and afternoon.  Closely followed by PCP Dr. KRaoul Pitchand vascular surgery with recent carotid duplex showing bilateral ICA 1 to 39% stenosis and plans on 1 year follow-up.  No new concerns at this time. ? ? ? ?History provided for reference purposes only ?Update 10/23/2020 JM: Returns for 448-monthtroke follow-up unaccompanied.  Overall doing well.  Denies new or reoccurring stroke/TIA symptoms.  Self discontinued aspirin due to concern of possible bleeding risk .  Self d/c'd atrovastatin due to skin reaction which stopped after taking. Blood pressure today elevated (typically elevated at appointments) - stable at home typically 130s/70s.  Cardiac monitor negative for atrial fibrillation.  No new concerns at this time. ? ?Initial visit 06/06/2020 JM: [name] being seen for hospital follow-up accompanied by her husband, Wendy Jackson ?She has been doing well since discharge without new or reoccurring stroke/TIA symptoms.  She was initially feeling increased fatigue, brain fog and increased anxiety but this is all improved and currently feels at  baseline. ?She has completed 3 weeks DAPT and remains on aspirin alone without associated side effects ?Remains on atorvastatin 40 mg daily without associated side effects ?Blood pressure today 163/73 but reports typically elevated at provider appointments -routinely monitors at home with SBP typically 130s.  Per pt, change of antihypertensives yesterday from amlodipine to losartan due to lower extremity swelling ? ?Cardiac monitor currently wearing til Saturday ? ?She has since had follow-up with vascular surgery and PCP ?She does still feel some numbness and pain at incision site but this has been gradually improving ? ?She plans on receiving her second COVID-19 booster on Friday ? ?Stroke admission 04/30/2020 ?[name] an [age].o. female with PMH of lupus and thyroid disease who presented on 04/30/2020 to [facility] for evaluation of new onset left hand numbness and weakness with presyncopal sensation and unsteady gait. The patient first noticed the symptoms about 2-3 days ago, followed by resolution. The symptoms returned the day of admission, so she went to the ED for evaluation.  Personally reviewed hospitalization pertinent progress notes, lab work and imaging with summary provided.  Evaluated by Dr. XuErlinda Hongith stroke work-up revealing small acute infarct involving the right precentral and postcentral gyri, embolic pattern most likely due to right ICA stenosis with high risk plaque s/p R CEA 4/20.  Recommended 30-day cardiac event monitor outpatient to rule out A. fib.  Recommended DAPT for 3 weeks and aspirin alone as well as starting atorvastatin 40 mg daily.  LDL 93.  Questionable lupus on Plaquenil due to positive blood test in the past  with subsequent blood test negative - per note, patient reported rheumatologist does not believe she has lupus and their plan was to taper off Plaquenil.  Hypercoagulable work-up negative for resulted labs with some pending at discharge.  Evaluated by therapies and discharged  home in stable condition without therapy needs. ? ?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, emboli pattern, most likely due to right ICA stenosis with high risk plaque ?  ?MR brain small acute infarct involving the right precentral and postcentral gyri.  No hemorrhage or mass-effect.  Finding of chronic small vessel ischemia ?CTA head/neck approximately 50% stenosis of the proximal right ICA secondary to mixed density atherosclerosis ?LE venous Doppler neg ?Hypercoag labs are negative  ?Recommend 30 day cardiac event monitoring as outpt to rule out afib-requested from cards master ?LDL 93 ?A1c 5.5 ?No antithrombotic or anticoagulant prior to admission  Now on DAPT w/ASA '81mg'$  and Plavix 75 mg daily x 3 weeks then ASA '81mg'$  alone ?Therapy recommendations: Cleared without needs  ?Disposition:  Home  ? ? ? ? ? ? ? ?ROS:   ?14 system review of systems performed and negative with exception of no complaints ? ?PMH:  ?Past Medical History:  ?Diagnosis Date  ? Allergy Spring 2010  ? Cataract March 2021  ? Glaucoma Approximately 2005  ? Horseshoe retinal tear of left eye 04/02/2021  ? Hyperlipidemia LDL goal <70 01/11/2021  ? Hypertension 2022  ? Long-term use of Plaquenil 04/02/2021  ? Lupus ([place])   ? new rheum questioning original dx. continuing Plaquenil at lower dose for now.  ? 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  ? Positive TB test   ? Pseudophakia of both eyes 04/02/2021  ? Retinal detachment with multiple breaks, right eye 04/02/2021  ? Stroke [facility]) April 2022  ? Thyroid disease   ? ? ?PSH:  ?Past Surgical History:  ?Procedure Laterality Date  ? ENDARTERECTOMY Right 05/02/2020  ? Procedure: RIGHT CAROTID ENDARTERECTOMY;  Surgeon: Rosetta Posner, MD;  Location: [facility];  Service: Vascular;  Laterality: Right;  ? EYE SURGERY  01/2020  ? SCLERAL BUCKLE Right 2004  ? TUBAL LIGATION  Feb 1982  ? ? ?Social History:  ?Social History  ? ?Socioeconomic History  ? Marital status: Married  ?  Spouse name:  Fritz Jackson  ? Number of children: Not on file  ? Years of education: Not on file  ? Highest education level: Not on file  ?Occupational History  ? Occupation: retired  ?Tobacco Use  ? Smoking status: Never  ?  Passive exposure: Never  ? Smokeless tobacco: Never  ?Vaping Use  ? Vaping Use: Never used  ?Substance and Sexual Activity  ? Alcohol use: Never  ? Drug use: Never  ? Sexual activity: Not Currently  ?  Birth control/protection: Surgical  ?Other Topics Concern  ? Not on file  ?Social History Narrative  ? Marital status/children/pets: Married.  2 children.  ? Education/employment: Retired.  College-educated.  ? Safety:   ?   -smoke alarm in the home:Yes  ?   - wears seatbelt: Yes  ?   - Feels safe in their relationships: Yes  ?   ? ?Social Determinants of Health  ? ?Financial Resource Strain: Not on file  ?Food Insecurity: No Food Insecurity  ? Worried About Charity fundraiser in the Last Year: Never true  ? Ran Out of Food in the Last Year: Never true  ?Transportation Needs: No Transportation Needs  ? Lack of Transportation (  Medical): No  ? Lack of Transportation (Non-Medical): No  ?Physical Activity: Not on file  ?Stress: No Stress Concern Present  ? Feeling of Stress : Only a little  ?Social Connections: Moderately Integrated  ? Frequency of Communication with Friends and Family: More than three times a week  ? Frequency of Social Gatherings with Friends and Family: More than three times a week  ? Attends Religious Services: More than 4 times per year  ? Active Member of Clubs or Organizations: No  ? Attends Archivist Meetings: Never  ? Marital Status: Married  ?Intimate Partner Violence: Not At Risk  ? Fear of Current or Ex-Partner: No  ? Emotionally Abused: No  ? Physically Abused: No  ? Sexually Abused: No  ? ? ?Family History:  ?Family History  ?Problem Relation Age of Onset  ? ALS Mother   ? Early death Mother   ? Dementia Father   ? Hypertension Father   ? Macular degeneration Maternal Aunt   ?  Alcohol abuse Paternal Uncle   ? Alcohol abuse Paternal Uncle   ? ? ?Medications:   ?Current Outpatient Medications on File Prior to Visit  ?Medication Sig Dispense Refill  ? ARMOUR THYROID 30 MG tab

## 2021-06-03 ENCOUNTER — Ambulatory Visit (INDEPENDENT_AMBULATORY_CARE_PROVIDER_SITE_OTHER): Payer: Medicare Other | Admitting: Family Medicine

## 2021-06-03 ENCOUNTER — Encounter: Payer: Self-pay | Admitting: Family Medicine

## 2021-06-03 VITALS — BP 168/72 | HR 81 | Temp 98.4°F | Wt 86.6 lb

## 2021-06-03 DIAGNOSIS — E034 Atrophy of thyroid (acquired): Secondary | ICD-10-CM | POA: Diagnosis not present

## 2021-06-03 DIAGNOSIS — M329 Systemic lupus erythematosus, unspecified: Secondary | ICD-10-CM

## 2021-06-03 DIAGNOSIS — D6869 Other thrombophilia: Secondary | ICD-10-CM

## 2021-06-03 DIAGNOSIS — I10 Essential (primary) hypertension: Secondary | ICD-10-CM

## 2021-06-03 DIAGNOSIS — Z8673 Personal history of transient ischemic attack (TIA), and cerebral infarction without residual deficits: Secondary | ICD-10-CM | POA: Diagnosis not present

## 2021-06-03 DIAGNOSIS — E785 Hyperlipidemia, unspecified: Secondary | ICD-10-CM | POA: Diagnosis not present

## 2021-06-03 NOTE — Patient Instructions (Addendum)
Return in about 24 weeks (around 11/18/2021) for Routine chronic condition follow-up.        Great to see you today.  I have refilled the medication(s) we provide.   If labs were collected, we will inform you of lab results once received either by echart message or telephone call.   - echart message- for normal results that have been seen by the patient already.   - telephone call: abnormal results or if patient has not viewed results in their echart.

## 2021-06-03 NOTE — Progress Notes (Signed)
This visit occurred during the SARS-CoV-2 public health emergency.  Safety protocols were in place, including screening questions prior to the visit, additional usage of staff PPE, and extensive cleaning of exam room while observing appropriate contact time as indicated for disinfecting solutions.    Patient ID: Wendy Jackson, female  DOB: [birth year], 76 y.o.   MRN: [id number] Patient Care Team    Relationship Specialty Notifications Start End  [name], DO PCP - General Family Medicine  12/11/20   Frann Rider, NP Nurse Practitioner Neurology  05/23/20   Marlowe Sax, MD Referring Physician Ophthalmology  05/23/20   Dr. Gypsy Balsam Attending Physician Rheumatology  12/10/20    Comment: [place], [place]    Chief Complaint  Patient presents with   Hypertension    Subjective: Wendy Jackson is a 76 y.o.  Female  present for  Lupus: Patient reports she was diagnosed with lupus many years ago.  She was started on Plaquenil at her rheumatologist in [place].  Her original symptoms were some mild polyarthralgia.  She recently had to start seeing a new rheumatologist, in the same office, secondary to her original rheumatologist retiring.  Her new rheumatologist does not seem as confident in the diagnosis of lupus for her.  He has started to decrease her Plaquenil dose.  She plans to continue to follow-up with the rheumatologist in [place].   BMI less than 19,adult Patient reports she has always been rather small and has not weighed more than 100 pounds the majority of her life.   HTN/HLD/History of CVA (cerebrovascular accident)/acquired thrombophilia Pt reports compliance  with losartan 100 and verapmil 120 mg started last visit.. Patient denies chest pain, shortness of breath, dizziness or lower extremity edema.  Home pressures are in the 130's/60-80  Pt is taking a  daily baby ASA. Pt is  prescribed statin. Prior note: Patient reports April/2022 she suffered from a ischemic stroke.   She states the only symptom she had was paresthesia of her left hand.  She presented to the emergency room and was found to have an ischemic stroke secondary to right carotid artery stenosis.  She underwent right carotid endarterectomy.  She is established with neurology.  She has had rather significant high blood pressure recordings per EMR.  She states that her home blood pressures were usually in the high 130s when on amlodipine 10 mg daily.  However she had significant swelling in her lower extremities and amlodipine was stopped and losartan 25 mg was started.  Swelling has subsided, however her blood pressures at home are still running in the 623J systolic.  She is prescribed Crestor through her neurology team.  She is compliant with baby aspirin daily.  She has suffered from no long-term deficits from her stroke.   Hypothyroidism, unspecified type Patient reports her Armour Thyroid 30 has been stable for some time.  compliant with medication.     06/03/2021    1:41 PM 12/11/2020    1:46 PM 05/29/2020    6:18 PM 03/06/2020    3:56 PM  Depression screen PHQ 2/9  Decreased Interest 0 0 0 0  Down, Depressed, Hopeless 0 0 0 0  PHQ - 2 Score 0 0 0 0       View : No data to display.          Immunization History  Administered Date(s) Administered   PFIZER(Purple Top)SARS-COV-2 Vaccination 02/20/2019, 03/16/2019, 11/11/2019, 06/15/2020   Pfizer Covid-19 Vaccine Bivalent Booster 73yr & up 10/05/2020  Tdap 08/04/2019    Past Medical History:  Diagnosis Date   Allergy Spring 2010   Cataract March 2021   Glaucoma Approximately 2005   Horseshoe retinal tear of left eye 04/02/2021   Hyperlipidemia LDL goal <70 01/11/2021   Hypertension 2022   Long-term use of Plaquenil 04/02/2021   Lupus ([place])    new rheum questioning original dx. continuing Plaquenil at lower dose for now.   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   Positive TB test    Pseudophakia of both eyes 04/02/2021   Retinal  detachment with multiple breaks, right eye 04/02/2021   Stroke [facility]) April 2022   Thyroid disease    Allergies  Allergen Reactions   Amlodipine Other (See Comments)    Bilateral lower extremity edema   Augmentin [Amoxicillin-Pot Clavulanate] Other (See Comments)    unk   Tramadol     Nausea    Flexeril [Cyclobenzaprine] Palpitations   Past Surgical History:  Procedure Laterality Date   ENDARTERECTOMY Right 05/02/2020   Procedure: RIGHT CAROTID ENDARTERECTOMY;  Surgeon: Rosetta Posner, MD;  Location: [facility] OR;  Service: Vascular;  Laterality: Right;   EYE SURGERY  01/2020   SCLERAL BUCKLE Right 2004   TUBAL LIGATION  Feb 1982   Family History  Problem Relation Age of Onset   ALS Mother    Early death Mother    Dementia Father    Hypertension Father    Macular degeneration Maternal Aunt    Alcohol abuse Paternal Uncle    Alcohol abuse Paternal Uncle    Social History   Social History Narrative   Marital status/children/pets: Married.  2 children.   Education/employment: Retired.  College-educated.   Safety:      -smoke alarm in the home:Yes     - wears seatbelt: Yes     - Feels safe in their relationships: Yes       Allergies as of 06/03/2021       Reactions   Amlodipine Other (See Comments)   Bilateral lower extremity edema   Augmentin [amoxicillin-pot Clavulanate] Other (See Comments)   unk   Tramadol    Nausea   Flexeril [cyclobenzaprine] Palpitations        Medication List        Accurate as of Jun 03, 2021  2:01 PM. If you have any questions, ask your nurse or doctor.          Airborne [employer] 1 tablet by mouth in the morning and at bedtime.   Armour Thyroid 30 MG tablet Generic drug: thyroid Take 1 tablet (30 mg total) by mouth daily.   aspirin EC 81 MG tablet Take 1 tablet (81 mg total) by mouth daily. Swallow whole.   cetirizine 10 MG chewable tablet Commonly known as: ZYRTEC Chew 10 mg by mouth daily.    hydroxychloroquine 200 MG tablet Commonly known as: PLAQUENIL Take 200 mg by mouth daily.   L-Carnitine 500 MG Caps Take 500 mg by mouth daily.   losartan 100 MG tablet Commonly known as: COZAAR Take 1 tablet (100 mg total) by mouth daily.   Lumigan 0.01 % Soln Generic drug: bimatoprost Place 1 drop into the left eye at bedtime.   Resveratrol 50 MG Caps Take 50 mg by mouth daily.   rosuvastatin 40 MG tablet Commonly known as: Crestor Take 1 tablet (40 mg total) by mouth daily.   Simbrinza 1-0.2 % Susp Generic drug: Brinzolamide-Brimonidine Place 1 drop into the left eye in the  morning, at noon, and at bedtime.   Ubiquinol 100 MG Caps Take 100 mg by mouth.   UP4 PROBIOTICS WOMENS PO Take 1 capsule by mouth daily.   verapamil 120 MG CR tablet Commonly known as: CALAN-SR Take 1 tablet (120 mg total) by mouth at bedtime.   Vitamin D-3 125 MCG (5000 UT) Tabs Take by mouth daily.        All past medical history, surgical history, allergies, family history, immunizations andmedications were updated in the EMR today and reviewed under the history and medication portions of their EMR.       ROS 14 pt review of systems performed and negative (unless mentioned in an HPI)  Objective: BP (!) 168/72   Pulse 81   Temp 98.4 F (36.9 C)   Wt 86 lb 9.6 oz (39.3 kg)   SpO2 99%   BMI 15.34 kg/m  Physical Exam Vitals and nursing note reviewed.  Constitutional:      General: She is not in acute distress.    Appearance: Normal appearance. She is not ill-appearing or toxic-appearing.     Comments: thin  HENT:     Head: Normocephalic and atraumatic.  Eyes:     General:        Right eye: No discharge.        Left eye: No discharge.     Extraocular Movements: Extraocular movements intact.     Conjunctiva/sclera: Conjunctivae normal.     Pupils: Pupils are equal, round, and reactive to light.  Cardiovascular:     Rate and Rhythm: Normal rate and regular rhythm.      Pulses: Normal pulses.     Heart sounds: Normal heart sounds. No murmur heard. Pulmonary:     Effort: Pulmonary effort is normal. No respiratory distress.     Breath sounds: Normal breath sounds. No wheezing, rhonchi or rales.  Musculoskeletal:     Cervical back: No tenderness.     Right lower leg: No edema.     Left lower leg: No edema.  Skin:    General: Skin is warm.     Findings: No rash.  Neurological:     General: No focal deficit present.     Mental Status: She is alert and oriented to person, place, and time. Mental status is at baseline.     Cranial Nerves: No cranial nerve deficit.     Sensory: No sensory deficit.     Motor: No weakness.     Coordination: Coordination normal.     Gait: Gait normal.     Deep Tendon Reflexes: Reflexes normal.  Psychiatric:        Mood and Affect: Mood normal.        Behavior: Behavior normal.        Thought Content: Thought content normal.        Judgment: Judgment normal.     No results found.  Assessment/plan: Wendy Jackson is a 76 y.o. female present for CPE/CMC Hypertension/hyperlipidemia/history of CVA (cerebrovascular accident)/acquired thrombophilia Home pressures are normal now (130/60s)- she is never normal here.  Continue  losartan to 100 mg with close follow-up.   Continue verapamil 120 mg qhs.  ? Lupus playing a role.  Although it sounds like they are not certain this was an accurate diagnosis for her new rheumatologist. Continue ASA 81 Her home cuff is accurate in comparison today to our cuff readings  Low-sodium diet. continue Crestor 40 mg daily prescribed by her neurology team. Labs UTD She is  established with neurology Consider referral to cardiology if unable to get blood pressure under better control. Follow-up 5.5 mos.    Hypothyroidism, unspecified type Has been stable Continue  Armour Thyroid 30 mg daily Thyroid panel collected today - TSH - T4, free  Lupus (HCC)/Long-term use of  Plaquenil [place] history per pt and new rheum.   Return in about 24 weeks (around 11/18/2021) for Routine chronic condition follow-up.  No orders of the defined types were placed in this encounter.   No orders of the defined types were placed in this encounter.  No orders of the defined types were placed in this encounter.  Referral Orders  No referral(s) requested today     Electronically signed by: Howard Pouch, [place]

## 2021-06-12 ENCOUNTER — Ambulatory Visit (INDEPENDENT_AMBULATORY_CARE_PROVIDER_SITE_OTHER): Payer: Medicare Other

## 2021-06-12 DIAGNOSIS — Z0000 Encounter for general adult medical examination without abnormal findings: Secondary | ICD-10-CM

## 2021-06-12 NOTE — Patient Instructions (Signed)
Wendy Jackson , Thank you for taking time to come for your Medicare Wellness Visit. I appreciate your ongoing commitment to your health goals. Please review the following plan we discussed and let me know if I can assist you in the future.   Screening recommendations/referrals: Colonoscopy: pt has cologuard  Mammogram: declined at this time  Bone Density: declined at this time  Recommended yearly ophthalmology/optometry visit for glaucoma screening and checkup Recommended yearly dental visit for hygiene and checkup  Vaccinations: Influenza vaccine: declined and discussed  Pneumococcal vaccine: declined  Tdap vaccine: done 08/04/19 repeat ever 10 years  Shingles vaccine: Shingrix discussed. Please contact your pharmacy for coverage information.    Covid-19:Completed 2/7, 3/3, 11/11/19 & 6/3, 10/05/20  Advanced directives: Advance directive discussed with you today. I have provided a copy for you to complete at home and have notarized. Once this is complete please bring a copy in to our office so we can scan it into your chart.  Conditions/risks identified: Stay healthy   Next appointment: Follow up in one year for your annual wellness visit    Preventive Care 65 Years and Older, Female Preventive care refers to lifestyle choices and visits with your health care provider that can promote health and wellness. What does preventive care include? A yearly physical exam. This is also called an annual well check. Dental exams once or twice a year. Routine eye exams. Ask your health care provider how often you should have your eyes checked. Personal lifestyle choices, including: Daily care of your teeth and gums. Regular physical activity. Eating a healthy diet. Avoiding tobacco and drug use. Limiting alcohol use. Practicing safe sex. Taking low-dose aspirin every day. Taking vitamin and mineral supplements as recommended by your health care provider. What happens during an annual well  check? The services and screenings done by your health care provider during your annual well check will depend on your age, overall health, lifestyle risk factors, and family history of disease. Counseling  Your health care provider may ask you questions about your: Alcohol use. Tobacco use. Drug use. Emotional well-being. Home and relationship well-being. Sexual activity. Eating habits. History of falls. Memory and ability to understand (cognition). Work and work Statistician. Reproductive health. Screening  You may have the following tests or measurements: Height, weight, and BMI. Blood pressure. Lipid and cholesterol levels. These may be checked every 5 years, or more frequently if you are over 26 years old. Skin check. Lung cancer screening. You may have this screening every year starting at age 66 if you have a 30-pack-year history of smoking and currently smoke or have quit within the past 15 years. Fecal occult blood test (FOBT) of the stool. You may have this test every year starting at age 60. Flexible sigmoidoscopy or colonoscopy. You may have a sigmoidoscopy every 5 years or a colonoscopy every 10 years starting at age 37. Hepatitis C blood test. Hepatitis B blood test. Sexually transmitted disease (STD) testing. Diabetes screening. This is done by checking your blood sugar (glucose) after you have not eaten for a while (fasting). You may have this done every 1-3 years. Bone density scan. This is done to screen for osteoporosis. You may have this done starting at age 47. Mammogram. This may be done every 1-2 years. Talk to your health care provider about how often you should have regular mammograms. Talk with your health care provider about your test results, treatment options, and if necessary, the need for more tests. Vaccines  Your  health care provider may recommend certain vaccines, such as: Influenza vaccine. This is recommended every year. Tetanus, diphtheria, and  acellular pertussis (Tdap, Td) vaccine. You may need a Td booster every 10 years. Zoster vaccine. You may need this after age 43. Pneumococcal 13-valent conjugate (PCV13) vaccine. One dose is recommended after age 44. Pneumococcal polysaccharide (PPSV23) vaccine. One dose is recommended after age 52. Talk to your health care provider about which screenings and vaccines you need and how often you need them. This information is not intended to replace advice given to you by your health care provider. Make sure you discuss any questions you have with your health care provider. Document Released: 01/26/2015 Document Revised: 09/19/2015 Document Reviewed: 10/31/2014 Elsevier Interactive Patient Education  2017 [place] Prevention in the Home Falls can cause injuries. They can happen to people of all ages. There are many things you can do to make your home safe and to help prevent falls. What can I do on the outside of my home? Regularly fix the edges of walkways and driveways and fix any cracks. Remove anything that might make you trip as you walk through a door, such as a raised step or threshold. Trim any bushes or trees on the path to your home. Use bright outdoor lighting. Clear any walking paths of anything that might make someone trip, such as rocks or tools. Regularly check to see if handrails are loose or broken. Make sure that both sides of any steps have handrails. Any raised decks and porches should have guardrails on the edges. Have any leaves, snow, or ice cleared regularly. Use sand or salt on walking paths during winter. Clean up any spills in your garage right away. This includes oil or grease spills. What can I do in the bathroom? Use night lights. Install grab bars by the toilet and in the tub and shower. Do not use towel bars as grab bars. Use non-skid mats or decals in the tub or shower. If you need to sit down in the shower, use a plastic, non-slip stool. Keep  the floor dry. Clean up any water that spills on the floor as soon as it happens. Remove soap buildup in the tub or shower regularly. Attach bath mats securely with double-sided non-slip rug tape. Do not have throw rugs and other things on the floor that can make you trip. What can I do in the bedroom? Use night lights. Make sure that you have a light by your bed that is easy to reach. Do not use any sheets or blankets that are too big for your bed. They should not hang down onto the floor. Have a firm chair that has side arms. You can use this for support while you get dressed. Do not have throw rugs and other things on the floor that can make you trip. What can I do in the kitchen? Clean up any spills right away. Avoid walking on wet floors. Keep items that you use a lot in easy-to-reach places. If you need to reach something above you, use a strong step stool that has a grab bar. Keep electrical cords out of the way. Do not use floor polish or wax that makes floors slippery. If you must use wax, use non-skid floor wax. Do not have throw rugs and other things on the floor that can make you trip. What can I do with my stairs? Do not leave any items on the stairs. Make sure that there are handrails  on both sides of the stairs and use them. Fix handrails that are broken or loose. Make sure that handrails are as long as the stairways. Check any carpeting to make sure that it is firmly attached to the stairs. Fix any carpet that is loose or worn. Avoid having throw rugs at the top or bottom of the stairs. If you do have throw rugs, attach them to the floor with carpet tape. Make sure that you have a light switch at the top of the stairs and the bottom of the stairs. If you do not have them, ask someone to add them for you. What else can I do to help prevent falls? Wear shoes that: Do not have high heels. Have rubber bottoms. Are comfortable and fit you well. Are closed at the toe. Do not  wear sandals. If you use a stepladder: Make sure that it is fully opened. Do not climb a closed stepladder. Make sure that both sides of the stepladder are locked into place. Ask someone to hold it for you, if possible. Clearly mark and make sure that you can see: Any grab bars or handrails. First and last steps. Where the edge of each step is. Use tools that help you move around (mobility aids) if they are needed. These include: Canes. Walkers. Scooters. Crutches. Turn on the lights when you go into a dark area. Replace any light bulbs as soon as they burn out. Set up your furniture so you have a clear path. Avoid moving your furniture around. If any of your floors are uneven, fix them. If there are any pets around you, be aware of where they are. Review your medicines with your doctor. Some medicines can make you feel dizzy. This can increase your chance of falling. Ask your doctor what other things that you can do to help prevent falls. This information is not intended to replace advice given to you by your health care provider. Make sure you discuss any questions you have with your health care provider. Document Released: 10/26/2008 Document Revised: 06/07/2015 Document Reviewed: 02/03/2014 Elsevier Interactive Patient Education  2017 [employer].

## 2021-06-12 NOTE — Progress Notes (Addendum)
Virtual Visit via Telephone Note  I connected with  Lanney Gins on 06/14/21 at  2:30 PM EDT by telephone and verified that I am speaking with the correct person using two identifiers.  Medicare Annual Wellness visit completed telephonically due to Covid-19 pandemic.   Persons participating in this call: This Health Coach and this patient.   Location: Patient: home Provider: office   I discussed the limitations, risks, security and privacy concerns of performing an evaluation and management service by telephone and the availability of in person appointments. The patient expressed understanding and agreed to proceed.  Unable to perform video visit due to video visit attempted and failed and/or patient does not have video capability.   Some vital signs may be absent or patient reported.   Willette Brace, LPN   Subjective:   Lessa Huge is a 76 y.o. female who presents for Medicare Annual (Subsequent) preventive examination.  Review of Systems     Cardiac Risk Factors include: advanced age (>7mn, >>77women)     Objective:    There were no vitals filed for this visit. There is no height or weight on file to calculate BMI.     06/12/2021    1:58 PM 05/23/2020    1:10 PM 05/01/2020   12:00 PM 04/30/2020    6:22 PM 03/06/2020    3:52 PM  Advanced Directives  Does Patient Have a Medical Advance Directive? No No No No No  Does patient want to make changes to medical advance directive?     Yes (MAU/Ambulatory/Procedural Areas - Information given)  Would patient like information on creating a medical advance directive? Yes (MAU/Ambulatory/Procedural Areas - Information given) No - Patient declined No - Patient declined No - Patient declined     Current Medications (verified) Outpatient Encounter Medications as of 06/12/2021  Medication Sig   ARMOUR THYROID 30 MG tablet Take 1 tablet (30 mg total) by mouth daily.   aspirin EC 81 MG tablet Take 1 tablet (81 mg total) by mouth  daily. Swallow whole.   cetirizine (ZYRTEC) 10 MG chewable tablet Chew 10 mg by mouth daily.   Cholecalciferol (VITAMIN D-3) 125 MCG (5000 UT) TABS Take by mouth daily.   hydroxychloroquine (PLAQUENIL) 200 MG tablet Take 200 mg by mouth daily.   losartan (COZAAR) 100 MG tablet Take 1 tablet (100 mg total) by mouth daily.   LUMIGAN 0.01 % SOLN Place 1 drop into the left eye at bedtime.   Misc Natural Products (AIRBORNE ELDERBERRY) CHEW Chew 1 tablet by mouth in the morning and at bedtime.   Probiotic Product (UP4 PROBIOTICS WOMENS PO) Take 1 capsule by mouth daily.   Resveratrol 50 MG CAPS Take 50 mg by mouth daily.   rosuvastatin (CRESTOR) 40 MG tablet Take 1 tablet (40 mg total) by mouth daily.   SIMBRINZA 1-0.2 % SUSP Place 1 drop into the left eye in the morning, at noon, and at bedtime.   Ubiquinol 100 MG CAPS Take 100 mg by mouth.   verapamil (CALAN-SR) 120 MG CR tablet Take 1 tablet (120 mg total) by mouth at bedtime.   levOCARNitine L-Tartrate (L-CARNITINE) 500 MG CAPS Take 500 mg by mouth daily. (Patient not taking: Reported on 06/12/2021)   No facility-administered encounter medications on file as of 06/12/2021.    Allergies (verified) Amlodipine, Augmentin [amoxicillin-pot clavulanate], Tramadol, and Flexeril [cyclobenzaprine]   History: Past Medical History:  Diagnosis Date   Allergy Spring 2010   Cataract March 2021   Glaucoma  Approximately 2005   [place] retinal tear of left eye 04/02/2021   Hyperlipidemia LDL goal <70 01/11/2021   Hypertension 2022   Long-term use of Plaquenil 04/02/2021   Lupus ([place])    new rheum questioning original dx. continuing Plaquenil at lower dose for now.   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   Positive TB test    Pseudophakia of both eyes 04/02/2021   Retinal detachment with multiple breaks, right eye 04/02/2021   Stroke [facility]) April 2022   Thyroid disease    Past Surgical History:  Procedure Laterality Date   ENDARTERECTOMY Right  05/02/2020   Procedure: RIGHT CAROTID ENDARTERECTOMY;  Surgeon: Rosetta Posner, MD;  Location: [facility] OR;  Service: Vascular;  Laterality: Right;   EYE SURGERY  01/2020   SCLERAL BUCKLE Right 2004   TUBAL LIGATION  Feb 1982   Family History  Problem Relation Age of Onset   ALS Mother    Early death Mother    Dementia Father    Hypertension Father    Macular degeneration Maternal Aunt    Alcohol abuse Paternal Uncle    Alcohol abuse Paternal Uncle    Social History   Socioeconomic History   Marital status: Married    Spouse name: [name]   Number of children: Not on file   Years of education: Not on file   Highest education level: Some college, no degree  Occupational History   Occupation: retired  Tobacco Use   Smoking status: Never    Passive exposure: Never   Smokeless tobacco: Never  Vaping Use   Vaping Use: Never used  Substance and Sexual Activity   Alcohol use: Never   Drug use: Never   Sexual activity: Not Currently    Birth control/protection: Surgical  Other Topics Concern   Not on file  Social History Narrative   Marital status/children/pets: Married.  2 children.   Education/employment: Retired.  College-educated.   Safety:      -smoke alarm in the home:Yes     - wears seatbelt: Yes     - Feels safe in their relationships: Yes      Social Determinants of Health   Financial Resource Strain: Low Risk    Difficulty of Paying Living Expenses: Not hard at all  Food Insecurity: No Food Insecurity   Worried About Charity fundraiser in the Last Year: Never true   Ran Out of Food in the Last Year: Never true  Transportation Needs: No Transportation Needs   Lack of Transportation (Medical): No   Lack of Transportation (Non-Medical): No  Physical Activity: Insufficiently Active   Days of Exercise per Week: 4 days   Minutes of Exercise per Session: 30 min  Stress: No Stress Concern Present   Feeling of Stress : Not at all  Social Connections: Moderately  Isolated   Frequency of Communication with Friends and Family: Twice a week   Frequency of Social Gatherings with Friends and Family: Never   Attends Religious Services: 1 to 4 times per year   Active Member of [employer] or Organizations: No   Attends Music therapist: Never   Marital Status: Married    Tobacco Counseling Counseling given: Not Answered   Clinical Intake:  Pre-visit preparation completed: Yes  Pain : No/denies pain     BMI - recorded: 15.58 Nutritional Status: BMI <19  Underweight Nutritional Risks: None Diabetes: No  How often do you need to have someone help you when you read instructions, pamphlets,  or other written materials from your doctor or pharmacy?: 1 - Never  Diabetic?no  Interpreter Needed?: No  Information entered by :: Charlott Rakes, LPN   Activities of Daily Living    06/12/2021    2:00 PM 06/03/2021    9:39 AM  In your present state of health, do you have any difficulty performing the following activities:  Hearing? 0 0  Vision? 0 1  Difficulty concentrating or making decisions? 0 1  Walking or climbing stairs? 0 0  Dressing or bathing? 0 0  Doing errands, shopping? 0 0  Preparing Food and eating ? N N  Using the Toilet? N N  In the past six months, have you accidently leaked urine? N Y  Do you have problems with loss of bowel control? Y Y  Comment at times   Managing your Medications? N N  Managing your Finances? N N  Housekeeping or managing your Housekeeping? N N    Patient Care Team: [name], DO as PCP - General (Family Medicine) Frann Rider, NP as Nurse Practitioner (Neurology) Marlowe Sax, MD as Referring Physician (Ophthalmology) Dr. Gypsy Balsam as Attending Physician (Rheumatology)  Indicate any recent Medical Services you may have received from other than [facility] providers in the past year (date may be approximate).     Assessment:   This is a routine wellness examination for  [place].  Hearing/Vision screen Hearing Screening - Comments:: Pt denies any hearing issues  Vision Screening - Comments:: Pt follows up wit  Dr Francee Nodal  &amp; Dr Brigitte Pulse for annual ey exams   Dietary issues and exercise activities discussed: Current Exercise Habits: Home exercise routine, Type of exercise: walking, Time (Minutes): 30, Frequency (Times/Week): 4, Weekly Exercise (Minutes/Week): 120   Goals Addressed             This Visit's Progress    Patient Stated       Stay healthy       Depression Screen    06/12/2021    1:56 PM 06/03/2021    1:41 PM 12/11/2020    1:46 PM 05/29/2020    6:18 PM 03/06/2020    3:56 PM  PHQ 2/9 Scores  PHQ - 2 Score 0 0 0 0 0    Fall Risk    06/12/2021    2:00 PM 06/03/2021    1:41 PM 06/03/2021    9:39 AM 06/02/2021   11:16 PM 12/11/2020    1:46 PM  Fall Risk   Falls in the past year? '1 1 1 1 '$ 0  Number falls in past yr: 0 0 0 0 0  Injury with Fall? 0 0 0 0 0  Risk for fall due to : Impaired vision    No Fall Risks  Follow up Falls prevention discussed    Falls evaluation completed    FALL RISK PREVENTION PERTAINING TO THE HOME:  Any stairs in or around the home? Yes  If so, are there any without handrails? No  Home free of loose throw rugs in walkways, pet beds, electrical cords, etc? Yes  Adequate lighting in your home to reduce risk of falls? Yes   ASSISTIVE DEVICES UTILIZED TO PREVENT FALLS:  Life alert? No  Use of a cane, walker or w/c? No  Grab bars in the bathroom? No  Shower chair or bench in shower? Yes  Elevated toilet seat or a handicapped toilet? No   TIMED UP AND GO:  Was the test performed? No .   Cognitive  Function:        06/12/2021    2:02 PM  6CIT Screen  What Year? 0 points  What month? 0 points  What time? 0 points  Count back from 20 0 points  Months in reverse 0 points  Repeat phrase 0 points  Total Score 0 points    Immunizations Immunization History  Administered Date(s) Administered    PFIZER(Purple Top)SARS-COV-2 Vaccination 02/20/2019, 03/16/2019, 11/11/2019, 06/15/2020   Pfizer Covid-19 Vaccine Bivalent Booster 51yr & up 10/05/2020   Tdap 08/04/2019    TDAP status: Up to date  Flu Vaccine status: Declined, Education has been provided regarding the importance of this vaccine but patient still declined. Advised may receive this vaccine at local pharmacy or Health Dept. Aware to provide a copy of the vaccination record if obtained from local pharmacy or Health Dept. Verbalized acceptance and understanding.  Pneumococcal vaccine status: Declined,  Education has been provided regarding the importance of this vaccine but patient still declined. Advised may receive this vaccine at local pharmacy or Health Dept. Aware to provide a copy of the vaccination record if obtained from local pharmacy or Health Dept. Verbalized acceptance and understanding.   Covid-19 vaccine status: Completed vaccines  Qualifies for Shingles Vaccine? Yes   Zostavax completed No   Shingrix Completed?: No.    Education has been provided regarding the importance of this vaccine. Patient has been advised to call insurance company to determine out of pocket expense if they have not yet received this vaccine. Advised may also receive vaccine at local pharmacy or Health Dept. Verbalized acceptance and understanding.  Screening Tests Health Maintenance  Topic Date Due   MAMMOGRAM  Never done   DEXA SCAN  Never done   Fecal DNA (Cologuard)  01/13/2022 (Originally 12/26/1991)   Zoster Vaccines- Shingrix (1 of 2) 01/13/2022 (Originally 12/25/1964)   Pneumonia Vaccine 75 Years old (1 - PCV) 05/07/2022 (Originally 12/26/2011)   INFLUENZA VACCINE  08/13/2021   TETANUS/TDAP  08/03/2029   COVID-19 Vaccine  Completed   Hepatitis C Screening  Completed   HPV VACCINES  Aged Out    Health Maintenance  Health Maintenance Due  Topic Date Due   MAMMOGRAM  Never done   DEXA SCAN  Never done    Cologuard  postponed   Mammogram status: Ordered 05/06/21 pt declined appt at this time . Pt provided with contact info and advised to call to schedule appt.   Bone Density status: Ordered 05/06/21 per pt declined at this time . Pt provided with contact info and advised to call to schedule appt.   Additional Screening:  Hepatitis C Screening: Completed 05/06/21  Vision Screening: Recommended annual ophthalmology exams for early detection of glaucoma and other disorders of the eye. Is the patient up to date with their annual eye exam?  Yes  Who is the provider or what is the name of the office in which the patient attends annual eye exams? Dr TFrancee Nodal If pt is not established with a provider, would they like to be referred to a provider to establish care? No .   Dental Screening: Recommended annual dental exams for proper oral hygiene  Community Resource Referral / Chronic Care Management: CRR required this visit?  No   CCM required this visit?  No      Plan:     I have personally reviewed and noted the following in the patient's chart:   Medical and social history Use of alcohol, tobacco or illicit drugs  Current medications and supplements including opioid prescriptions.  Functional ability and status Nutritional status Physical activity Advanced directives List of other physicians Hospitalizations, surgeries, and ER visits in previous 12 months Vitals Screenings to include cognitive, depression, and falls Referrals and appointments  In addition, I have reviewed and discussed with patient certain preventive protocols, quality metrics, and best practice recommendations. A written personalized care plan for preventive services as well as general preventive health recommendations were provided to patient.     Willette Brace, LPN   4/0/1027   Nurse Notes: None

## 2021-07-09 ENCOUNTER — Telehealth: Payer: Self-pay

## 2021-07-11 ENCOUNTER — Telehealth: Payer: Self-pay

## 2021-07-11 NOTE — Telephone Encounter (Signed)
Spoke with pt regarding labs and instructions.   

## 2021-07-11 NOTE — Telephone Encounter (Signed)
Patient calling back for the 2nd time regarding prescription she is trying to get filled. Patient verified with [facility], they have a different dosage for patient for Losartan.  Please call pharmacy to verify correct prescription and dosage.

## 2021-07-11 NOTE — Telephone Encounter (Signed)
Noted  

## 2021-07-11 NOTE — Telephone Encounter (Signed)
Spoke with pt and informed her a 6 mo supply was sent to [facility] on 05/06/21

## 2021-07-11 NOTE — Telephone Encounter (Signed)
Patient refill request.   [facility] - [place]  losartan (COZAAR) 100 MG tablet [[id number]]

## 2021-07-11 NOTE — Telephone Encounter (Signed)
Patient returned call about her mammogram.  Patient states she will make appt to have completed.

## 2021-07-12 NOTE — Telephone Encounter (Signed)
Called pharmacy and gave script v/o from script on 05/06/21. Pharmacy will contact pt with script when ready.

## 2021-07-18 DIAGNOSIS — Z79899 Other long term (current) drug therapy: Secondary | ICD-10-CM | POA: Diagnosis not present

## 2021-07-18 DIAGNOSIS — H401133 Primary open-angle glaucoma, bilateral, severe stage: Secondary | ICD-10-CM | POA: Diagnosis not present

## 2021-07-18 DIAGNOSIS — Z961 Presence of intraocular lens: Secondary | ICD-10-CM | POA: Diagnosis not present

## 2021-07-18 DIAGNOSIS — H33021 Retinal detachment with multiple breaks, right eye: Secondary | ICD-10-CM | POA: Diagnosis not present

## 2021-09-17 DIAGNOSIS — M8589 Other specified disorders of bone density and structure, multiple sites: Secondary | ICD-10-CM | POA: Diagnosis not present

## 2021-09-17 DIAGNOSIS — M353 Polymyalgia rheumatica: Secondary | ICD-10-CM | POA: Diagnosis not present

## 2021-09-17 DIAGNOSIS — R768 Other specified abnormal immunological findings in serum: Secondary | ICD-10-CM | POA: Diagnosis not present

## 2021-10-17 ENCOUNTER — Telehealth: Payer: Self-pay

## 2021-10-17 ENCOUNTER — Telehealth: Payer: Self-pay | Admitting: Adult Health

## 2021-10-17 MED ORDER — ROSUVASTATIN CALCIUM 40 MG PO TABS: 40.0000 mg | ORAL_TABLET | Freq: Every day | ORAL | 0 refills | Status: DC

## 2021-10-17 NOTE — Addendum Note (Signed)
Addended by: Beatrix Fetters on: 10/17/2021 01:50 PM   Modules accepted: Orders

## 2021-10-17 NOTE — Telephone Encounter (Addendum)
Contacted pt back, informed her per last visit Jessica recommended Continue aspirin 81 mg daily and Crestor 40 mg daily for secondary stroke prevention.  Request medications filled by PCP as these are recommended lifelong use. Advised to contact PCP to rq refill. Pt stated she will and advised to give [place] a call back with any issues. She was appreciative.

## 2021-10-17 NOTE — Telephone Encounter (Signed)
Patient scheduled appt for 11/8 and she needs a refill called into [facility] in [place] for the rosuvastatin/crestor. Can you call in today patient is on vacation next week. Thank you

## 2021-10-17 NOTE — Telephone Encounter (Signed)
Rx sent for 90 d/s 

## 2021-10-17 NOTE — Telephone Encounter (Signed)
error 

## 2021-10-17 NOTE — Telephone Encounter (Signed)
Ok to fill for her

## 2021-10-17 NOTE — Telephone Encounter (Signed)
Patient was told by neurologist that PCP would have to start managing medication below.  Patient needs refill before this weekend.  She is leaving to go out of town next week.  [facility] [place]  rosuvastatin (CRESTOR) 40 MG tablet [[id number]]

## 2021-10-21 ENCOUNTER — Telehealth: Payer: Self-pay

## 2021-10-21 NOTE — Telephone Encounter (Signed)
Pt advised that Rx should still be a full tablet ('100mg'$ )   [place] RECORD [facility] Patient Name: Wendy Jackson BB Gender: Female DOB: 1945-06-07 Age: 76 Y 9 M 24 D Return Phone Number: [phone] (Primary), [phone] (Secondary) Address: City/ State/ Zip: [place]  [address] Client [place] Night - Client Client Site [place] [facility] Provider Raoul Pitch, [place] Type Call Who Is Calling Patient / Member / Family / Caregiver Call Type Triage / Clinical Relationship To Patient Self Return Phone Number [phone] (Primary) Chief Complaint Prescription Refill or Medication Request (non symptomatic) Reason for Call Medication Question / Request Initial Comment Caller states the dosage is different on her medication than normal. Usually it is an whole tablet, now it is half. Losartan 100 mg is the medication. Translation No Nurse Assessment Nurse: Jearld Pies, RN, Lovena Le Date/Time Eilene Ghazi Time): 10/19/2021 10:34:40 AM Please select the assessment type ---Pharmacy clarification Additional Documentation ---Caller states the dosage is different on her medication than normal. Usually it is an whole tablet, now it is half. Losartan 100 mg is the medication. Denies any new or worsening symptoms at this time. Is there an on-call physician for the client? ---Yes Do the client directives allow paging the on call for medication concerns? ---No Additional Documentation ---Advised pt "Please contact pharmacy at this time for clarification regarding dosage of medication. Call back 24/7 for triage of any new or worsening symptoms." Pt verbalizes understanding. Disp. Time Eilene Ghazi Time) Disposition Final User 10/19/2021 10:24:48 AM Send To RN Personal Delman Cheadle 10/19/2021 10:38:13 AM Clinical Call Yes Jearld Pies, RN, Lovena Le Final Disposition 10/19/2021 10:38:13 AM Clinical  Call Yes Jearld Pies, RN, Lovena Le

## 2021-10-29 ENCOUNTER — Encounter: Payer: Self-pay | Admitting: Family Medicine

## 2021-10-29 ENCOUNTER — Ambulatory Visit (INDEPENDENT_AMBULATORY_CARE_PROVIDER_SITE_OTHER): Payer: Medicare Other | Admitting: Family Medicine

## 2021-10-29 VITALS — BP 196/71 | HR 80 | Temp 98.0°F | Wt 83.8 lb

## 2021-10-29 DIAGNOSIS — N8189 Other female genital prolapse: Secondary | ICD-10-CM | POA: Diagnosis not present

## 2021-10-29 DIAGNOSIS — N368 Other specified disorders of urethra: Secondary | ICD-10-CM | POA: Diagnosis not present

## 2021-10-29 NOTE — Patient Instructions (Signed)
Pelvic Organ Prolapse Pelvic organ prolapse is a condition in women that involves the stretching, bulging, or dropping of pelvic organs into an abnormal position, past the opening of the vagina. It happens when the muscles and tissues that surround and support pelvic structures become weak or stretched. Pelvic organ prolapse can involve the: Vagina (vaginal prolapse). Uterus (uterine prolapse). Bladder (cystocele). Rectum (rectocele). Intestines (enterocele). When organs other than the vagina are involved, they often bulge into the vagina or protrude from the vagina, depending on how severe the prolapse is. What are the causes? This condition may be caused by: Pregnancy, labor, and childbirth. Past pelvic surgery. Lower levels of the hormone estrogen due to menopause. Consistently lifting more than 50 lb (23 kg). Obesity. Long-term difficulty passing stool (chronic constipation). Long-term, or chronic, cough. Fluid buildup in the abdomen due to certain conditions. What are the signs or symptoms? Symptoms of this condition include: Leaking a little urine (loss of bladder control) when you cough, sneeze, strain, and exercise (stress incontinence). This may be worse immediately after childbirth. It may gradually improve over time. Feeling pressure in your pelvis or vagina. This pressure may increase when you cough or when you are passing stool. A bulge that protrudes from the opening of your vagina. Difficulty passing urine or stool. Pain in your lower back. Pain or discomfort during sex, or decreased interest in sex. Repeated bladder infections (urinary tract infections). Difficulty inserting a tampon. In some people, this condition causes no symptoms. How is this diagnosed? This condition may be diagnosed based on a vaginal and rectal exam. During the exam, you may be asked to cough and strain while you are lying down, sitting, and standing up. Your health care provider will determine if  other tests are required, such as bladder function tests. How is this treated? Treatment for this condition may depend on your symptoms. Treatment may include: Lifestyle changes, such as drinking plenty of fluids and eating foods that are high in fiber. Emptying your bladder at scheduled times (bladder training therapy). This can help reduce or avoid urinary incontinence. Estrogen. This may help mild prolapse by increasing the strength and tone of pelvic floor muscles. Kegel exercises. These may help mild cases of prolapse by strengthening and tightening the muscles of the pelvic floor. A soft, flexible device that helps support the vaginal walls and keep pelvic organs in place (pessary). This is inserted into your vagina by your health care provider. Surgery. This is often the only form of treatment for severe prolapse. Follow these instructions at home: Eating and drinking Avoid drinking beverages that contain caffeine or alcohol. Increase your intake of high-fiber foods to decrease constipation and straining during bowel movements. Activity Lose weight if recommended by your health care provider. Avoid heavy lifting and straining with exercise and work. Do not hold your breath when you perform mild to moderate lifting and exercise activities. Limit your activities as directed by your health care provider. Do Kegel exercises as directed by your health care provider. To do this: Squeeze your pelvic floor muscles tight. You should feel a tight lift in your rectal area and a tightness in your vaginal area. Keep your stomach, buttocks, and legs relaxed. Hold the muscles tight for up to 10 seconds. Then relax your muscles. Repeat this exercise 50 times a day, or as much as told by your health care provider. Continue to do this exercise for at least 4-6 weeks, or for as long as told by your health care provider.   General instructions Take over-the-counter and prescription medicines only as told by  your health care provider. Wear a sanitary pad or adult diapers if you have urinary incontinence. If you have a pessary, take care of it as told by your health care provider. Keep all follow-up visits. This is important. Contact a health care provider if you: Have symptoms that interfere with your daily activities or sex life. Need medicine to help with the discomfort. Notice bleeding from your vagina that is not related to your menstrual period. Have a fever. Have pain or bleeding when you urinate. Have bleeding when you pass stool. Pass urine when you have sex. Have chronic constipation. Have a pessary that falls out. Have a foul-smelling vaginal discharge. Have an unusual, low pain in your abdomen. Get help right away if you: Cannot pass urine. Summary Pelvic organ prolapse is the stretching, bulging, or dropping of pelvic organs into an abnormal position. It happens when the muscles and tissues that surround and support pelvic structures become weak or stretched. When organs other than the vagina are involved, they often bulge into the vagina or protrude from it, depending on how severe the prolapse is. In most cases, this condition needs to be treated only if it produces symptoms. Treatment may include lifestyle changes, estrogen, Kegel exercises, pessary insertion, or surgery. Avoid heavy lifting and straining with exercise and work. Do not hold your breath when you perform mild to moderate lifting and exercise activities. Limit your activities as directed by your health care provider. This information is not intended to replace advice given to you by your health care provider. Make sure you discuss any questions you have with your health care provider. Document Revised: 06/27/2019 Document Reviewed: 06/27/2019 Elsevier Patient Education  2023 Elsevier Inc.  

## 2021-10-29 NOTE — Progress Notes (Signed)
Wendy Jackson , [birth year], 76 y.o., female MRN: [id number] Patient Care Team    Relationship Specialty Notifications Start End  [name], DO PCP - General Family Medicine  12/11/20   Frann Rider, NP Nurse Practitioner Neurology  05/23/20   Marlowe Sax, MD Referring Physician Ophthalmology  05/23/20   Dr. Gypsy Balsam Attending Physician Rheumatology  12/10/20    Comment: [place], [place]    Chief Complaint  Patient presents with   Groin Swelling    No itching or burning/ noticed about a month ago     Subjective: Pt presents for an OV with complaints of "groin swelling"  that started about 1-2 months ago. She feels the swelling is worse in the evening. She denies pain, dysuria, itching or vaginal discharge. She denies vaginal bleeding. She reports visualizing something whitish at the vaginal opening.      06/12/2021    1:56 PM 06/03/2021    1:41 PM 12/11/2020    1:46 PM 05/29/2020    6:18 PM 03/06/2020    3:56 PM  Depression screen PHQ 2/9  Decreased Interest 0 0 0 0 0  Down, Depressed, Hopeless 0 0 0 0 0  PHQ - 2 Score 0 0 0 0 0    Allergies  Allergen Reactions   Amlodipine Other (See Comments)    Bilateral lower extremity edema   Augmentin [Amoxicillin-Pot Clavulanate] Other (See Comments)    unk   Tramadol     Nausea    Flexeril [Cyclobenzaprine] Palpitations   Social History   Social History Narrative   Marital status/children/pets: Married.  2 children.   Education/employment: Retired.  College-educated.   Safety:      -smoke alarm in the home:Yes     - wears seatbelt: Yes     - Feels safe in their relationships: Yes      Past Medical History:  Diagnosis Date   Allergy Spring 2010   Cataract March 2021   Glaucoma Approximately 2005   Horseshoe retinal tear of left eye 04/02/2021   Hyperlipidemia LDL goal <70 01/11/2021   Hypertension 2022   Long-term use of Plaquenil 04/02/2021   Lupus ([place])    new rheum questioning original dx.  continuing Plaquenil at lower dose for now.   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   Positive TB test    Pseudophakia of both eyes 04/02/2021   Retinal detachment with multiple breaks, right eye 04/02/2021   Stroke [facility]) April 2022   Thyroid disease    Past Surgical History:  Procedure Laterality Date   ENDARTERECTOMY Right 05/02/2020   Procedure: RIGHT CAROTID ENDARTERECTOMY;  Surgeon: Rosetta Posner, MD;  Location: [facility] OR;  Service: Vascular;  Laterality: Right;   EYE SURGERY  01/2020   SCLERAL BUCKLE Right 2004   TUBAL LIGATION  Feb 1982   Family History  Problem Relation Age of Onset   ALS Mother    Early death Mother    Dementia Father    Hypertension Father    Macular degeneration Maternal Aunt    Alcohol abuse Paternal Uncle    Alcohol abuse Paternal Uncle    Allergies as of 10/29/2021       Reactions   Amlodipine Other (See Comments)   Bilateral lower extremity edema   Augmentin [amoxicillin-pot Clavulanate] Other (See Comments)   unk   Tramadol    Nausea   Flexeril [cyclobenzaprine] Palpitations        Medication List  Accurate as of October 29, 2021 10:17 AM. If you have any questions, ask your nurse or doctor.          STOP taking these medications    L-Carnitine 500 MG Caps Stopped by: Howard Pouch, DO       TAKE these medications    Airborne Elderberry Chew Chew 1 tablet by mouth in the morning and at bedtime.   Armour Thyroid 30 MG tablet Generic drug: thyroid Take 1 tablet (30 mg total) by mouth daily.   aspirin EC 81 MG tablet Take 1 tablet (81 mg total) by mouth daily. Swallow whole.   cetirizine 10 MG chewable tablet Commonly known as: ZYRTEC Chew 10 mg by mouth daily.   hydroxychloroquine 200 MG tablet Commonly known as: PLAQUENIL Take 200 mg by mouth daily.   losartan 100 MG tablet Commonly known as: COZAAR Take 1 tablet (100 mg total) by mouth daily.   Lumigan 0.01 % Soln Generic drug: bimatoprost Place 1 drop  into the left eye at bedtime.   Resveratrol 50 MG Caps Take 50 mg by mouth daily.   rosuvastatin 40 MG tablet Commonly known as: Crestor Take 1 tablet (40 mg total) by mouth daily.   Simbrinza 1-0.2 % Susp Generic drug: Brinzolamide-Brimonidine Place 1 drop into the left eye in the morning, at noon, and at bedtime.   Ubiquinol 100 MG Caps Take 100 mg by mouth.   UP4 PROBIOTICS WOMENS PO Take 1 capsule by mouth daily.   verapamil 120 MG CR tablet Commonly known as: CALAN-SR Take 1 tablet (120 mg total) by mouth at bedtime.   Vitamin D-3 125 MCG (5000 UT) Tabs Take by mouth daily.        All past medical history, surgical history, allergies, family history, immunizations andmedications were updated in the EMR today and reviewed under the history and medication portions of their EMR.     ROS Negative, with the exception of above mentioned in HPI   Objective:  BP (!) 196/71   Pulse 80   Temp 98 F (36.7 C)   Wt 83 lb 12.8 oz (38 kg)   SpO2 100%   BMI 14.84 kg/m  Body mass index is 14.84 kg/m. Physical Exam Genitourinary:    General: Normal vulva.     Exam position: Knee-chest position.     Pubic Area: No rash.      Labia:        Right: No rash, tenderness or lesion.        Left: No rash, tenderness or lesion.      Urethra: Prolapse present.       Comments: Unable to reproduce prolapse with valsalva .      No results found. No results found. No results found for this or any previous visit (from the past 24 hour(s)).  Assessment/Plan: [name] is a 76 y.o. 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  Exam normal today, other than urethral prolapse present. . Was unable to reproduce prolapse while laying down. Pt also reported prolapse occurs more in the evening hours. Per her report and we were able to show her image of cervical/bladder prolapse, and she confirmed similar presentation.  We briefly discussed tx plans for the  different types of organ prolapses.  - Ambulatory referral to Gynecology  Reviewed expectations re: course of current medical issues. Discussed self-management of symptoms. Outlined signs and symptoms indicating need for more acute intervention. Patient verbalized understanding and all questions were  answered. Patient received an After-Visit Summary.    Orders Placed This Encounter  Procedures   Ambulatory referral to Gynecology   No orders of the defined types were placed in this encounter.  Referral Orders         Ambulatory referral to Gynecology       Note is dictated utilizing voice recognition software. Although note has been proof read prior to signing, occasional typographical errors still can be missed. If any questions arise, please do not hesitate to call for verification.   electronically signed by:  Howard Pouch, DO  [place]

## 2021-11-01 ENCOUNTER — Telehealth: Payer: Self-pay

## 2021-11-01 MED ORDER — VERAPAMIL HCL ER 120 MG PO TBCR: 120.0000 mg | EXTENDED_RELEASE_TABLET | Freq: Every day | ORAL | 0 refills | Status: DC

## 2021-11-01 NOTE — Telephone Encounter (Signed)
Patient refill request,  [place]  Patient states that [facility] has sent multiple requests for refill with no response. Patient does not have meds to get her thru weekend.  verapamil (CALAN-SR) 120 MG CR tablet [[id number]]

## 2021-11-01 NOTE — Telephone Encounter (Signed)
Pt advised refill sent. They are still working on it.

## 2021-11-11 ENCOUNTER — Ambulatory Visit: Payer: Medicare Other | Admitting: Obstetrics and Gynecology

## 2021-11-11 ENCOUNTER — Encounter: Payer: Self-pay | Admitting: Obstetrics and Gynecology

## 2021-11-11 ENCOUNTER — Other Ambulatory Visit (HOSPITAL_COMMUNITY)
Admission: RE | Admit: 2021-11-11 | Discharge: 2021-11-11 | Disposition: A | Discharge status: Home / Self Care | Payer: Medicare Other | Source: Ambulatory Visit | Attending: Obstetrics and Gynecology | Admitting: Obstetrics and Gynecology

## 2021-11-11 VITALS — BP 180/100 | HR 80 | Ht 64.0 in | Wt 84.0 lb

## 2021-11-11 DIAGNOSIS — Z1151 Encounter for screening for human papillomavirus (HPV): Secondary | ICD-10-CM | POA: Insufficient documentation

## 2021-11-11 DIAGNOSIS — N812 Incomplete uterovaginal prolapse: Secondary | ICD-10-CM

## 2021-11-11 DIAGNOSIS — R030 Elevated blood-pressure reading, without diagnosis of hypertension: Secondary | ICD-10-CM | POA: Diagnosis not present

## 2021-11-11 DIAGNOSIS — Z124 Encounter for screening for malignant neoplasm of cervix: Secondary | ICD-10-CM | POA: Insufficient documentation

## 2021-11-11 DIAGNOSIS — R140 Abdominal distension (gaseous): Secondary | ICD-10-CM | POA: Diagnosis not present

## 2021-11-11 DIAGNOSIS — Z01419 Encounter for gynecological examination (general) (routine) without abnormal findings: Secondary | ICD-10-CM | POA: Diagnosis not present

## 2021-11-11 NOTE — Patient Instructions (Signed)
Pelvic Organ Prolapse Pelvic organ prolapse is a condition in women that involves the stretching, bulging, or dropping of pelvic organs into an abnormal position, past the opening of the vagina. It happens when the muscles and tissues that surround and support pelvic structures become weak or stretched. Pelvic organ prolapse can involve the: Vagina (vaginal prolapse). Uterus (uterine prolapse). Bladder (cystocele). Rectum (rectocele). Intestines (enterocele). When organs other than the vagina are involved, they often bulge into the vagina or protrude from the vagina, depending on how severe the prolapse is. What are the causes? This condition may be caused by: Pregnancy, labor, and childbirth. Past pelvic surgery. Lower levels of the hormone estrogen due to menopause. Consistently lifting more than 50 lb (23 kg). Obesity. Long-term difficulty passing stool (chronic constipation). Long-term, or chronic, cough. Fluid buildup in the abdomen due to certain conditions. What are the signs or symptoms? Symptoms of this condition include: Leaking a little urine (loss of bladder control) when you cough, sneeze, strain, and exercise (stress incontinence). This may be worse immediately after childbirth. It may gradually improve over time. Feeling pressure in your pelvis or vagina. This pressure may increase when you cough or when you are passing stool. A bulge that protrudes from the opening of your vagina. Difficulty passing urine or stool. Pain in your lower back. Pain or discomfort during sex, or decreased interest in sex. Repeated bladder infections (urinary tract infections). Difficulty inserting a tampon. In some people, this condition causes no symptoms. How is this diagnosed? This condition may be diagnosed based on a vaginal and rectal exam. During the exam, you may be asked to cough and strain while you are lying down, sitting, and standing up. Your health care provider will determine if  other tests are required, such as bladder function tests. How is this treated? Treatment for this condition may depend on your symptoms. Treatment may include: Lifestyle changes, such as drinking plenty of fluids and eating foods that are high in fiber. Emptying your bladder at scheduled times (bladder training therapy). This can help reduce or avoid urinary incontinence. Estrogen. This may help mild prolapse by increasing the strength and tone of pelvic floor muscles. Kegel exercises. These may help mild cases of prolapse by strengthening and tightening the muscles of the pelvic floor. A soft, flexible device that helps support the vaginal walls and keep pelvic organs in place (pessary). This is inserted into your vagina by your health care provider. Surgery. This is often the only form of treatment for severe prolapse. Follow these instructions at home: Eating and drinking Avoid drinking beverages that contain caffeine or alcohol. Increase your intake of high-fiber foods to decrease constipation and straining during bowel movements. Activity Lose weight if recommended by your health care provider. Avoid heavy lifting and straining with exercise and work. Do not hold your breath when you perform mild to moderate lifting and exercise activities. Limit your activities as directed by your health care provider. Do Kegel exercises as directed by your health care provider. To do this: Squeeze your pelvic floor muscles tight. You should feel a tight lift in your rectal area and a tightness in your vaginal area. Keep your stomach, buttocks, and legs relaxed. Hold the muscles tight for up to 10 seconds. Then relax your muscles. Repeat this exercise 50 times a day, or as much as told by your health care provider. Continue to do this exercise for at least 4-6 weeks, or for as long as told by your health care provider.   General instructions Take over-the-counter and prescription medicines only as told by  your health care provider. Wear a sanitary pad or adult diapers if you have urinary incontinence. If you have a pessary, take care of it as told by your health care provider. Keep all follow-up visits. This is important. Contact a health care provider if you: Have symptoms that interfere with your daily activities or sex life. Need medicine to help with the discomfort. Notice bleeding from your vagina that is not related to your menstrual period. Have a fever. Have pain or bleeding when you urinate. Have bleeding when you pass stool. Pass urine when you have sex. Have chronic constipation. Have a pessary that falls out. Have a foul-smelling vaginal discharge. Have an unusual, low pain in your abdomen. Get help right away if you: Cannot pass urine. Summary Pelvic organ prolapse is the stretching, bulging, or dropping of pelvic organs into an abnormal position. It happens when the muscles and tissues that surround and support pelvic structures become weak or stretched. When organs other than the vagina are involved, they often bulge into the vagina or protrude from it, depending on how severe the prolapse is. In most cases, this condition needs to be treated only if it produces symptoms. Treatment may include lifestyle changes, estrogen, Kegel exercises, pessary insertion, or surgery. Avoid heavy lifting and straining with exercise and work. Do not hold your breath when you perform mild to moderate lifting and exercise activities. Limit your activities as directed by your health care provider. This information is not intended to replace advice given to you by your health care provider. Make sure you discuss any questions you have with your health care provider. Document Revised: 06/27/2019 Document Reviewed: 06/27/2019 Elsevier Patient Education  2023 Elsevier Inc.  

## 2021-11-11 NOTE — Progress Notes (Signed)
76 y.o. No obstetric history on file. Married Caucasian female here for vaginal prolapse  Saw her PCP 2 weeks ago for vaginal protrusion since August/September.  Feels like she is having early satiety and bloating.  By night, she feels a vaginal bulge.   No problems emptying her bladder.  Scattered urinary incontinence when she gets out of bed at night, so she wears pads.  No leakage with cough, sneeze, or laugh.  No leakage of urine for no reason.  DF - more in the am after tea and coffee, voids every 30 - 60 min.  In the later part of the day, every 2 hours or more.  NF - 2 -3 times a night.   Stool is less formed.  No constipation.  Has had fecal incontinence, the last time was one month ago.   No vaginal bleeding.   States she lost 6 pounds over the last couple of years.   She monitors her blood pressure at home.  It is usually in the 130 range for the systolic number, per patient.  Moved in from [place].  Was living at [facility].  Husband needs to be near [facility].   Son in [place] and daughter in [place].  Has one grand daughter in [place].   PCP:   Howard Pouch, DO  No LMP recorded. Patient is postmenopausal.           Sexually active: No.  The current method of family planning is post menopausal status.    Exercising: Yes.    Home exercise routine includes walking 1 hrs per week. Smoker:  no  Health Maintenance: Pap:  20 years ago per pt History of abnormal Pap:  no MMG: 15 years ago Colonoscopy: never had one BMD: 15 years ago per pt TDaP:  08/04/2019 Gardasil:   no Screening Labs:  PCP   reports that she has never smoked. She has never been exposed to tobacco smoke. She has never used smokeless tobacco. She reports that she does not drink alcohol and does not use drugs.  Past Medical History:  Diagnosis Date   Allergy Spring 2010   Cataract March 2021   Glaucoma Approximately 2005   Horseshoe retinal tear of left eye 04/02/2021   Hyperlipidemia LDL  goal <70 01/11/2021   Hypertension 2022   Long-term use of Plaquenil 04/02/2021   Lupus ([place])    new rheum questioning original dx. continuing Plaquenil at lower dose for now.   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   Positive TB test    Pseudophakia of both eyes 04/02/2021   Retinal detachment with multiple breaks, right eye 04/02/2021   Stroke [facility]) April 2022   Thyroid disease     Past Surgical History:  Procedure Laterality Date   ENDARTERECTOMY Right 05/02/2020   Procedure: RIGHT CAROTID ENDARTERECTOMY;  Surgeon: Rosetta Posner, MD;  Location: [facility];  Service: Vascular;  Laterality: Right;   EYE SURGERY  01/2020   SCLERAL BUCKLE Right 2004   TUBAL LIGATION  Feb 1982    Current Outpatient Medications  Medication Sig Dispense Refill   ARMOUR THYROID 30 MG tablet Take 1 tablet (30 mg total) by mouth daily. 90 tablet 4   aspirin EC 81 MG tablet Take 1 tablet (81 mg total) by mouth daily. Swallow whole. 30 tablet 11   cetirizine (ZYRTEC) 10 MG chewable tablet Chew 10 mg by mouth daily.     Cholecalciferol (VITAMIN D-3) 125 MCG (5000 UT) TABS Take by mouth daily.  hydroxychloroquine (PLAQUENIL) 200 MG tablet Take 200 mg by mouth daily.     losartan (COZAAR) 100 MG tablet Take 1 tablet (100 mg total) by mouth daily. 90 tablet 1   LUMIGAN 0.01 % SOLN Place 1 drop into the left eye at bedtime.     Misc Natural Products (AIRBORNE ELDERBERRY) CHEW Chew 1 tablet by mouth in the morning and at bedtime.     Probiotic Product (UP4 PROBIOTICS WOMENS PO) Take 1 capsule by mouth daily.     Resveratrol 50 MG CAPS Take 50 mg by mouth daily.     rosuvastatin (CRESTOR) 40 MG tablet Take 1 tablet (40 mg total) by mouth daily. 90 tablet 0   SIMBRINZA 1-0.2 % SUSP Place 1 drop into the left eye in the morning, at noon, and at bedtime.     Ubiquinol 100 MG CAPS Take 100 mg by mouth.     verapamil (CALAN-SR) 120 MG CR tablet Take 1 tablet (120 mg total) by mouth at bedtime. 30 tablet 0   No current  facility-administered medications for this visit.    Family History  Problem Relation Age of Onset   ALS Mother    Early death Mother    Dementia Father    Hypertension Father    Macular degeneration Maternal Aunt    Alcohol abuse Paternal Uncle    Alcohol abuse Paternal Uncle     Review of Systems  Genitourinary:        Vaginal prolapse    Exam:   BP (!) 180/100 (BP Location: Right Arm, Patient Position: Sitting, Cuff Size: Normal)   Pulse 80   Ht '5\' 4"'$  (1.626 m)   Wt 84 lb (38.1 kg)   BMI 14.42 kg/m    Repeat BP 180/80. General appearance: alert, cooperative and appears stated age Head: normocephalic, without obvious abnormality, atraumatic Neck: no adenopathy, supple, symmetrical, trachea midline and thyroid normal to inspection and palpation Lungs: clear to auscultation bilaterally Breasts: normal appearance, no masses or tenderness, No nipple retraction or dimpling, No nipple discharge or bleeding, No axillary adenopathy Heart: regular rate and rhythm Abdomen: soft, non-tender; no masses, no organomegaly Extremities: extremities normal, atraumatic, no cyanosis or edema Skin: skin color, texture, turgor normal. No rashes or lesions Lymph nodes: cervical, supraclavicular, and axillary nodes normal. Neurologic: grossly normal  Pelvic: External genitalia:  urethral prolapse.               No abnormal inguinal nodes palpated.              Urethra:  normal appearing urethra with no masses, tenderness or lesions              Bartholins and Skenes: normal                 Vagina: normal appearing vagina with normal color and discharge, no lesions.  First degree cystocele and uterine prolapse.               Cervix: no lesions              Pap taken: yes. Bimanual Exam:  Uterus:  normal size, contour, position, consistency, mobility, non-tender              Adnexa: no mass, fullness, tenderness              Rectal exam: yes.  Confirms.              Anus:  normal sphincter  tone, no  lesions  Chaperone was present for exam:  Kimalexis, CMA  Assessment:   Pelvic organ prolapse.  First degree cystocele and uterine prolapse.  Urethral prolapse. Abdominal bloating.  Encounter for breast and pelvic exam and pap.  Cervical cancer screening.  Hx lupus.  Hx stroke. HTN.  Elevated blood pressure today.  BP 180/80 on repeat BP check.   Plan: Mammogram screening recommended.  Information on locations to patient.  Self breast awareness reviewed. Pap and HR HPV collected. We discussed prolapse and options for care:  observational management, pelvic floor therapy, pessary use, and surgical correction.  Will proceed with pelvic US first, and then determine her plan of care for the prolapse.  Fu with PCP regarding her HTN.   After visit summary provided.

## 2021-11-14 LAB — CYTOLOGY - PAP
Comment: NEGATIVE
Diagnosis: UNDETERMINED — AB
High risk HPV: NEGATIVE

## 2021-11-19 ENCOUNTER — Encounter: Payer: Self-pay | Admitting: Family Medicine

## 2021-11-19 ENCOUNTER — Ambulatory Visit (INDEPENDENT_AMBULATORY_CARE_PROVIDER_SITE_OTHER): Payer: Medicare Other | Admitting: Family Medicine

## 2021-11-19 VITALS — BP 160/64 | HR 65 | Temp 98.2°F | Wt 84.0 lb

## 2021-11-19 DIAGNOSIS — E785 Hyperlipidemia, unspecified: Secondary | ICD-10-CM

## 2021-11-19 DIAGNOSIS — E034 Atrophy of thyroid (acquired): Secondary | ICD-10-CM

## 2021-11-19 DIAGNOSIS — Z8673 Personal history of transient ischemic attack (TIA), and cerebral infarction without residual deficits: Secondary | ICD-10-CM | POA: Diagnosis not present

## 2021-11-19 DIAGNOSIS — I10 Essential (primary) hypertension: Secondary | ICD-10-CM

## 2021-11-19 DIAGNOSIS — D6869 Other thrombophilia: Secondary | ICD-10-CM | POA: Diagnosis not present

## 2021-11-19 MED ORDER — VERAPAMIL HCL ER 180 MG PO TBCR: 180.0000 mg | EXTENDED_RELEASE_TABLET | Freq: Every day | ORAL | 1 refills | Status: DC

## 2021-11-19 MED ORDER — ROSUVASTATIN CALCIUM 40 MG PO TABS: 40.0000 mg | ORAL_TABLET | Freq: Every day | ORAL | 3 refills | Status: DC

## 2021-11-19 MED ORDER — LOSARTAN POTASSIUM 100 MG PO TABS: 100.0000 mg | ORAL_TABLET | Freq: Every day | ORAL | 1 refills | Status: DC

## 2021-11-19 NOTE — Progress Notes (Signed)
Patient ID: Wendy Jackson, female  DOB: 02-26-1945, 76 y.o.   MRN: [id number] Patient Care Team    Relationship Specialty Notifications Start End  [name], DO PCP - General Family Medicine  12/11/20   Frann Rider, NP Nurse Practitioner Neurology  05/23/20   Marlowe Sax, MD Referring Physician Ophthalmology  05/23/20   Dr. Gypsy Balsam Attending Physician Rheumatology  12/10/20    Comment: [place], [place]  Nunzio Cobbs, MD Consulting Physician Obstetrics and Gynecology  11/11/21     Chief Complaint  Patient presents with   Hypertension    [facility]    Subjective: Wendy Jackson is a 76 y.o.  Female  present for  BMI less than 19,adult Patient reports she has always been rather small and has not weighed more than 100 pounds the majority of her life.   HTN/HLD/History of CVA (cerebrovascular accident)/acquired thrombophilia Pt reports compliance  with losartan 100 and verapmil 120 mg . Patient denies chest pain, shortness of breath, dizziness or lower extremity edema.  Home pressures are in the 140's/60-70 Pt is taking a  daily baby ASA. Pt is  prescribed statin. Prior note: Patient reports April/2022 she suffered from a ischemic stroke.  She states the only symptom she had was paresthesia of her left hand.  She presented to the emergency room and was found to have an ischemic stroke secondary to right carotid artery stenosis.  She underwent right carotid endarterectomy.  She is established with neurology.  She has had rather significant high blood pressure recordings per EMR.  She states that her home blood pressures were usually in the high 130s when on amlodipine 10 mg daily.  However she had significant swelling in her lower extremities and amlodipine was stopped and losartan 25 mg was started.  Swelling has subsided, however her blood pressures at home are still running in the 151V systolic.  She is prescribed Crestor through her neurology team.  She is compliant with  baby aspirin daily.  She has suffered from no long-term deficits from her stroke.   Hypothyroidism, unspecified type Patient reports her Armour Thyroid 30 has been stable for some time.  she is complaint with medication.      06/12/2021    1:56 PM 06/03/2021    1:41 PM 12/11/2020    1:46 PM 05/29/2020    6:18 PM 03/06/2020    3:56 PM  Depression screen PHQ 2/9  Decreased Interest 0 0 0 0 0  Down, Depressed, Hopeless 0 0 0 0 0  PHQ - 2 Score 0 0 0 0 0       No data to display          Immunization History  Administered Date(s) Administered   PFIZER(Purple Top)SARS-COV-2 Vaccination 02/20/2019, 03/16/2019, 11/11/2019, 06/15/2020   Pfizer Covid-19 Vaccine Bivalent Booster 25yr & up 10/05/2020   Tdap 08/04/2019    Past Medical History:  Diagnosis Date   Allergy Spring 2010   Cataract March 2021   Glaucoma Approximately 2005   Horseshoe retinal tear of left eye 04/02/2021   Hyperlipidemia LDL goal <70 01/11/2021   Hypertension 2022   Long-term use of Plaquenil 04/02/2021   Lupus ([place]    new rheum questioning original dx. continuing Plaquenil at lower dose for now.   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   Positive TB test    Pseudophakia of both eyes 04/02/2021   Retinal detachment with multiple breaks, right eye 04/02/2021   Stroke ([facility] April  2022   Thyroid disease    Allergies  Allergen Reactions   Amlodipine Other (See Comments)    Bilateral lower extremity edema   Augmentin [Amoxicillin-Pot Clavulanate] Other (See Comments)    unk   Tramadol     Nausea    Flexeril [Cyclobenzaprine] Palpitations   Past Surgical History:  Procedure Laterality Date   ENDARTERECTOMY Right 05/02/2020   Procedure: RIGHT CAROTID ENDARTERECTOMY;  Surgeon: Rosetta Posner, MD;  Location: [facility] OR;  Service: Vascular;  Laterality: Right;   EYE SURGERY  01/2020   SCLERAL BUCKLE Right 2004   TUBAL LIGATION  Feb 1982   Family History  Problem Relation Age of Onset   ALS Mother    Early  death Mother    Dementia Father    Hypertension Father    Macular degeneration Maternal Aunt    Alcohol abuse Paternal Uncle    Alcohol abuse Paternal Uncle    Social History   Social History Narrative   Marital status/children/pets: Married.  2 children.   Education/employment: Retired.  College-educated.   Safety:      -smoke alarm in the home:Yes     - wears seatbelt: Yes     - Feels safe in their relationships: Yes       Allergies as of 11/19/2021       Reactions   Amlodipine Other (See Comments)   Bilateral lower extremity edema   Augmentin [amoxicillin-pot Clavulanate] Other (See Comments)   unk   Tramadol    Nausea   Flexeril [cyclobenzaprine] Palpitations        Medication List        Accurate as of November 19, 2021  2:01 PM. If you have any questions, ask your nurse or doctor.          STOP taking these medications    Simbrinza 1-0.2 % Susp Generic drug: Brinzolamide-Brimonidine Stopped by: Howard Pouch, DO       TAKE these medications    Airborne Elderberry Chew Chew 1 tablet by mouth in the morning and at bedtime.   Armour Thyroid 30 MG tablet Generic drug: thyroid Take 1 tablet (30 mg total) by mouth daily.   aspirin EC 81 MG tablet Take 1 tablet (81 mg total) by mouth daily. Swallow whole.   cetirizine 10 MG chewable tablet Commonly known as: ZYRTEC Chew 10 mg by mouth daily.   hydroxychloroquine 200 MG tablet Commonly known as: PLAQUENIL Take 200 mg by mouth daily.   losartan 100 MG tablet Commonly known as: COZAAR Take 1 tablet (100 mg total) by mouth daily.   Lumigan 0.01 % Soln Generic drug: bimatoprost Place 1 drop into the left eye at bedtime.   Resveratrol 50 MG Caps Take 50 mg by mouth daily.   rosuvastatin 40 MG tablet Commonly known as: Crestor Take 1 tablet (40 mg total) by mouth daily.   Ubiquinol 100 MG Caps Take 100 mg by mouth.   UP4 PROBIOTICS WOMENS PO Take 1 capsule by mouth daily.   verapamil  180 MG CR tablet Commonly known as: CALAN-SR Take 1 tablet (180 mg total) by mouth at bedtime. What changed:  medication strength how much to take Changed by: Howard Pouch, DO   Vitamin D-3 125 MCG (5000 UT) Tabs Take by mouth daily.        All past medical history, surgical history, allergies, family history, immunizations andmedications were updated in the EMR today and reviewed under the history and medication portions of their EMR.  ROS 14 pt review of systems performed and negative (unless mentioned in an HPI)  Objective: BP (!) 160/64   Pulse 65   Temp 98.2 F (36.8 C)   Wt 84 lb (38.1 kg)   SpO2 100%   BMI 14.42 kg/m  Physical Exam Vitals and nursing note reviewed.  Constitutional:      General: She is not in acute distress.    Appearance: Normal appearance. She is not ill-appearing, toxic-appearing or diaphoretic.  HENT:     Head: Normocephalic and atraumatic.  Eyes:     General: No scleral icterus.       Right eye: No discharge.        Left eye: No discharge.     Extraocular Movements: Extraocular movements intact.     Conjunctiva/sclera: Conjunctivae normal.     Pupils: Pupils are equal, round, and reactive to light.  Cardiovascular:     Rate and Rhythm: Normal rate and regular rhythm.  Pulmonary:     Effort: Pulmonary effort is normal. No respiratory distress.     Breath sounds: Normal breath sounds. No wheezing, rhonchi or rales.  Musculoskeletal:     Right lower leg: No edema.     Left lower leg: No edema.  Lymphadenopathy:     Cervical: No cervical adenopathy.  Skin:    General: Skin is warm and dry.     Coloration: Skin is not jaundiced or pale.     Findings: No erythema or rash.  Neurological:     Mental Status: She is alert and oriented to person, place, and time. Mental status is at baseline.     Motor: No weakness.     Gait: Gait normal.  Psychiatric:        Mood and Affect: Mood normal.        Behavior: Behavior normal.         Thought Content: Thought content normal.        Judgment: Judgment normal.      No results found.  Assessment/plan: Sheilyn Boehlke is a 76 y.o. female present for [facility] Hypertension/hyperlipidemia/history of CVA (cerebrovascular accident)/acquired thrombophilia/WCS Home pressures are now mildly higher than desired. Her home cuff is accurate in comparison today to our cuff readings  Continue losartan to 100 mg with close follow-up.   Increase verapamil 120 mg qhs. > 180 mg qhs Continue ASA 81 Low-sodium diet. continue Crestor 40 mg daily prescribed by her neurology team. Labs UTD-due next visit She is established with neurology Follow-up 5.5 mos.    Hypothyroidism, unspecified type Has been stable Continue Armour Thyroid 30 mg daily Labs due next visit   Return in about 24 weeks (around 05/06/2022) for Routine chronic condition follow-up.  No orders of the defined types were placed in this encounter.   No orders of the defined types were placed in this encounter.  Meds ordered this encounter  Medications   losartan (COZAAR) 100 MG tablet    Sig: Take 1 tablet (100 mg total) by mouth daily.    Dispense:  90 tablet    Refill:  1   rosuvastatin (CRESTOR) 40 MG tablet    Sig: Take 1 tablet (40 mg total) by mouth daily.    Dispense:  90 tablet    Refill:  3   verapamil (CALAN-SR) 180 MG CR tablet    Sig: Take 1 tablet (180 mg total) by mouth at bedtime.    Dispense:  90 tablet    Refill:  1   Referral  Orders  No referral(s) requested today     Electronically signed by: Howard Pouch, [place]

## 2021-11-19 NOTE — Progress Notes (Unsigned)
[facility] [address] [place]. [place] [address] [phone]       STROKE FOLLOW UP NOTE  [name] Date of Birth:  1945/05/06 Medical Record Number:  [id number]   Reason for Referral: stroke follow up    SUBJECTIVE:   CHIEF COMPLAINT:  No chief complaint on file.   HPI:  Update 11/20/2021 JM: Patient returns per request for stroke follow-up ***.  Overall stable without new or reoccurring stroke/TIA symptoms.  Remains on aspirin and Crestor.  Blood pressure ***.  Routinely follows with PCP.       History provided for reference purposes only Update 05/07/2021 JM: Patient returns for 11-monthstroke follow-up unaccompanied.  Overall stable without new or reoccurring stroke/TIA symptoms.  She continues to maintain all ADLs and IADLs independently.  Compliant on aspirin and Crestor, denies side effects.  Blood pressure today elevated, reports change to BP medications by PCP yesterday trying to get this more under control.  Closely monitors at home and averages 130s but has noticed this being slightly higher in the morning and afternoon.  Closely followed by PCP Dr. KRaoul Pitchand vascular surgery with recent carotid duplex showing bilateral ICA 1 to 39% stenosis and plans on 1 year follow-up.  No new concerns at this time.  Update 10/23/2020 JM: Returns for 435-monthtroke follow-up unaccompanied.  Overall doing well.  Denies new or reoccurring stroke/TIA symptoms.  Self discontinued aspirin due to concern of possible bleeding risk .  Self d/c'd atrovastatin due to skin reaction which stopped after taking. Blood pressure today elevated (typically elevated at appointments) - stable at home typically 130s/70s.  Cardiac monitor negative for atrial fibrillation.  No new concerns at this time.  Initial visit 06/06/2020 JM: [name] being seen for hospital follow-up accompanied by her husband, LaFritz PickerelShe has been doing well since discharge without new or  reoccurring stroke/TIA symptoms.  She was initially feeling increased fatigue, brain fog and increased anxiety but this is all improved and currently feels at baseline. She has completed 3 weeks DAPT and remains on aspirin alone without associated side effects Remains on atorvastatin 40 mg daily without associated side effects Blood pressure today 163/73 but reports typically elevated at provider appointments -routinely monitors at home with SBP typically 130s.  Per pt, change of antihypertensives yesterday from amlodipine to losartan due to lower extremity swelling  Cardiac monitor currently wearing til Saturday  She has since had follow-up with vascular surgery and PCP She does still feel some numbness and pain at incision site but this has been gradually improving  She plans on receiving her second COVID-19 booster on Friday  Stroke admission 04/30/2020 CoLeeanna Jackson an [age].o. female with PMH of lupus and thyroid disease who [facility] new onset left hand numbness and weakness with presyncopal sensation and unsteady gait. The patient first noticed the symptoms about 2-3 days ago, followed by resolution. The symptoms returned the day of admission, so she went to the ED for evaluation.  Personally reviewed hospitalization pertinent progress notes, lab work and imaging with summary provided.  Evaluated by Dr. XuErlinda Hongith stroke work-up revealing small acute infarct involving the right precentral and postcentral gyri, embolic pattern most likely due to right ICA stenosis with high risk plaque s/p R CEA 4/20.  Recommended 30-day cardiac event monitor outpatient to rule out A. fib.  Recommended DAPT for 3 weeks and aspirin alone as well as starting atorvastatin 40  mg daily.  LDL 93.  Questionable lupus on Plaquenil due to positive blood test in the past with subsequent blood test negative - per note, patient reported rheumatologist does not believe she has lupus and  their plan was to taper off Plaquenil.  Hypercoagulable work-up negative for resulted labs with some pending at discharge.  Evaluated by therapies and discharged home in stable condition without therapy needs.  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, emboli pattern, most likely due to right ICA stenosis with high risk plaque   MR brain small acute infarct involving the right precentral and postcentral gyri.  No hemorrhage or mass-effect.  Finding of chronic small vessel ischemia CTA head/neck approximately 50% stenosis of the proximal right ICA secondary to mixed density atherosclerosis LE venous Doppler neg Hypercoag labs are negative  Recommend 30 day cardiac event monitoring as outpt to rule out afib-requested from cards master LDL 93 A1c 5.5 No antithrombotic or anticoagulant prior to admission  Now on DAPT w/ASA '81mg'$  and Plavix 75 mg daily x 3 weeks then ASA '81mg'$  alone Therapy recommendations: Cleared without needs  Disposition:  Home         ROS:   14 system review of systems performed and negative with exception of no complaints  PMH:  Past Medical History:  Diagnosis Date   Allergy Spring 2010   Cataract March 2021   Glaucoma Approximately 2005   Horseshoe retinal tear of left eye 04/02/2021   Hyperlipidemia LDL goal <70 01/11/2021   Hypertension 2022   Long-term use of Plaquenil 04/02/2021   Lupus ([place])    new rheum questioning original dx. continuing Plaquenil at lower dose for now.   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   Positive TB test    Pseudophakia of both eyes 04/02/2021   Retinal detachment with multiple breaks, right eye 04/02/2021   Stroke [facility]) April 2022   Thyroid disease     PSH:  Past Surgical History:  Procedure Laterality Date   ENDARTERECTOMY Right 05/02/2020   Procedure: RIGHT CAROTID ENDARTERECTOMY;  Surgeon: Rosetta Posner, MD;  Location: [facility] OR;  Service: Vascular;  Laterality: Right;   EYE SURGERY  01/2020    SCLERAL BUCKLE Right 2004   TUBAL LIGATION  Feb 1982    Social History:  Social History   Socioeconomic History   Marital status: Married    Spouse name: [name]   Number of children: Not on file   Years of education: Not on file   Highest education level: Some college, no degree  Occupational History   Occupation: retired  Tobacco Use   Smoking status: Never    Passive exposure: Never   Smokeless tobacco: Never  Vaping Use   Vaping Use: Never used  Substance and Sexual Activity   Alcohol use: Never   Drug use: Never   Sexual activity: Not Currently    Birth control/protection: Surgical  Other Topics Concern   Not on file  Social History Narrative   Marital status/children/pets: Married.  2 children.   Education/employment: Retired.  College-educated.   Safety:      -smoke alarm in the home:Yes     - wears seatbelt: Yes     - Feels safe in their relationships: Yes      Social Determinants of Health   Financial Resource Strain: Low Risk  (06/12/2021)   Overall Financial Resource Strain (CARDIA)    Difficulty of Paying Living Expenses: Not hard at all  Food Insecurity: No  Food Insecurity (06/12/2021)   Hunger Vital Sign    Worried About Running Out of Food in the Last Year: Never true    Ran Out of Food in the Last Year: Never true  Transportation Needs: No Transportation Needs (06/12/2021)   PRAPARE - Hydrologist (Medical): No    Lack of Transportation (Non-Medical): No  Physical Activity: Insufficiently Active (06/12/2021)   Exercise Vital Sign    Days of Exercise per Week: 4 days    Minutes of Exercise per Session: 30 min  Stress: No Stress Concern Present (06/12/2021)   [place]    Feeling of Stress : Not at all  Social Connections: Moderately Isolated (06/12/2021)   Social Connection and Isolation Panel [NHANES]    Frequency of Communication with Friends and Family:  Twice a week    Frequency of Social Gatherings with Friends and Family: Never    Attends Religious Services: 1 to 4 times per year    Active Member of [employer] or Organizations: No    Attends Archivist Meetings: Never    Marital Status: Married  Human resources officer Violence: Not At Risk (06/12/2021)   Humiliation, Afraid, Rape, and Kick questionnaire    Fear of Current or Ex-Partner: No    Emotionally Abused: No    Physically Abused: No    Sexually Abused: No    Family History:  Family History  Problem Relation Age of Onset   ALS Mother    Early death Mother    Dementia Father    Hypertension Father    Macular degeneration Maternal Aunt    Alcohol abuse Paternal Uncle    Alcohol abuse Paternal Uncle     Medications:   Current Outpatient Medications on File Prior to Visit  Medication Sig Dispense Refill   ARMOUR THYROID 30 MG tablet Take 1 tablet (30 mg total) by mouth daily. 90 tablet 4   aspirin EC 81 MG tablet Take 1 tablet (81 mg total) by mouth daily. Swallow whole. 30 tablet 11   cetirizine (ZYRTEC) 10 MG chewable tablet Chew 10 mg by mouth daily.     Cholecalciferol (VITAMIN D-3) 125 MCG (5000 UT) TABS Take by mouth daily.     hydroxychloroquine (PLAQUENIL) 200 MG tablet Take 200 mg by mouth daily.     losartan (COZAAR) 100 MG tablet Take 1 tablet (100 mg total) by mouth daily. 90 tablet 1   LUMIGAN 0.01 % SOLN Place 1 drop into the left eye at bedtime.     Misc Natural Products (AIRBORNE ELDERBERRY) CHEW Chew 1 tablet by mouth in the morning and at bedtime.     Probiotic Product (UP4 PROBIOTICS WOMENS PO) Take 1 capsule by mouth daily.     Resveratrol 50 MG CAPS Take 50 mg by mouth daily.     rosuvastatin (CRESTOR) 40 MG tablet Take 1 tablet (40 mg total) by mouth daily. 90 tablet 3   Ubiquinol 100 MG CAPS Take 100 mg by mouth.     verapamil (CALAN-SR) 180 MG CR tablet Take 1 tablet (180 mg total) by mouth at bedtime. 90 tablet 1   No current  facility-administered medications on file prior to visit.    Allergies:   Allergies  Allergen Reactions   Amlodipine Other (See Comments)    Bilateral lower extremity edema   Augmentin [Amoxicillin-Pot Clavulanate] Other (See Comments)    unk   Tramadol     Nausea  Flexeril [Cyclobenzaprine] Palpitations      OBJECTIVE:  Physical Exam  There were no vitals filed for this visit.   There is no height or weight on file to calculate BMI. No results found.  General: well developed, well nourished, very pleasant elderly Caucasian female, seated, in no evident distress Head: head normocephalic and atraumatic.   Neck: supple with no carotid or supraclavicular bruits Cardiovascular: regular rate and rhythm, no murmurs Musculoskeletal: BLE pitting edema greater distally Vascular:  Normal pulses all extremities   Neurologic Exam Mental Status: Awake and fully alert.   Fluent speech and language.  Oriented to place and time. Recent and remote memory intact. Attention span, concentration and fund of knowledge appropriate. Mood and affect appropriate.  Cranial Nerves: Pupils equal, briskly reactive to light. Extraocular movements full without nystagmus. Visual fields full to confrontation. Hearing intact. Facial sensation intact. Face, tongue, palate moves normally and symmetrically.  Motor: Normal bulk and tone. Normal strength in all tested extremity muscles Sensory.: intact to touch , pinprick , position and vibratory sensation.  Coordination: Rapid alternating movements normal in all extremities. Finger-to-nose and heel-to-shin performed accurately bilaterally. Gait and Station: Arises from chair without difficulty. Stance is normal. Gait demonstrates normal stride length and balance without use of assistive device. Tandem walk and heel toe without difficulty.  Reflexes: 1+ and symmetric. Toes downgoing.        ASSESSMENT: Wendy Jackson is a 76 y.o. year old female with small  acute infarct involving the right precentral postcentral gyri, embolic pattern most likely due to right ICA stenosis with high risk plaque on 4/18 s/p R CEA.  Vascular risk factors include right carotid stenosis, HTN, HLD, advanced age and questionable lupus diagnosis     PLAN:  R precentral postcentral gyri infarct:  Recovered well without residual deficit.  Continue aspirin 81 mg daily and Crestor 40 mg daily for secondary stroke prevention.  Request medications filled by PCP as these are recommended lifelong use Hypercoagulable labs negative.   Cardiac monitor negative for atrial fibrillation Discussed secondary stroke prevention measures and importance of close PCP follow up for aggressive stroke risk factor management including BP goal<130/90 and HLD with LDL goal<70 Stroke labs 05/06/2021: LDL 56, A1c 5.7 Right ICA stenosis: s/p CEA 4/20. followed by [facility]     Doing well from stroke standpoint without further recommendations and risk factors are managed by PCP. She may follow up PRN, as usual for our patients who are strictly being followed for stroke. If any new neurological issues should arise, request PCP place referral for evaluation by one of our neurologists. Thank you.     CC:  PCP: Howard Pouch A, DO    I spent 26 minutes of face-to-face and non-face-to-face time with patient.  This included previsit chart review, lab review, study review, electronic health record documentation, patient education and discussion regarding history of prior stroke, secondary stroke prevention measures and aggressive stroke risk factor management and answered all other questions to patient's satisfaction   Frann Rider, [facility]  [facility] [address] [place], [place] [address]  Phone [phone] Fax [phone] Note: This document was prepared with digital dictation and possible smart phrase technology. Any transcriptional errors that result from this  process are unintentional.

## 2021-11-19 NOTE — Patient Instructions (Addendum)
Return in about 24 weeks (around 05/06/2022) for Routine chronic condition follow-up.        Great to see you today.  I have refilled the medication(s) we provide.   If labs were collected, we will inform you of lab results once received either by echart message or telephone call.   - echart message- for normal results that have been seen by the patient already.   - telephone call: abnormal results or if patient has not viewed results in their echart.

## 2021-11-20 ENCOUNTER — Ambulatory Visit: Payer: Medicare Other | Admitting: Adult Health

## 2021-11-20 ENCOUNTER — Encounter: Payer: Self-pay | Admitting: Adult Health

## 2021-11-20 VITALS — BP 142/78 | HR 70 | Ht 64.0 in | Wt 84.0 lb

## 2021-11-20 DIAGNOSIS — I63231 Cerebral infarction due to unspecified occlusion or stenosis of right carotid arteries: Secondary | ICD-10-CM | POA: Diagnosis not present

## 2021-11-20 NOTE — Patient Instructions (Addendum)
Continue aspirin 81 mg daily  and Crestor for secondary stroke prevention   Continue to follow up with PCP regarding cholesterol and blood pressure management  Maintain strict control of hypertension with blood pressure goal below 130/90 and cholesterol with LDL cholesterol (bad cholesterol) goal below 70 mg/dL.   Signs of a Stroke? Follow the BEFAST method:  Balance Watch for a sudden loss of balance, trouble with coordination or vertigo Eyes Is there a sudden loss of vision in one or both eyes? Or double vision?  Face: Ask the person to smile. Does one side of the face droop or is it numb?  Arms: Ask the person to raise both arms. Does one arm drift downward? Is there weakness or numbness of a leg? Speech: Ask the person to repeat a simple phrase. Does the speech sound slurred/strange? Is the person confused ? Time: If you observe any of these signs, call 911.       Thank you for coming to see [place] at [facility]. I hope we have been able to provide you high quality care today.  You may receive a patient satisfaction survey over the next few weeks. We would appreciate your feedback and comments so that we may continue to improve ourselves and the health of our patients.

## 2021-11-26 DIAGNOSIS — Z79899 Other long term (current) drug therapy: Secondary | ICD-10-CM | POA: Diagnosis not present

## 2021-11-26 DIAGNOSIS — H401133 Primary open-angle glaucoma, bilateral, severe stage: Secondary | ICD-10-CM | POA: Diagnosis not present

## 2021-11-26 DIAGNOSIS — H33021 Retinal detachment with multiple breaks, right eye: Secondary | ICD-10-CM | POA: Diagnosis not present

## 2021-11-26 DIAGNOSIS — H35371 Puckering of macula, right eye: Secondary | ICD-10-CM | POA: Diagnosis not present

## 2021-11-26 DIAGNOSIS — H33312 Horseshoe tear of retina without detachment, left eye: Secondary | ICD-10-CM | POA: Diagnosis not present

## 2021-12-10 NOTE — Progress Notes (Signed)
GYNECOLOGY  VISIT   HPI: 76 y.o.   Married  Caucasian  female   [id number] with No LMP recorded. Patient is postmenopausal.   here for   U/S consult. She has had abdominal bloating.   Her husband is present for the discussion.  Has not had a colonoscopy.  Has also lost weight, about 16 pounds.  Family is concerned.   She has pelvic organ prolapse with a first degree cystocele and uterine prolapse.   GYNECOLOGIC HISTORY: No LMP recorded. Patient is postmenopausal. 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   Other female genital prolapse 10/29/2021   Immunization not carried out because of parent refusal 05/06/2021   Long-term use of Plaquenil 04/02/2021   Primary open angle glaucoma (POAG) of both eyes, severe stage 04/02/2021   White coat syndrome with diagnosis of hypertension 01/11/2021   Hyperlipidemia LDL goal <70 01/11/2021   Acquired thrombophilia ([place]) 12/11/2020   BMI less than 19,adult 12/11/2020   Hypothyroidism 12/11/2020   History of CVA (cerebrovascular accident) 05/02/2020   Lupus ([place])    Allergic rhinitis     Past Medical History:  Diagnosis Date   Allergy Spring 2010   Cataract 03/2019   Glaucoma Approximately 2005   Horseshoe retinal tear of left eye 04/02/2021   Hyperlipidemia LDL goal <70 01/11/2021   Hypertension 2022   Long-term use of Plaquenil 04/02/2021   Lupus ([place])    new rheum questioning original dx. continuing Plaquenil at lower dose for now.   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 bladder, acquired    Pseudophakia of  both eyes 04/02/2021   Retinal detachment with multiple breaks, right eye 04/02/2021   Stroke ([place]) 04/2020   Thyroid disease     Past Surgical History:  Procedure Laterality Date   ENDARTERECTOMY Right 05/02/2020   Procedure: RIGHT CAROTID ENDARTERECTOMY;  Surgeon: Rosetta Posner, MD;  Location: [place];  Service: Vascular;  Laterality: Right;   EYE SURGERY  01/2020   SCLERAL BUCKLE Right 2004   TUBAL LIGATION  Feb 1982    Current Outpatient Medications  Medication Sig Dispense Refill   ARMOUR THYROID 30 MG tablet Take 1 tablet (30 mg total) by mouth daily. 90 tablet 4   aspirin EC 81 MG tablet Take 1 tablet (81 mg total) by mouth daily. Swallow whole. 30 tablet 11   cetirizine (ZYRTEC) 10 MG chewable tablet Chew 10 mg by mouth daily.     Cholecalciferol (VITAMIN D-3) 125 MCG (5000 UT) TABS Take by mouth daily.     hydroxychloroquine (PLAQUENIL) 200 MG tablet Take 200 mg by mouth daily.     losartan (COZAAR) 100 MG tablet Take 1 tablet (100 mg total) by mouth daily. 90 tablet 1  LUMIGAN 0.01 % SOLN Place 1 drop into the left eye at bedtime.     Misc Natural Products (AIRBORNE ELDERBERRY) CHEW Chew 1 tablet by mouth in the morning and at bedtime.     Probiotic Product (UP4 PROBIOTICS WOMENS PO) Take 1 capsule by mouth daily.     Resveratrol 50 MG CAPS Take 50 mg by mouth daily.     rosuvastatin (CRESTOR) 40 MG tablet Take 1 tablet (40 mg total) by mouth daily. 90 tablet 3   Ubiquinol 100 MG CAPS Take 100 mg by mouth.     verapamil (CALAN-SR) 180 MG CR tablet Take 1 tablet (180 mg total) by mouth at bedtime. 90 tablet 1   No current facility-administered medications for this visit.     ALLERGIES: Amlodipine, Augmentin [amoxicillin-pot clavulanate], Tramadol, and Flexeril [cyclobenzaprine]  Family History  Problem Relation Age of Onset   ALS Mother    Early death Mother    Dementia Father    Hypertension Father    Macular degeneration Maternal Aunt    Alcohol abuse Paternal  Uncle    Alcohol abuse Paternal Uncle     Social History   Socioeconomic History   Marital status: Married    Spouse name: [name]   Number of children: Not on file   Years of education: Not on file   Highest education level: Some college, no degree  Occupational History   Occupation: retired  Tobacco Use   Smoking status: Never    Passive exposure: Never   Smokeless tobacco: Never  Vaping Use   Vaping Use: Never used  Substance and Sexual Activity   Alcohol use: Never   Drug use: Never   Sexual activity: Not Currently    Birth control/protection: Surgical  Other Topics Concern   Not on file  Social History Narrative   Marital status/children/pets: Married.  2 children.   Education/employment: Retired.  College-educated.   Safety:      -smoke alarm in the home:Yes     - wears seatbelt: Yes     - Feels safe in their relationships: Yes      Social Determinants of Health   Financial Resource Strain: Low Risk  (06/12/2021)   Overall Financial Resource Strain (CARDIA)    Difficulty of Paying Living Expenses: Not hard at all  Food Insecurity: No Food Insecurity (06/12/2021)   Hunger Vital Sign    Worried About Running Out of Food in the Last Year: Never true    Ran Out of Food in the Last Year: Never true  Transportation Needs: No Transportation Needs (06/12/2021)   PRAPARE - Hydrologist (Medical): No    Lack of Transportation (Non-Medical): No  Physical Activity: Insufficiently Active (06/12/2021)   Exercise Vital Sign    Days of Exercise per Week: 4 days    Minutes of Exercise per Session: 30 min  Stress: No Stress Concern Present (06/12/2021)   [place]    Feeling of Stress : Not at all  Social Connections: Moderately Isolated (06/12/2021)   Social Connection and Isolation Panel [NHANES]    Frequency of Communication with Friends and Family: Twice a week    Frequency of  Social Gatherings with Friends and Family: Never    Attends Religious Services: 1 to 4 times per year    Active Member of [employer] or Organizations: No    Attends Archivist Meetings: Never    Marital Status: Married  Intimate Partner Violence: Not At Risk (06/12/2021)   Humiliation, Afraid, Rape, and Kick questionnaire    Fear of Current or Ex-Partner: No    Emotionally Abused: No    Physically Abused: No    Sexually Abused: No    Review of Systems  All other systems reviewed and are negative.   PHYSICAL EXAMINATION:    BP (!) 140/82 (BP Location: Left Arm, Patient Position: Sitting, Cuff Size: Normal)   Ht '5\' 4"'$  (1.626 m)   Wt 84 lb (38.1 kg)   BMI 14.42 kg/m     General appearance: alert, cooperative and appears stated age   Pelvic US Uterus 5.12 x 3.61 x 1.99 cm.   No myometrial masses. EMS 1.21 mm.  Left ovary 1.33 x 1.01 x 0.79 cm.  Right ovary 2.32 x 0.97 cm x 1.12 cm.  No adnexal masses.  No free fluid.  ASSESSMENT  Abdominal bloating.  Weight loss.  Colon cancer screening. Pelvic organ prolapse. Urinary frequency.  PLAN  Pelvic US images and report reviewed.  Reassurance given regarding the appearance of her uterus and ovaries on ultrasound. Options for care for the prolapse reviewed:  observation, pessary, pelvic floor therapy, and surgery.  I placed a referral for colonoscopy.  Will refer for pelvic floor therapy. FU in 6 to 12 months.   An After Visit Summary was printed and given to the patient.  25 min  total time was spent for this patient encounter, including preparation, face-to-face counseling with the patient, coordination of care, and documentation of the encounter.

## 2021-12-12 ENCOUNTER — Other Ambulatory Visit: Payer: Self-pay | Admitting: Obstetrics and Gynecology

## 2021-12-12 ENCOUNTER — Ambulatory Visit (INDEPENDENT_AMBULATORY_CARE_PROVIDER_SITE_OTHER): Payer: Medicare Other

## 2021-12-12 ENCOUNTER — Encounter: Payer: Self-pay | Admitting: Obstetrics and Gynecology

## 2021-12-12 ENCOUNTER — Ambulatory Visit (INDEPENDENT_AMBULATORY_CARE_PROVIDER_SITE_OTHER): Payer: Medicare Other | Admitting: Obstetrics and Gynecology

## 2021-12-12 VITALS — BP 140/82 | Ht 64.0 in | Wt 84.0 lb

## 2021-12-12 DIAGNOSIS — R140 Abdominal distension (gaseous): Secondary | ICD-10-CM

## 2021-12-12 DIAGNOSIS — Z1211 Encounter for screening for malignant neoplasm of colon: Secondary | ICD-10-CM

## 2021-12-12 DIAGNOSIS — R350 Frequency of micturition: Secondary | ICD-10-CM | POA: Diagnosis not present

## 2021-12-12 DIAGNOSIS — N812 Incomplete uterovaginal prolapse: Secondary | ICD-10-CM

## 2021-12-14 ENCOUNTER — Telehealth: Payer: Self-pay | Admitting: Obstetrics and Gynecology

## 2021-12-14 DIAGNOSIS — R350 Frequency of micturition: Secondary | ICD-10-CM

## 2021-12-14 DIAGNOSIS — N812 Incomplete uterovaginal prolapse: Secondary | ICD-10-CM

## 2021-12-14 NOTE — Telephone Encounter (Signed)
Please make referral for pelvic floor therapy with [facility] for my patient.  She has pelvic organ prolapse and urinary frequency.

## 2021-12-16 NOTE — Telephone Encounter (Signed)
Referral placed at [facility] [place] [facility] they will call to schedule.

## 2021-12-19 NOTE — Telephone Encounter (Signed)
Encounter reviewed and closed.  

## 2021-12-19 NOTE — Telephone Encounter (Signed)
Patient scheduled on 12/24 for PT

## 2021-12-24 ENCOUNTER — Telehealth: Payer: Self-pay | Admitting: *Deleted

## 2021-12-24 DIAGNOSIS — R140 Abdominal distension (gaseous): Secondary | ICD-10-CM

## 2021-12-24 DIAGNOSIS — Z1211 Encounter for screening for malignant neoplasm of colon: Secondary | ICD-10-CM

## 2021-12-24 NOTE — Telephone Encounter (Signed)
Call returned to patient.  Referral previously placed to Dr. Carlean Purl for abdominal bloating and colon cancer screening. Dr. Carlean Purl no longer accepting new patients, patient requesting new referral.  Left detailed message requesting return call to advise where she would like referral to go, we can then place new referral. Return call to [facility] at [phone], OPT 4.    Order pended.   Routing to [facility] for f/u.

## 2021-12-27 ENCOUNTER — Encounter: Payer: Self-pay | Admitting: Gastroenterology

## 2021-12-27 NOTE — Telephone Encounter (Signed)
Per pt's request, she wanted to try to see [place], MD at the same location. Referral sent.

## 2021-12-27 NOTE — Telephone Encounter (Signed)
Pt called back and LVM in triage box, however was just returning call.  Tried to call pt back, went straight to VM. Per DPR, left detailed msg regarding if there was a particular GI provider that she knew and she wanted a referral to or if she just wanted [place] to send the referral to another office/provider.  Advised pt that if we could not pick up, to leave detailed msg in triage box.

## 2022-01-02 NOTE — Telephone Encounter (Signed)
Per review of EPIC, patient scheduled with Dr. Tarri Glenn on 02/12/22.   Routing to Dr. Antony Blackbird.   Encounter closed.   Cc: Wyatt Portela

## 2022-01-14 ENCOUNTER — Other Ambulatory Visit: Payer: Self-pay

## 2022-01-14 ENCOUNTER — Ambulatory Visit: Payer: Medicare Other | Attending: Family Medicine | Admitting: Physical Therapy

## 2022-01-14 ENCOUNTER — Encounter: Payer: Self-pay | Admitting: Physical Therapy

## 2022-01-14 DIAGNOSIS — R279 Unspecified lack of coordination: Secondary | ICD-10-CM | POA: Diagnosis not present

## 2022-01-14 DIAGNOSIS — M6281 Muscle weakness (generalized): Secondary | ICD-10-CM | POA: Diagnosis not present

## 2022-01-14 DIAGNOSIS — N812 Incomplete uterovaginal prolapse: Secondary | ICD-10-CM | POA: Diagnosis not present

## 2022-01-14 DIAGNOSIS — R293 Abnormal posture: Secondary | ICD-10-CM | POA: Diagnosis not present

## 2022-01-14 DIAGNOSIS — R350 Frequency of micturition: Secondary | ICD-10-CM | POA: Diagnosis not present

## 2022-01-14 NOTE — Patient Instructions (Addendum)

## 2022-01-14 NOTE — Therapy (Signed)
OUTPATIENT PHYSICAL THERAPY FEMALE PELVIC EVALUATION   Patient Name: Wendy Jackson MRN: [id number] DOB:[birth year], 77 y.o., female Today's Date: 01/14/2022  END OF SESSION:  PT End of Session - 01/14/22 1425     Visit Number 1    Date for PT Re-Evaluation 04/15/22    Authorization Type BCBS MCR    PT Start Time 1445    PT Stop Time 1523    PT Time Calculation (min) 38 min    Activity Tolerance Patient tolerated treatment well    Behavior During Therapy [facility] for tasks assessed/performed             Past Medical History:  Diagnosis Date   Allergy Spring 2010   Cataract 03/2019   Glaucoma Approximately 2005   Horseshoe retinal tear of left eye 04/02/2021   Hyperlipidemia LDL goal <70 01/11/2021   Hypertension 2022   Long-term use of Plaquenil 04/02/2021   Lupus ([place])    new rheum questioning original dx. continuing Plaquenil at lower dose for now.   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 bladder, acquired    Pseudophakia of both eyes 04/02/2021   Retinal detachment with multiple breaks, right eye 04/02/2021   Stroke ([place]) 04/2020   Thyroid disease    Past Surgical History:  Procedure Laterality Date   ENDARTERECTOMY Right 05/02/2020   Procedure: RIGHT CAROTID ENDARTERECTOMY;  Surgeon: Rosetta Posner, MD;  Location: [facility];  Service: Vascular;  Laterality: Right;   EYE SURGERY  01/2020   SCLERAL BUCKLE Right 2004   TUBAL LIGATION  Feb 1982   Patient Active Problem List   Diagnosis Date Noted   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   Other female genital prolapse 10/29/2021   Immunization not carried out because of parent refusal 05/06/2021   Long-term use of Plaquenil 04/02/2021   Primary open angle glaucoma (POAG) of both eyes, severe stage 04/02/2021   White coat syndrome with diagnosis of hypertension 01/11/2021   Hyperlipidemia LDL goal <70 01/11/2021   Acquired thrombophilia ([place]) 12/11/2020   BMI less than 19,adult 12/11/2020    Hypothyroidism 12/11/2020   History of CVA (cerebrovascular accident) 05/02/2020   Lupus ([place])    Allergic rhinitis     PCP: [name], DO  REFERRING PROVIDER: Nunzio Cobbs, MD   REFERRING DIAG: N81.2 (ICD-10-CM) - Incomplete uterovaginal prolapse R35.0 (ICD-10-CM) - Urinary frequency  THERAPY DIAG:  Muscle weakness (generalized)  Abnormal posture  Unspecified lack of coordination  Rationale for Evaluation and Treatment: Rehabilitation  ONSET DATE: July 2023  SUBJECTIVE:  SUBJECTIVE STATEMENT: Pt reports she feels her prolapse has gotten a little worse since seeing the doctor. At the end of the day more notable and feels lower, now starts to feel earlier in the day.   Does have a little urinary frequency in the morning but drinks coffee, tea, water fairly close together.     PAIN:  Are you having pain? No   PRECAUTIONS: None  WEIGHT BEARING RESTRICTIONS: No  FALLS:  Has patient fallen in last 6 months? No  LIVING ENVIRONMENT: Lives with: lives with their family Lives in: House/apartment   OCCUPATION: retired   PLOF: Independent  PATIENT GOALS: to have decreased prolapse symptoms  PERTINENT HISTORY:  Stroke, thyroid disease, ENDARTERECTOMY, lupus Sexual abuse: No  BOWEL MOVEMENT: Pain with bowel movement: No Type of bowel movement:Type (Bristol Stool Scale) 6-5, Frequency usually 1-2x per day has slowed a little in recently to once per day, and Strain No (not usually sometimes) Fully empty rectum: No not always Leakage: Yes: unaware most of the time but notices it sometimes after bowel movement  Pads: No Fiber supplement: No  URINATION: Pain with urination: No Fully empty bladder: Yes:   Stream: Strong Urgency: Yes: mostly at night Frequency: in  am may go quicker than every 2 hours, night 1-2x  Leakage:  only very occasionally with nighttime urgency  Pads: No  INTERCOURSE: Pain with intercourse:  not active  PREGNANCY: Vaginal deliveries 2   PROLAPSE: Cystocele per pt, pressure felt vaginally, bulge felt there as well. Straining with voiding if she attempts she will feel it the most and sometimes with lifting.    OBJECTIVE:   DIAGNOSTIC FINDINGS:    COGNITION: Overall cognitive status: Within functional limits for tasks assessed     SENSATION: Light touch: Appears intact Proprioception: Appears intact  MUSCLE LENGTH: Bil hamstrings and adductors limited by 25%   POSTURE: rounded shoulders, forward head, and posterior pelvic tilt    LUMBARAROM/PROM:  A/PROM A/PROM  eval  Flexion Limited by 25%  Extension WFL  Right lateral flexion Limited by 25%  Left lateral flexion Limited by 25%  Right rotation Limited by 25%  Left rotation Limited by 25%   (Blank rows = not tested)  LOWER EXTREMITY ROM:  WFL  LOWER EXTREMITY MMT:  Bil hips grossly 4/5, knees and ankles 5/5   PALPATION:   General  no TTP but did have fascial restrictions throughout abdomen                 External Perineal Exam dryness noted at vulva, tissue whitening                             Internal Pelvic Floor no pain, WFL  Patient confirms identification and approves PT to assess internal pelvic floor and treatment Yes  PELVIC MMT:   MMT eval  Vaginal 2/5, 3s, 3 reps  Internal Anal Sphincter   External Anal Sphincter   Puborectalis   Diastasis Recti   (Blank rows = not tested)        TONE: WFL  PROLAPSE: In hooklying possible grade 2 anterior wall laxity noted with cough  TODAY'S TREATMENT:  DATE:   01/14/22 EVAL Examination completed, findings reviewed, pt educated on POC, HEP, and bladder  irritants, step stool for emptying. Pt motivated to participate in PT and agreeable to attempt recommendations.     PATIENT EDUCATION:  Education details: Youth worker Person educated: Patient Education method: Education officer, environmental, Corporate treasurer cues, Verbal cues, and Handouts Education comprehension: verbalized understanding and returned demonstration  HOME EXERCISE PROGRAM: [id number]  ASSESSMENT:  CLINICAL IMPRESSION: Patient is a 77 y.o. female  who was seen today for physical therapy evaluation and treatment for prolapse of anterior vaginal wall, urinary frequency. Pt reports she is most bothered by prolapse symptoms at this time. Pt has worse symptoms at end of the day. Pt found to have decreased spinal flexibility, decreased hip flexibility, decreased core and hip strength. Pt consented to internal vaginal assessment this date and found to have decreased strength, coordination, and endurance and did have dryness noted externally at vulva, tissue whitening at vaginal opening but denied all pain. Pt also demonstrated possible grade 2 anterior wall laxity with coughing in hooklying. Pt given handouts for HEP and bladder irritants, and voiding mechanics for step stool use to improve fully emptying. Pt reports she does have fecal leakage sometimes with her being unaware of it until she wipes or will urinate and have some stool in toilet. Pt would benefit from additional PT to further address deficits.    OBJECTIVE IMPAIRMENTS: decreased coordination, decreased endurance, decreased mobility, difficulty walking, decreased strength, increased fascial restrictions, impaired flexibility, improper body mechanics, and postural dysfunction.   ACTIVITY LIMITATIONS: lifting, standing, and continence  PARTICIPATION LIMITATIONS: community activity  PERSONAL FACTORS: Fitness and Time since onset of injury/illness/exacerbation are also affecting patient's functional outcome.   REHAB POTENTIAL:  Good  CLINICAL DECISION MAKING: Stable/uncomplicated  EVALUATION COMPLEXITY: Low   GOALS: Goals reviewed with patient? Yes  SHORT TERM GOALS: Target date: 02/11/22  Pt to be I with HEP.  Baseline: Goal status: INITIAL  2.  Pt will report her BMs are complete at least 50% of the time due to improved bowel habits and evacuation techniques.  Baseline:  Goal status: INITIAL  3.  Pt to demonstrate at least 3/5 pelvic floor strength for improved pelvic stability and decreased strain at pelvic floor/ decrease leakage.  Baseline:  Goal status: INITIAL  4.  Pt will have 75% less urgency due to bladder retraining and strengthening  Baseline:  Goal status: INITIAL   LONG TERM GOALS: Target date: 04/15/22  Pt to be I with advanced HEP.  Baseline:  Goal status: INITIAL  2.  Pt to demonstrate at least 5/5 bil hip strength for improved pelvic stability and functional squats without leakage.  Baseline:  Goal status: INITIAL  3.  Pt to demonstrate at least 4/5 pelvic floor strength for improved pelvic stability and decreased strain at pelvic floor/ decrease leakage.  Baseline:  Goal status: INITIAL  4.  Pt to be I with prolapse relief positions, breathing mechanics and voiding mechanics to decrease strain at pelvic floor and prolapse without cues.  Baseline:  Goal status: INITIAL  5.   Pt to demonstrate improved coordination of pelvic floor and breathing mechanics with 10# squats to decreased strain at pelvic floor and prolapse Baseline:  Goal status: INITIAL   PLAN:  PT FREQUENCY: 1x/week  PT DURATION:  8 sessions  PLANNED INTERVENTIONS: Therapeutic exercises, Therapeutic activity, Neuromuscular re-education, Patient/Family education, Self Care, Joint mobilization, Dry Needling, Electrical stimulation, Cryotherapy, Moist heat, scar mobilization, Taping, Biofeedback, and Manual therapy  PLAN  FOR NEXT SESSION: internal as needed and pt consents, breathing mechanics, voiding  mechanics, prolapse relief positions, core/hip/pelvic floor strengthening and coordination   Stacy Gardner, PT, DPT 01/02/244:57 PM

## 2022-01-22 ENCOUNTER — Encounter: Payer: Medicare Other | Admitting: Physical Therapy

## 2022-01-28 DIAGNOSIS — K080 Exfoliation of teeth due to systemic causes: Secondary | ICD-10-CM | POA: Diagnosis not present

## 2022-01-29 ENCOUNTER — Ambulatory Visit: Payer: Medicare Other | Admitting: Physical Therapy

## 2022-01-29 ENCOUNTER — Encounter: Payer: Self-pay | Admitting: Physical Therapy

## 2022-01-29 DIAGNOSIS — R293 Abnormal posture: Secondary | ICD-10-CM

## 2022-01-29 DIAGNOSIS — M6281 Muscle weakness (generalized): Secondary | ICD-10-CM | POA: Diagnosis not present

## 2022-01-29 DIAGNOSIS — N812 Incomplete uterovaginal prolapse: Secondary | ICD-10-CM | POA: Diagnosis not present

## 2022-01-29 DIAGNOSIS — R279 Unspecified lack of coordination: Secondary | ICD-10-CM

## 2022-01-29 DIAGNOSIS — R350 Frequency of micturition: Secondary | ICD-10-CM | POA: Diagnosis not present

## 2022-01-29 NOTE — Therapy (Signed)
OUTPATIENT PHYSICAL THERAPY TREATMENT NOTE   Patient Name: Wendy Jackson MRN: [id number] DOB:[birth year], 77 y.o., female Today's Date: 01/29/2022  PCP: [name], DO  REFERRING PROVIDER: Nunzio Cobbs, MD    END OF SESSION:   PT End of Session - 01/29/22 1101     Visit Number 2    Date for PT Re-Evaluation 04/15/22    Authorization Type BC[employer] MCR    Authorization - Visit Number 1    Authorization - Number of Visits 10    PT Start Time 1100    PT Stop Time 1140    PT Time Calculation (min) 40 min    Activity Tolerance Patient tolerated treatment well    Behavior During Therapy [facility] for tasks assessed/performed             Past Medical History:  Diagnosis Date   Allergy Spring 2010   Cataract 03/2019   Glaucoma Approximately 2005   [place] retinal tear of left eye 04/02/2021   Hyperlipidemia LDL goal <70 01/11/2021   Hypertension 2022   Long-term use of Plaquenil 04/02/2021   Lupus ([place])    new rheum questioning original dx. continuing Plaquenil at lower dose for now.   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 bladder, acquired    Pseudophakia of both eyes 04/02/2021   Retinal detachment with multiple breaks, right eye 04/02/2021   Stroke [place] [facility] [place]) 04/2020   Thyroid disease    Past Surgical History:  Procedure Laterality Date   ENDARTERECTOMY Right 05/02/2020   Procedure: RIGHT CAROTID ENDARTERECTOMY;  Surgeon: Rosetta Posner, MD;  Location: [facility] OR;  Service: Vascular;  Laterality: Right;   EYE SURGERY  01/2020   SCLERAL BUCKLE Right 2004   TUBAL LIGATION  Feb 1982   Patient Active Problem List   Diagnosis Date Noted   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   Other female genital prolapse 10/29/2021   Immunization not carried out because of parent refusal 05/06/2021   Long-term use of Plaquenil 04/02/2021   Primary open angle glaucoma (POAG) of both eyes, severe stage 04/02/2021   White coat syndrome with diagnosis of  hypertension 01/11/2021   Hyperlipidemia LDL goal <70 01/11/2021   Acquired thrombophilia ([place]) 12/11/2020   BMI less than 19,adult 12/11/2020   Hypothyroidism 12/11/2020   History of CVA (cerebrovascular accident) 05/02/2020   Lupus ([place])    Allergic rhinitis    REFERRING DIAG: N81.2 (ICD-10-CM) - Incomplete uterovaginal prolapse R35.0 (ICD-10-CM) - Urinary frequency   THERAPY DIAG:  Muscle weakness (generalized)   Abnormal posture   Unspecified lack of coordination   Rationale for Evaluation and Treatment: Rehabilitation   ONSET DATE: July 2023   SUBJECTIVE:  SUBJECTIVE STATEMENT: I have been doing the pelvic floor exercises.    Does have a little urinary frequency in the morning but drinks coffee, tea, water fairly close together.       PAIN:  Are you having pain? No     PRECAUTIONS: None   WEIGHT BEARING RESTRICTIONS: No   FALLS:  Has patient fallen in last 6 months? No   LIVING ENVIRONMENT: Lives with: lives with their family Lives in: House/apartment     OCCUPATION: retired    PLOF: Independent   PATIENT GOALS: to have decreased prolapse symptoms   PERTINENT HISTORY:  Stroke, thyroid disease, ENDARTERECTOMY, lupus Sexual abuse: No   BOWEL MOVEMENT: Pain with bowel movement: No Type of bowel movement:Type (Bristol Stool Scale) 6-5, Frequency usually 1-2x per day has slowed a little in recently to once per day, and Strain No (not usually sometimes) Fully empty rectum: No not always Leakage: Yes: unaware most of the time but notices it sometimes after bowel movement  Pads: No Fiber supplement: No   URINATION: Pain with urination: No Fully empty bladder: Yes:   Stream: Strong Urgency: Yes: mostly at night Frequency: in am may go quicker than every 2 hours, night  1-2x  Leakage:  only very occasionally with nighttime urgency  Pads: No   INTERCOURSE: Pain with intercourse:  not active   PREGNANCY: Vaginal deliveries 2     PROLAPSE: Cystocele per pt, pressure felt vaginally, bulge felt there as well. Straining with voiding if she attempts she will feel it the most and sometimes with lifting.      OBJECTIVE:    DIAGNOSTIC FINDINGS:      COGNITION: Overall cognitive status: Within functional limits for tasks assessed                          SENSATION: Light touch: Appears intact Proprioception: Appears intact   MUSCLE LENGTH: Bil hamstrings and adductors limited by 25%     POSTURE: rounded shoulders, forward head, and posterior pelvic tilt       LUMBARAROM/PROM:   A/PROM A/PROM  eval  Flexion Limited by 25%  Extension WFL  Right lateral flexion Limited by 25%  Left lateral flexion Limited by 25%  Right rotation Limited by 25%  Left rotation Limited by 25%   (Blank rows = not tested)   LOWER EXTREMITY ROM:   WFL   LOWER EXTREMITY MMT:   Bil hips grossly 4/5, knees and ankles 5/5    PALPATION:   General  no TTP but did have fascial restrictions throughout abdomen                  External Perineal Exam dryness noted at vulva, tissue whitening                             Internal Pelvic Floor no pain, WFL   Patient confirms identification and approves PT to assess internal pelvic floor and treatment Yes   PELVIC MMT:   MMT eval  Vaginal 2/5, 3s, 3 reps          TONE: WFL   PROLAPSE: In hooklying possible grade 2 anterior wall laxity noted with cough   TODAY'S TREATMENT:    01/29/22 Neuromuscular re-education: Pelvic floor contraction training: Supine with legs elevated squeeze ball holding 5 sec with pelvic floor contraction Supine clam with red band and pelvic floor contraction  Bridges 10x  Educated patient on how to moisturize the vaginal tissue and massage to improve blood flow and health of tissue  to assist with contraction.  Educated patient on management of prolapse and what it is so she understands why she is doing the exercises.  Down training: Supine with legs elevated and diaphragmatic breathing with therapist guiding the lower rib cage                                                                                                                              DATE:   01/14/22 EVAL Examination completed, findings reviewed, pt educated on POC, HEP, and bladder irritants, step stool for emptying. Pt motivated to participate in PT and agreeable to attempt recommendations.       PATIENT EDUCATION:  01/29/22 Education details: Ardeth Perfect, educated on vaginal moisturizers, how to manage prolapse Person educated: Patient Education method: Explanation, Demonstration, Tactile cues, Verbal cues, and Handouts Education comprehension: verbalized understanding and returned demonstration   HOME EXERCISE PROGRAM: 01/29/22 Access Code: [email or web address] URL: [email or web address] Date: 01/29/2022 Prepared by: Earlie Counts  Exercises - Supine Diaphragmatic Breathing  - 1 x daily - 7 x weekly - 3 sets - 10 reps - Supine Pelvic Floor Contraction  - 1 x daily - 7 x weekly - 3 sets - 10 reps - Diaphragmatic Breathing with Hips Elevated (for Pelvic Organ Prolapse)  - 2 x daily - 7 x weekly - 1 sets - 10 reps - Pelvic Floor Muscle Contraction and Adductor Squeeze With Hips Elevated (for Pelvic Organ Prolapse)  - 2 x daily - 7 x weekly - 1 sets - 10 reps - 5 sec hold - Pelvic Floor Muscle Contraction With Resisted Abduction With Hips Elevated (for Pelvic Organ Prolapse)  - 2 x daily - 7 x weekly - 1 sets - 10 reps - Supine Bridge with Pelvic Floor Contraction  - 1 x daily - 7 x weekly - 1 sets - 10 reps   ASSESSMENT:   CLINICAL IMPRESSION: Patient is a 77 y.o. female  who was seen today for physical therapy  treatment for prolapse of anterior vaginal wall, urinary frequency.  Patient  understands why she is doing the exercises in supine to reduce her prolapse. She is using the stool for toileting and it is helping. Patient understands constipation can put pressure on the prolapse. Pt would benefit from additional PT to further address deficits.     OBJECTIVE IMPAIRMENTS: decreased coordination, decreased endurance, decreased mobility, difficulty walking, decreased strength, increased fascial restrictions, impaired flexibility, improper body mechanics, and postural dysfunction.    ACTIVITY LIMITATIONS: lifting, standing, and continence   PARTICIPATION LIMITATIONS: community activity   PERSONAL FACTORS: Fitness and Time since onset of injury/illness/exacerbation are also affecting patient's functional outcome.    REHAB POTENTIAL: Good   CLINICAL DECISION MAKING: Stable/uncomplicated   EVALUATION COMPLEXITY: Low     GOALS: Goals reviewed with patient? Yes   SHORT TERM GOALS: Target date: 02/11/22  Pt to be I with HEP.  Baseline: Goal status: INITIAL   2.  Pt will report her BMs are complete at least 50% of the time due to improved bowel habits and evacuation techniques.  Baseline:  Goal status: INITIAL   3.  Pt to demonstrate at least 3/5 pelvic floor strength for improved pelvic stability and decreased strain at pelvic floor/ decrease leakage.  Baseline:  Goal status: INITIAL   4.  Pt will have 75% less urgency due to bladder retraining and strengthening  Baseline:  Goal status: INITIAL     LONG TERM GOALS: Target date: 04/15/22   Pt to be I with advanced HEP.  Baseline:  Goal status: INITIAL   2.  Pt to demonstrate at least 5/5 bil hip strength for improved pelvic stability and functional squats without leakage.  Baseline:  Goal status: INITIAL   3.  Pt to demonstrate at least 4/5 pelvic floor strength for improved pelvic stability and decreased strain at pelvic floor/ decrease leakage.  Baseline:  Goal status: INITIAL   4.  Pt to be I with  prolapse relief positions, breathing mechanics and voiding mechanics to decrease strain at pelvic floor and prolapse without cues.  Baseline:  Goal status: INITIAL   5.   Pt to demonstrate improved coordination of pelvic floor and breathing mechanics with 10# squats to decreased strain at pelvic floor and prolapse Baseline:  Goal status: INITIAL     PLAN:   PT FREQUENCY: 1x/week   PT DURATION:  8 sessions   PLANNED INTERVENTIONS: Therapeutic exercises, Therapeutic activity, Neuromuscular re-education, Patient/Family education, Self Care, Joint mobilization, Dry Needling, Electrical stimulation, Cryotherapy, Moist heat, scar mobilization, Taping, Biofeedback, and Manual therapy   PLAN FOR NEXT SESSION: internal as needed and pt consents, breathing mechanics, voiding mechanics,  core/hip/pelvic floor strengthening and coordination ,urge to void   Earlie Counts, PT 01/29/22 11:46 AM

## 2022-01-29 NOTE — Patient Instructions (Addendum)
Moisturizers They are used in the vagina to hydrate the mucous membrane that make up the vaginal canal. Designed to keep a more normal acid balance (ph) Once placed in the vagina, it will last between two to three days.  Use 2-3 times per week at bedtime  Ingredients to avoid is glycerin and fragrance, can increase chance of infection Should not be used just before sex due to causing irritation Most are gels administered either in a tampon-shaped applicator or as a vaginal suppository. They are non-hormonal.   Types of Moisturizers(internal use)  Vitamin E vaginal suppositories- Whole foods, Amazon Moist Again Coconut oil- can break down condoms Julva- (Do no use if on Tamoxifen) amazon Yes moisturizer- amazon NeuEve Silk , NeuEve Silver for menopausal or over 65 (if have severe vaginal atrophy or cancer treatments use NeuEve Silk for  1 month than move to [employer])- [employer], [employer] [place].com Olive and Bee intimate cream- www.oliveandbee.com.au Mae vaginal moisturizer- Amazon Aloe Good Clean Love Hyaluronic acid Hyalofemme replens   Creams to use externally on the Vulva area Alber[employer]on's (good for for cancer patients that had radiation to the area)- [place] or [employer].FlyingBasics.com.br V-magic cream - amazon Julva-amazon Vital "V [place] salve ( help moisturize and help with thinning vulvar area, does have [place] by Irwin Brakeman labial moisturizer (Amazon,  Coconut or olive oil aloe Good Clean Love Enchanted Rose by intimate rose  Things to avoid in the vaginal area Do not use things to irritate the vulvar area No lotions just specialized creams for the vulva area- Neogyn, V-magic, No soaps; can use Aveeno or Calendula cleanser if needed. Must be gentle No deodorants No douches Good to sleep without underwear to let the vaginal area to air out No scrubbing: spread the lips to let warm water rinse  over labias and pat dry   About Pelvic Support Problems Pelvic Support Problems Explained Ligaments, muscles, and connective tissue normally hold your bladder, uterus, and other organs in their proper places in your pelvis. When these tissues become weak, a problem with pelvic support may result. Weak support can cause one or more of the pelvic organs to drop down into the vagina. An organ may even drop so far that is partially exposed outside the body.  Pelvic support problems are named by the change in the organ. The main types of pelvic support problems are:  Cystocele: When the bladder drops down into your vagina.  Enterocele: When your small intestine drops between your vagina and rectum.  Rectocele: When your rectum bulges into the vaginal wall.  Uterine prolapse: When your uterus drops into your vagina.  Vaginal prolapse: When the top part of the vagina begins to droop. This sometimes happens after a hysterectomy (removal of the uterus).  Causes Pelvic support problems can be caused by many conditions. They may begin after you give birth, especially if you had a large baby. During childbirth, the muscles and skin of the birth canal (vagina) are stretched and sometimes torn. They heal over time but are not always exactly the same. A long pushing stage of labor may also weaken these tissues as well as very rapid births as the tissues do not have time to stretch so they tear.  Also, after menopause, there are changes in the vaginal walls resulting from a decrease in estrogen. Estrogen helps to keep the tissues toned. Low levels of estrogen weaken the vaginal walls and may cause the  bladder to shift from its normal position. As women get older, the loss of muscle tone and the relaxation of muscles may cause the uterus or other organs to drop.  Over time, conditions like chronic coughing, chronic constipation, doing a lot of heavy lifting, straining to pass stool, and obesity, can also weaken the  pelvic support muscles.  Diagnosing Pelvic Support Problems Your health care provider will ask about your symptoms and do a pelvic examination. Your provider may also do a rectal exam during your pelvic exam. Your provider may ask you to: 1. Bear down and push (like you are having a bowel movement) so he or she can see if your bladder or other part of your body protrudes into the vagina. 2. Contract the muscles of your pelvis to check the strength of your pelvic muscles.  3. Do several types of urine, nerve and muscle tests of the pelvis and around the bladder to see what type of treatment is best for you.   Symptoms Symptoms of pelvic support problems depend on the organ involved, but may include:  urine leakage  stain or fecal loss after a bowel movement trouble having bowel movements  ache in the lower abdomen, groin, or lower back  bladder infection  a feeling of heaviness, pulling, or fullness in the pelvis, or a feeling that something is falling out of the vagina  an organ protruding from your vaginal opening  feeling the need to support the organs or perineal area to empty bladder or bowels painful sexual intercourse.  Many women feel pelvic pressure or trouble holding their urine immediately after childbirth. For some, these symptoms go away permanently, in others they return as they get older.  Treatment Options A prolapsed organ cannot repair itself. Contact your health care provider as soon as you notice symptoms of a problem. Treatment depends on what the specific problem is and how far advanced it is.  The symptoms caused by some pelvic support problems may simply be treated with changes in diet, medicine to soften the stool, weight loss, or avoiding strenuous activities. You may also do pelvic floor exercises to help strengthen your pelvic muscles.  Some cases of prolapse may require a special support device made from plastic or rubber called a pessary that fits into the vagina to  support the uterus, vagina, or bladder. A pessary can also help women who leak urine when coughing, straining, or exercising. In mild cases, a tampon or vaginal diaphragm may be used instead of a pessary.  Talk to your doctor or health care provider about these options. In serious cases, surgery may be needed to put the organs back into their proper place. The uterus may be removed because of the pressure it puts on the bladder.  Your doctor will know what surgery will be best for you. How can I prevent pelvic support problems?  You can help prevent pelvic support problems by:  maintaining a healthy lifestyle  continuing to do pelvic floor exercises after you deliver a baby  maintaining a healthy weight  avoiding a lot of heavy lifting and lifting with your legs (not from your waist)  treating constipation and avoid getting   About Cystocele Overview The pelvic organs, including the bladder, are normally supported by pelvic floor muscles and ligaments.   When these muscles and ligaments are stretched, weakened or torn, the wall between the bladder and the vagina sags or herniates causing a prolapse, sometimes called a cystocele. This condition may cause  discomfort and problems with emptying the bladder.  It can be present in various stages.  Some people are not aware of the changes. Others may notice changes at the vaginal opening or a feeling of the bladder dropping outside the body. Causes of a Cystocele A cystocele is usually caused by muscle straining or stretching during childbirth.  In addition, cystocele is more common after menopause, because the hormone estrogen helps keep the elastic tissues around the pelvic organs strong.  A cystocele is more likely to occur when levels of estrogen decrease. Other causes include: heavy lifting, chronic coughing, previous pelvic surgery and obesity.  Symptoms A bladder that has dropped from its normal position may cause: unwanted urine leakage (stress  incontinence), frequent urination or urge to urinate, incomplete emptying of the bladder (not feeling bladder relief after emptying), pain or discomfort in the vagina, pelvis, groin, lower back or lower abdomen and frequent urinary tract infections.  Mild cases may not cause any symptoms.  Treatment Options Pelvic floor (Kegel) exercises: Strength training the muscles in your genital area  Behavioral changes: Treating and preventing constipation, taking time to empty your bladder properly, learning to lift properly and/or  avoid heavy lifting when possible, stopping smoking, avoiding weight gain and treating a chronic cough or bronchitis. A pessary: A vaginal support device is sometimes used to help pelvic support caused by muscle and ligament changes. Surgery: Aurgical repair may be necessary if symptoms cannot be managed with exercise, behavioral changes and a pessary. Surgery is usually considered for severe cases. [facility] [address], [place], [place] [address] Phone # [phone] Fax [phone]

## 2022-02-03 ENCOUNTER — Ambulatory Visit: Payer: Medicare Other | Admitting: Physical Therapy

## 2022-02-03 ENCOUNTER — Encounter: Payer: Self-pay | Admitting: Physical Therapy

## 2022-02-03 DIAGNOSIS — R293 Abnormal posture: Secondary | ICD-10-CM | POA: Diagnosis not present

## 2022-02-03 DIAGNOSIS — R279 Unspecified lack of coordination: Secondary | ICD-10-CM

## 2022-02-03 DIAGNOSIS — M6281 Muscle weakness (generalized): Secondary | ICD-10-CM | POA: Diagnosis not present

## 2022-02-03 DIAGNOSIS — N812 Incomplete uterovaginal prolapse: Secondary | ICD-10-CM | POA: Diagnosis not present

## 2022-02-03 DIAGNOSIS — R350 Frequency of micturition: Secondary | ICD-10-CM | POA: Diagnosis not present

## 2022-02-03 NOTE — Patient Instructions (Addendum)
Urge Incontinence  Ideal urination frequency is every 2-4 wakeful hours, which equates to 5-8 times within a 24-hour period.   Urge incontinence is leakage that occurs when the bladder muscle contracts, creating a sudden need to go before getting to the bathroom.   Going too often when your bladder isn't actually full can disrupt the body's automatic signals to store and hold urine longer, which will increase urgency/frequency.  In this case, the bladder "is running the show" and strategies can be learned to retrain this pattern.   One should be able to control the first urge to urinate, at around 159m.  The bladder can hold up to a "grande latte," or 4050m To help you gain control, practice the Urge Drill below when urgency strikes.  This drill will help retrain your bladder signals and allow you to store and hold urine longer.  The overall goal is to stretch out your time between voids to reach a more manageable voiding schedule.    Practice your "quick flicks" often throughout the day (each waking hour) even when you don't need feel the urge to go.  This will help strengthen your pelvic floor muscles, making them more effective in controlling leakage.  Urge Drill  When you feel an urge to go, follow these steps to regain control: Stop what you are doing and be still Take one deep breath, directing your air into your abdomen Think an affirming thought, such as "I've got this." Do 5 quick flicks of your pelvic floor Heel raises 5 times with heels coming down hard on the floor Walk with control to the bathroom to void, or delay voiding As you are walking to the bathroom and get the urge then repeat above.  [id number] [id number] # [phone]

## 2022-02-03 NOTE — Therapy (Signed)
OUTPATIENT PHYSICAL THERAPY TREATMENT NOTE   Patient Name: Wendy Jackson MRN: [id number] DOB:1945/03/08, 77 y.o., female Today's Date: 02/03/2022  PCP: [name], DO  REFERRING PROVIDER: Nunzio Cobbs, MD   END OF SESSION:   PT End of Session - 02/03/22 1444     Visit Number 3    Date for PT Re-Evaluation 04/15/22    Authorization Type BCBS MCR    Authorization - Visit Number 2    Authorization - Number of Visits 10    PT Start Time 1829    PT Stop Time 1525    PT Time Calculation (min) 40 min    Activity Tolerance Patient tolerated treatment well    Behavior During Therapy [facility] for tasks assessed/performed             Past Medical History:  Diagnosis Date   Allergy Spring 2010   Cataract 03/2019   Glaucoma Approximately 2005   Horseshoe retinal tear of left eye 04/02/2021   Hyperlipidemia LDL goal <70 01/11/2021   Hypertension 2022   Long-term use of Plaquenil 04/02/2021   Lupus ([place])    new rheum questioning original dx. continuing Plaquenil at lower dose for now.   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 bladder, acquired    Pseudophakia of both eyes 04/02/2021   Retinal detachment with multiple breaks, right eye 04/02/2021   Stroke ([place]) 04/2020   Thyroid disease    Past Surgical History:  Procedure Laterality Date   ENDARTERECTOMY Right 05/02/2020   Procedure: RIGHT CAROTID ENDARTERECTOMY;  Surgeon: Rosetta Posner, MD;  Location: [facility] OR;  Service: Vascular;  Laterality: Right;   EYE SURGERY  01/2020   SCLERAL BUCKLE Right 2004   TUBAL LIGATION  Feb 1982   Patient Active Problem List   Diagnosis Date Noted   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   Other female genital prolapse 10/29/2021   Immunization not carried out because of parent refusal 05/06/2021   Long-term use of Plaquenil 04/02/2021   Primary open angle glaucoma (POAG) of both eyes, severe stage 04/02/2021   White coat syndrome with diagnosis of  hypertension 01/11/2021   Hyperlipidemia LDL goal <70 01/11/2021   Acquired thrombophilia ([place]) 12/11/2020   BMI less than 19,adult 12/11/2020   Hypothyroidism 12/11/2020   History of CVA (cerebrovascular accident) 05/02/2020   Lupus ([place])    Allergic rhinitis    REFERRING DIAG: N81.2 (ICD-10-CM) - Incomplete uterovaginal prolapse R35.0 (ICD-10-CM) - Urinary frequency   THERAPY DIAG:  Muscle weakness (generalized)   Abnormal posture   Unspecified lack of coordination   Rationale for Evaluation and Treatment: Rehabilitation   ONSET DATE: July 2023   SUBJECTIVE:  SUBJECTIVE STATEMENT: I had company so not able to do the exercises.    Does have a little urinary frequency in the morning but drinks coffee, tea, water fairly close together.       PAIN:  Are you having pain? No     PRECAUTIONS: None   WEIGHT BEARING RESTRICTIONS: No   FALLS:  Has patient fallen in last 6 months? No   LIVING ENVIRONMENT: Lives with: lives with their family Lives in: House/apartment     OCCUPATION: retired    PLOF: Independent   PATIENT GOALS: to have decreased prolapse symptoms   PERTINENT HISTORY:  Stroke, thyroid disease, ENDARTERECTOMY, lupus Sexual abuse: No   BOWEL MOVEMENT: Pain with bowel movement: No Type of bowel movement:Type (Bristol Stool Scale) 6-5, Frequency usually 1-2x per day has slowed a little in recently to once per day, and Strain No (not usually sometimes) Fully empty rectum: No not always Leakage: Yes: unaware most of the time but notices it sometimes after bowel movement  Pads: No Fiber supplement: No   URINATION: Pain with urination: No Fully empty bladder: Yes:   Stream: Strong Urgency: Yes: mostly at night Frequency: in am may go quicker than every 2 hours, night  1-2x  Leakage:  only very occasionally with nighttime urgency  Pads: No   INTERCOURSE: Pain with intercourse:  not active   PREGNANCY: Vaginal deliveries 2     PROLAPSE: Cystocele per pt, pressure felt vaginally, bulge felt there as well. Straining with voiding if she attempts she will feel it the most and sometimes with lifting.      OBJECTIVE:    DIAGNOSTIC FINDINGS:      COGNITION: Overall cognitive status: Within functional limits for tasks assessed                          SENSATION: Light touch: Appears intact Proprioception: Appears intact   MUSCLE LENGTH: Bil hamstrings and adductors limited by 25%     POSTURE: rounded shoulders, forward head, and posterior pelvic tilt       LUMBARAROM/PROM:   A/PROM A/PROM  eval  Flexion Limited by 25%  Extension WFL  Right lateral flexion Limited by 25%  Left lateral flexion Limited by 25%  Right rotation Limited by 25%  Left rotation Limited by 25%   (Blank rows = not tested)   LOWER EXTREMITY ROM:   WFL   LOWER EXTREMITY MMT:   Bil hips grossly 4/5, knees and ankles 5/5    PALPATION:   General  no TTP but did have fascial restrictions throughout abdomen                  External Perineal Exam dryness noted at vulva, tissue whitening                             Internal Pelvic Floor no pain, WFL   Patient confirms identification and approves PT to assess internal pelvic floor and treatment Yes   PELVIC MMT:   MMT eval  Vaginal 2/5, 3s, 3 reps          TONE: WFL   PROLAPSE: In hooklying possible grade 2 anterior wall laxity noted with cough   TODAY'S TREATMENT:    02/03/22 Neuromuscular re-education: Down training: Educated patient on the urge to void and how to deter. She was able to return demonstration.  Exercises: Strengthening: Nustep level 3 for 6  minutes while assessing patient.  Bridges 10x 2 with ball squeeze and pelvic floor contraction Supine clam with red band and pelvic floor  contraction with legs elevated 10 x 2 Supine with legs elevated squeeze ball holding 5 sec with pelvic floor contraction Sidely hip abduction with pelvic floor contraction 10 x 2 each side Prone hip extension 10 x 2 bil.      01/29/22 Neuromuscular re-education: Pelvic floor contraction training: Supine with legs elevated squeeze ball holding 5 sec with pelvic floor contraction Supine clam with red band and pelvic floor contraction  Bridges 10x Educated patient on how to moisturize the vaginal tissue and massage to improve blood flow and health of tissue to assist with contraction.  Educated patient on management of prolapse and what it is so she understands why she is doing the exercises.  Down training: Supine with legs elevated and diaphragmatic breathing with therapist guiding the lower rib cage                                                                                                                                  PATIENT EDUCATION:  01/29/22 Education details: Ardeth Perfect, educated on vaginal moisturizers, how to manage prolapse Person educated: Patient Education method: Explanation, Demonstration, Tactile cues, Verbal cues, and Handouts Education comprehension: verbalized understanding and returned demonstration   HOME EXERCISE PROGRAM: 01/29/22 Access Code: [email or web address] URL: [email or web address] Date: 01/29/2022 Prepared by: Earlie Counts   Exercises - Supine Diaphragmatic Breathing  - 1 x daily - 7 x weekly - 3 sets - 10 reps - Supine Pelvic Floor Contraction  - 1 x daily - 7 x weekly - 3 sets - 10 reps - Diaphragmatic Breathing with Hips Elevated (for Pelvic Organ Prolapse)  - 2 x daily - 7 x weekly - 1 sets - 10 reps - Pelvic Floor Muscle Contraction and Adductor Squeeze With Hips Elevated (for Pelvic Organ Prolapse)  - 2 x daily - 7 x weekly - 1 sets - 10 reps - 5 sec hold - Pelvic Floor Muscle Contraction With Resisted Abduction With Hips Elevated (for  Pelvic Organ Prolapse)  - 2 x daily - 7 x weekly - 1 sets - 10 reps - Supine Bridge with Pelvic Floor Contraction  - 1 x daily - 7 x weekly - 1 sets - 10 reps   ASSESSMENT:   CLINICAL IMPRESSION: Patient is a 77 y.o. female  who was seen today for physical therapy  treatment for prolapse of anterior vaginal wall, urinary frequency.  Urgency has improved by 50%. Patient is able to fully empty her rectum due to elevating her feet.  Patient needs tactile cues with hip abduction. Pt would benefit from additional PT to further address deficits.     OBJECTIVE IMPAIRMENTS: decreased coordination, decreased endurance, decreased mobility, difficulty walking, decreased strength, increased fascial restrictions, impaired flexibility, improper body mechanics, and postural dysfunction.    ACTIVITY LIMITATIONS: lifting, standing, and continence  PARTICIPATION LIMITATIONS: community activity   PERSONAL FACTORS: Fitness and Time since onset of injury/illness/exacerbation are also affecting patient's functional outcome.    REHAB POTENTIAL: Good   CLINICAL DECISION MAKING: Stable/uncomplicated   EVALUATION COMPLEXITY: Low     GOALS: Goals reviewed with patient? Yes   SHORT TERM GOALS: Target date: 02/11/22   Pt to be I with HEP.  Baseline: Goal status: Met 02/03/22   2.  Pt will report her BMs are complete at least 50% of the time due to improved bowel habits and evacuation techniques.  Baseline:  Goal status: met 02/03/22   3.  Pt to demonstrate at least 3/5 pelvic floor strength for improved pelvic stability and decreased strain at pelvic floor/ decrease leakage.  Baseline:  Goal status: INITIAL   4.  Pt will have 75% less urgency due to bladder retraining and strengthening  Baseline: improved by 50% Goal status: Ongoing 02/03/22     LONG TERM GOALS: Target date: 04/15/22   Pt to be I with advanced HEP.  Baseline:  Goal status: INITIAL   2.  Pt to demonstrate at least 5/5 bil hip  strength for improved pelvic stability and functional squats without leakage.  Baseline:  Goal status: INITIAL   3.  Pt to demonstrate at least 4/5 pelvic floor strength for improved pelvic stability and decreased strain at pelvic floor/ decrease leakage.  Baseline:  Goal status: INITIAL   4.  Pt to be I with prolapse relief positions, breathing mechanics and voiding mechanics to decrease strain at pelvic floor and prolapse without cues.  Baseline:  Goal status: INITIAL   5.   Pt to demonstrate improved coordination of pelvic floor and breathing mechanics with 10# squats to decreased strain at pelvic floor and prolapse Baseline:  Goal status: INITIAL     PLAN:   PT FREQUENCY: 1x/week   PT DURATION:  8 sessions   PLANNED INTERVENTIONS: Therapeutic exercises, Therapeutic activity, Neuromuscular re-education, Patient/Family education, Self Care, Joint mobilization, Dry Needling, Electrical stimulation, Cryotherapy, Moist heat, scar mobilization, Taping, Biofeedback, and Manual therapy   PLAN FOR NEXT SESSION: internal as needed and pt consents to check pelvic floor strength, breathing mechanics, core/hip/pelvic floor strengthening and coordination  Earlie Counts, PT 02/03/22 3:27 PM

## 2022-02-10 ENCOUNTER — Encounter: Payer: Self-pay | Admitting: Physical Therapy

## 2022-02-10 ENCOUNTER — Ambulatory Visit: Payer: Medicare Other | Admitting: Physical Therapy

## 2022-02-10 DIAGNOSIS — R350 Frequency of micturition: Secondary | ICD-10-CM | POA: Diagnosis not present

## 2022-02-10 DIAGNOSIS — R279 Unspecified lack of coordination: Secondary | ICD-10-CM

## 2022-02-10 DIAGNOSIS — R293 Abnormal posture: Secondary | ICD-10-CM

## 2022-02-10 DIAGNOSIS — N812 Incomplete uterovaginal prolapse: Secondary | ICD-10-CM | POA: Diagnosis not present

## 2022-02-10 DIAGNOSIS — M6281 Muscle weakness (generalized): Secondary | ICD-10-CM

## 2022-02-10 NOTE — Therapy (Signed)
OUTPATIENT PHYSICAL THERAPY TREATMENT NOTE   Patient Name: Wendy Jackson MRN: [id number] DOB:May 29, 1945, 77 y.o., female Today's Date: 02/10/2022  PCP: [name], DO  REFERRING PROVIDER: Nunzio Cobbs, MD   END OF SESSION:   PT End of Session - 02/10/22 1443     Visit Number 4    Date for PT Re-Evaluation 04/15/22    Authorization Type BCBS MCR    Authorization - Visit Number 3    Authorization - Number of Visits 10    PT Start Time 6294    PT Stop Time 7654    PT Time Calculation (min) 40 min    Activity Tolerance Patient tolerated treatment well    Behavior During Therapy [facility] for tasks assessed/performed             Past Medical History:  Diagnosis Date   Allergy Spring 2010   Cataract 03/2019   Glaucoma Approximately 2005   Horseshoe retinal tear of left eye 04/02/2021   Hyperlipidemia LDL goal <70 01/11/2021   Hypertension 2022   Long-term use of Plaquenil 04/02/2021   Lupus ([place])    new rheum questioning original dx. continuing Plaquenil at lower dose for now.   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 bladder, acquired    Pseudophakia of both eyes 04/02/2021   Retinal detachment with multiple breaks, right eye 04/02/2021   Stroke ([place]) 04/2020   Thyroid disease    Past Surgical History:  Procedure Laterality Date   ENDARTERECTOMY Right 05/02/2020   Procedure: RIGHT CAROTID ENDARTERECTOMY;  Surgeon: Rosetta Posner, MD;  Location: [facility] OR;  Service: Vascular;  Laterality: Right;   EYE SURGERY  01/2020   SCLERAL BUCKLE Right 2004   TUBAL LIGATION  Feb 1982   Patient Active Problem List   Diagnosis Date Noted   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   Other female genital prolapse 10/29/2021   Immunization not carried out because of parent refusal 05/06/2021   Long-term use of Plaquenil 04/02/2021   Primary open angle glaucoma (POAG) of both eyes, severe stage 04/02/2021   White coat syndrome with diagnosis of  hypertension 01/11/2021   Hyperlipidemia LDL goal <70 01/11/2021   Acquired thrombophilia ([place]) 12/11/2020   BMI less than 19,adult 12/11/2020   Hypothyroidism 12/11/2020   History of CVA (cerebrovascular accident) 05/02/2020   Lupus ([place])    Allergic rhinitis    REFERRING DIAG: N81.2 (ICD-10-CM) - Incomplete uterovaginal prolapse R35.0 (ICD-10-CM) - Urinary frequency   THERAPY DIAG:  Muscle weakness (generalized)   Abnormal posture   Unspecified lack of coordination   Rationale for Evaluation and Treatment: Rehabilitation   ONSET DATE: July 2023   SUBJECTIVE:  SUBJECTIVE STATEMENT: I had company so not able to do the exercises. I still feel the bulge. Using the stool helps me from straining to have a bowel movement.    Does have a little urinary frequency in the morning but drinks coffee, tea, water fairly close together.       PAIN:  Are you having pain? No     PRECAUTIONS: None   WEIGHT BEARING RESTRICTIONS: No   FALLS:  Has patient fallen in last 6 months? No   LIVING ENVIRONMENT: Lives with: lives with their family Lives in: House/apartment     OCCUPATION: retired    PLOF: Independent   PATIENT GOALS: to have decreased prolapse symptoms   PERTINENT HISTORY:  Stroke, thyroid disease, ENDARTERECTOMY, lupus Sexual abuse: No   BOWEL MOVEMENT: Pain with bowel movement: No Type of bowel movement:Type (Bristol Stool Scale) 6-5, Frequency usually 1-2x per day has slowed a little in recently to once per day, and Strain No (not usually sometimes) Fully empty rectum: No not always Leakage: Yes: unaware most of the time but notices it sometimes after bowel movement  Pads: No Fiber supplement: No   URINATION: Pain with urination: No Fully empty bladder: Yes:   Stream:  Strong Urgency: Yes: mostly at night Frequency: in am may go quicker than every 2 hours, night 1-2x  Leakage:  only very occasionally with nighttime urgency  Pads: No   INTERCOURSE: Pain with intercourse:  not active   PREGNANCY: Vaginal deliveries 2     PROLAPSE: Cystocele per pt, pressure felt vaginally, bulge felt there as well. Straining with voiding if she attempts she will feel it the most and sometimes with lifting.      OBJECTIVE:    DIAGNOSTIC FINDINGS:      COGNITION: Overall cognitive status: Within functional limits for tasks assessed                          SENSATION: Light touch: Appears intact Proprioception: Appears intact   MUSCLE LENGTH: Bil hamstrings and adductors limited by 25%     POSTURE: rounded shoulders, forward head, and posterior pelvic tilt       LUMBARAROM/PROM:   A/PROM A/PROM  eval  Flexion Limited by 25%  Extension WFL  Right lateral flexion Limited by 25%  Left lateral flexion Limited by 25%  Right rotation Limited by 25%  Left rotation Limited by 25%   (Blank rows = not tested)   LOWER EXTREMITY ROM:   WFL   LOWER EXTREMITY MMT:   Bil hips grossly 4/5, knees and ankles 5/5    PALPATION:   General  no TTP but did have fascial restrictions throughout abdomen                  External Perineal Exam dryness noted at vulva, tissue whitening                             Internal Pelvic Floor no pain, WFL   Patient confirms identification and approves PT to assess internal pelvic floor and treatment Yes   PELVIC MMT:   MMT eval 02/10/22  Vaginal 2/5, 3s, 3 reps 3/5 holding for 6 sec          TONE: WFL   PROLAPSE: In hooklying possible grade 2 anterior wall laxity noted with cough   TODAY'S TREATMENT:   02/10/22 Manual: Internal pelvic floor techniques: No emotional/communication  barriers or cognitive limitation. Patient is motivated to learn. Patient understands and agrees with treatment goals and plan. PT  explains patient will be examined in standing, sitting, and lying down to see how their muscles and joints work. When they are ready, they will be asked to remove their underwear so PT can examine their perineum. The patient is also given the option of providing their own chaperone as one is not provided in our facility. The patient also has the right and is explained the right to defer or refuse any part of the evaluation or treatment including the internal exam. With the patient's consent, PT will use one gloved finger to gently assess the muscles of the pelvic floor, seeing how well it contracts and relaxes and if there is muscle symmetry. After, the patient will get dressed and PT and patient will discuss exam findings and plan of care. PT and patient discuss plan of care, schedule, attendance policy and HEP activities.  Manual work to the introitus, along the ischiocavernosus, along the perineal body, along the sides of the bladder, urethra sphincter.   Neuromuscular re-education: Pelvic floor contraction training: Tapping of the pelvic floor to work on circular contraction around the therapist finger Down training: Educated patient on laying down when she feels the prolapse and toward the end of the day to reduce the strain on the prolapse Exercises: Strengthening: Nustep level 5 for 6 minutes while assessing patient Therapeutic activities: Functional strengthening activities: Instruction on keeping the rib cage and pubic bone apart and not reducing the distance to decrease pressure on the prolapse.  Squatting keeping the distance between the rib cage and pubic bone with flexion at hips Sitting with chest up and not slouching.      02/03/22 Neuromuscular re-education: Down training: Educated patient on the urge to void and how to deter. She was able to return demonstration.  Exercises: Strengthening: Nustep level 3 for 6 minutes while assessing patient.  Bridges 10x 2 with ball squeeze  and pelvic floor contraction Supine clam with red band and pelvic floor contraction with legs elevated 10 x 2 Supine with legs elevated squeeze ball holding 5 sec with pelvic floor contraction Sidely hip abduction with pelvic floor contraction 10 x 2 each side Prone hip extension 10 x 2 bil.       01/29/22 Neuromuscular re-education: Pelvic floor contraction training: Supine with legs elevated squeeze ball holding 5 sec with pelvic floor contraction Supine clam with red band and pelvic floor contraction  Bridges 10x Educated patient on how to moisturize the vaginal tissue and massage to improve blood flow and health of tissue to assist with contraction.  Educated patient on management of prolapse and what it is so she understands why she is doing the exercises.  Down training: Supine with legs elevated and diaphragmatic breathing with therapist guiding the lower rib cage  PATIENT EDUCATION:  01/29/22 Education details: Ardeth Perfect, educated on vaginal moisturizers, how to manage prolapse Person educated: Patient Education method: Explanation, Demonstration, Tactile cues, Verbal cues, and Handouts Education comprehension: verbalized understanding and returned demonstration   HOME EXERCISE PROGRAM: 01/29/22 Access Code: [email or web address] URL: [email or web address] Date: 01/29/2022 Prepared by: Earlie Counts   Exercises - Supine Diaphragmatic Breathing  - 1 x daily - 7 x weekly - 3 sets - 10 reps - Supine Pelvic Floor Contraction  - 1 x daily - 7 x weekly - 3 sets - 10 reps - Diaphragmatic Breathing with Hips Elevated (for Pelvic Organ Prolapse)  - 2 x daily - 7 x weekly - 1 sets - 10 reps - Pelvic Floor Muscle Contraction and Adductor Squeeze With Hips Elevated (for Pelvic Organ Prolapse)  - 2 x daily - 7 x weekly - 1 sets - 10 reps - 5 sec hold - Pelvic  Floor Muscle Contraction With Resisted Abduction With Hips Elevated (for Pelvic Organ Prolapse)  - 2 x daily - 7 x weekly - 1 sets - 10 reps - Supine Bridge with Pelvic Floor Contraction  - 1 x daily - 7 x weekly - 1 sets - 10 reps   ASSESSMENT:   CLINICAL IMPRESSION: Patient is a 77 y.o. female  who was seen today for physical therapy  treatment for prolapse of anterior vaginal wall, urinary frequency. Pelvic floor strength increased to 3/5 with weak hug of therapist finger. Patient was able to feel the pelvic floor contraction.  Patient is still feeling the bulge especially at end of day. Patient understands she is keep the distance between the pubic bone and rib cage to reduce the pressure on the prolapse. Patient has a red structure protruding from the urethra. Patient will benefit from skilled therapy to improve pelvic floor strength and understands ways to manage prolapse.     OBJECTIVE IMPAIRMENTS: decreased coordination, decreased endurance, decreased mobility, difficulty walking, decreased strength, increased fascial restrictions, impaired flexibility, improper body mechanics, and postural dysfunction.    ACTIVITY LIMITATIONS: lifting, standing, and continence   PARTICIPATION LIMITATIONS: community activity   PERSONAL FACTORS: Fitness and Time since onset of injury/illness/exacerbation are also affecting patient's functional outcome.    REHAB POTENTIAL: Good   CLINICAL DECISION MAKING: Stable/uncomplicated   EVALUATION COMPLEXITY: Low     GOALS: Goals reviewed with patient? Yes   SHORT TERM GOALS: Target date: 02/11/22   Pt to be I with HEP.  Baseline: Goal status: Met 02/03/22   2.  Pt will report her BMs are complete at least 50% of the time due to improved bowel habits and evacuation techniques.  Baseline:  Goal status: met 02/03/22   3.  Pt to demonstrate at least 3/5 pelvic floor strength for improved pelvic stability and decreased strain at pelvic floor/ decrease  leakage.  Baseline:  Goal status: INITIAL   4.  Pt will have 75% less urgency due to bladder retraining and strengthening  Baseline: improved by 50% Goal status: Ongoing 02/03/22     LONG TERM GOALS: Target date: 04/15/22   Pt to be I with advanced HEP.  Baseline:  Goal status: INITIAL   2.  Pt to demonstrate at least 5/5 bil hip strength for improved pelvic stability and functional squats without leakage.  Baseline:  Goal status: INITIAL   3.  Pt to demonstrate at least 4/5 pelvic floor strength for improved pelvic stability and decreased strain at pelvic floor/ decrease leakage.  Baseline:  Goal status: INITIAL  4.  Pt to be I with prolapse relief positions, breathing mechanics and voiding mechanics to decrease strain at pelvic floor and prolapse without cues.  Baseline:  Goal status: INITIAL   5.   Pt to demonstrate improved coordination of pelvic floor and breathing mechanics with 10# squats to decreased strain at pelvic floor and prolapse Baseline:  Goal status: INITIAL     PLAN:   PT FREQUENCY: 1x/week   PT DURATION:  8 sessions   PLANNED INTERVENTIONS: Therapeutic exercises, Therapeutic activity, Neuromuscular re-education, Patient/Family education, Self Care, Joint mobilization, Dry Needling, Electrical stimulation, Cryotherapy, Moist heat, scar mobilization, Taping, Biofeedback, and Manual therapy   PLAN FOR NEXT SESSION:  breathing mechanics, core/hip/pelvic floor strengthening and coordination, back strength to build up extension   Earlie Counts, PT 02/10/22 3:40 PM

## 2022-02-12 ENCOUNTER — Ambulatory Visit: Payer: Medicare Other | Admitting: Gastroenterology

## 2022-02-12 ENCOUNTER — Encounter: Payer: Self-pay | Admitting: Gastroenterology

## 2022-02-12 VITALS — BP 134/78 | HR 84 | Ht 64.0 in | Wt 88.0 lb

## 2022-02-12 DIAGNOSIS — R634 Abnormal weight loss: Secondary | ICD-10-CM | POA: Diagnosis not present

## 2022-02-12 DIAGNOSIS — Z1211 Encounter for screening for malignant neoplasm of colon: Secondary | ICD-10-CM | POA: Diagnosis not present

## 2022-02-12 DIAGNOSIS — R6881 Early satiety: Secondary | ICD-10-CM | POA: Diagnosis not present

## 2022-02-12 DIAGNOSIS — R140 Abdominal distension (gaseous): Secondary | ICD-10-CM

## 2022-02-12 MED ORDER — PANTOPRAZOLE SODIUM 40 MG PO TBEC: 40.0000 mg | DELAYED_RELEASE_TABLET | Freq: Every day | ORAL | 3 refills | Status: DC

## 2022-02-12 MED ORDER — NA SULFATE-K SULFATE-MG SULF 17.5-3.13-1.6 GM/177ML PO SOLN: 1.0000 | Freq: Once | ORAL | 0 refills | Status: AC

## 2022-02-12 NOTE — Progress Notes (Signed)
Referring Provider: [name], DO Primary Care Physician:  [name], DO  Reason for Consultation: Bloating, feeling really full, vaginal prolapse   IMPRESSION:  Bloating  Early satiety Weight loss >10% of body mass BMI 15 Cystocele and uterine prolapse No prior colon cancer screening    PLAN: - CT abd/pelvis with contrast - EGD with gastric and duodenal biopsies - Colonoscopy - Trial of pantoprazole 40 mg QAM   HPI: Wendy Jackson is a 77 y.o. female referred by Dr. Quincy Simmonds for further evaluation of bloating, early satiety, and vaginal prolapse. Her husband accompanies her to this appointment and contributes to the history. She has hypertension, hyperlipidemia, hypothyroidism, history of a ischemic cerebrovascular accident April 2022, acquired thrombophilia, and lupus treated with plaquenil. She is completing pelvic floor PT with Dr. Pearline Cables for history of first-degree cystocele and uterine prolapse.   She has abdominal bloating and progressive early satiety over the day. She has unintentionally lost at least 10 pounds over the last year despite having a good appetite.  There is been no change in bowel habits.  No constipation.  Rare fecal incontinence.  No melena, hematochezia, or bright red blood per rectum.  No prior endoscopic evaluation or colon cancer screening.   She follows a gluten free diet since 2008 to try to control her lupus. She has no GI symptoms associated with this but will have arthralgias with exposure to gluten.   Normal pelvic ultrasound 12/12/21.   No recent abdominal imaging.  No NSAIDs.   There is no known family history of colon cancer or polyps. No family history of stomach cancer or other GI malignancy. No family history of inflammatory bowel disease or celiac. Her husband has Crohn's for over 30 years and is [facility].   Past Medical History:  Diagnosis Date   Allergy Spring 2010   Cataract 03/2019   Glaucoma Approximately 2005    Horseshoe retinal tear of left eye 04/02/2021   Hyperlipidemia LDL goal <70 01/11/2021   Hypertension 2022   Long-term use of Plaquenil 04/02/2021   Lupus ([place])    new rheum questioning original dx. continuing Plaquenil at lower dose for now.   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 bladder, acquired    Pseudophakia of both eyes 04/02/2021   Retinal detachment with multiple breaks, right eye 04/02/2021   Stroke ([place]) 04/2020   Thyroid disease        Current Outpatient Medications  Medication Sig Dispense Refill   ARMOUR THYROID 30 MG tablet Take 1 tablet (30 mg total) by mouth daily. 90 tablet 4   aspirin EC 81 MG tablet Take 1 tablet (81 mg total) by mouth daily. Swallow whole. 30 tablet 11   cetirizine (ZYRTEC) 10 MG chewable tablet Chew 10 mg by mouth daily.     Cholecalciferol (VITAMIN D-3) 125 MCG (5000 UT) TABS Take by mouth daily.     hydroxychloroquine (PLAQUENIL) 200 MG tablet Take 200 mg by mouth daily.     losartan (COZAAR) 100 MG tablet Take 1 tablet (100 mg total) by mouth daily. 90 tablet 1   LUMIGAN 0.01 % SOLN Place 1 drop into the left eye at bedtime.     Misc Natural Products (AIRBORNE ELDERBERRY) CHEW Chew 1 tablet by mouth in the morning and at bedtime.     Na Sulfate-K Sulfate-Mg Sulf 17.5-3.13-1.6 GM/177ML SOLN Take 1 kit by mouth once for 1 dose. 354 mL  0   pantoprazole (PROTONIX) 40 MG tablet Take 1 tablet (40 mg total) by mouth daily. 90 tablet 3   Probiotic Product (UP4 PROBIOTICS WOMENS PO) Take 1 capsule by mouth daily.     Resveratrol 50 MG CAPS Take 50 mg by mouth daily.     rosuvastatin (CRESTOR) 40 MG tablet Take 1 tablet (40 mg total) by mouth daily. 90 tablet 3   Ubiquinol 100 MG CAPS Take 100 mg by mouth.     verapamil (CALAN-SR) 180 MG CR tablet Take 1 tablet (180 mg total) by mouth at bedtime. 90 tablet 1   No current facility-administered medications for this visit.    Allergies as of 02/12/2022 -  Review Complete 02/12/2022  Allergen Reaction Noted   Amlodipine Other (See Comments) 12/11/2020   Augmentin [amoxicillin-pot clavulanate] Other (See Comments) 08/04/2019   Tramadol  03/06/2020   Flexeril [cyclobenzaprine] Palpitations 08/04/2019    Family History  Problem Relation Age of Onset   ALS Mother    Early death Mother    Dementia Father    Hypertension Father    Macular degeneration Maternal Aunt    Alcohol abuse Paternal Uncle    Alcohol abuse Paternal Uncle     Social History   Socioeconomic History   Marital status: Married    Spouse name: [name]   Number of children: 2   Years of education: Not on file   Highest education level: Some college, no degree  Occupational History   Occupation: retired   Occupation: retired  Tobacco Use   Smoking status: Never    Passive exposure: Never   Smokeless tobacco: Never  Vaping Use   Vaping Use: Never used  Substance and Sexual Activity   Alcohol use: Never   Drug use: Never   Sexual activity: Not Currently    Birth control/protection: Surgical  Other Topics Concern   Not on file  Social History Narrative   Marital status/children/pets: Married.  2 children.   Education/employment: Retired.  College-educated.   Safety:      -smoke alarm in the home:Yes     - wears seatbelt: Yes     - Feels safe in their relationships: Yes      Social Determinants of Health   Financial Resource Strain: Low Risk  (06/12/2021)   Overall Financial Resource Strain (CARDIA)    Difficulty of Paying Living Expenses: Not hard at all  Food Insecurity: No Food Insecurity (06/12/2021)   Hunger Vital Sign    Worried About Running Out of Food in the Last Year: Never true    Ran Out of Food in the Last Year: Never true  Transportation Needs: No Transportation Needs (06/12/2021)   PRAPARE - Hydrologist (Medical): No    Lack of Transportation (Non-Medical): No  Physical Activity: Insufficiently Active  (06/12/2021)   Exercise Vital Sign    Days of Exercise per Week: 4 days    Minutes of Exercise per Session: 30 min  Stress: No Stress Concern Present (06/12/2021)   [place]    Feeling of Stress : Not at all  Social Connections: Moderately Isolated (06/12/2021)   Social Connection and Isolation Panel [NHANES]    Frequency of Communication with Friends and Family: Twice a week    Frequency of Social Gatherings with Friends and Family: Never    Attends Religious Services: 1 to 4 times per year    Active Member of Clubs or  Organizations: No    Attends Archivist Meetings: Never    Marital Status: Married  Human resources officer Violence: Not At Risk (06/12/2021)   Humiliation, Afraid, Rape, and Kick questionnaire    Fear of Current or Ex-Partner: No    Emotionally Abused: No    Physically Abused: No    Sexually Abused: No    Review of Systems: 12 system ROS is negative except as noted above with a recent right hip injury after moving a sofa table, sleeping problems, scattered urinary incontinence.   Physical Exam: General:   Alert,  well-nourished, pleasant and cooperative in NAD Head:  Normocephalic and atraumatic. Eyes:  Sclera clear, no icterus.   Conjunctiva pink. Ears:  Normal auditory acuity. Nose:  No deformity, discharge,  or lesions. Mouth:  No deformity or lesions.   Neck:  Supple; no masses or thyromegaly. Lungs:  Clear throughout to auscultation.   No wheezes. Heart:  Regular rate and rhythm; no murmurs. Abdomen:  Soft, thin, scaphoid, nontender, nondistended, normal bowel sounds, no rebound or guarding. No hepatosplenomegaly.   Rectal:  Deferred  Msk:  Symmetrical. No boney deformities LAD: No inguinal or umbilical LAD Extremities:  No clubbing or edema. Neurologic:  Alert and  oriented x4;  grossly nonfocal Skin:  Intact without significant lesions or rashes. Psych:  Alert and cooperative. Normal  mood and affect.  I spent 45 minutes, including in depth chart review, independent review of results, communicating results with the patient directly, face-to-face time with the patient, coordinating care, ordering studies and medications as appropriate, and documentation.    Burgandy Hackworth L. Tarri Glenn, MD, MPH 02/12/2022, 12:04 PM

## 2022-02-12 NOTE — Patient Instructions (Signed)
We have sent the following medications to your pharmacy for you to pick up at your convenience: Pantoprazole 40 mg daily 30-60 minutes before breakfast.   You have been scheduled for a CT scan of the abdomen and pelvis at [facility], 1st floor Radiology. You are scheduled on Friday 02/21/22 at 9:30 am. You should arrive 15 minutes prior to your appointment time for registration.   Please follow the written instructions below on the day of your exam:   1) Do not eat anything after 7:30 am (4 hours prior to your test)     You may take any medications as prescribed with a small amount of water, if necessary. If you take any of the following medications: METFORMIN, GLUCOPHAGE, GLUCOVANCE, AVANDAMET, RIOMET, FORTAMET, [place] MET, JANUMET, GLUMETZA or METAGLIP, you MAY be asked to HOLD this medication 48 hours AFTER the exam.   The purpose of you drinking the oral contrast is to aid in the visualization of your intestinal tract. The contrast solution may cause some diarrhea. Depending on your individual set of symptoms, you may also receive an intravenous injection of x-ray contrast/dye. Plan on being at [facility] for 45 minutes or longer, depending on the type of exam you are having performed.   If you have any questions regarding your exam or if you need to reschedule, you may call [facility] at [phone] between the hours of 8:00 am and 5:00 pm, Monday-Friday.

## 2022-02-19 ENCOUNTER — Ambulatory Visit: Payer: Medicare Other | Admitting: Physical Therapy

## 2022-02-21 ENCOUNTER — Ambulatory Visit (HOSPITAL_COMMUNITY)
Admission: RE | Admit: 2022-02-21 | Discharge: 2022-02-21 | Disposition: A | Discharge status: Home / Self Care | Payer: Medicare Other | Source: Ambulatory Visit | Attending: Gastroenterology | Admitting: Gastroenterology

## 2022-02-21 ENCOUNTER — Encounter (HOSPITAL_COMMUNITY): Payer: Self-pay

## 2022-02-21 DIAGNOSIS — R6881 Early satiety: Secondary | ICD-10-CM

## 2022-02-21 DIAGNOSIS — R634 Abnormal weight loss: Secondary | ICD-10-CM | POA: Insufficient documentation

## 2022-02-21 DIAGNOSIS — R140 Abdominal distension (gaseous): Secondary | ICD-10-CM | POA: Diagnosis not present

## 2022-02-21 MED ORDER — IOHEXOL 9 MG/ML PO SOLN
ORAL | Status: AC
  Administered 2022-02-21: 1000 mL
  Filled 2022-02-21: qty 1000

## 2022-02-21 MED ORDER — IOHEXOL 300 MG/ML  SOLN
80.0000 mL | Freq: Once | INTRAMUSCULAR | Status: AC | PRN
  Administered 2022-02-21: 80 mL via INTRAVENOUS

## 2022-02-28 DIAGNOSIS — H401133 Primary open-angle glaucoma, bilateral, severe stage: Secondary | ICD-10-CM | POA: Diagnosis not present

## 2022-02-28 DIAGNOSIS — H02403 Unspecified ptosis of bilateral eyelids: Secondary | ICD-10-CM | POA: Diagnosis not present

## 2022-02-28 DIAGNOSIS — Z79899 Other long term (current) drug therapy: Secondary | ICD-10-CM | POA: Diagnosis not present

## 2022-02-28 DIAGNOSIS — H43812 Vitreous degeneration, left eye: Secondary | ICD-10-CM | POA: Diagnosis not present

## 2022-02-28 DIAGNOSIS — H35371 Puckering of macula, right eye: Secondary | ICD-10-CM | POA: Diagnosis not present

## 2022-02-28 DIAGNOSIS — H409 Unspecified glaucoma: Secondary | ICD-10-CM | POA: Diagnosis not present

## 2022-02-28 DIAGNOSIS — Z888 Allergy status to other drugs, medicaments and biological substances status: Secondary | ICD-10-CM | POA: Diagnosis not present

## 2022-02-28 DIAGNOSIS — H33312 Horseshoe tear of retina without detachment, left eye: Secondary | ICD-10-CM | POA: Diagnosis not present

## 2022-02-28 DIAGNOSIS — H33021 Retinal detachment with multiple breaks, right eye: Secondary | ICD-10-CM | POA: Diagnosis not present

## 2022-02-28 DIAGNOSIS — Z885 Allergy status to narcotic agent status: Secondary | ICD-10-CM | POA: Diagnosis not present

## 2022-02-28 DIAGNOSIS — H16142 Punctate keratitis, left eye: Secondary | ICD-10-CM | POA: Diagnosis not present

## 2022-02-28 DIAGNOSIS — H26491 Other secondary cataract, right eye: Secondary | ICD-10-CM | POA: Diagnosis not present

## 2022-02-28 DIAGNOSIS — Z880 Allergy status to penicillin: Secondary | ICD-10-CM | POA: Diagnosis not present

## 2022-02-28 DIAGNOSIS — H3122 Choroidal dystrophy (central areolar) (generalized) (peripapillary): Secondary | ICD-10-CM | POA: Diagnosis not present

## 2022-02-28 DIAGNOSIS — Z961 Presence of intraocular lens: Secondary | ICD-10-CM | POA: Diagnosis not present

## 2022-03-10 ENCOUNTER — Encounter: Payer: Self-pay | Admitting: Physical Therapy

## 2022-03-10 ENCOUNTER — Ambulatory Visit: Payer: Medicare Other | Attending: Obstetrics and Gynecology | Admitting: Physical Therapy

## 2022-03-10 DIAGNOSIS — R279 Unspecified lack of coordination: Secondary | ICD-10-CM

## 2022-03-10 DIAGNOSIS — R293 Abnormal posture: Secondary | ICD-10-CM

## 2022-03-10 DIAGNOSIS — M6281 Muscle weakness (generalized): Secondary | ICD-10-CM | POA: Diagnosis not present

## 2022-03-10 NOTE — Therapy (Signed)
OUTPATIENT PHYSICAL THERAPY TREATMENT NOTE   Patient Name: Wendy Jackson MRN: [id number] DOB:[birth year], 77 y.o., female Today's Date: 03/10/2022  PCP: [name], DO  REFERRING PROVIDER:  Nunzio Cobbs, MD   END OF SESSION:   PT End of Session - 03/10/22 1146     Visit Number 5    Date for PT Re-Evaluation 04/15/22    Authorization Type BCBS MCR    Authorization - Visit Number 4    Authorization - Number of Visits 10    PT Start Time [id number]    PT Stop Time 1225    PT Time Calculation (min) 40 min    Activity Tolerance Patient tolerated treatment well    Behavior During Therapy [facility] for tasks assessed/performed             Past Medical History:  Diagnosis Date   Allergy Spring 2010   Cataract 03/2019   Glaucoma Approximately 2005   Horseshoe retinal tear of left eye 04/02/2021   Hyperlipidemia LDL goal <70 01/11/2021   Hypertension 2022   Long-term use of Plaquenil 04/02/2021   Lupus ([place])    new rheum questioning original dx. continuing Plaquenil at lower dose for now.   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   Other female genital prolapse 10/29/2021   Immunization not carried out because of parent refusal 05/06/2021   Long-term use of Plaquenil 04/02/2021   Primary open angle glaucoma (POAG) of both eyes, severe stage 04/02/2021   White coat syndrome with diagnosis of  hypertension 01/11/2021   Hyperlipidemia LDL goal <70 01/11/2021   Acquired thrombophilia ([place]) 12/11/2020   BMI less than 19,adult 12/11/2020   Hypothyroidism 12/11/2020   History of CVA (cerebrovascular accident) 05/02/2020   Lupus ([place])    Allergic rhinitis    REFERRING DIAG: N81.2 (ICD-10-CM) - Incomplete uterovaginal prolapse R35.0 (ICD-10-CM) - Urinary frequency   THERAPY DIAG:  Muscle weakness (generalized)   Abnormal posture   Unspecified lack of coordination   Rationale for Evaluation and Treatment: Rehabilitation   ONSET DATE: July 2023   SUBJECTIVE:  SUBJECTIVE STATEMENT: I have been doing my exercises. I still have the prolapse. The prolapse is worse by the evening  Urinary leakage happens sometimes. I wear liners all the time. I had fecal leakage a couple of days ago in the evening.  Does have a little urinary frequency in the morning but drinks coffee, tea, water fairly close together.       PAIN:  Are you having pain? No     PRECAUTIONS: None   WEIGHT BEARING RESTRICTIONS: No   FALLS:  Has patient fallen in last 6 months? No   LIVING ENVIRONMENT: Lives with: lives with their family Lives in: House/apartment     OCCUPATION: retired    PLOF: Independent   PATIENT GOALS: to have decreased prolapse symptoms   PERTINENT HISTORY:  Stroke, thyroid disease, ENDARTERECTOMY, lupus Sexual abuse: No   BOWEL MOVEMENT: Pain with bowel movement: No Type of bowel movement:Type (Bristol Stool Scale) 6-5, Frequency usually 1-2x per day has slowed a little in recently to once per day, and Strain No (not usually sometimes) Fully empty rectum: No not always Leakage: Yes: unaware most of the time but notices it sometimes after bowel movement  Pads: No Fiber supplement: No    URINATION: Pain with urination: No Fully empty bladder: Yes:   Stream: Strong Urgency: Yes: mostly at night Frequency: in am may go quicker than every 2 hours, night 1-2x  Leakage:  only very occasionally with nighttime urgency  Pads: No   INTERCOURSE: Pain with intercourse:  not active   PREGNANCY: Vaginal deliveries 2     PROLAPSE: Cystocele per pt, pressure felt vaginally, bulge felt there as well. Straining with voiding if she attempts she will feel it the most and sometimes with lifting.      OBJECTIVE:    DIAGNOSTIC FINDINGS:      COGNITION: Overall cognitive status: Within functional limits for tasks assessed                          SENSATION: Light touch: Appears intact Proprioception: Appears intact   MUSCLE LENGTH: Bil hamstrings and adductors limited by 25%     POSTURE: rounded shoulders, forward head, and posterior pelvic tilt       LUMBARAROM/PROM:   A/PROM A/PROM  eval  Flexion Limited by 25%  Extension WFL  Right lateral flexion Limited by 25%  Left lateral flexion Limited by 25%  Right rotation Limited by 25%  Left rotation Limited by 25%   (Blank rows = not tested)   LOWER EXTREMITY ROM:   WFL   LOWER EXTREMITY MMT:   Bil hips grossly 4/5, knees and ankles 5/5    PALPATION:   General  no TTP but did have fascial restrictions throughout abdomen                  External Perineal Exam dryness noted at vulva, tissue whitening                             Internal Pelvic Floor no pain, WFL   Patient confirms identification and approves PT to assess internal pelvic floor and treatment Yes   PELVIC MMT:   MMT eval 02/10/22  Vaginal 2/5, 3s, 3 reps 3/5 holding for 6 sec          TONE: WFL   PROLAPSE: In hooklying possible grade 2 anterior wall laxity noted with cough  TODAY'S TREATMENT:   03/10/22 Exercises: Strengthening: Nustep level 5 for 6 minutes while assessing patient Contract and lean forward Sit to stand  and  back with pelvic floor contraction LAQ with 4# on each leg with ball squeeze and contract pelvic floor.  Marching with 4# on each leg with ball between knees and pelvic floor contraction    02/10/22 Manual: Internal pelvic floor techniques: No emotional/communication barriers or cognitive limitation. Patient is motivated to learn. Patient understands and agrees with treatment goals and plan. PT explains patient will be examined in standing, sitting, and lying down to see how their muscles and joints work. When they are ready, they will be asked to remove their underwear so PT can examine their perineum. The patient is also given the option of providing their own chaperone as one is not provided in our facility. The patient also has the right and is explained the right to defer or refuse any part of the evaluation or treatment including the internal exam. With the patient's consent, PT will use one gloved finger to gently assess the muscles of the pelvic floor, seeing how well it contracts and relaxes and if there is muscle symmetry. After, the patient will get dressed and PT and patient will discuss exam findings and plan of care. PT and patient discuss plan of care, schedule, attendance policy and HEP activities.  Manual work to the introitus, along the ischiocavernosus, along the perineal body, along the sides of the bladder, urethra sphincter.    Neuromuscular re-education: Pelvic floor contraction training: Tapping of the pelvic floor to work on circular contraction around the therapist finger Down training: Educated patient on laying down when she feels the prolapse and toward the end of the day to reduce the strain on the prolapse Exercises: Strengthening: Nustep level 5 for 6 minutes while assessing patient Therapeutic activities: Functional strengthening activities: Instruction on keeping the rib cage and pubic bone apart and not reducing the distance to decrease pressure on the prolapse.   Squatting keeping the distance between the rib cage and pubic bone with flexion at hips Sitting with chest up and not slouching.      02/03/22 Neuromuscular re-education: Down training: Educated patient on the urge to void and how to deter. She was able to return demonstration.  Exercises: Strengthening: Nustep level 3 for 6 minutes while assessing patient.  Bridges 10x 2 with ball squeeze and pelvic floor contraction Supine clam with red band and pelvic floor contraction with legs elevated 10 x 2 Supine with legs elevated squeeze ball holding 5 sec with pelvic floor contraction Sidely hip abduction with pelvic floor contraction 10 x 2 each side Prone hip extension 10 x 2 bil.                                                                                                                                     PATIENT EDUCATION:  03/10/22 Education  details: [id number], educated on vaginal moisturizers, how to manage prolapse Person educated: Patient Education method: Explanation, Demonstration, Tactile cues, Verbal cues, and Handouts Education comprehension: verbalized understanding and returned demonstration   HOME EXERCISE PROGRAM: 03/10/22 Access Code: [email or web address] URL: [email or web address] Date: 03/10/2022 Prepared by: Earlie Counts  Program Notes walk more in the earlier day than later in the day.   Exercises -- Seated Exhale with Pelvic Floor Contraction and Hand to Mouth  - 1 x daily - 7 x weekly - 1 sets - 10 reps - Sit to Stand with Pelvic Floor Contraction  - 1 x daily - 7 x weekly - 1 sets - 10 reps   ASSESSMENT:   CLINICAL IMPRESSION: Patient is a 77 y.o. female  who was seen today for physical therapy  treatment for prolapse of anterior vaginal wall, urinary frequency. Pelvic floor strength increased to 3/5 with weak hug of therapist finger. Patient will leak urine more with going into standing. She feel the prolapse more toward the end of the day. Patient  has the urge to urinate in the middle of the night and trouble making it to the bathroom.  Patient will benefit from skilled therapy to improve pelvic floor strength and understands ways to manage prolapse.      OBJECTIVE IMPAIRMENTS: decreased coordination, decreased endurance, decreased mobility, difficulty walking, decreased strength, increased fascial restrictions, impaired flexibility, improper body mechanics, and postural dysfunction.    ACTIVITY LIMITATIONS: lifting, standing, and continence   PARTICIPATION LIMITATIONS: community activity   PERSONAL FACTORS: Fitness and Time since onset of injury/illness/exacerbation are also affecting patient's functional outcome.    REHAB POTENTIAL: Good   CLINICAL DECISION MAKING: Stable/uncomplicated   EVALUATION COMPLEXITY: Low     GOALS: Goals reviewed with patient? Yes   SHORT TERM GOALS: Target date: 02/11/22   Pt to be I with HEP.  Baseline: Goal status: Met 02/03/22   2.  Pt will report her BMs are complete at least 50% of the time due to improved bowel habits and evacuation techniques.  Baseline:  Goal status: met 02/03/22   3.  Pt to demonstrate at least 3/5 pelvic floor strength for improved pelvic stability and decreased strain at pelvic floor/ decrease leakage.  Baseline:  Goal status: INITIAL   4.  Pt will have 75% less urgency due to bladder retraining and strengthening  Baseline: improved by 50% Goal status: Ongoing 02/03/22     LONG TERM GOALS: Target date: 04/15/22   Pt to be I with advanced HEP.  Baseline:  Goal status: INITIAL   2.  Pt to demonstrate at least 5/5 bil hip strength for improved pelvic stability and functional squats without leakage.  Baseline:  Goal status: INITIAL   3.  Pt to demonstrate at least 4/5 pelvic floor strength for improved pelvic stability and decreased strain at pelvic floor/ decrease leakage.  Baseline:  Goal status: INITIAL   4.  Pt to be I with prolapse relief positions,  breathing mechanics and voiding mechanics to decrease strain at pelvic floor and prolapse without cues.  Baseline:  Goal status: Met 03/10/22   5.   Pt to demonstrate improved coordination of pelvic floor and breathing mechanics with 10# squats to decreased strain at pelvic floor and prolapse Baseline:  Goal status: INITIAL     PLAN:   PT FREQUENCY: 1x/week   PT DURATION:  8 sessions   PLANNED INTERVENTIONS: Therapeutic exercises, Therapeutic activity, Neuromuscular re-education, Patient/Family education, Self Care, Joint mobilization, Dry Needling, Electrical stimulation, Cryotherapy,  Moist heat, scar mobilization, Taping, Biofeedback, and Manual therapy   PLAN FOR NEXT SESSION:  breathing mechanics, core/hip/pelvic floor strengthening and coordination, back strength to build up extension   Earlie Counts, PT 03/10/22 1:24 PM

## 2022-03-17 ENCOUNTER — Encounter: Payer: Self-pay | Admitting: Gastroenterology

## 2022-03-17 ENCOUNTER — Ambulatory Visit (AMBULATORY_SURGERY_CENTER): Payer: Medicare Other | Admitting: Gastroenterology

## 2022-03-17 ENCOUNTER — Encounter: Payer: Medicare Other | Admitting: Physical Therapy

## 2022-03-17 VITALS — BP 139/66 | HR 55 | Temp 97.5°F | Resp 13 | Ht 64.0 in | Wt 87.2 lb

## 2022-03-17 DIAGNOSIS — D123 Benign neoplasm of transverse colon: Secondary | ICD-10-CM | POA: Diagnosis not present

## 2022-03-17 DIAGNOSIS — Z1211 Encounter for screening for malignant neoplasm of colon: Secondary | ICD-10-CM | POA: Diagnosis not present

## 2022-03-17 DIAGNOSIS — R634 Abnormal weight loss: Secondary | ICD-10-CM

## 2022-03-17 DIAGNOSIS — R6881 Early satiety: Secondary | ICD-10-CM | POA: Diagnosis not present

## 2022-03-17 DIAGNOSIS — R140 Abdominal distension (gaseous): Secondary | ICD-10-CM

## 2022-03-17 DIAGNOSIS — K2950 Unspecified chronic gastritis without bleeding: Secondary | ICD-10-CM | POA: Diagnosis not present

## 2022-03-17 DIAGNOSIS — K2100 Gastro-esophageal reflux disease with esophagitis, without bleeding: Secondary | ICD-10-CM | POA: Diagnosis not present

## 2022-03-17 LAB — HM COLONOSCOPY

## 2022-03-17 MED ORDER — SODIUM CHLORIDE 0.9 % IV SOLN: 500.0000 mL | Freq: Once | INTRAVENOUS | Status: DC

## 2022-03-17 NOTE — Progress Notes (Signed)
Patient states abd pain of #8.  Dr notified and pt was given simethicone as directed as well as the previous levin dose.   Patient stated that the pain came down to 6.  Now, at 4:28 pm pt states that the pain is about a 4.  Patient has turned form side to side.  435 PM pt states that the pain is a 3.

## 2022-03-17 NOTE — Progress Notes (Signed)
Referring Provider: [name], DO Primary Care Physician:  Howard Pouch A, DO   Indication for EGD:  bloating, early satiety, weight loss Indication for Colonoscopy:  Colon cancer screening   IMPRESSION:  Bloating Early satiety Weight loss Need for colon cancer screening Appropriate candidate for monitored anesthesia care  PLAN: EGD and Colonoscopy in the [place] today   HPI: Wendy Jackson is a 77 y.o. female presents for screening colonoscopy and endoscopic evaluation of bloating, early satiety, and weight loss.  She has abdominal bloating and progressive early satiety over the day. She has unintentionally lost at least 10 pounds over the last year despite having a good appetite.  There is been no change in bowel habits.  No constipation.  Rare fecal incontinence.  No melena, hematochezia, or bright red blood per rectum.   No prior endoscopic evaluation or colon cancer screening.    She follows a gluten free diet since 2008 to try to control her lupus. She has no GI symptoms associated with this but will have arthralgias with exposure to gluten.    Normal pelvic ultrasound 12/12/21.    No recent abdominal imaging.   No NSAIDs.    There is no known family history of colon cancer or polyps. No family history of stomach cancer or other GI malignancy. No family history of inflammatory bowel disease or celiac. Her husband has Crohn's for over 30 years and is followed at [facility].   Past Medical History:  Diagnosis Date   Allergy Spring 2010   Cataract 03/2019   Glaucoma Approximately 2005   [place]hoe retinal tear of left eye 04/02/2021   Hyperlipidemia LDL goal <70 01/11/2021   Hypertension 2022   Long-term use of Plaquenil 04/02/2021   Lupus ([place])    new rheum questioning original dx. continuing Plaquenil at lower dose for now.   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 bladder, acquired    Pseudophakia of both eyes 04/02/2021    Retinal detachment with multiple breaks, right eye 04/02/2021   Stroke ([place]) 04/2020   Thyroid disease     Past Surgical History:  Procedure Laterality Date   ENDARTERECTOMY Right 05/02/2020   Procedure: RIGHT CAROTID ENDARTERECTOMY;  Surgeon: Rosetta Posner, MD;  Location: [facility];  Service: Vascular;  Laterality: Right;   EYE SURGERY  01/2020   SCLERAL BUCKLE Right 2004   TUBAL LIGATION  Feb 1982    Current Outpatient Medications  Medication Sig Dispense Refill   ARMOUR THYROID 30 MG tablet Take 1 tablet (30 mg total) by mouth daily. 90 tablet 4   aspirin EC 81 MG tablet Take 1 tablet (81 mg total) by mouth daily. Swallow whole. 30 tablet 11   cetirizine (ZYRTEC) 10 MG chewable tablet Chew 10 mg by mouth daily.     Cholecalciferol (VITAMIN D-3) 125 MCG (5000 UT) TABS Take by mouth daily.     hydroxychloroquine (PLAQUENIL) 200 MG tablet Take 200 mg by mouth daily.     losartan (COZAAR) 100 MG tablet Take 1 tablet (100 mg total) by mouth daily. 90 tablet 1   LUMIGAN 0.01 % SOLN Place 1 drop into the left eye at bedtime.     Misc Natural Products (AIRBORNE ELDERBERRY) CHEW Chew 1 tablet by mouth in the morning and at bedtime.     pantoprazole (PROTONIX) 40 MG tablet Take 1 tablet (40 mg total) by mouth daily. 90 tablet 3   Probiotic Product (UP4  PROBIOTICS WOMENS PO) Take 1 capsule by mouth daily.     Resveratrol 50 MG CAPS Take 50 mg by mouth daily.     rosuvastatin (CRESTOR) 40 MG tablet Take 1 tablet (40 mg total) by mouth daily. 90 tablet 3   Ubiquinol 100 MG CAPS Take 100 mg by mouth.     verapamil (CALAN-SR) 180 MG CR tablet Take 1 tablet (180 mg total) by mouth at bedtime. 90 tablet 1   Current Facility-Administered Medications  Medication Dose Route Frequency Provider Last Rate Last Admin   0.9 %  sodium chloride infusion  500 mL Intravenous Once Thornton Park, MD        Allergies as of 03/17/2022 - Review Complete 03/17/2022  Allergen Reaction Noted   Amlodipine Other  (See Comments) 12/11/2020   Augmentin [amoxicillin-pot clavulanate] Other (See Comments) 08/04/2019   Tramadol  03/06/2020   Flexeril [cyclobenzaprine] Palpitations 08/04/2019    Family History  Problem Relation Age of Onset   ALS Mother    Early death Mother    Dementia Father    Hypertension Father    Macular degeneration Maternal Aunt    Alcohol abuse Paternal Uncle    Alcohol abuse Paternal Uncle      Physical Exam: General:   Alert,  well-nourished, pleasant and cooperative in NAD Head:  Normocephalic and atraumatic. Eyes:  Sclera clear, no icterus.   Conjunctiva pink. Mouth:  No deformity or lesions.   Neck:  Supple; no masses or thyromegaly. Lungs:  Clear throughout to auscultation.   No wheezes. Heart:  Regular rate and rhythm; no murmurs. Abdomen:  Soft, non-tender, nondistended, normal bowel sounds, no rebound or guarding.  Msk:  Symmetrical. No boney deformities LAD: No inguinal or umbilical LAD Extremities:  No clubbing or edema. Neurologic:  Alert and  oriented x4;  grossly nonfocal Skin:  No obvious rash or bruise. Psych:  Alert and cooperative. Normal mood and affect.     Studies/Results: No results found.    Wendy Jackson L. Tarri Glenn, MD, MPH 03/17/2022, 3:12 PM

## 2022-03-17 NOTE — Op Note (Signed)
[place] Patient Name: Wendy Jackson Procedure Date: 03/17/2022 3:20 PM MRN: [id number] Endoscopist: Thornton Park MD, MD, [id number] Age: 77 Referring MD:  Date of Birth: 1945-08-19 Gender: Female Account #: [id number] Procedure:                Colonoscopy Indications:              Screening for colorectal malignant neoplasm, This                            is the patient's first colonoscopy Medicines:                Monitored Anesthesia Care Procedure:                Pre-Anesthesia Assessment:                           - Prior to the procedure, a History and Physical                            was performed, and patient medications and                            allergies were reviewed. The patient's tolerance of                            previous anesthesia was also reviewed. The risks                            and benefits of the procedure and the sedation                            options and risks were discussed with the patient.                            All questions were answered, and informed consent                            was obtained. Prior Anticoagulants: The patient has                            taken no anticoagulant or antiplatelet agents. ASA                            Grade Assessment: II - A patient with mild systemic                            disease. After reviewing the risks and benefits,                            the patient was deemed in satisfactory condition to                            undergo the procedure.  After obtaining informed consent, the colonoscope                            was passed under direct vision. Throughout the                            procedure, the patient's blood pressure, pulse, and                            oxygen saturations were monitored continuously. The                            [id number]-HQ190L Colonoscope [id number] was introduced                            through the anus and  advanced to the the cecum,                            identified by appendiceal orifice and ileocecal                            valve. A second forward view of the right colon was                            performed. The colonoscopy was technically                            difficult and complex due to a redundant colon,                            significant looping and a tortuous colon.                            Successful completion of the procedure was aided by                            changing the patient's position, withdrawing and                            reinserting the scope and straightening and                            shortening the scope to obtain bowel loop                            reduction. The patient tolerated the procedure                            well. The quality of the bowel preparation was                            excellent. The appendiceal orifice and the rectum  were photographed. Scope In: 3:32:14 PM Scope Out: 3:57:48 PM Scope Withdrawal Time: 0 hours 14 minutes 39 seconds  Total Procedure Duration: 0 hours 25 minutes 34 seconds  Findings:                 The perianal and digital rectal examinations were                            normal.                           A patchy area of mildly altered vascular,                            erythematous and friable (with contact bleeding)                            mucosa was found in the rectum. Biopsies were taken                            with a cold forceps for histology. Estimated blood                            loss was minimal.                           A less than 1 mm polyp was found in the hepatic                            flexure. The polyp was sessile. The polyp was                            removed with a cold biopsy forceps. Resection and                            retrieval were complete. Estimated blood loss was                            minimal.                            The exam was otherwise without abnormality on                            direct and retroflexion views. Complications:            No immediate complications. Estimated Blood Loss:     Estimated blood loss was minimal. Impression:               - Altered vascular, erythematous and friable (with                            contact bleeding) mucosa in the rectum. Biopsied.                           - One less than 1 mm polyp at  the hepatic flexure,                            removed with a cold biopsy forceps. Resected and                            retrieved.                           - The examination was otherwise normal on direct                            and retroflexion views. Recommendation:           - Patient has a contact number available for                            emergencies. The signs and symptoms of potential                            delayed complications were discussed with the                            patient. Return to normal activities tomorrow.                            Written discharge instructions were provided to the                            patient.                           - Resume previous diet.                           - Continue present medications.                           - Await pathology results.                           - Repeat colonoscopy is not recommended due to                            current age (22 years or older) for surveillance.                           - Emerging evidence supports eating a diet of                            fruits, vegetables, grains, calcium, and yogurt                            while reducing red meat and alcohol may reduce the  risk of colon cancer.                           - Thank you for allowing me to be involved in your                            colon cancer prevention. Thornton Park MD, MD 03/17/2022 4:11:25 PM This report has been signed electronically.

## 2022-03-17 NOTE — Progress Notes (Unsigned)
Called to room to assist during endoscopic procedure.  Patient ID and intended procedure confirmed with present staff. Received instructions for my participation in the procedure from the performing physician.  

## 2022-03-17 NOTE — Patient Instructions (Signed)
You will probably have some rectal bleeding due to the biopsies.  If you have a lot of bleeding, do call us.  Resume all of your previous medications.  YOU HAD AN ENDOSCOPIC PROCEDURE TODAY AT [facility]:   Refer to the procedure report that was given to you for any specific questions about what was found during the examination.  If the procedure report does not answer your questions, please call your gastroenterologist to clarify.  If you requested that your care partner not be given the details of your procedure findings, then the procedure report has been included in a sealed envelope for you to review at your convenience later.  YOU SHOULD EXPECT: Some feelings of bloating in the abdomen. Passage of more gas than usual.  Walking can help get rid of the air that was put into your GI tract during the procedure and reduce the bloating. If you had a lower endoscopy (such as a colonoscopy or flexible sigmoidoscopy) you may notice spotting of blood in your stool or on the toilet paper. If you underwent a bowel prep for your procedure, you may not have a normal bowel movement for a few days.  Please Note:  You might notice some irritation and congestion in your nose or some drainage.  This is from the oxygen used during your procedure.  There is no need for concern and it should clear up in a day or so.  SYMPTOMS TO REPORT IMMEDIATELY:  Following lower endoscopy (colonoscopy or flexible sigmoidoscopy):  Excessive amounts of blood in the stool  Significant tenderness or worsening of abdominal pains  Swelling of the abdomen that is new, acute  Fever of 100F or higher  Following upper endoscopy (EGD)  Vomiting of blood or coffee ground material  New chest pain or pain under the shoulder blades  Painful or persistently difficult swallowing  New shortness of breath  Fever of 100F or higher  Black, tarry-looking stools  For urgent or emergent issues, a gastroenterologist can be  reached at any hour by calling [phone]. Do not use MyChart messaging for urgent concerns.    DIET:  We do recommend a small meal at first, but then you may proceed to your regular diet.  Drink plenty of fluids but you should avoid alcoholic beverages for 24 hours.  Try to increase the fiber in your diet, and drink plenty of water.  Try to increase the fiber in your diet, and drink plenty of water.  ACTIVITY:  You should plan to take it easy for the rest of today and you should NOT DRIVE or use heavy machinery until tomorrow (because of the sedation medicines used during the test).    FOLLOW UP: Our staff will call the number listed on your records the next business day following your procedure.  We will call around 7:15- 8:00 am to check on you and address any questions or concerns that you may have regarding the information given to you following your procedure. If we do not reach you, we will leave a message.     If any biopsies were taken you will be contacted by phone or by letter within the next 1-3 weeks.  Please call us at [phone] if you have not heard about the biopsies in 3 weeks.    SIGNATURES/CONFIDENTIALITY: You and/or your care partner have signed paperwork which will be entered into your electronic medical record.  These signatures attest to the fact that that the information above  on your After Visit Summary has been reviewed and is understood.  Full responsibility of the confidentiality of this discharge information lies with you and/or your care-partner.

## 2022-03-17 NOTE — Progress Notes (Signed)
Pt resting comfortably. VSS. Airway intact. SBAR complete to RN. All questions answered.   

## 2022-03-17 NOTE — Op Note (Signed)
[place] Patient Name: Wendy Jackson Procedure Date: 03/17/2022 3:19 PM MRN: [id number] Endoscopist: Thornton Park MD, MD, [id number] Age: 77 Referring MD:  Date of Birth: April 06, 1945 Gender: Female Account #: [id number] Procedure:                Upper GI endoscopy Indications:              Abdominal bloating, Early satiety, Weight loss Medicines:                Monitored Anesthesia Care Procedure:                Pre-Anesthesia Assessment:                           - Prior to the procedure, a History and Physical                            was performed, and patient medications and                            allergies were reviewed. The patient's tolerance of                            previous anesthesia was also reviewed. The risks                            and benefits of the procedure and the sedation                            options and risks were discussed with the patient.                            All questions were answered, and informed consent                            was obtained. Prior Anticoagulants: The patient has                            taken no anticoagulant or antiplatelet agents. ASA                            Grade Assessment: II - A patient with mild systemic                            disease. After reviewing the risks and benefits,                            the patient was deemed in satisfactory condition to                            undergo the procedure.                           After obtaining informed consent, the endoscope was  passed under direct vision. Throughout the                            procedure, the patient's blood pressure, pulse, and                            oxygen saturations were monitored continuously. The                            GIF HQ190 [id number] was introduced through the                            mouth, and advanced to the second part of duodenum.                            The  upper GI endoscopy was accomplished without                            difficulty. The patient tolerated the procedure                            well. Scope In: Scope Out: Findings:                 One small island of salmon-colored mucosa was                            present at 40 cm. No other visible abnormalities                            were present. Biopsies were taken with a cold                            forceps for histology. Estimated blood loss was                            minimal.                           The entire examined stomach was normal. Biopsies                            were taken from the antrum, body, and fundus with a                            cold forceps for histology. Estimated blood loss                            was minimal.                           The examined duodenum was normal. Biopsies were                            taken with a cold forceps for histology. Estimated  blood loss was minimal. Complications:            No immediate complications. Estimated Blood Loss:     Estimated blood loss was minimal. Impression:               - Salmon-colored mucosa suspicious for Barrett's                            esophagus. Biopsied.                           - Normal stomach. Biopsied.                           - Normal examined duodenum. Biopsied. Recommendation:           - Patient has a contact number available for                            emergencies. The signs and symptoms of potential                            delayed complications were discussed with the                            patient. Return to normal activities tomorrow.                            Written discharge instructions were provided to the                            patient.                           - Resume previous diet.                           - Continue present medications.                           - Await pathology results.                            - Proceed with colonoscopy today as previously                            planned. Thornton Park MD, MD 03/17/2022 4:07:18 PM This report has been signed electronically.

## 2022-03-18 ENCOUNTER — Telehealth: Payer: Self-pay | Admitting: *Deleted

## 2022-03-18 NOTE — Telephone Encounter (Signed)
  Follow up Call-     03/17/2022    2:15 PM  Call back number  Post procedure Call Back phone  # [phone]  Permission to leave phone message Yes     Patient questions:  Do you have a fever, pain , or abdominal swelling? No. Pain Score  0 *  Have you tolerated food without any problems? Yes.    Have you been able to return to your normal activities? Yes.    Do you have any questions about your discharge instructions: Diet   No. Medications  No. Follow up visit  No.  Do you have questions or concerns about your Care? No.  Actions: * If pain score is 4 or above: No action needed, pain <4.

## 2022-03-21 ENCOUNTER — Ambulatory Visit: Payer: Medicare Other | Attending: Obstetrics and Gynecology | Admitting: Physical Therapy

## 2022-03-21 ENCOUNTER — Encounter: Payer: Self-pay | Admitting: Physical Therapy

## 2022-03-21 DIAGNOSIS — M6281 Muscle weakness (generalized): Secondary | ICD-10-CM | POA: Diagnosis not present

## 2022-03-21 DIAGNOSIS — R279 Unspecified lack of coordination: Secondary | ICD-10-CM | POA: Insufficient documentation

## 2022-03-21 DIAGNOSIS — R293 Abnormal posture: Secondary | ICD-10-CM | POA: Diagnosis not present

## 2022-03-21 NOTE — Therapy (Signed)
OUTPATIENT PHYSICAL THERAPY TREATMENT NOTE   Patient Name: Wendy Jackson MRN: [id number] DOB:1945/11/04, 77 y.o., female Today's Date: 03/21/2022  PCP: [name], DO   REFERRING PROVIDER: Nunzio Cobbs, MD    END OF SESSION:   PT End of Session - 03/21/22 1059     Visit Number 6    Date for PT Re-Evaluation 04/15/22    Authorization Type [employer] MCR    Authorization - Visit Number 5    Authorization - Number of Visits 10    PT Start Time 1100    PT Stop Time 1140    PT Time Calculation (min) 40 min    Activity Tolerance Patient tolerated treatment well    Behavior During Therapy [facility] for tasks assessed/performed             Past Medical History:  Diagnosis Date   Allergy Spring 2010   Cataract 03/2019   Glaucoma Approximately 2005   [place] retinal tear of left eye 04/02/2021   Hyperlipidemia LDL goal <70 01/11/2021   Hypertension 2022   Long-term use of Plaquenil 04/02/2021   Lupus ([place])    new rheum questioning original dx. continuing Plaquenil at lower dose for now.   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 bladder, acquired    Pseudophakia of both eyes 04/02/2021   Retinal detachment with multiple breaks, right eye 04/02/2021   Stroke ([place]) 04/2020   Thyroid disease    Past Surgical History:  Procedure Laterality Date   ENDARTERECTOMY Right 05/02/2020   Procedure: RIGHT CAROTID ENDARTERECTOMY;  Surgeon: Rosetta Posner, MD;  Location: [facility] OR;  Service: Vascular;  Laterality: Right;   EYE SURGERY  01/2020   SCLERAL BUCKLE Right 2004   TUBAL LIGATION  Feb 1982   Patient Active Problem List   Diagnosis Date Noted   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   Other female genital prolapse 10/29/2021   Immunization not carried out because of parent refusal 05/06/2021   Long-term use of Plaquenil 04/02/2021   Primary open angle glaucoma (POAG) of both eyes, severe stage 04/02/2021   White coat syndrome with diagnosis of  hypertension 01/11/2021   Hyperlipidemia LDL goal <70 01/11/2021   Acquired thrombophilia ([place]) 12/11/2020   BMI less than 19,adult 12/11/2020   Hypothyroidism 12/11/2020   History of CVA (cerebrovascular accident) 05/02/2020   Lupus ([place])    Allergic rhinitis    REFERRING DIAG: N81.2 (ICD-10-CM) - Incomplete uterovaginal prolapse R35.0 (ICD-10-CM) - Urinary frequency   THERAPY DIAG:  Muscle weakness (generalized)   Abnormal posture   Unspecified lack of coordination   Rationale for Evaluation and Treatment: Rehabilitation   ONSET DATE: July 2023   SUBJECTIVE:  SUBJECTIVE STATEMENT: I had the colonoscopy and went well.     PAIN:  Are you having pain? No     PRECAUTIONS: None   WEIGHT BEARING RESTRICTIONS: No   FALLS:  Has patient fallen in last 6 months? No   LIVING ENVIRONMENT: Lives with: lives with their family Lives in: House/apartment     OCCUPATION: retired    PLOF: Independent   PATIENT GOALS: to have decreased prolapse symptoms   PERTINENT HISTORY:  Stroke, thyroid disease, ENDARTERECTOMY, lupus Sexual abuse: No   BOWEL MOVEMENT: Pain with bowel movement: No Type of bowel movement:Type (Bristol Stool Scale) 6-5, Frequency usually 1-2x per day has slowed a little in recently to once per day, and Strain No (not usually sometimes) Fully empty rectum: No not always Leakage: Yes: unaware most of the time but notices it sometimes after bowel movement  Pads: No Fiber supplement: No   URINATION: Pain with urination: No Fully empty bladder: Yes:   Stream: Strong Urgency: Yes: mostly at night Frequency: in am may go quicker than every 2 hours, night 1-2x  Leakage:  only very occasionally with nighttime urgency  Pads: No   INTERCOURSE: Pain with intercourse:  not  active   PREGNANCY: Vaginal deliveries 2     PROLAPSE: Cystocele per pt, pressure felt vaginally, bulge felt there as well. Straining with voiding if she attempts she will feel it the most and sometimes with lifting.      OBJECTIVE:    DIAGNOSTIC FINDINGS:      COGNITION: Overall cognitive status: Within functional limits for tasks assessed                          SENSATION: Light touch: Appears intact Proprioception: Appears intact   MUSCLE LENGTH: Bil hamstrings and adductors limited by 25%     POSTURE: rounded shoulders, forward head, and posterior pelvic tilt       LUMBARAROM/PROM:   A/PROM A/PROM  eval  Flexion Limited by 25%  Extension WFL  Right lateral flexion Limited by 25%  Left lateral flexion Limited by 25%  Right rotation Limited by 25%  Left rotation Limited by 25%   (Blank rows = not tested)   LOWER EXTREMITY ROM:   WFL   LOWER EXTREMITY MMT:   Bil hips grossly 4/5, knees and ankles 5/5    PALPATION:   General  no TTP but did have fascial restrictions throughout abdomen                  External Perineal Exam dryness noted at vulva, tissue whitening                             Internal Pelvic Floor no pain, WFL   Patient confirms identification and approves PT to assess internal pelvic floor and treatment Yes   PELVIC MMT:   MMT eval 02/10/22  Vaginal 2/5, 3s, 3 reps 3/5 holding for 6 sec          TONE: WFL   PROLAPSE: In hooklying possible grade 2 anterior wall laxity noted with cough   TODAY'S TREATMENT:   03/21/22 Exercises: Strengthening:all exercises engage the lower abdomen and pelvic floor Nustep level 5 for 6 minutes while assessing patient Bridge with yellow band around knees and holding arms at shoulder height with blue band around wrist. 10x Marching with yellow band around knees and arms at shoulder height with  blue band around wrist. 10x Supine press ball between hands and knee fall put with yellow band around  knees Sidely press ball down with top hand and clam with yellow band around the knees.  Sit to stand holding 5# wt with cues to push knees outward and breath out Standing bilateral shoulder horizontal abduction  03/10/22 Exercises: Strengthening: Nustep level 5 for 6 minutes while assessing patient Contract and lean forward Sit to stand  and back with pelvic floor contraction LAQ with 4# on each leg with ball squeeze and contract pelvic floor.  Marching with 4# on each leg with ball between knees and pelvic floor contraction Standing bilateral shoulder extension with red band working on back strength     02/10/22 Manual: Internal pelvic floor techniques: No emotional/communication barriers or cognitive limitation. Patient is motivated to learn. Patient understands and agrees with treatment goals and plan. PT explains patient will be examined in standing, sitting, and lying down to see how their muscles and joints work. When they are ready, they will be asked to remove their underwear so PT can examine their perineum. The patient is also given the option of providing their own chaperone as one is not provided in our facility. The patient also has the right and is explained the right to defer or refuse any part of the evaluation or treatment including the internal exam. With the patient's consent, PT will use one gloved finger to gently assess the muscles of the pelvic floor, seeing how well it contracts and relaxes and if there is muscle symmetry. After, the patient will get dressed and PT and patient will discuss exam findings and plan of care. PT and patient discuss plan of care, schedule, attendance policy and HEP activities.  Manual work to the introitus, along the ischiocavernosus, along the perineal body, along the sides of the bladder, urethra sphincter.    Neuromuscular re-education: Pelvic floor contraction training: Tapping of the pelvic floor to work on circular contraction around the  therapist finger Down training: Educated patient on laying down when she feels the prolapse and toward the end of the day to reduce the strain on the prolapse Exercises: Strengthening: Nustep level 5 for 6 minutes while assessing patient Therapeutic activities: Functional strengthening activities: Instruction on keeping the rib cage and pubic bone apart and not reducing the distance to decrease pressure on the prolapse.  Squatting keeping the distance between the rib cage and pubic bone with flexion at hips Sitting with chest up and not slouching.           PATIENT EDUCATION:  03/10/22 Education details: Ardeth Perfect, educated on vaginal moisturizers, how to manage prolapse Person educated: Patient Education method: Explanation, Demonstration, Tactile cues, Verbal cues, and Handouts Education comprehension: verbalized understanding and returned demonstration   HOME EXERCISE PROGRAM: 03/10/22 Access Code: [email or web address] URL: [email or web address] Date: 03/10/2022 Prepared by: Earlie Counts   Program Notes walk more in the earlier day than later in the day.    Exercises -- Seated Exhale with Pelvic Floor Contraction and Hand to Mouth  - 1 x daily - 7 x weekly - 1 sets - 10 reps - Sit to Stand with Pelvic Floor Contraction  - 1 x daily - 7 x weekly - 1 sets - 10 reps   ASSESSMENT:   CLINICAL IMPRESSION: Patient is a 77 y.o. female  who was seen today for physical therapy  treatment for prolapse of anterior vaginal wall, urinary frequency. Patient reports her urgency with urination is less. Patient  had a little fecal leakage last night.  Patient is working on back strength to reduce her kyphosis and pressure on her bladder. She has difficulty going from sit to stand due to leg weakness. Patient will benefit from skilled therapy to improve pelvic floor strength and understands ways to manage prolapse.      OBJECTIVE IMPAIRMENTS: decreased coordination, decreased endurance,  decreased mobility, difficulty walking, decreased strength, increased fascial restrictions, impaired flexibility, improper body mechanics, and postural dysfunction.    ACTIVITY LIMITATIONS: lifting, standing, and continence   PARTICIPATION LIMITATIONS: community activity   PERSONAL FACTORS: Fitness and Time since onset of injury/illness/exacerbation are also affecting patient's functional outcome.    REHAB POTENTIAL: Good   CLINICAL DECISION MAKING: Stable/uncomplicated   EVALUATION COMPLEXITY: Low     GOALS: Goals reviewed with patient? Yes   SHORT TERM GOALS: Target date: 02/11/22   Pt to be I with HEP.  Baseline: Goal status: Met 02/03/22   2.  Pt will report her BMs are complete at least 50% of the time due to improved bowel habits and evacuation techniques.  Baseline:  Goal status: met 02/03/22   3.  Pt to demonstrate at least 3/5 pelvic floor strength for improved pelvic stability and decreased strain at pelvic floor/ decrease leakage.  Baseline:  Goal status: ongoing 03/21/22   4.  Pt will have 75% less urgency due to bladder retraining and strengthening  Baseline: improved by 50% Goal status: Ongoing 02/03/22     LONG TERM GOALS: Target date: 04/15/22   Pt to be I with advanced HEP.  Baseline:  Goal status: ongoing 03/21/22   2.  Pt to demonstrate at least 5/5 bil hip strength for improved pelvic stability and functional squats without leakage.  Baseline:  Goal status: ongoing 03/21/22   3.  Pt to demonstrate at least 4/5 pelvic floor strength for improved pelvic stability and decreased strain at pelvic floor/ decrease leakage.  Baseline:  Goal status: ongoing 03/21/22   4.  Pt to be I with prolapse relief positions, breathing mechanics and voiding mechanics to decrease strain at pelvic floor and prolapse without cues.  Baseline:  Goal status: Met 03/10/22   5.   Pt to demonstrate improved coordination of pelvic floor and breathing mechanics with 10# squats to  decreased strain at pelvic floor and prolapse Baseline:  Goal status: ongoing 03/21/22     PLAN:   PT FREQUENCY: 1x/week   PT DURATION:  8 sessions   PLANNED INTERVENTIONS: Therapeutic exercises, Therapeutic activity, Neuromuscular re-education, Patient/Family education, Self Care, Joint mobilization, Dry Needling, Electrical stimulation, Cryotherapy, Moist heat, scar mobilization, Taping, Biofeedback, and Manual therapy    PLAN FOR NEXT SESSION:  breathing mechanics, core/hip/pelvic floor strengthening and coordination, back strength to build up extension    Earlie Counts, PT 03/21/22 11:41 AM

## 2022-03-24 ENCOUNTER — Encounter: Payer: Self-pay | Admitting: Physical Therapy

## 2022-03-24 ENCOUNTER — Ambulatory Visit: Payer: Medicare Other | Admitting: Physical Therapy

## 2022-03-24 DIAGNOSIS — R293 Abnormal posture: Secondary | ICD-10-CM

## 2022-03-24 DIAGNOSIS — R279 Unspecified lack of coordination: Secondary | ICD-10-CM | POA: Diagnosis not present

## 2022-03-24 DIAGNOSIS — M6281 Muscle weakness (generalized): Secondary | ICD-10-CM | POA: Diagnosis not present

## 2022-03-24 NOTE — Therapy (Signed)
OUTPATIENT PHYSICAL THERAPY TREATMENT NOTE   Patient Name: Wendy Jackson MRN: [id number] DOB:[birth year], 77 y.o., female Today's Date: 03/24/2022  PCP: [name], DO  REFERRING PROVIDER: Nunzio Cobbs, MD   END OF SESSION:   PT End of Session - 03/24/22 1057     Visit Number 7    Date for PT Re-Evaluation 04/15/22    Authorization Type [employer] MCR    Authorization - Visit Number 6    Authorization - Number of Visits 10    PT Start Time 1100    PT Stop Time 1140    PT Time Calculation (min) 40 min    Activity Tolerance Patient tolerated treatment well    Behavior During Therapy [facility] for tasks assessed/performed             Past Medical History:  Diagnosis Date   Allergy Spring 2010   Cataract 03/2019   Glaucoma Approximately 2005   [place] retinal tear of left eye 04/02/2021   Hyperlipidemia LDL goal <70 01/11/2021   Hypertension 2022   Long-term use of Plaquenil 04/02/2021   Lupus ([place])    new rheum questioning original dx. continuing Plaquenil at lower dose for now.   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 bladder, acquired    Pseudophakia of both eyes 04/02/2021   Retinal detachment with multiple breaks, right eye 04/02/2021   Stroke ([place]) 04/2020   Thyroid disease    Past Surgical History:  Procedure Laterality Date   ENDARTERECTOMY Right 05/02/2020   Procedure: RIGHT CAROTID ENDARTERECTOMY;  Surgeon: Rosetta Posner, MD;  Location: [facility];  Service: Vascular;  Laterality: Right;   EYE SURGERY  01/2020   SCLERAL BUCKLE Right 2004   TUBAL LIGATION  Feb 1982   Patient Active Problem List   Diagnosis Date Noted   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   Other female genital prolapse 10/29/2021   Immunization not carried out because of parent refusal 05/06/2021   Long-term use of Plaquenil 04/02/2021   Primary open angle glaucoma (POAG) of both eyes, severe stage 04/02/2021   White coat syndrome with diagnosis of  hypertension 01/11/2021   Hyperlipidemia LDL goal <70 01/11/2021   Acquired thrombophilia ([place]) 12/11/2020   BMI less than 19,adult 12/11/2020   Hypothyroidism 12/11/2020   History of CVA (cerebrovascular accident) 05/02/2020   Lupus ([place])    Allergic rhinitis    REFERRING DIAG: N81.2 (ICD-10-CM) - Incomplete uterovaginal prolapse R35.0 (ICD-10-CM) - Urinary frequency   THERAPY DIAG:  Muscle weakness (generalized)   Abnormal posture   Unspecified lack of coordination   Rationale for Evaluation and Treatment: Rehabilitation   ONSET DATE: July 2023   SUBJECTIVE:  SUBJECTIVE STATEMENT: I had the colonoscopy and went well.     PAIN:  Are you having pain? No     PRECAUTIONS: None   WEIGHT BEARING RESTRICTIONS: No   FALLS:  Has patient fallen in last 6 months? No   LIVING ENVIRONMENT: Lives with: lives with their family Lives in: House/apartment     OCCUPATION: retired    PLOF: Independent   PATIENT GOALS: to have decreased prolapse symptoms   PERTINENT HISTORY:  Stroke, thyroid disease, ENDARTERECTOMY, lupus Sexual abuse: No   BOWEL MOVEMENT: Pain with bowel movement: No Type of bowel movement:Type (Bristol Stool Scale) 6-5, Frequency usually 1-2x per day has slowed a little in recently to once per day, and Strain No (not usually sometimes) Fully empty rectum: No not always Leakage: Yes: unaware most of the time but notices it sometimes after bowel movement  Pads: No Fiber supplement: No   URINATION: Pain with urination: No Fully empty bladder: Yes:   Stream: Strong Urgency: Yes: mostly at night Frequency: in am may go quicker than every 2 hours, night 1-2x  Leakage:  only very occasionally with nighttime urgency  Pads: No   INTERCOURSE: Pain with intercourse:  not  active   PREGNANCY: Vaginal deliveries 2     PROLAPSE: Cystocele per pt, pressure felt vaginally, bulge felt there as well. Straining with voiding if she attempts she will feel it the most and sometimes with lifting.      OBJECTIVE:    DIAGNOSTIC FINDINGS:      COGNITION: Overall cognitive status: Within functional limits for tasks assessed                          SENSATION: Light touch: Appears intact Proprioception: Appears intact   MUSCLE LENGTH: Bil hamstrings and adductors limited by 25%     POSTURE: rounded shoulders, forward head, and posterior pelvic tilt       LUMBARAROM/PROM:   A/PROM A/PROM  eval  Flexion Limited by 25%  Extension WFL  Right lateral flexion Limited by 25%  Left lateral flexion Limited by 25%  Right rotation Limited by 25%  Left rotation Limited by 25%   (Blank rows = not tested)   LOWER EXTREMITY ROM:   WFL   LOWER EXTREMITY MMT:   Bil hips grossly 4/5, knees and ankles 5/5    PALPATION:   General  no TTP but did have fascial restrictions throughout abdomen                  External Perineal Exam dryness noted at vulva, tissue whitening                             Internal Pelvic Floor no pain, WFL   Patient confirms identification and approves PT to assess internal pelvic floor and treatment Yes   PELVIC MMT:   MMT eval 02/10/22  Vaginal 2/5, 3s, 3 reps 3/5 holding for 6 sec          TONE: WFL   PROLAPSE: In hooklying possible grade 2 anterior wall laxity noted with cough   TODAY'S TREATMENT:   03/24/22 Exercises: Stretches/mobility: Strengthening: all exercises contracting the pelvic floor Nustep level 5 for 7 minutes while assessing patient Standing bilateral shoulder extension with forward leg having the red band around to add in hip adductors 15x each side Standing bilateral shoulder horizontal abduction with red band and legs off  set 2x 10 Side step on 6 inch step 10x each way Sit to stand with green band  around the knees on 20 inch block Sidely press ball down with top hand and clam with green loop band around the knees.  Bridge with green loop around knees and bring knees in and out while pressing ball between her hands   03/21/22 Exercises: Strengthening:all exercises engage the lower abdomen and pelvic floor Nustep level 5 for 6 minutes while assessing patient Bridge with yellow band around knees and holding arms at shoulder height with blue band around wrist. 10x Marching with yellow band around knees and arms at shoulder height with blue band around wrist. 10x Supine press ball between hands and knee fall put with yellow band around knees Sidely press ball down with top hand and clam with yellow band around the knees.  Sit to stand holding 5# wt with cues to push knees outward and breath out Standing bilateral shoulder horizontal abduction   03/10/22 Exercises: Strengthening: Nustep level 5 for 6 minutes while assessing patient Contract and lean forward Sit to stand  and back with pelvic floor contraction LAQ with 4# on each leg with ball squeeze and contract pelvic floor.  Marching with 4# on each leg with ball between knees and pelvic floor contraction Standing bilateral shoulder extension with red band working on back strength     02/10/22 Manual: Internal pelvic floor techniques: No emotional/communication barriers or cognitive limitation. Patient is motivated to learn. Patient understands and agrees with treatment goals and plan. PT explains patient will be examined in standing, sitting, and lying down to see how their muscles and joints work. When they are ready, they will be asked to remove their underwear so PT can examine their perineum. The patient is also given the option of providing their own chaperone as one is not provided in our facility. The patient also has the right and is explained the right to defer or refuse any part of the evaluation or treatment including the  internal exam. With the patient's consent, PT will use one gloved finger to gently assess the muscles of the pelvic floor, seeing how well it contracts and relaxes and if there is muscle symmetry. After, the patient will get dressed and PT and patient will discuss exam findings and plan of care. PT and patient discuss plan of care, schedule, attendance policy and HEP activities.  Manual work to the introitus, along the ischiocavernosus, along the perineal body, along the sides of the bladder, urethra sphincter.    Neuromuscular re-education: Pelvic floor contraction training: Tapping of the pelvic floor to work on circular contraction around the therapist finger Down training: Educated patient on laying down when she feels the prolapse and toward the end of the day to reduce the strain on the prolapse Exercises: Strengthening: Nustep level 5 for 6 minutes while assessing patient Therapeutic activities: Functional strengthening activities: Instruction on keeping the rib cage and pubic bone apart and not reducing the distance to decrease pressure on the prolapse.  Squatting keeping the distance between the rib cage and pubic bone with flexion at hips Sitting with chest up and not slouching.           PATIENT EDUCATION:  03/10/22 Education details: Ardeth Perfect, educated on vaginal moisturizers, how to manage prolapse Person educated: Patient Education method: Explanation, Demonstration, Tactile cues, Verbal cues, and Handouts Education comprehension: verbalized understanding and returned demonstration   HOME EXERCISE PROGRAM: 03/10/22 Access Code: [email or web address] URL: [email or web address] Date:  03/10/2022 Prepared by: Earlie Counts   Program Notes walk more in the earlier day than later in the day.    Exercises -- Seated Exhale with Pelvic Floor Contraction and Hand to Mouth  - 1 x daily - 7 x weekly - 1 sets - 10 reps - Sit to Stand with Pelvic Floor Contraction  - 1 x daily - 7  x weekly - 1 sets - 10 reps   ASSESSMENT:   CLINICAL IMPRESSION: Patient is a 77 y.o. female  who was seen today for physical therapy  treatment for prolapse of anterior vaginal wall, urinary frequency. Patient reports her urgency with urination is less by 70 %. Patient had a little fecal leakage last night and has happen since she has had her colonoscopy.   Patient is working on back strength to reduce her kyphosis and pressure on her bladder. She has difficulty going from sit to stand due to leg weakness. Patient will benefit from skilled therapy to improve pelvic floor strength and understands ways to manage prolapse.      OBJECTIVE IMPAIRMENTS: decreased coordination, decreased endurance, decreased mobility, difficulty walking, decreased strength, increased fascial restrictions, impaired flexibility, improper body mechanics, and postural dysfunction.    ACTIVITY LIMITATIONS: lifting, standing, and continence   PARTICIPATION LIMITATIONS: community activity   PERSONAL FACTORS: Fitness and Time since onset of injury/illness/exacerbation are also affecting patient's functional outcome.    REHAB POTENTIAL: Good   CLINICAL DECISION MAKING: Stable/uncomplicated   EVALUATION COMPLEXITY: Low     GOALS: Goals reviewed with patient? Yes   SHORT TERM GOALS: Target date: 02/11/22   Pt to be I with HEP.  Baseline: Goal status: Met 03/24/22   2.  Pt will report her BMs are complete at least 50% of the time due to improved bowel habits and evacuation techniques.  Baseline: some stool leakage since she had her colonoscopy due to stool being loose.  Goal status: met 03/24/22   3.  Pt to demonstrate at least 3/5 pelvic floor strength for improved pelvic stability and decreased strain at pelvic floor/ decrease leakage.  Baseline:  Goal status: ongoing 03/24/22   4.  Pt will have 75% less urgency due to bladder retraining and strengthening  Baseline: improved by 70% Goal status: Ongoing  03/24/22     LONG TERM GOALS: Target date: 04/15/22   Pt to be I with advanced HEP.  Baseline:  Goal status: ongoing 03/21/22   2.  Pt to demonstrate at least 5/5 bil hip strength for improved pelvic stability and functional squats without leakage.  Baseline:  Goal status: ongoing 03/21/22   3.  Pt to demonstrate at least 4/5 pelvic floor strength for improved pelvic stability and decreased strain at pelvic floor/ decrease leakage.  Baseline:  Goal status: ongoing 03/21/22   4.  Pt to be I with prolapse relief positions, breathing mechanics and voiding mechanics to decrease strain at pelvic floor and prolapse without cues.  Baseline:  Goal status: Met 03/10/22   5.   Pt to demonstrate improved coordination of pelvic floor and breathing mechanics with 10# squats to decreased strain at pelvic floor and prolapse Baseline:  Goal status: ongoing 03/21/22     PLAN:   PT FREQUENCY: 1x/week   PT DURATION:  8 sessions   PLANNED INTERVENTIONS: Therapeutic exercises, Therapeutic activity, Neuromuscular re-education, Patient/Family education, Self Care, Joint mobilization, Dry Needling, Electrical stimulation, Cryotherapy, Moist heat, scar mobilization, Taping, Biofeedback, and Manual therapy    PLAN FOR NEXT SESSION:  breathing mechanics, core/hip/pelvic floor strengthening and coordination, back strength to build up extension; add new exercises to HEP   Earlie Counts, PT 03/24/22 12:27 PM

## 2022-03-31 ENCOUNTER — Encounter: Payer: Self-pay | Admitting: Physical Therapy

## 2022-03-31 ENCOUNTER — Ambulatory Visit: Payer: Medicare Other | Admitting: Physical Therapy

## 2022-03-31 DIAGNOSIS — R293 Abnormal posture: Secondary | ICD-10-CM | POA: Diagnosis not present

## 2022-03-31 DIAGNOSIS — M6281 Muscle weakness (generalized): Secondary | ICD-10-CM

## 2022-03-31 DIAGNOSIS — R279 Unspecified lack of coordination: Secondary | ICD-10-CM | POA: Diagnosis not present

## 2022-03-31 NOTE — Therapy (Signed)
OUTPATIENT PHYSICAL THERAPY TREATMENT NOTE   Patient Name: Wendy Jackson MRN: [id number] DOB:1945-05-25, 77 y.o., female Today's Date: 03/31/2022  PCP: [name], DO  REFERRING PROVIDER: Nunzio Cobbs, MD   END OF SESSION:   PT End of Session - 03/31/22 1143     Visit Number 8    Date for PT Re-Evaluation 04/15/22    Authorization Type [employer] MCR    Authorization - Visit Number 7    Authorization - Number of Visits 10    PT Start Time [id number]    PT Stop Time 1225    PT Time Calculation (min) 40 min    Activity Tolerance Patient tolerated treatment well    Behavior During Therapy [facility] for tasks assessed/performed             Past Medical History:  Diagnosis Date   Allergy Spring 2010   Cataract 03/2019   Glaucoma Approximately 2005   [place] retinal tear of left eye 04/02/2021   Hyperlipidemia LDL goal <70 01/11/2021   Hypertension 2022   Long-term use of Plaquenil 04/02/2021   Lupus ([place])    new rheum questioning original dx. continuing Plaquenil at lower dose for now.   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 bladder, acquired    Pseudophakia of both eyes 04/02/2021   Retinal detachment with multiple breaks, right eye 04/02/2021   Stroke ([place]) 04/2020   Thyroid disease    Past Surgical History:  Procedure Laterality Date   ENDARTERECTOMY Right 05/02/2020   Procedure: RIGHT CAROTID ENDARTERECTOMY;  Surgeon: Rosetta Posner, MD;  Location: [facility] OR;  Service: Vascular;  Laterality: Right;   EYE SURGERY  01/2020   SCLERAL BUCKLE Right 2004   TUBAL LIGATION  Feb 1982   Patient Active Problem List   Diagnosis Date Noted   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   Other female genital prolapse 10/29/2021   Immunization not carried out because of parent refusal 05/06/2021   Long-term use of Plaquenil 04/02/2021   Primary open angle glaucoma (POAG) of both eyes, severe stage 04/02/2021   White coat syndrome with diagnosis of  hypertension 01/11/2021   Hyperlipidemia LDL goal <70 01/11/2021   Acquired thrombophilia ([place]) 12/11/2020   BMI less than 19,adult 12/11/2020   Hypothyroidism 12/11/2020   History of CVA (cerebrovascular accident) 05/02/2020   Lupus ([place])    Allergic rhinitis    REFERRING DIAG: N81.2 (ICD-10-CM) - Incomplete uterovaginal prolapse R35.0 (ICD-10-CM) - Urinary frequency   THERAPY DIAG:  Muscle weakness (generalized)   Abnormal posture   Unspecified lack of coordination   Rationale for Evaluation and Treatment: Rehabilitation   ONSET DATE: July 2023   SUBJECTIVE:  SUBJECTIVE STATEMENT: The fecal leakage is worse singe the colonoscopy.    PAIN:  Are you having pain? No     PRECAUTIONS: None   WEIGHT BEARING RESTRICTIONS: No   FALLS:  Has patient fallen in last 6 months? No   LIVING ENVIRONMENT: Lives with: lives with their family Lives in: House/apartment     OCCUPATION: retired    PLOF: Independent   PATIENT GOALS: to have decreased prolapse symptoms   PERTINENT HISTORY:  Stroke, thyroid disease, ENDARTERECTOMY, lupus Sexual abuse: No   BOWEL MOVEMENT: Pain with bowel movement: No Type of bowel movement:Type (Bristol Stool Scale) 6-5, Frequency usually 1-2x per day has slowed a little in recently to once per day, and Strain No (not usually sometimes) Fully empty rectum: No not always Leakage: Yes: unaware most of the time but notices it sometimes after bowel movement  Pads: No Fiber supplement: No   URINATION: Pain with urination: No Fully empty bladder: Yes:   Stream: Strong Urgency: Yes: mostly at night Frequency: in am may go quicker than every 2 hours, night 1-2x  Leakage:  only very occasionally with nighttime urgency  Pads: No   INTERCOURSE: Pain with  intercourse:  not active   PREGNANCY: Vaginal deliveries 2     PROLAPSE: Cystocele per pt, pressure felt vaginally, bulge felt there as well. Straining with voiding if she attempts she will feel it the most and sometimes with lifting.      OBJECTIVE:    DIAGNOSTIC FINDINGS:      COGNITION: Overall cognitive status: Within functional limits for tasks assessed                          SENSATION: Light touch: Appears intact Proprioception: Appears intact   MUSCLE LENGTH: Bil hamstrings and adductors limited by 25%     POSTURE: rounded shoulders, forward head, and posterior pelvic tilt       LUMBARAROM/PROM:   A/PROM A/PROM  eval  Flexion Limited by 25%  Extension WFL  Right lateral flexion Limited by 25%  Left lateral flexion Limited by 25%  Right rotation Limited by 25%  Left rotation Limited by 25%   (Blank rows = not tested)   LOWER EXTREMITY ROM:   WFL   LOWER EXTREMITY MMT:   Bil hips grossly 4/5, knees and ankles 5/5    PALPATION:   General  no TTP but did have fascial restrictions throughout abdomen                  External Perineal Exam dryness noted at vulva, tissue whitening                             Internal Pelvic Floor no pain, WFL   Patient confirms identification and approves PT to assess internal pelvic floor and treatment Yes   PELVIC MMT:   MMT eval 02/10/22  Vaginal 2/5, 3s, 3 reps 3/5 holding for 6 sec          TONE: WFL   PROLAPSE: In hooklying possible grade 2 anterior wall laxity noted with cough   TODAY'S TREATMENT:   03/31/22 Exercises: Strengthening: (all exercises with pelvic floor contraction) Nustep level 5 for 7 minutes while assessing patient Side step on 6 inch step 15x each side Standing bilateral shoulder extension with forward leg having the red band around to add in hip adductors 15x each side Standing bilateral shoulder  horizontal abduction with red band and legs off set 2x 10 with forward leg having the  red band around to add in hip adductors Sit to stand with green band around the knees on 20 inch block Leg press 50# 3x10  Contracting the anus for 10 sec 10x Bridge with loop around knees 15x  03/24/22 Exercises: Stretches/mobility: Strengthening: all exercises contracting the pelvic floor Nustep level 5 for 7 minutes while assessing patient Standing bilateral shoulder extension with forward leg having the red band around to add in hip adductors 15x each side Standing bilateral shoulder horizontal abduction with red band and legs off set 2x 10 Side step on 6 inch step 10x each way Sit to stand with green band around the knees on 20 inch block Sidely press ball down with top hand and clam with green loop band around the knees.  Bridge with green loop around knees and bring knees in and out while pressing ball between her hands   03/21/22 Exercises: Strengthening:all exercises engage the lower abdomen and pelvic floor Nustep level 5 for 6 minutes while assessing patient Bridge with yellow band around knees and holding arms at shoulder height with blue band around wrist. 10x Marching with yellow band around knees and arms at shoulder height with blue band around wrist. 10x Supine press ball between hands and knee fall put with yellow band around knees Sidely press ball down with top hand and clam with yellow band around the knees.  Sit to stand holding 5# wt with cues to push knees outward and breath out Standing bilateral shoulder horizontal abduction        PATIENT EDUCATION:  03/31/22 Education details: Ardeth Perfect, educated on vaginal moisturizers, how to manage prolapse Person educated: Patient Education method: Explanation, Demonstration, Tactile cues, Verbal cues, and Handouts Education comprehension: verbalized understanding and returned demonstration   HOME EXERCISE PROGRAM: 03/31/22 Access Code: [email or web address] URL: [email or web address] Date: 03/31/2022 Prepared by:  Earlie Counts  Program Notes walk more in the earlier day than later in the day.   Exercises - Supine Diaphragmatic Breathing  - 1 x daily - 7 x weekly - 3 sets - 10 reps - Supine Pelvic Floor Contraction  - 1 x daily - 7 x weekly - 3 sets - 10 reps - Diaphragmatic Breathing with Hips Elevated (for Pelvic Organ Prolapse)  - 2 x daily - 7 x weekly - 1 sets - 10 reps - Pelvic Floor Muscle Contraction and Adductor Squeeze With Hips Elevated (for Pelvic Organ Prolapse)  - 2 x daily - 7 x weekly - 1 sets - 10 reps - 5 sec hold - Pelvic Floor Muscle Contraction With Resisted Abduction With Hips Elevated (for Pelvic Organ Prolapse)  - 2 x daily - 7 x weekly - 1 sets - 10 reps - Supine Bridge with Pelvic Floor Contraction  - 1 x daily - 7 x weekly - 1 sets - 10 reps - Seated Exhale with Pelvic Floor Contraction and Hand to Mouth  - 1 x daily - 7 x weekly - 1 sets - 10 reps - Sit to Stand with Pelvic Floor Contraction  - 1 x daily - 7 x weekly - 1 sets - 10 reps - Lateral Step Up with Unilateral Counter Support  - 1 x daily - 3 x weekly - 2 sets - 10 reps - Standing Shoulder Extension with Resistance  - 1 x daily - 3 x weekly - 1 sets - 10 reps - Standing Shoulder Horizontal Abduction with  Anchored Resistance  - 1 x daily - 3 x weekly - 1 sets - 10 reps    ASSESSMENT:   CLINICAL IMPRESSION: Patient is a 77 y.o. female  who was seen today for physical therapy  treatment for prolapse of anterior vaginal wall, urinary frequency.  Patient has had increased in fecal leakage since the colonoscopy. She is standing with less thoracic kyphosis to decreased stress on the bladder. Patient is able to go from sit and stand and back with greater ease. She is doing well with her exercises and not fatigued. Patient will benefit from skilled therapy to improve pelvic floor strength and understands ways to manage prolapse.      OBJECTIVE IMPAIRMENTS: decreased coordination, decreased endurance, decreased mobility,  difficulty walking, decreased strength, increased fascial restrictions, impaired flexibility, improper body mechanics, and postural dysfunction.    ACTIVITY LIMITATIONS: lifting, standing, and continence   PARTICIPATION LIMITATIONS: community activity   PERSONAL FACTORS: Fitness and Time since onset of injury/illness/exacerbation are also affecting patient's functional outcome.    REHAB POTENTIAL: Good   CLINICAL DECISION MAKING: Stable/uncomplicated   EVALUATION COMPLEXITY: Low     GOALS: Goals reviewed with patient? Yes   SHORT TERM GOALS: Target date: 02/11/22   Pt to be I with HEP.  Baseline: Goal status: Met 03/24/22   2.  Pt will report her BMs are complete at least 50% of the time due to improved bowel habits and evacuation techniques.  Baseline: some stool leakage since she had her colonoscopy due to stool being loose.  Goal status: met 03/24/22   3.  Pt to demonstrate at least 3/5 pelvic floor strength for improved pelvic stability and decreased strain at pelvic floor/ decrease leakage.  Baseline:  Goal status: ongoing 03/24/22   4.  Pt will have 75% less urgency due to bladder retraining and strengthening  Baseline: improved by 70% Goal status: Ongoing 03/24/22     LONG TERM GOALS: Target date: 04/15/22   Pt to be I with advanced HEP.  Baseline:  Goal status: ongoing 03/21/22   2.  Pt to demonstrate at least 5/5 bil hip strength for improved pelvic stability and functional squats without leakage.  Baseline:  Goal status: ongoing 03/21/22   3.  Pt to demonstrate at least 4/5 pelvic floor strength for improved pelvic stability and decreased strain at pelvic floor/ decrease leakage.  Baseline:  Goal status: ongoing 03/21/22   4.  Pt to be I with prolapse relief positions, breathing mechanics and voiding mechanics to decrease strain at pelvic floor and prolapse without cues.  Baseline:  Goal status: Met 03/10/22   5.   Pt to demonstrate improved coordination of pelvic  floor and breathing mechanics with 10# squats to decreased strain at pelvic floor and prolapse Baseline:  Goal status: ongoing 03/21/22     PLAN:   PT FREQUENCY: 1x/week   PT DURATION:  8 sessions   PLANNED INTERVENTIONS: Therapeutic exercises, Therapeutic activity, Neuromuscular re-education, Patient/Family education, Self Care, Joint mobilization, Dry Needling, Electrical stimulation, Cryotherapy, Moist heat, scar mobilization, Taping, Biofeedback, and Manual therapy    PLAN FOR NEXT SESSION:  breathing mechanics, core/hip/pelvic floor strengthening and coordination, back strength to build up extension; check on urgency and urinary leakage   Earlie Counts, PT 03/31/22 1:17 PM

## 2022-04-04 ENCOUNTER — Telehealth: Payer: Self-pay | Admitting: Gastroenterology

## 2022-04-04 NOTE — Telephone Encounter (Signed)
Inbound call from patient requesting to speak to a nurse in regards pantoprazole , she want to know if she continue taking it .Please advise

## 2022-04-07 ENCOUNTER — Encounter: Payer: Self-pay | Admitting: Physical Therapy

## 2022-04-07 ENCOUNTER — Ambulatory Visit: Payer: Medicare Other | Admitting: Physical Therapy

## 2022-04-07 DIAGNOSIS — M6281 Muscle weakness (generalized): Secondary | ICD-10-CM

## 2022-04-07 DIAGNOSIS — R293 Abnormal posture: Secondary | ICD-10-CM | POA: Diagnosis not present

## 2022-04-07 DIAGNOSIS — R279 Unspecified lack of coordination: Secondary | ICD-10-CM

## 2022-04-07 NOTE — Telephone Encounter (Signed)
Patient called again requesting to speak to nurse regarding pantoprazole. Please advise.

## 2022-04-07 NOTE — Telephone Encounter (Signed)
Patient advised to continue her Pantoprazole until she is seen at her follow-up with Dr.Nandigam. She can discuss the need to continue or discontinue with Dr.Nandigam at that time.  Pt voiced understanding.

## 2022-04-07 NOTE — Therapy (Signed)
OUTPATIENT PHYSICAL THERAPY TREATMENT NOTE   Patient Name: Wendy Jackson MRN: [id number] DOB:1945/07/24, 77 y.o., female Today's Date: 04/07/2022  PCP: [name], DO  REFERRING PROVIDER: Nunzio Cobbs, MD   END OF SESSION:   PT End of Session - 04/07/22 1148     Visit Number 9    Date for PT Re-Evaluation 04/15/22    Authorization Type BC[employer] MCR    Authorization - Visit Number 8    Authorization - Number of Visits 10    PT Start Time [id number]    PT Stop Time 1225    PT Time Calculation (min) 40 min    Activity Tolerance Patient tolerated treatment well    Behavior During Therapy [facility] for tasks assessed/performed             Past Medical History:  Diagnosis Date   Allergy Spring 2010   Cataract 03/2019   Glaucoma Approximately 2005   Horseshoe retinal tear of left eye 04/02/2021   Hyperlipidemia LDL goal <70 01/11/2021   Hypertension 2022   Long-term use of Plaquenil 04/02/2021   Lupus ([place])    new rheum questioning original dx. continuing Plaquenil at lower dose for now.   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 bladder, acquired    Pseudophakia of both eyes 04/02/2021   Retinal detachment with multiple breaks, right eye 04/02/2021   Stroke ([place]) 04/2020   Thyroid disease    Past Surgical History:  Procedure Laterality Date   ENDARTERECTOMY Right 05/02/2020   Procedure: RIGHT CAROTID ENDARTERECTOMY;  Surgeon: Rosetta Posner, MD;  Location: [facility] OR;  Service: Vascular;  Laterality: Right;   EYE SURGERY  01/2020   SCLERAL BUCKLE Right 2004   TUBAL LIGATION  Feb 1982   Patient Active Problem List   Diagnosis Date Noted   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   Other female genital prolapse 10/29/2021   Immunization not carried out because of parent refusal 05/06/2021   Long-term use of Plaquenil 04/02/2021   Primary open angle glaucoma (POAG) of both eyes, severe stage 04/02/2021   White coat syndrome with diagnosis of  hypertension 01/11/2021   Hyperlipidemia LDL goal <70 01/11/2021   Acquired thrombophilia ([place]) 12/11/2020   BMI less than 19,adult 12/11/2020   Hypothyroidism 12/11/2020   History of CVA (cerebrovascular accident) 05/02/2020   Lupus ([place])    Allergic rhinitis    REFERRING DIAG: N81.2 (ICD-10-CM) - Incomplete uterovaginal prolapse R35.0 (ICD-10-CM) - Urinary frequency   THERAPY DIAG:  Muscle weakness (generalized)   Abnormal posture   Unspecified lack of coordination   Rationale for Evaluation and Treatment: Rehabilitation   ONSET DATE: July 2023   SUBJECTIVE:  SUBJECTIVE STATEMENT: I felt alright from last visit. My prolapse is worse.    PAIN:  Are you having pain? No     PRECAUTIONS: None   WEIGHT BEARING RESTRICTIONS: No   FALLS:  Has patient fallen in last 6 months? No   LIVING ENVIRONMENT: Lives with: lives with their family Lives in: House/apartment     OCCUPATION: retired    PLOF: Independent   PATIENT GOALS: to have decreased prolapse symptoms   PERTINENT HISTORY:  Stroke, thyroid disease, ENDARTERECTOMY, lupus Sexual abuse: No   BOWEL MOVEMENT: Pain with bowel movement: No Type of bowel movement:Type (Bristol Stool Scale) 6-5, Frequency usually 1-2x per day has slowed a little in recently to once per day, and Strain No (not usually sometimes) Fully empty rectum: No not always Leakage: Yes: unaware most of the time but notices it sometimes after bowel movement  Pads: No Fiber supplement: No   URINATION: Pain with urination: No Fully empty bladder: Yes:   Stream: Strong Urgency: Yes: mostly at night Frequency: in am may go quicker than every 2 hours, night 1-2x  Leakage:  only very occasionally with nighttime urgency  Pads: No   INTERCOURSE: Pain with  intercourse:  not active   PREGNANCY: Vaginal deliveries 2     PROLAPSE: Cystocele per pt, pressure felt vaginally, bulge felt there as well. Straining with voiding if she attempts she will feel it the most and sometimes with lifting.      OBJECTIVE:    DIAGNOSTIC FINDINGS:      COGNITION: Overall cognitive status: Within functional limits for tasks assessed                          SENSATION: Light touch: Appears intact Proprioception: Appears intact   MUSCLE LENGTH: Bil hamstrings and adductors limited by 25%     POSTURE: rounded shoulders, forward head, and posterior pelvic tilt       LUMBARAROM/PROM:   A/PROM A/PROM  eval  Flexion Limited by 25%  Extension WFL  Right lateral flexion Limited by 25%  Left lateral flexion Limited by 25%  Right rotation Limited by 25%  Left rotation Limited by 25%   (Blank rows = not tested)   LOWER EXTREMITY ROM:   WFL   LOWER EXTREMITY MMT:   Bil hips grossly 4/5, knees and ankles 5/5    PALPATION:   General  no TTP but did have fascial restrictions throughout abdomen                  External Perineal Exam dryness noted at vulva, prolapse a little past the introitus in supine and red structure out of the urethra.                             Internal Pelvic Floor no pain, WFL   Patient confirms identification and approves PT to assess internal pelvic floor and treatment Yes   PELVIC MMT:   MMT eval 02/10/22 04/07/22  Vaginal 2/5, 3s, 3 reps 3/5 holding for 6 sec 3/5 for 8 sec          TONE: WFL   PROLAPSE: In hooklying possible grade 2 anterior wall laxity noted with cough   TODAY'S TREATMENT:   04/07/22 Manual: Internal pelvic floor techniques: No emotional/communication barriers or cognitive limitation. Patient is motivated to learn. Patient understands and agrees with treatment goals and plan. PT explains patient will  be examined in standing, sitting, and lying down to see how their muscles and joints work.  When they are ready, they will be asked to remove their underwear so PT can examine their perineum. The patient is also given the option of providing their own chaperone as one is not provided in our facility. The patient also has the right and is explained the right to defer or refuse any part of the evaluation or treatment including the internal exam. With the patient's consent, PT will use one gloved finger to gently assess the muscles of the pelvic floor, seeing how well it contracts and relaxes and if there is muscle symmetry. After, the patient will get dressed and PT and patient will discuss exam findings and plan of care. PT and patient discuss plan of care, schedule, attendance policy and HEP activities.  Going through the vagina and working on the sides of the introitus to improve pelvic floor contraction. Started with 2/5 then ended with 3/5 holding for 8 seconds and hug of therapist finger improved  Educated patient on using vaginal moisturizers to work on vaginal health and reduce the dryness.  Exercises: Strengthening: Nustep level 5 for 7 minutes while assessing patient Pelvic floor contraction in supine holding for 6 sec 10x Educated patient on what a pessary is and how it works and can assist with pelvic floor contracting due to the prolapse not in the way of the muscle contraction.   03/31/22 Exercises: Strengthening: (all exercises with pelvic floor contraction) Nustep level 5 for 7 minutes while assessing patient Side step on 6 inch step 15x each side Standing bilateral shoulder extension with forward leg having the red band around to add in hip adductors 15x each side Standing bilateral shoulder horizontal abduction with red band and legs off set 2x 10 with forward leg having the red band around to add in hip adductors Sit to stand with green band around the knees on 20 inch block Leg press 50# 3x10  Contracting the anus for 10 sec 10x Bridge with loop around knees 15x    03/24/22 Exercises: Stretches/mobility: Strengthening: all exercises contracting the pelvic floor Nustep level 5 for 7 minutes while assessing patient Standing bilateral shoulder extension with forward leg having the red band around to add in hip adductors 15x each side Standing bilateral shoulder horizontal abduction with red band and legs off set 2x 10 Side step on 6 inch step 10x each way Sit to stand with green band around the knees on 20 inch block Sidely press ball down with top hand and clam with green loop band around the knees.  Bridge with green loop around knees and bring knees in and out while pressing ball between her hands        PATIENT EDUCATION:  03/31/22 Education details: Ardeth Perfect, educated on vaginal moisturizers, how to manage prolapse Person educated: Patient Education method: Explanation, Demonstration, Tactile cues, Verbal cues, and Handouts Education comprehension: verbalized understanding and returned demonstration   HOME EXERCISE PROGRAM: 03/31/22 Access Code: [email or web address] URL: [email or web address] Date: 03/31/2022 Prepared by: Earlie Counts   Program Notes walk more in the earlier day than later in the day.    Exercises - Supine Diaphragmatic Breathing  - 1 x daily - 7 x weekly - 3 sets - 10 reps - Supine Pelvic Floor Contraction  - 1 x daily - 7 x weekly - 3 sets - 10 reps - Diaphragmatic Breathing with Hips Elevated (for Pelvic Organ Prolapse)  - 2 x daily -  7 x weekly - 1 sets - 10 reps - Pelvic Floor Muscle Contraction and Adductor Squeeze With Hips Elevated (for Pelvic Organ Prolapse)  - 2 x daily - 7 x weekly - 1 sets - 10 reps - 5 sec hold - Pelvic Floor Muscle Contraction With Resisted Abduction With Hips Elevated (for Pelvic Organ Prolapse)  - 2 x daily - 7 x weekly - 1 sets - 10 reps - Supine Bridge with Pelvic Floor Contraction  - 1 x daily - 7 x weekly - 1 sets - 10 reps - Seated Exhale with Pelvic Floor Contraction and Hand to  Mouth  - 1 x daily - 7 x weekly - 1 sets - 10 reps - Sit to Stand with Pelvic Floor Contraction  - 1 x daily - 7 x weekly - 1 sets - 10 reps - Lateral Step Up with Unilateral Counter Support  - 1 x daily - 3 x weekly - 2 sets - 10 reps - Standing Shoulder Extension with Resistance  - 1 x daily - 3 x weekly - 1 sets - 10 reps - Standing Shoulder Horizontal Abduction with Anchored Resistance  - 1 x daily - 3 x weekly - 1 sets - 10 reps     ASSESSMENT:   CLINICAL IMPRESSION: Patient is a 77 y.o. female  who was seen today for physical therapy  treatment for prolapse of anterior vaginal wall, urinary frequency.  Patient has improved urgency. She will only leak walking to the bathroom when she is half asleep. She is having less fecal leakage since her colonoscopy. Pelvic floor strength is 3/5 for 8 seconds. Her prolapse is past the introitus in supine. She was educated on pessaries and how they can help with bladder. She was educated on using the vaginal moisturizers to assist with tissue health and strength. Patient will benefit from skilled therapy to improve pelvic floor strength and understands ways to manage prolapse.      OBJECTIVE IMPAIRMENTS: decreased coordination, decreased endurance, decreased mobility, difficulty walking, decreased strength, increased fascial restrictions, impaired flexibility, improper body mechanics, and postural dysfunction.    ACTIVITY LIMITATIONS: lifting, standing, and continence   PARTICIPATION LIMITATIONS: community activity   PERSONAL FACTORS: Fitness and Time since onset of injury/illness/exacerbation are also affecting patient's functional outcome.    REHAB POTENTIAL: Good   CLINICAL DECISION MAKING: Stable/uncomplicated   EVALUATION COMPLEXITY: Low     GOALS: Goals reviewed with patient? Yes   SHORT TERM GOALS: Target date: 02/11/22   Pt to be I with HEP.  Baseline: Goal status: Met 03/24/22   2.  Pt will report her BMs are complete at least 50%  of the time due to improved bowel habits and evacuation techniques.  Baseline: some stool leakage since she had her colonoscopy due to stool being loose.  Goal status: met 03/24/22   3.  Pt to demonstrate at least 3/5 pelvic floor strength for improved pelvic stability and decreased strain at pelvic floor/ decrease leakage.  Baseline:  Goal status: ongoing 03/24/22   4.  Pt will have 75% less urgency due to bladder retraining and strengthening  Baseline: improved by 70% Goal status: met 04/07/22    LONG TERM GOALS: Target date: 04/15/22   Pt to be I with advanced HEP.  Baseline:  Goal status: ongoing 03/21/22   2.  Pt to demonstrate at least 5/5 bil hip strength for improved pelvic stability and functional squats without leakage.  Baseline:  Goal status: ongoing 03/21/22  3.  Pt to demonstrate at least 4/5 pelvic floor strength for improved pelvic stability and decreased strain at pelvic floor/ decrease leakage.  Baseline:  Goal status: ongoing 03/21/22   4.  Pt to be I with prolapse relief positions, breathing mechanics and voiding mechanics to decrease strain at pelvic floor and prolapse without cues.  Baseline:  Goal status: Met 03/10/22   5.   Pt to demonstrate improved coordination of pelvic floor and breathing mechanics with 10# squats to decreased strain at pelvic floor and prolapse Baseline:  Goal status: ongoing 03/21/22     PLAN:   PT FREQUENCY: 1x/week   PT DURATION:  8 sessions   PLANNED INTERVENTIONS: Therapeutic exercises, Therapeutic activity, Neuromuscular re-education, Patient/Family education, Self Care, Joint mobilization, Dry Needling, Electrical stimulation, Cryotherapy, Moist heat, scar mobilization, Taping, Biofeedback, and Manual therapy    PLAN FOR NEXT SESSION:  Write up 10th visit note (1/2 to 4/1) ; renewal if needed, breathing mechanics, core/hip/pelvic floor strengthening and coordination, back strength to build up extension; check on urgency and urinary  leakage   Earlie Counts, PT 04/07/22 12:30 PM

## 2022-04-08 NOTE — Progress Notes (Signed)
GYNECOLOGY  VISIT   HPI: 77 y.o.   Married  Caucasian  female   [id number] with No LMP recorded. Patient is postmenopausal.   here for   prolapse. Husband is present for the visit today.  She has pelvic organ prolapse with a first degree cystocele and uterine prolapse.   She states that her pelvic floor therapy has not improved her symptoms.  She feels that the prolapse is worse and that her pelvic muscles are weak. Has not yet started use of vaginal lubricants.  She experiences increased prolapse if she strains to have a BM.  Using a squatty potty to help with BMs.  Accidental leakage of stool has improved.   Some urinary leakage when she gets up at night to void.  She can leak if she stands up to get out of the bed.   No leakage with laugh, cough, or sneeze.  This has not been a problem in the past.   She is considering a pessary for care.   GYNECOLOGIC HISTORY: No LMP recorded. Patient is postmenopausal. 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   Other female genital prolapse 10/29/2021   Immunization not carried out because of parent refusal 05/06/2021   Long-term use of Plaquenil 04/02/2021   Primary open angle glaucoma (POAG) of both eyes, severe stage 04/02/2021   White coat syndrome with diagnosis of hypertension 01/11/2021   Hyperlipidemia LDL goal <70 01/11/2021   Acquired thrombophilia 12/11/2020   BMI less than 19,adult 12/11/2020   Hypothyroidism 12/11/2020   History of CVA (cerebrovascular accident) 05/02/2020   Lupus    Allergic rhinitis     Past  Medical History:  Diagnosis Date   Allergy Spring 2010   Cataract 03/2019   Glaucoma Approximately 2005   Horseshoe retinal tear of left eye 04/02/2021   Hyperlipidemia LDL goal <70 01/11/2021   Hypertension 2022   Long-term use of Plaquenil 04/02/2021   Lupus    new rheum questioning original dx. continuing Plaquenil at lower dose for now.   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 bladder, acquired    Pseudophakia of both eyes 04/02/2021   Retinal detachment with multiple breaks, right eye 04/02/2021   Stroke 04/2020   Thyroid disease     Past Surgical History:  Procedure Laterality Date   ENDARTERECTOMY Right 05/02/2020   Procedure: RIGHT CAROTID ENDARTERECTOMY;  Surgeon: Larina EarthlyEarly, Todd F, MD;  [facility];  Service: Vascular;  Laterality: Right;   EYE SURGERY  01/2020   SCLERAL BUCKLE Right 2004   TUBAL LIGATION  Feb 1982    Current Outpatient Medications  Medication Sig Dispense Refill   ARMOUR THYROID 30 MG tablet Take 1 tablet (30 mg  total) by mouth daily. 90 tablet 4   aspirin EC 81 MG tablet Take 1 tablet (81 mg total) by mouth daily. Swallow whole. 30 tablet 11   cetirizine (ZYRTEC) 10 MG chewable tablet Chew 10 mg by mouth daily.     Cholecalciferol (VITAMIN D-3) 125 MCG (5000 UT) TABS Take by mouth daily.     hydroxychloroquine (PLAQUENIL) 200 MG tablet Take 200 mg by mouth daily.     losartan (COZAAR) 100 MG tablet Take 1 tablet (100 mg total) by mouth daily. 90 tablet 1   LUMIGAN 0.01 % SOLN Place 1 drop into the left eye at bedtime.     Misc Natural Products (AIRBORNE ELDERBERRY) CHEW Chew 1 tablet by mouth in the morning and at bedtime.     pantoprazole (PROTONIX) 40 MG tablet Take 1 tablet (40 mg total) by mouth daily. 90 tablet 3   Probiotic Product (UP4 PROBIOTICS WOMENS PO) Take 1 capsule by mouth daily.     Resveratrol 50 MG CAPS Take 50 mg by mouth daily.     rosuvastatin (CRESTOR) 40 MG tablet Take 1 tablet (40 mg  total) by mouth daily. 90 tablet 3   SIMBRINZA 1-0.2 % SUSP SHAKE LIQUID AND INSTILL 1 DROP IN LEFT EYE THREE TIMES DAILY     Ubiquinol 100 MG CAPS Take 100 mg by mouth.     verapamil (CALAN-SR) 180 MG CR tablet Take 1 tablet (180 mg total) by mouth at bedtime. 90 tablet 1   Current Facility-Administered Medications  Medication Dose Route Frequency Provider Last Rate Last Admin   0.9 %  sodium chloride infusion  500 mL Intravenous Once Tressia Danas, MD         ALLERGIES: Amlodipine, Augmentin [amoxicillin-pot clavulanate], Tramadol, and Flexeril [cyclobenzaprine]  Family History  Problem Relation Age of Onset   ALS Mother    Early death Mother    Dementia Father    Hypertension Father    Macular degeneration Maternal Aunt    Alcohol abuse Paternal Uncle    Alcohol abuse Paternal Uncle     Social History   Socioeconomic History   Marital status: Married    Spouse name: Wendy Jackson   Number of children: 2   Years of education: Not on file   Highest education level: Some college, no degree  Occupational History   Occupation: retired   Occupation: retired  Tobacco Use   Smoking status: Never    Passive exposure: Never   Smokeless tobacco: Never  Vaping Use   Vaping Use: Never used  Substance and Sexual Activity   Alcohol use: Never   Drug use: Never   Sexual activity: Not Currently    Birth control/protection: Surgical  Other Topics Concern   Not on file  Social History Narrative   Marital status/children/pets: Married.  2 children.   Education/employment: Retired.  College-educated.   Safety:      -smoke alarm in the home:Yes     - wears seatbelt: Yes     - Feels safe in their relationships: Yes      Social Determinants of Health   Financial Resource Strain: Low Risk  (06/12/2021)   Overall Financial Resource Strain (CARDIA)    Difficulty of Paying Living Expenses: Not hard at all  Food Insecurity: No Food Insecurity (06/12/2021)   Hunger Vital Sign    Worried  About Running Out of Food in the Last Year: Never true    Ran Out of Food in the Last Year: Never true  Transportation Needs: No Transportation Needs (06/12/2021)   PRAPARE - Administrator, Civil ServiceTransportation    Lack of Transportation (Medical): No    Lack of Transportation (Non-Medical): No  Physical Activity: Insufficiently Active (06/12/2021)   Exercise Vital Sign    Days of Exercise per Week: 4 days    Minutes of Exercise per Session: 30 min  Stress: No Stress Concern Present (06/12/2021)   [employer] - Occupational Stress Questionnaire    Feeling of Stress : Not at all  Social Connections: Moderately Isolated (06/12/2021)   Social Connection and Isolation Panel [NHANES]    Frequency of Communication with Friends and Family: Twice a week    Frequency of Social Gatherings with Friends and Family: Never    Attends Religious Services: 1 to 4 times per year    Active Member of [employer] Financial[employer]ubs or Organizations: No    Attends BankerClub or Organization Meetings: Never    Marital Status: Married  Catering managerntimate Partner Violence: Not At Risk (06/12/2021)   Humiliation, Afraid, Rape, and Kick questionnaire    Fear of Current or Ex-Partner: No    Emotionally Abused: No    Physically Abused: No    Sexually Abused: No    Review of Systems  All other systems reviewed and are negative.   PHYSICAL EXAMINATION:    BP 128/84 (BP Location: Left Arm, Patient Position: Sitting, Cuff Size: Normal)   Pulse 77   Ht 5\' 4"  (1.626 m)   Wt 90 lb (40.8 kg)   SpO2 99%   BMI 15.45 kg/m     General appearance: alert, cooperative and appears stated age   Pelvic: External genitalia:  no lesions              Urethra:  normal appearing urethra with no masses, tenderness or lesions              Bartholins and Skenes: normal                 Vagina: normal appearing vagina with normal color and discharge, no lesions.  Second degree bladder prolapse and first degree uterine prolapse and rectocele.               Cervix:  no lesions                Bimanual Exam:  Uterus:  normal size, contour, position, consistency, mobility, non-tender              Adnexa: no mass, fullness, tenderness              Rectal exam: yes.  Confirms.              Anus:  normal sphincter tone, no lesions  Chaperone was present for exam:  Warren Lacymily F, CMA  ASSESSMENT  Incomplete uterovaginal prolapse.  Progression of prolapse noted.  Urinary incontinence.  Fecal incontinence.  Hx stroke.   PLAN  I recommend she start using vaginal lubricant.  Discussion of pessary and written information given. We discussed surgical care as an alternative to a pessary.  I recommend trying Metamucil powder or capsule for improving bowel control/fecal incontinence.  Return for pessary fitting.   36 min total time was spent for this patient encounter, including preparation, face-to-face counseling with the patient, coordination of care, and documentation of the encounter.

## 2022-04-11 DIAGNOSIS — H33021 Retinal detachment with multiple breaks, right eye: Secondary | ICD-10-CM | POA: Diagnosis not present

## 2022-04-11 DIAGNOSIS — Z79899 Other long term (current) drug therapy: Secondary | ICD-10-CM | POA: Diagnosis not present

## 2022-04-11 DIAGNOSIS — H35371 Puckering of macula, right eye: Secondary | ICD-10-CM | POA: Diagnosis not present

## 2022-04-11 DIAGNOSIS — H33312 Horseshoe tear of retina without detachment, left eye: Secondary | ICD-10-CM | POA: Diagnosis not present

## 2022-04-14 ENCOUNTER — Ambulatory Visit: Payer: Medicare Other | Attending: Obstetrics and Gynecology | Admitting: Physical Therapy

## 2022-04-14 ENCOUNTER — Encounter: Payer: Self-pay | Admitting: Physical Therapy

## 2022-04-14 DIAGNOSIS — R293 Abnormal posture: Secondary | ICD-10-CM

## 2022-04-14 DIAGNOSIS — R279 Unspecified lack of coordination: Secondary | ICD-10-CM

## 2022-04-14 DIAGNOSIS — M6281 Muscle weakness (generalized): Secondary | ICD-10-CM

## 2022-04-14 NOTE — Therapy (Signed)
OUTPATIENT PHYSICAL THERAPY TREATMENT NOTE   Patient Name: Wendy Jackson MRN: [id number] DOB:[birth year], 77 y.o., female Today's Date: 04/14/2022  PCP:  [name], DO  REFERRING PROVIDER: Nunzio Cobbs, MD  Progress Note Reporting Period 01/14/22 to 04/14/22  See note below for Objective Data and Assessment of Progress/Goals.     END OF SESSION:   PT End of Session - 04/14/22 1135     Visit Number 10    Date for PT Re-Evaluation 04/15/22    Authorization Type [employer] MCR    Authorization - Visit Number 10    Authorization - Number of Visits 10    PT Start Time [id number]    PT Stop Time 1215    PT Time Calculation (min) 40 min    Activity Tolerance Patient tolerated treatment well    Behavior During Therapy [facility] for tasks assessed/performed             Past Medical History:  Diagnosis Date   Allergy Spring 2010   Cataract 03/2019   Glaucoma Approximately 2005   [place] retinal tear of left eye 04/02/2021   Hyperlipidemia LDL goal <70 01/11/2021   Hypertension 2022   Long-term use of Plaquenil 04/02/2021   Lupus    new rheum questioning original dx. continuing Plaquenil at lower dose for now.   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 bladder, acquired    Pseudophakia of both eyes 04/02/2021   Retinal detachment with multiple breaks, right eye 04/02/2021   Stroke 04/2020   Thyroid disease    Past Surgical History:  Procedure Laterality Date   ENDARTERECTOMY Right 05/02/2020   Procedure: RIGHT CAROTID ENDARTERECTOMY;  Surgeon: Rosetta Posner, MD;  Location[facility] OR;  Service: Vascular;  Laterality: Right;   EYE SURGERY  01/2020   SCLERAL BUCKLE Right 2004   TUBAL LIGATION  Feb 1982   Patient Active Problem List   Diagnosis Date Noted   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   Other female genital prolapse 10/29/2021   Immunization not carried out because of parent refusal 05/06/2021   Long-term use of Plaquenil 04/02/2021    Primary open angle glaucoma (POAG) of both eyes, severe stage 04/02/2021   White coat syndrome with diagnosis of hypertension 01/11/2021   Hyperlipidemia LDL goal <70 01/11/2021   Acquired thrombophilia 12/11/2020   BMI less than 19,adult 12/11/2020   Hypothyroidism 12/11/2020   History of CVA (cerebrovascular accident) 05/02/2020   Lupus    Allergic rhinitis    REFERRING DIAG: N81.2 (ICD-10-CM) - Incomplete uterovaginal prolapse R35.0 (ICD-10-CM) - Urinary frequency   THERAPY DIAG:  Muscle weakness (generalized)   Abnormal posture   Unspecified lack of coordination   Rationale for Evaluation and Treatment: Rehabilitation   ONSET DATE: July 2023   SUBJECTIVE:  SUBJECTIVE STATEMENT: My prolapse is worse. I am able to delay the urge to void. Last week I have not had much of the residue in the anal region.    PAIN:  Are you having pain? No     PRECAUTIONS: None   WEIGHT BEARING RESTRICTIONS: No   FALLS:  Has patient fallen in last 6 months? No   LIVING ENVIRONMENT: Lives with: lives with their family Lives in: House/apartment     OCCUPATION: retired    PLOF: Independent   PATIENT GOALS: to have decreased prolapse symptoms   PERTINENT HISTORY:  Stroke, thyroid disease, ENDARTERECTOMY, lupus Sexual abuse: No   BOWEL MOVEMENT: Pain with bowel movement: No Type of bowel movement:Type (Bristol Stool Scale) 4, Frequency usually 1-2x per day has slowed a little in recently to once per day, and Strain No (not usually sometimes) Fully empty rectum: No not always Leakage: none this past week Pads: No Fiber supplement: No   URINATION: Pain with urination: No Fully empty bladder: Yes:   Stream: Strong Urgency: Yes: mostly at night Frequency: in am may go quicker than every 2 hours,  night 1-2x  Leakage:  only very occasionally with nighttime urgency  Pads: No   INTERCOURSE: Pain with intercourse:  not active   PREGNANCY: Vaginal deliveries 2     PROLAPSE: Cystocele per pt, pressure felt vaginally, bulge felt there as well. Straining with voiding if she attempts she will feel it the most and sometimes with lifting.      OBJECTIVE:    DIAGNOSTIC FINDINGS:      COGNITION: Overall cognitive status: Within functional limits for tasks assessed                          SENSATION: Light touch: Appears intact Proprioception: Appears intact   MUSCLE LENGTH: Bil hamstrings and adductors limited by 25%     POSTURE: rounded shoulders, forward head, and posterior pelvic tilt       LUMBARAROM/PROM:   A/PROM A/PROM  eval  Flexion Limited by 25%  Extension WFL  Right lateral flexion Limited by 25%  Left lateral flexion Limited by 25%  Right rotation Limited by 25%  Left rotation Limited by 25%   (Blank rows = not tested)   LOWER EXTREMITY ROM:   WFL   LOWER EXTREMITY MMT:  04/14/22 Bil hips grossly 4+/5, knees and ankles 5/5    PALPATION:   General  no TTP but did have fascial restrictions throughout abdomen                  External Perineal Exam dryness noted at vulva, prolapse a little past the introitus in supine and red structure out of the urethra.                             Internal Pelvic Floor no pain, WFL   Patient confirms identification and approves PT to assess internal pelvic floor and treatment Yes   PELVIC MMT:   MMT eval 02/10/22 04/07/22  Vaginal 2/5, 3s, 3 reps 3/5 holding for 6 sec 3/5 for 8 sec          TONE: WFL   PROLAPSE: In hooklying possible grade 2 anterior wall laxity noted with cough   TODAY'S TREATMENT:   04/14/22 Exercises: Stretches/mobility: Lay prone to elongate the anterior trunk and reduce thoracic kyphosis Strengthening: Mini squat with 10# keeping close to her  trunk, flexing at her hips not at the  waist Supine legs up with ball squeeze to contract the pelvic floor.  Supine knees elevated with hip abduction and pelvic floor contraction Standing bilateral shoulder extension with red band Standing bilateral shoulder horizontal Leg press 50# 3x10  Side step on 6 inch step 15x each side  04/07/22 Manual: Internal pelvic floor techniques: No emotional/communication barriers or cognitive limitation. Patient is motivated to learn. Patient understands and agrees with treatment goals and plan. PT explains patient will be examined in standing, sitting, and lying down to see how their muscles and joints work. When they are ready, they will be asked to remove their underwear so PT can examine their perineum. The patient is also given the option of providing their own chaperone as one is not provided in our facility. The patient also has the right and is explained the right to defer or refuse any part of the evaluation or treatment including the internal exam. With the patient's consent, PT will use one gloved finger to gently assess the muscles of the pelvic floor, seeing how well it contracts and relaxes and if there is muscle symmetry. After, the patient will get dressed and PT and patient will discuss exam findings and plan of care. PT and patient discuss plan of care, schedule, attendance policy and HEP activities.  Going through the vagina and working on the sides of the introitus to improve pelvic floor contraction. Started with 2/5 then ended with 3/5 holding for 8 seconds and hug of therapist finger improved  Educated patient on using vaginal moisturizers to work on vaginal health and reduce the dryness.  Exercises: Strengthening: Nustep level 5 for 7 minutes while assessing patient Pelvic floor contraction in supine holding for 6 sec 10x Educated patient on what a pessary is and how it works and can assist with pelvic floor contracting due to the prolapse not in the way of the muscle contraction.     03/31/22 Exercises: Strengthening: (all exercises with pelvic floor contraction) Nustep level 5 for 7 minutes while assessing patient Side step on 6 inch step 15x each side Standing bilateral shoulder extension with forward leg having the red band around to add in hip adductors 15x each side Standing bilateral shoulder horizontal abduction with red band and legs off set 2x 10 with forward leg having the red band around to add in hip adductors Sit to stand with green band around the knees on 20 inch block Leg press 50# 3x10  Contracting the anus for 10 sec 10x Bridge with loop around knees 15x        PATIENT EDUCATION:  03/31/22 Education details: Ardeth Perfect, educated on vaginal moisturizers, how to manage prolapse Person educated: Patient Education method: Explanation, Demonstration, Tactile cues, Verbal cues, and Handouts Education comprehension: verbalized understanding and returned demonstration   HOME EXERCISE PROGRAM: 03/31/22 Access Code: [email or web address] URL: [email or web address] Date: 03/31/2022 Prepared by: Earlie Counts   Program Notes walk more in the earlier day than later in the day.    Exercises - Supine Diaphragmatic Breathing  - 1 x daily - 7 x weekly - 3 sets - 10 reps - Supine Pelvic Floor Contraction  - 1 x daily - 7 x weekly - 3 sets - 10 reps - Diaphragmatic Breathing with Hips Elevated (for Pelvic Organ Prolapse)  - 2 x daily - 7 x weekly - 1 sets - 10 reps - Pelvic Floor Muscle Contraction and Adductor Squeeze With Hips Elevated (for Pelvic Organ Prolapse)  -  2 x daily - 7 x weekly - 1 sets - 10 reps - 5 sec hold - Pelvic Floor Muscle Contraction With Resisted Abduction With Hips Elevated (for Pelvic Organ Prolapse)  - 2 x daily - 7 x weekly - 1 sets - 10 reps - Supine Bridge with Pelvic Floor Contraction  - 1 x daily - 7 x weekly - 1 sets - 10 reps - Seated Exhale with Pelvic Floor Contraction and Hand to Mouth  - 1 x daily - 7 x weekly - 1 sets - 10  reps - Sit to Stand with Pelvic Floor Contraction  - 1 x daily - 7 x weekly - 1 sets - 10 reps - Lateral Step Up with Unilateral Counter Support  - 1 x daily - 3 x weekly - 2 sets - 10 reps - Standing Shoulder Extension with Resistance  - 1 x daily - 3 x weekly - 1 sets - 10 reps - Standing Shoulder Horizontal Abduction with Anchored Resistance  - 1 x daily - 3 x weekly - 1 sets - 10 reps     ASSESSMENT:   CLINICAL IMPRESSION: Patient is a 77 y.o. female  who was seen today for physical therapy  treatment for prolapse of anterior vaginal wall, urinary frequency.  Patient has improved urgency. Patient bilateral hip strength increased to 4+/5. Patient is standing taller with less thoracic kyphosis. Patient is not leaking stool or urine.  Pelvic floor strength is 3/5 for 8 seconds. Her prolapse is past the introitus in supine. She was educated on pessaries and how they can help with bladder. She was educated on using the vaginal moisturizers to assist with tissue health and strength. Patient is able to squat and lift 10# with good posture and no strain on the pelvic floor. Patient reports reduction of urgency with urine. Patient is independent with her HEP.      OBJECTIVE IMPAIRMENTS: decreased coordination, decreased endurance, decreased mobility, difficulty walking, decreased strength, increased fascial restrictions, impaired flexibility, improper body mechanics, and postural dysfunction.    ACTIVITY LIMITATIONS: lifting, standing, and continence   PARTICIPATION LIMITATIONS: community activity   PERSONAL FACTORS: Fitness and Time since onset of injury/illness/exacerbation are also affecting patient's functional outcome.    REHAB POTENTIAL: Good   CLINICAL DECISION MAKING: Stable/uncomplicated   EVALUATION COMPLEXITY: Low     GOALS: Goals reviewed with patient? Yes   SHORT TERM GOALS: Target date: 02/11/22   Pt to be I with HEP.  Baseline: Goal status: Met 03/24/22   2.  Pt will  report her BMs are complete at least 50% of the time due to improved bowel habits and evacuation techniques.  Baseline: some stool leakage since she had her colonoscopy due to stool being loose.  Goal status: met 03/24/22   3.  Pt to demonstrate at least 3/5 pelvic floor strength for improved pelvic stability and decreased strain at pelvic floor/ decrease leakage.  Baseline:  Goal status: Met 04/14/22  4.  Pt will have 75% less urgency due to bladder retraining and strengthening  Baseline: improved by 70% Goal status: met 04/07/22    LONG TERM GOALS: Target date: 04/15/22   Pt to be I with advanced HEP.  Baseline:  Goal status: Met 04/14/22   2.  Pt to demonstrate at least 5/5 bil hip strength for improved pelvic stability and functional squats without leakage.  Baseline:  Goal status: ongoing 03/21/22   3.  Pt to demonstrate at least 4/5 pelvic floor strength for  improved pelvic stability and decreased strain at pelvic floor/ decrease leakage.  Baseline:  Goal status: Partially met 04/14/22   4.  Pt to be I with prolapse relief positions, breathing mechanics and voiding mechanics to decrease strain at pelvic floor and prolapse without cues.  Baseline:  Goal status: Met 03/10/22   5.   Pt to demonstrate improved coordination of pelvic floor and breathing mechanics with 10# squats to decreased strain at pelvic floor and prolapse Baseline:  Goal status: Met 04/14/22     PLAN:Discharge to HEP   Earlie Counts, PT 04/14/22 11:39 AM  PHYSICAL THERAPY DISCHARGE SUMMARY  Visits from Start of Care: 10  Current functional level related to goals / functional outcomes: See above. She continues to have the prolapse but is stronger.    Remaining deficits: See above.    Education / Equipment: HEP   Patient agrees to discharge. Patient goals were met. Patient is being discharged due to meeting the stated rehab goals.Thank you for the referral. Earlie Counts, PT 04/14/22 12:28 PM

## 2022-04-16 ENCOUNTER — Other Ambulatory Visit: Payer: Self-pay | Admitting: *Deleted

## 2022-04-16 DIAGNOSIS — I6523 Occlusion and stenosis of bilateral carotid arteries: Secondary | ICD-10-CM

## 2022-04-17 DIAGNOSIS — K080 Exfoliation of teeth due to systemic causes: Secondary | ICD-10-CM | POA: Diagnosis not present

## 2022-04-22 ENCOUNTER — Encounter: Payer: Self-pay | Admitting: Obstetrics and Gynecology

## 2022-04-22 ENCOUNTER — Ambulatory Visit (INDEPENDENT_AMBULATORY_CARE_PROVIDER_SITE_OTHER): Payer: Medicare Other | Admitting: Obstetrics and Gynecology

## 2022-04-22 VITALS — BP 128/84 | HR 77 | Ht 64.0 in | Wt 90.0 lb

## 2022-04-22 DIAGNOSIS — N812 Incomplete uterovaginal prolapse: Secondary | ICD-10-CM

## 2022-04-22 DIAGNOSIS — R159 Full incontinence of feces: Secondary | ICD-10-CM | POA: Diagnosis not present

## 2022-04-22 DIAGNOSIS — R32 Unspecified urinary incontinence: Secondary | ICD-10-CM | POA: Diagnosis not present

## 2022-04-22 NOTE — Patient Instructions (Signed)
How to Use a Vaginal Pessary  A vaginal pessary is a removable device that is placed into your vagina to support pelvic organs that droop. These organs include your uterus, bladder, and rectum. When your pelvic organs drop down into your vagina, it causes a condition called pelvic organ prolapse (POP). A pessary may be an alternative to surgery for women with POP. It may help women who leak urine when they strain or exercise (stress incontinence). This is a symptom of POP. A vaginal pessary may also be a temporary treatment for stress incontinence during pregnancy. There are several types of pessaries. All types are usually made of silicone. You can insert and remove some on your own. Other types must be inserted and removed by your health care provider at office visits. The reason you are using a pessary and the severity of your condition will determine which one is best for you. It is also important to find the right size. A pessary that is too small may fall out. A pessary that is too large may cause pain or discomfort. Your health care provider will do a physical exam to find the correct size and fit for your pessary. It may take several appointments to find the best fit for you. If you can be fit with the type of pessary that you can insert, remove, and clean yourself, your health care provider will teach you how to use your pessary at home. You may have checkups every few months. If you have the type of pessary that needs to be inserted and removed by your health care provider, you will have appointments every few months to have the pessary removed, cleaned, and replaced. What are the risks? When properly fitted and cared for, risks of using a vaginal pessary can be small. However, there can be problems that may include: Vaginal discharge. Vaginal bleeding. A bad smell coming from your vagina. Scraping of the skin inside your vagina. How to use your pessary Follow your health care provider's  instructions for using a pessary. These instructions may vary, depending on the type of pessary you have. To insert a pessary: Wash your hands with soap and water for at least 20 seconds. Squeeze or fold the pessary in half and lubricate the tip with a water-based lubricant. Insert the pessary into your vagina. It will unfold and provide support. To remove the pessary, gently tug it out of your vagina. You can remove the pessary every night or after several days. You can also remove it to have sex. How to care for your pessary If you have a pessary that you can remove: Clean your pessary with soap and water. Rinse well. Dry it completely before inserting it back into your vagina. Follow these instructions at home: Take over-the-counter and prescription medicines only as told by your health care provider. Your health care provider may prescribe an estrogen cream to moisten your vagina. Keep all follow-up visits. This is important. Contact a health care provider if: You feel any pain or discomfort when your pessary is in place. You continue to have stress incontinence. You have trouble keeping your pessary from falling out. You have an unusual vaginal discharge that is blood-tinged or smells bad. Summary A vaginal pessary is a removable device that is placed into your vagina to support pelvic organs that droop. This condition is called pelvic organ prolapse (POP). There are several types of pessaries. Some you can insert and remove on your own. Others must be inserted and   removed by your health care provider. The best type for you depends on the reason you are using a pessary and the severity of your condition. It is also important to find the right size. If you can use the type that you insert and remove on your own, your health care provider will teach you how to use it and schedule checkups every few months. If you have the type that needs to be inserted and removed by your health care  provider, you will have regular appointments to have your pessary removed, cleaned, and replaced. This information is not intended to replace advice given to you by your health care provider. Make sure you discuss any questions you have with your health care provider. Document Revised: 06/30/2019 Document Reviewed: 06/30/2019 [employer]vier Patient Education  2023 [employer]  

## 2022-04-24 ENCOUNTER — Ambulatory Visit (HOSPITAL_COMMUNITY)
Admission: RE | Admit: 2022-04-24 | Discharge: 2022-04-24 | Disposition: A | Discharge status: Home / Self Care | Payer: Medicare Other | Source: Ambulatory Visit | Attending: Vascular Surgery | Admitting: Vascular Surgery

## 2022-04-24 ENCOUNTER — Ambulatory Visit: Payer: Medicare Other | Admitting: Physician Assistant

## 2022-04-24 ENCOUNTER — Encounter: Payer: Self-pay | Admitting: Physician Assistant

## 2022-04-24 VITALS — BP 179/51 | HR 63 | Temp 98.2°F | Wt 89.5 lb

## 2022-04-24 DIAGNOSIS — I6523 Occlusion and stenosis of bilateral carotid arteries: Secondary | ICD-10-CM | POA: Diagnosis not present

## 2022-04-24 DIAGNOSIS — I63231 Cerebral infarction due to unspecified occlusion or stenosis of right carotid arteries: Secondary | ICD-10-CM | POA: Diagnosis not present

## 2022-04-24 NOTE — Progress Notes (Signed)
Office Note     CC:  follow up Requesting Provider:  Natalia Leatherwood, DO  HPI: Wendy Jackson is a 77 y.o. ([birth year]) female who presents for surveillance of carotid artery stenosis.  She underwent right carotid endarterectomy in April 2022 by Dr. Arbie Cookey.  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  She had left hand weakness when she presented to the hospital however this resolved and is now back to normal.  She continues to take aspirin and statin daily.  She follows regularly with a PCP for management of chronic medical conditions including hyperlipidemia and hypertension.  She denies tobacco use.   Past Medical History:  Diagnosis Date   Allergy Spring 2010   Cataract 03/2019   Glaucoma Approximately 2005   Horseshoe retinal tear of left eye 04/02/2021   Hyperlipidemia LDL goal <70 01/11/2021   Hypertension 2022   Long-term use of Plaquenil 04/02/2021   Lupus    new rheum questioning original dx. continuing Plaquenil at lower dose for now.   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 bladder, acquired    Pseudophakia of both eyes 04/02/2021   Retinal detachment with multiple breaks, right eye 04/02/2021   Stroke 04/2020   Thyroid disease     Past Surgical History:  Procedure Laterality Date   ENDARTERECTOMY Right 05/02/2020   Procedure: RIGHT CAROTID ENDARTERECTOMY;  Surgeon: Larina Earthly, MD;  Location: [facility] OR;  Service: Vascular;  Laterality: Right;   EYE SURGERY  01/2020   SCLERAL BUCKLE Right 2004   TUBAL LIGATION  Feb 1982    Social History   Socioeconomic History   Marital status: Married    Spouse name: Peyton Najjar   Number of children: 2   Years of education: Not on file   Highest education level: Some college, no degree  Occupational History   Occupation: retired   Occupation: retired  Tobacco Use   Smoking status: Never    Passive exposure: Never   Smokeless tobacco: Never  Vaping Use    Vaping Use: Never used  Substance and Sexual Activity   Alcohol use: Never   Drug use: Never   Sexual activity: Not Currently    Birth control/protection: Surgical  Other Topics Concern   Not on file  Social History Narrative   Marital status/children/pets: Married.  2 children.   Education/employment: Retired.  College-educated.   Safety:      -smoke alarm in the home:Yes     - wears seatbelt: Yes     - Feels safe in their relationships: Yes      Social Determinants of Health   Financial Resource Strain: Low Risk  (06/12/2021)   Overall Financial Resource Strain (CARDIA)    Difficulty of Paying Living Expenses: Not hard at all  Food Insecurity: No Food Insecurity (06/12/2021)   Hunger Vital Sign    Worried About Running Out of Food in the Last Year: Never true    Ran Out of Food in the Last Year: Never true  Transportation Needs: No Transportation Needs (06/12/2021)   PRAPARE - Administrator, Civil Service (Medical): No    Lack of Transportation (Non-Medical): No  Physical Activity: Insufficiently Active (06/12/2021)   Exercise Vital Sign    Days of Exercise per Week: 4 days    Minutes of Exercise per Session: 30 min  Stress: No Stress Concern Present (06/12/2021)   [employer] of Occupational  Health - Occupational Stress Questionnaire    Feeling of Stress : Not at all  Social Connections: Moderately Isolated (06/12/2021)   Social Connection and Isolation Panel [NHANES]    Frequency of Communication with Friends and Family: Twice a week    Frequency of Social Gatherings with Friends and Family: Never    Attends Religious Services: 1 to 4 times per year    Active Member of [employer] or Organizations: No    Attends Banker Meetings: Never    Marital Status: Married  Catering manager Violence: Not At Risk (06/12/2021)   Humiliation, Afraid, Rape, and Kick questionnaire    Fear of Current or Ex-Partner: No    Emotionally Abused: No    Physically  Abused: No    Sexually Abused: No    Family History  Problem Relation Age of Onset   ALS Mother    Early death Mother    Dementia Father    Hypertension Father    Macular degeneration Maternal Aunt    Alcohol abuse Paternal Uncle    Alcohol abuse Paternal Uncle     Current Outpatient Medications  Medication Sig Dispense Refill   ARMOUR THYROID 30 MG tablet Take 1 tablet (30 mg total) by mouth daily. 90 tablet 4   aspirin EC 81 MG tablet Take 1 tablet (81 mg total) by mouth daily. Swallow whole. 30 tablet 11   cetirizine (ZYRTEC) 10 MG chewable tablet Chew 10 mg by mouth daily.     Cholecalciferol (VITAMIN D-3) 125 MCG (5000 UT) TABS Take by mouth daily.     hydroxychloroquine (PLAQUENIL) 200 MG tablet Take 200 mg by mouth daily.     losartan (COZAAR) 100 MG tablet Take 1 tablet (100 mg total) by mouth daily. 90 tablet 1   LUMIGAN 0.01 % SOLN Place 1 drop into the left eye at bedtime.     Misc Natural Products (AIRBORNE ELDERBERRY) CHEW Chew 1 tablet by mouth in the morning and at bedtime.     pantoprazole (PROTONIX) 40 MG tablet Take 1 tablet (40 mg total) by mouth daily. 90 tablet 3   Probiotic Product (UP4 PROBIOTICS WOMENS PO) Take 1 capsule by mouth daily.     Resveratrol 50 MG CAPS Take 50 mg by mouth daily.     rosuvastatin (CRESTOR) 40 MG tablet Take 1 tablet (40 mg total) by mouth daily. 90 tablet 3   SIMBRINZA 1-0.2 % SUSP SHAKE LIQUID AND INSTILL 1 DROP IN LEFT EYE THREE TIMES DAILY     Ubiquinol 100 MG CAPS Take 100 mg by mouth.     verapamil (CALAN-SR) 180 MG CR tablet Take 1 tablet (180 mg total) by mouth at bedtime. 90 tablet 1   Current Facility-Administered Medications  Medication Dose Route Frequency Provider Last Rate Last Admin   0.9 %  sodium chloride infusion  500 mL Intravenous Once Tressia Danas, MD        Allergies  Allergen Reactions   Amlodipine Other (See Comments)    Bilateral lower extremity edema   Augmentin [Amoxicillin-Pot Clavulanate]  Other (See Comments)    unk   Tramadol     Nausea    Flexeril [Cyclobenzaprine] Palpitations     REVIEW OF SYSTEMS:   [X]  denotes positive finding, [ ]  denotes negative finding Cardiac  Comments:  Chest pain or chest pressure:    Shortness of breath upon exertion:    Short of breath when lying flat:    Irregular heart rhythm:  Vascular    Pain in calf, thigh, or hip brought on by ambulation:    Pain in feet at night that wakes you up from your sleep:     Blood clot in your veins:    Leg swelling:         Pulmonary    Oxygen at home:    Productive cough:     Wheezing:         Neurologic    Sudden weakness in arms or legs:     Sudden numbness in arms or legs:     Sudden onset of difficulty speaking or slurred speech:    Temporary loss of vision in one eye:     Problems with dizziness:         Gastrointestinal    Blood in stool:     Vomited blood:         Genitourinary    Burning when urinating:     Blood in urine:        Psychiatric    Major depression:         Hematologic    Bleeding problems:    Problems with blood clotting too easily:        Skin    Rashes or ulcers:        Constitutional    Fever or chills:      PHYSICAL EXAMINATION:  There were no vitals filed for this visit.  General:  WDWN in NAD; vital signs documented above Gait: Not observed HENT: WNL, normocephalic Pulmonary: normal non-labored breathing , without Rales, rhonchi,  wheezing Cardiac: regular HR Abdomen: soft, NT, no masses Skin: without rashes Vascular Exam/Pulses: Symmetrical radial pulses Musculoskeletal: no muscle wasting or atrophy  Neurologic: A&O X 3; CN grossly intact Psychiatric:  The pt has Normal affect.   Non-Invasive Vascular Imaging:   Bilateral internal carotid arteries 1 to 39% stenosis    ASSESSMENT/PLAN:: 77 y.o. female here for follow up for surveillance of carotid artery stenosis  -Subjectively, patient has not had any neurological  events since last office visit.  Left hand weakness resolved soon after her CVA.  Carotid duplex is unchanged demonstrating 1 to 39% stenosis of bilateral internal carotid arteries.  She will continue her aspirin and statin daily.  She will continue to follow regularly with her PCP for management of chronic medical conditions including hyperlipidemia and hypertension.  We will recheck carotid duplex in 1 year per protocol.   Emilie RutterMatthew Alizandra Loh, PA-C [facility] [phone]  Clinic MD:   Edilia Boickson

## 2022-04-28 NOTE — Progress Notes (Unsigned)
GYNECOLOGY  VISIT   HPI: 77 y.o.   Married  Caucasian  female   [id number] with No LMP recorded. Patient is postmenopausal.   here for   pessary fitting.  She has second degree bladder prolapse with a first degree cystocele and rectocele.  No stress incontinence symptoms but can leak if she stands up to get out of the bed.   GYNECOLOGIC HISTORY: No LMP recorded. Patient is postmenopausal. 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   Other female genital prolapse 10/29/2021   Immunization not carried out because of parent refusal 05/06/2021   Long-term use of Plaquenil 04/02/2021   Primary open angle glaucoma (POAG) of both eyes, severe stage 04/02/2021   White coat syndrome with diagnosis of hypertension 01/11/2021   Hyperlipidemia LDL goal <70 01/11/2021   Acquired thrombophilia (HCC) 12/11/2020   BMI less than 19,adult 12/11/2020   Hypothyroidism 12/11/2020   History of CVA (cerebrovascular accident) 05/02/2020   Lupus (HCC)    Allergic rhinitis     Past Medical History:  Diagnosis Date   Allergy Spring 2010   Cataract 03/2019   Glaucoma Approximately 2005   Horseshoe retinal tear of left eye 04/02/2021   Hyperlipidemia LDL goal <70 01/11/2021   Hypertension 2022   Long-term use of Plaquenil 04/02/2021   Lupus (HCC)    new rheum questioning original dx. continuing Plaquenil at lower dose for now.   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 bladder, acquired    Pseudophakia of both eyes 04/02/2021   Retinal  detachment with multiple breaks, right eye 04/02/2021   Stroke (HCC) 04/2020   Thyroid disease     Past Surgical History:  Procedure Laterality Date   ENDARTERECTOMY Right 05/02/2020   Procedure: RIGHT CAROTID ENDARTERECTOMY;  Surgeon: Larina Earthly, MD;  Location: [facility] OR;  Service: Vascular;  Laterality: Right;   EYE SURGERY  01/2020   SCLERAL BUCKLE Right 2004   TUBAL LIGATION  Feb 1982    Current Outpatient Medications  Medication Sig Dispense Refill   ARMOUR THYROID 30 MG tablet Take 1 tablet (30 mg total) by mouth daily. 90 tablet 4   aspirin EC 81 MG tablet Take 1 tablet (81 mg total) by mouth daily. Swallow whole. 30 tablet 11   cetirizine (ZYRTEC) 10 MG chewable tablet Chew 10 mg by mouth daily.     Cholecalciferol (VITAMIN D-3) 125 MCG (5000 UT) TABS Take by mouth daily.     hydroxychloroquine (PLAQUENIL) 200 MG tablet Take 200 mg by mouth daily.     losartan (COZAAR) 100 MG tablet Take 1 tablet (100 mg total) by mouth daily. 90 tablet 1   LUMIGAN 0.01 % SOLN  Place 1 drop into the left eye at bedtime.     Misc Natural Products (AIRBORNE ELDERBERRY) CHEW Chew 1 tablet by mouth in the morning and at bedtime.     pantoprazole (PROTONIX) 40 MG tablet Take 1 tablet (40 mg total) by mouth daily. 90 tablet 3   Probiotic Product (UP4 PROBIOTICS WOMENS PO) Take 1 capsule by mouth daily.     Resveratrol 50 MG CAPS Take 50 mg by mouth daily.     rosuvastatin (CRESTOR) 40 MG tablet Take 1 tablet (40 mg total) by mouth daily. 90 tablet 3   SIMBRINZA 1-0.2 % SUSP SHAKE LIQUID AND INSTILL 1 DROP IN LEFT EYE THREE TIMES DAILY     Ubiquinol 100 MG CAPS Take 100 mg by mouth.     verapamil (CALAN-SR) 240 MG CR tablet Take 1 tablet (240 mg total) by mouth at bedtime. 90 tablet 1   No current facility-administered medications for this visit.     ALLERGIES: Amlodipine, Augmentin [amoxicillin-pot clavulanate], Tramadol, and Flexeril [cyclobenzaprine]  Family History  Problem Relation Age of  Onset   ALS Mother    Early death Mother    Dementia Father    Hypertension Father    Macular degeneration Maternal Aunt    Alcohol abuse Paternal Uncle    Alcohol abuse Paternal Uncle     Social History   Socioeconomic History   Marital status: Married    Spouse name: Peyton Najjar   Number of children: 2   Years of education: Not on file   Highest education level: Some college, no degree  Occupational History   Occupation: retired   Occupation: retired  Tobacco Use   Smoking status: Never    Passive exposure: Never   Smokeless tobacco: Never  Vaping Use   Vaping Use: Never used  Substance and Sexual Activity   Alcohol use: Never   Drug use: Never   Sexual activity: Not Currently    Birth control/protection: Surgical  Other Topics Concern   Not on file  Social History Narrative   Marital status/children/pets: Married.  2 children.   Education/employment: Retired.  College-educated.   Safety:      -smoke alarm in the home:Yes     - wears seatbelt: Yes     - Feels safe in their relationships: Yes      Social Determinants of Health   Financial Resource Strain: Low Risk  (06/12/2021)   Overall Financial Resource Strain (CARDIA)    Difficulty of Paying Living Expenses: Not hard at all  Food Insecurity: No Food Insecurity (06/12/2021)   Hunger Vital Sign    Worried About Running Out of Food in the Last Year: Never true    Ran Out of Food in the Last Year: Never true  Transportation Needs: No Transportation Needs (06/12/2021)   PRAPARE - Administrator, Civil Service (Medical): No    Lack of Transportation (Non-Medical): No  Physical Activity: Insufficiently Active (06/12/2021)   Exercise Vital Sign    Days of Exercise per Week: 4 days    Minutes of Exercise per Session: 30 min  Stress: No Stress Concern Present (06/12/2021)   [employer] of Occupational Health - Occupational Stress Questionnaire    Feeling of Stress : Not at all  Social Connections:  Moderately Isolated (06/12/2021)   Social Connection and Isolation Panel [NHANES]    Frequency of Communication with Friends and Family: Twice a week    Frequency of Social Gatherings with Friends and Family: Never  Attends Religious Services: 1 to 4 times per year    Active Member of Clubs or Organizations: No    Attends Banker Meetings: Never    Marital Status: Married  Catering manager Violence: Not At Risk (06/12/2021)   Humiliation, Afraid, Rape, and Kick questionnaire    Fear of Current or Ex-Partner: No    Emotionally Abused: No    Physically Abused: No    Sexually Abused: No    Review of Systems  All other systems reviewed and are negative.   PHYSICAL EXAMINATION:    BP 132/68 (BP Location: Left Arm, Patient Position: Sitting, Cuff Size: Normal)   Pulse 81   Ht 5\' 4"  (1.626 m)   Wt 89 lb (40.4 kg)   SpO2 99%   BMI 15.28 kg/m     General appearance: alert, cooperative and appears stated age   Pelvic: External genitalia:  no lesions              Urethra:  normal appearing urethra with no masses, tenderness or lesions              Bartholins and Skenes: normal                 Vagina: normal appearing vagina with normal color and discharge, no lesions              Cervix: no lesions                Bimanual Exam:  Uterus:  normal size, contour, position, consistency, mobility, non-tender              Adnexa: no mass, fullness, tenderness      #3 ring with support comfortable.  Able to do maneuvers, walk, and using the toilet and maintained the demonstration pessary comfortably.  Pessary was removed.  #2 ring with support not comfortable and was removed.  Chaperone was present for exam:  Warren Lacy, CMA  ASSESSMENT  Incomplete uterovaginal prolapse.   PLAN  Premier pessary ring with support Lot number [id number], exp 06/13/31.  Pessary instructions given. FU in 2 weeks.  Will teach patient to place and remove at her follow up visit.    An After Visit  Summary was printed and given to the patient.

## 2022-05-06 ENCOUNTER — Encounter: Payer: Self-pay | Admitting: Family Medicine

## 2022-05-06 ENCOUNTER — Ambulatory Visit (INDEPENDENT_AMBULATORY_CARE_PROVIDER_SITE_OTHER): Payer: Medicare Other | Admitting: Family Medicine

## 2022-05-06 VITALS — BP 160/62 | HR 66 | Temp 98.2°F | Ht 64.0 in | Wt 89.4 lb

## 2022-05-06 DIAGNOSIS — E039 Hypothyroidism, unspecified: Secondary | ICD-10-CM | POA: Diagnosis not present

## 2022-05-06 DIAGNOSIS — I10 Essential (primary) hypertension: Secondary | ICD-10-CM | POA: Diagnosis not present

## 2022-05-06 DIAGNOSIS — L989 Disorder of the skin and subcutaneous tissue, unspecified: Secondary | ICD-10-CM | POA: Diagnosis not present

## 2022-05-06 DIAGNOSIS — E785 Hyperlipidemia, unspecified: Secondary | ICD-10-CM | POA: Diagnosis not present

## 2022-05-06 MED ORDER — VERAPAMIL HCL ER 240 MG PO TBCR: 240.0000 mg | EXTENDED_RELEASE_TABLET | Freq: Every day | ORAL | 1 refills | Status: DC

## 2022-05-06 MED ORDER — LOSARTAN POTASSIUM 100 MG PO TABS: 100.0000 mg | ORAL_TABLET | Freq: Every day | ORAL | 1 refills | Status: DC

## 2022-05-06 NOTE — Progress Notes (Signed)
Patient ID: Wendy Jackson, female  DOB: Jan 24, 1945, 77 y.o.   MRN: [id number] Patient Care Team    Relationship Specialty Notifications Start End  Natalia Leatherwood, DO PCP - General Family Medicine  12/11/20   Ihor Austin, NP Nurse Practitioner Neurology  05/23/20   Hulen Shouts, MD Referring Physician Ophthalmology  05/23/20   Dr. Lennon Alstrom Attending Physician Rheumatology  12/10/20    Comment: [place], [place]  Patton Salles, MD Consulting Physician Obstetrics and Gynecology  11/11/21     Chief Complaint  Patient presents with   Medical Management of Chronic Issues    Pt is not fasting, she took all her bp meds last night. Bp has been running in 140's instead of 130's.     Subjective: Wendy Jackson is a 77 y.o.  Female  present for Chronic Conditions/illness Management BMI less than 19,adult Patient reports she has always been rather small and has not weighed more than 100 pounds the majority of her life.   HTN/HLD/History of CVA (cerebrovascular accident)/acquired thrombophilia Pt reports compliance with losartan 100 and verapmil 180 mg . Patient denies chest pain, shortness of breath, dizziness or lower extremity edema.   Home pressures are in the 140s again.  Pt is taking a  daily baby ASA. Pt is  prescribed Crestor 40 mg nightly  Prior note: Patient reports April/2022 she suffered from a ischemic stroke.  She states the only symptom she had was paresthesia of her left hand.  She presented to the emergency room and was found to have an ischemic stroke secondary to right carotid artery stenosis.  She underwent right carotid endarterectomy.  She is established with neurology.  She has had rather significant high blood pressure recordings per EMR.  She states that her home blood pressures were usually in the high 130s when on amlodipine 10 mg daily.  However she had significant swelling in her lower extremities and amlodipine was stopped and losartan 25 mg was started.   Swelling has subsided, however her blood pressures at home are still running in the 150s systolic.  She is prescribed Crestor through her neurology team.  She is compliant with baby aspirin daily.  She has suffered from no long-term deficits from her stroke.   Hypothyroidism, unspecified type Patient reports her Armour Thyroid 30 has been stable for some time.  she is compliant with medication.      05/06/2022   12:51 PM 06/12/2021    1:56 PM 06/03/2021    1:41 PM 12/11/2020    1:46 PM 05/29/2020    6:18 PM  Depression screen PHQ 2/9  Decreased Interest 0 0 0 0 0  Down, Depressed, Hopeless 0 0 0 0 0  PHQ - 2 Score 0 0 0 0 0       No data to display          Immunization History  Administered Date(s) Administered   PFIZER(Purple Top)SARS-COV-2 Vaccination 02/20/2019, 03/16/2019, 11/11/2019, 06/15/2020   Pfizer Covid-19 Vaccine Bivalent Booster 20yrs & up 10/05/2020   Tdap 08/04/2019    Past Medical History:  Diagnosis Date   Allergy Spring 2010   Cataract 03/2019   Glaucoma Approximately 2005   Horseshoe retinal tear of left eye 04/02/2021   Hyperlipidemia LDL goal <70 01/11/2021   Hypertension 2022   Long-term use of Plaquenil 04/02/2021   Lupus    new rheum questioning original dx. continuing Plaquenil at lower dose for now.   Macular pucker, right  eye 04/02/2021   Positive TB test    Prolapse of female bladder, acquired    Pseudophakia of both eyes 04/02/2021   Retinal detachment with multiple breaks, right eye 04/02/2021   Stroke 04/2020   Thyroid disease    Allergies  Allergen Reactions   Amlodipine Other (See Comments)    Bilateral lower extremity edema   Augmentin [Amoxicillin-Pot Clavulanate] Other (See Comments)    unk   Tramadol     Nausea    Flexeril [Cyclobenzaprine] Palpitations   Past Surgical History:  Procedure Laterality Date   ENDARTERECTOMY Right 05/02/2020   Procedure: RIGHT CAROTID ENDARTERECTOMY;  Surgeon: Larina Earthly, MD;  Location:  [facility] OR;  Service: Vascular;  Laterality: Right;   EYE SURGERY  01/2020   SCLERAL BUCKLE Right 2004   TUBAL LIGATION  Feb 1982   Family History  Problem Relation Age of Onset   ALS Mother    Early death Mother    Dementia Father    Hypertension Father    Macular degeneration Maternal Aunt    Alcohol abuse Paternal Uncle    Alcohol abuse Paternal Uncle    Social History   Social History Narrative   Marital status/children/pets: Married.  2 children.   Education/employment: Retired.  College-educated.   Safety:      -smoke alarm in the home:Yes     - wears seatbelt: Yes     - Feels safe in their relationships: Yes       Allergies as of 05/06/2022       Reactions   Amlodipine Other (See Comments)   Bilateral lower extremity edema   Augmentin [amoxicillin-pot Clavulanate] Other (See Comments)   unk   Tramadol    Nausea   Flexeril [cyclobenzaprine] Palpitations        Medication List        Accurate as of May 06, 2022 11:59 PM. If you have any questions, ask your nurse or doctor.          [employer] 1 tablet by mouth in the morning and at bedtime.   Armour Thyroid 30 MG tablet Generic drug: thyroid Take 1 tablet (30 mg total) by mouth daily.   aspirin EC 81 MG tablet Take 1 tablet (81 mg total) by mouth daily. Swallow whole.   cetirizine 10 MG chewable tablet Commonly known as: ZYRTEC Chew 10 mg by mouth daily.   hydroxychloroquine 200 MG tablet Commonly known as: PLAQUENIL Take 200 mg by mouth daily.   losartan 100 MG tablet Commonly known as: COZAAR Take 1 tablet (100 mg total) by mouth daily.   Lumigan 0.01 % Soln Generic drug: bimatoprost Place 1 drop into the left eye at bedtime.   pantoprazole 40 MG tablet Commonly known as: PROTONIX Take 1 tablet (40 mg total) by mouth daily.   Resveratrol 50 MG Caps Take 50 mg by mouth daily.   rosuvastatin 40 MG tablet Commonly known as: Crestor Take 1 tablet (40 mg total) by  mouth daily.   Simbrinza 1-0.2 % Susp Generic drug: Brinzolamide-Brimonidine SHAKE LIQUID AND INSTILL 1 DROP IN LEFT EYE THREE TIMES DAILY   Ubiquinol 100 MG Caps Take 100 mg by mouth.   UP4 PROBIOTICS WOMENS PO Take 1 capsule by mouth daily.   verapamil 240 MG CR tablet Commonly known as: CALAN-SR Take 1 tablet (240 mg total) by mouth at bedtime. What changed:  medication strength how much to take Changed by: Felix Pacini, DO   Vitamin D-3  125 MCG (5000 UT) Tabs Take by mouth daily.        All past medical history, surgical history, allergies, family history, immunizations andmedications were updated in the EMR today and reviewed under the history and medication portions of their EMR.       ROS 14 pt review of systems performed and negative (unless mentioned in an HPI)  Objective: BP (!) 160/62   Pulse 66   Temp 98.2 F (36.8 C)   Ht 5\' 4"  (1.626 m)   Wt 89 lb 6.4 oz (40.6 kg)   SpO2 98%   BMI 15.35 kg/m  Physical Exam Vitals and nursing note reviewed.  Constitutional:      General: She is not in acute distress.    Appearance: Normal appearance. She is not ill-appearing, toxic-appearing or diaphoretic.  HENT:     Head: Normocephalic and atraumatic.  Eyes:     General: No scleral icterus.       Right eye: No discharge.        Left eye: No discharge.     Extraocular Movements: Extraocular movements intact.     Conjunctiva/sclera: Conjunctivae normal.     Pupils: Pupils are equal, round, and reactive to light.  Cardiovascular:     Rate and Rhythm: Normal rate and regular rhythm.  Pulmonary:     Effort: Pulmonary effort is normal. No respiratory distress.     Breath sounds: Normal breath sounds. No wheezing, rhonchi or rales.  Musculoskeletal:     Right lower leg: No edema.     Left lower leg: No edema.  Skin:    General: Skin is warm and dry.     Coloration: Skin is not jaundiced or pale.     Findings: Lesion (small rough area of skin, mild  hyperpigmentation right medial cheek) present. No erythema or rash.  Neurological:     Mental Status: She is alert and oriented to person, place, and time. Mental status is at baseline.     Motor: No weakness.     Gait: Gait normal.  Psychiatric:        Mood and Affect: Mood normal.        Behavior: Behavior normal.        Thought Content: Thought content normal.        Judgment: Judgment normal.      No results found.  Assessment/plan: Kendy Haston is a 77 y.o. female present for [facility] Hypertension/hyperlipidemia/history of CVA (cerebrovascular accident)/acquired thrombophilia/WCS Her home cuff is accurate in comparison today to our cuff readings  Continue losartan to 100 mg with close follow-up.   Continue verapamil 240 mg qhs Continue ASA 81 Low-sodium diet. Continue Crestor 40 mg daily prescribed by her neurology team. CBC, CMP, TSH and LDL collected today She is established with neurology Follow-up 5.5 mos.    Hypothyroidism, unspecified type Has been stable Continue Armour Thyroid 30 mg daily.  Appropriate refills will be provided after labs are resulted TSH and T4 free collected today  Skin lesion: Referral to derm placed for her- poss BCC  Return in about 24 weeks (around 10/21/2022) for Routine chronic condition follow-up.  Orders Placed This Encounter  Procedures   CBC   Comp Met (CMET)   TSH   T4, free   Direct LDL   Ambulatory referral to Dermatology   Meds ordered this encounter  Medications   losartan (COZAAR) 100 MG tablet    Sig: Take 1 tablet (100 mg total) by mouth daily.  Dispense:  90 tablet    Refill:  1   verapamil (CALAN-SR) 240 MG CR tablet    Sig: Take 1 tablet (240 mg total) by mouth at bedtime.    Dispense:  90 tablet    Refill:  1   Referral Orders         Ambulatory referral to Dermatology       Electronically signed by: Felix Pacini, DO [facility] [place]

## 2022-05-06 NOTE — Patient Instructions (Addendum)
Return in about 24 weeks (around 10/21/2022) for Routine chronic condition follow-up.        Great to see you today.  I have refilled the medication(s) we provide.   If labs were collected, we will inform you of lab results once received either by echart message or telephone call.   - echart message- for normal results that have been seen by the patient already.   - telephone call: abnormal results or if patient has not viewed results in their echart.  

## 2022-05-07 ENCOUNTER — Other Ambulatory Visit: Payer: Self-pay | Admitting: Family Medicine

## 2022-05-07 DIAGNOSIS — E039 Hypothyroidism, unspecified: Secondary | ICD-10-CM

## 2022-05-07 LAB — COMPREHENSIVE METABOLIC PANEL
AG Ratio: 1.8 (calc) (ref 1.0–2.5)
ALT: 27 U/L (ref 6–29)
AST: 30 U/L (ref 10–35)
Albumin: 4.3 g/dL (ref 3.6–5.1)
Alkaline phosphatase (APISO): 95 U/L (ref 37–153)
BUN: 15 mg/dL (ref 7–25)
CO2: 24 mmol/L (ref 20–32)
Calcium: 9.2 mg/dL (ref 8.6–10.4)
Chloride: 99 mmol/L (ref 98–110)
Creat: 0.65 mg/dL (ref 0.60–1.00)
Globulin: 2.4 g/dL (calc) (ref 1.9–3.7)
Glucose, Bld: 87 mg/dL (ref 65–99)
Potassium: 4 mmol/L (ref 3.5–5.3)
Sodium: 140 mmol/L (ref 135–146)
Total Bilirubin: 0.4 mg/dL (ref 0.2–1.2)
Total Protein: 6.7 g/dL (ref 6.1–8.1)

## 2022-05-07 LAB — CBC
HCT: 37.2 % (ref 35.0–45.0)
Hemoglobin: 12.4 g/dL (ref 11.7–15.5)
MCH: 30.7 pg (ref 27.0–33.0)
MCHC: 33.3 g/dL (ref 32.0–36.0)
MCV: 92.1 fL (ref 80.0–100.0)
MPV: 10.8 fL (ref 7.5–12.5)
Platelets: 221 10*3/uL (ref 140–400)
RBC: 4.04 10*6/uL (ref 3.80–5.10)
RDW: 11 % (ref 11.0–15.0)
WBC: 6.2 10*3/uL (ref 3.8–10.8)

## 2022-05-07 LAB — TSH: TSH: 1.72 mIU/L (ref 0.40–4.50)

## 2022-05-07 LAB — T4, FREE: Free T4: 0.9 ng/dL (ref 0.8–1.8)

## 2022-05-07 LAB — LDL CHOLESTEROL, DIRECT: Direct LDL: 55 mg/dL (ref <100)

## 2022-05-07 MED ORDER — ARMOUR THYROID 30 MG PO TABS: 30.0000 mg | ORAL_TABLET | Freq: Every day | ORAL | 4 refills | Status: DC

## 2022-05-10 ENCOUNTER — Encounter: Payer: Self-pay | Admitting: Gastroenterology

## 2022-05-12 ENCOUNTER — Ambulatory Visit: Payer: Medicare Other | Admitting: Obstetrics and Gynecology

## 2022-05-12 ENCOUNTER — Ambulatory Visit (INDEPENDENT_AMBULATORY_CARE_PROVIDER_SITE_OTHER): Payer: Medicare Other | Admitting: Obstetrics and Gynecology

## 2022-05-12 ENCOUNTER — Encounter: Payer: Self-pay | Admitting: Obstetrics and Gynecology

## 2022-05-12 VITALS — BP 132/68 | HR 81 | Ht 64.0 in | Wt 89.0 lb

## 2022-05-12 DIAGNOSIS — Z4689 Encounter for fitting and adjustment of other specified devices: Secondary | ICD-10-CM

## 2022-05-12 DIAGNOSIS — N812 Incomplete uterovaginal prolapse: Secondary | ICD-10-CM | POA: Diagnosis not present

## 2022-05-12 NOTE — Patient Instructions (Signed)

## 2022-05-13 NOTE — Progress Notes (Unsigned)
GYNECOLOGY  VISIT   HPI: 77 y.o.   Married  Caucasian  female   G2P0002 with No LMP recorded. Patient is postmenopausal.   here for   2 week pessary check.  Has a #3 ring with support pessary received on 05/12/22.  Does not feel the pessary.  No more vaginal bulge.  Some yellow/orange vaginal discharge occasionally.  No blood.   GYNECOLOGIC HISTORY: No LMP recorded. Patient is postmenopausal. 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   Other female genital prolapse 10/29/2021   Immunization not carried out because of parent refusal 05/06/2021   Long-term use of Plaquenil 04/02/2021   Primary open angle glaucoma (POAG) of both eyes, severe stage 04/02/2021   White coat syndrome with diagnosis of hypertension 01/11/2021   Hyperlipidemia LDL goal <70 01/11/2021   Acquired thrombophilia (HCC) 12/11/2020   BMI less than 19,adult 12/11/2020   Hypothyroidism 12/11/2020   History of CVA (cerebrovascular accident) 05/02/2020   Lupus (HCC)    Allergic rhinitis     Past Medical History:  Diagnosis Date   Allergy Spring 2010   Cataract 03/2019   Glaucoma Approximately 2005   Horseshoe retinal tear of left eye 04/02/2021   Hyperlipidemia LDL goal <70 01/11/2021   Hypertension 2022   Long-term use of Plaquenil 04/02/2021   Lupus (HCC)    new rheum questioning original dx. continuing Plaquenil at lower dose for now.   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 bladder, acquired    Pseudophakia of both eyes 04/02/2021    Retinal detachment with multiple breaks, right eye 04/02/2021   Stroke (HCC) 04/2020   Thyroid disease     Past Surgical History:  Procedure Laterality Date   ENDARTERECTOMY Right 05/02/2020   Procedure: RIGHT CAROTID ENDARTERECTOMY;  Surgeon: Larina Earthly, MD;  Location: [facility] OR;  Service: Vascular;  Laterality: Right;   EYE SURGERY  01/2020   SCLERAL BUCKLE Right 2004   TUBAL LIGATION  Feb 1982    Current Outpatient Medications  Medication Sig Dispense Refill   ARMOUR THYROID 30 MG tablet Take 1 tablet (30 mg total) by mouth daily. 90 tablet 4   aspirin EC 81 MG tablet Take 1 tablet (81 mg total) by mouth daily. Swallow whole. 30 tablet 11   cetirizine (ZYRTEC) 10 MG chewable tablet Chew 10 mg by mouth daily.     Cholecalciferol (VITAMIN D-3) 125 MCG (5000 UT) TABS Take by mouth daily.     hydroxychloroquine (PLAQUENIL) 200 MG tablet Take 200 mg by mouth daily.     losartan (COZAAR) 100 MG tablet Take 1 tablet (100 mg total) by mouth daily. 90 tablet 1   LUMIGAN 0.01 %  SOLN Place 1 drop into the left eye at bedtime.     Misc Natural Products (AIRBORNE ELDERBERRY) CHEW Chew 1 tablet by mouth in the morning and at bedtime.     pantoprazole (PROTONIX) 40 MG tablet Take 1 tablet (40 mg total) by mouth daily. 90 tablet 3   Probiotic Product (UP4 PROBIOTICS WOMENS PO) Take 1 capsule by mouth daily.     Resveratrol 50 MG CAPS Take 50 mg by mouth daily.     rosuvastatin (CRESTOR) 40 MG tablet Take 1 tablet (40 mg total) by mouth daily. 90 tablet 3   SIMBRINZA 1-0.2 % SUSP SHAKE LIQUID AND INSTILL 1 DROP IN LEFT EYE THREE TIMES DAILY     Ubiquinol 100 MG CAPS Take 100 mg by mouth.     verapamil (CALAN-SR) 240 MG CR tablet Take 1 tablet (240 mg total) by mouth at bedtime. 90 tablet 1   No current facility-administered medications for this visit.     ALLERGIES: Amlodipine, Augmentin [amoxicillin-pot clavulanate], Tramadol, and Flexeril [cyclobenzaprine]  Family History  Problem  Relation Age of Onset   ALS Mother    Early death Mother    Dementia Father    Hypertension Father    Macular degeneration Maternal Aunt    Alcohol abuse Paternal Uncle    Alcohol abuse Paternal Uncle     Social History   Socioeconomic History   Marital status: Married    Spouse name: Peyton Najjar   Number of children: 2   Years of education: Not on file   Highest education level: Some college, no degree  Occupational History   Occupation: retired   Occupation: retired  Tobacco Use   Smoking status: Never    Passive exposure: Never   Smokeless tobacco: Never  Vaping Use   Vaping Use: Never used  Substance and Sexual Activity   Alcohol use: Never   Drug use: Never   Sexual activity: Not Currently    Birth control/protection: Surgical  Other Topics Concern   Not on file  Social History Narrative   Marital status/children/pets: Married.  2 children.   Education/employment: Retired.  College-educated.   Safety:      -smoke alarm in the home:Yes     - wears seatbelt: Yes     - Feels safe in their relationships: Yes      Social Determinants of Health   Financial Resource Strain: Low Risk  (06/12/2021)   Overall Financial Resource Strain (CARDIA)    Difficulty of Paying Living Expenses: Not hard at all  Food Insecurity: No Food Insecurity (06/12/2021)   Hunger Vital Sign    Worried About Running Out of Food in the Last Year: Never true    Ran Out of Food in the Last Year: Never true  Transportation Needs: No Transportation Needs (06/12/2021)   PRAPARE - Administrator, Civil Service (Medical): No    Lack of Transportation (Non-Medical): No  Physical Activity: Insufficiently Active (06/12/2021)   Exercise Vital Sign    Days of Exercise per Week: 4 days    Minutes of Exercise per Session: 30 min  Stress: No Stress Concern Present (06/12/2021)   [employer] of Occupational Health - Occupational Stress Questionnaire    Feeling of Stress : Not at all  Social  Connections: Moderately Isolated (06/12/2021)   Social Connection and Isolation Panel [NHANES]    Frequency of Communication with Friends and Family: Twice a week    Frequency of Social Gatherings with Friends and Family:  Never    Attends Religious Services: 1 to 4 times per year    Active Member of Clubs or Organizations: No    Attends Banker Meetings: Never    Marital Status: Married  Catering manager Violence: Not At Risk (06/12/2021)   Humiliation, Afraid, Rape, and Kick questionnaire    Fear of Current or Ex-Partner: No    Emotionally Abused: No    Physically Abused: No    Sexually Abused: No    Review of Systems  All other systems reviewed and are negative.   PHYSICAL EXAMINATION:    BP 118/74 (BP Location: Left Arm, Patient Position: Sitting, Cuff Size: Normal)   Pulse 79   Ht 5\' 4"  (1.626 m)   Wt 89 lb (40.4 kg)   SpO2 98%   BMI 15.28 kg/m     General appearance: alert, cooperative and appears stated age   Pelvic: External genitalia:  no lesions              Urethra:  normal appearing urethra with no masses, tenderness or lesions              Bartholins and Skenes: normal                 Vagina: normal appearing vagina with normal color and discharge, no lesions              Cervix: no lesions                Bimanual Exam:  Uterus:  normal size, contour, position, consistency, mobility, non-tender              Adnexa: no mass, fullness, tenderness  Pessary removed, cleansed and replaced.        Patient unable to place pessary on her own.     Chaperone was present for exam:  Warren Lacy, CMA  ASSESSMENT  Incomplete uterovaginal prolapse.  Pessary maintenance.  Hx ASCUS pap with epithelial atypia and negative HR HPV, 2023.  Hx stroke.   PLAN  Continue pessary care.  Pessary care instructions reviewed. Fu 6 weeks for a pessary check.  Will re-attempt to teach patient how to remove and place pessary at that time.    23 min  total time was spent  for this patient encounter, including preparation, face-to-face counseling with the patient, coordination of care, and documentation of the encounter.

## 2022-05-22 ENCOUNTER — Telehealth: Payer: Self-pay | Admitting: Family Medicine

## 2022-05-22 NOTE — Telephone Encounter (Signed)
Patient is following up on a dermatology referral.  I informed the patient the referral was placed, however no specific Dr. or location has been assigned.  Please give the patient a call with an update on specifics regarding the referral.

## 2022-05-22 NOTE — Telephone Encounter (Signed)
Noted Pt will receive letter once referral has been placed with location

## 2022-05-26 ENCOUNTER — Encounter: Payer: Self-pay | Admitting: Obstetrics and Gynecology

## 2022-05-26 ENCOUNTER — Ambulatory Visit (INDEPENDENT_AMBULATORY_CARE_PROVIDER_SITE_OTHER): Payer: Medicare Other | Admitting: Obstetrics and Gynecology

## 2022-05-26 VITALS — BP 118/74 | HR 79 | Ht 64.0 in | Wt 89.0 lb

## 2022-05-26 DIAGNOSIS — N812 Incomplete uterovaginal prolapse: Secondary | ICD-10-CM

## 2022-05-26 DIAGNOSIS — Z4689 Encounter for fitting and adjustment of other specified devices: Secondary | ICD-10-CM

## 2022-05-26 NOTE — Patient Instructions (Signed)

## 2022-06-13 DIAGNOSIS — H524 Presbyopia: Secondary | ICD-10-CM | POA: Diagnosis not present

## 2022-06-13 DIAGNOSIS — H52202 Unspecified astigmatism, left eye: Secondary | ICD-10-CM | POA: Diagnosis not present

## 2022-06-16 ENCOUNTER — Telehealth: Payer: Self-pay

## 2022-06-16 NOTE — Telephone Encounter (Signed)
Patient states that Dr. Claiborne Billings increased Verapamil to 240mg  and is still having some swelling.  Please advise [phone]

## 2022-06-16 NOTE — Telephone Encounter (Signed)
Spoke with patient regarding results/recommendations.  

## 2022-06-23 ENCOUNTER — Ambulatory Visit (INDEPENDENT_AMBULATORY_CARE_PROVIDER_SITE_OTHER): Payer: Medicare Other | Admitting: Family Medicine

## 2022-06-23 ENCOUNTER — Ambulatory Visit (INDEPENDENT_AMBULATORY_CARE_PROVIDER_SITE_OTHER): Payer: Medicare Other

## 2022-06-23 ENCOUNTER — Encounter: Payer: Self-pay | Admitting: Family Medicine

## 2022-06-23 VITALS — BP 138/82 | HR 70 | Temp 98.5°F | Wt 92.2 lb

## 2022-06-23 DIAGNOSIS — Z0000 Encounter for general adult medical examination without abnormal findings: Secondary | ICD-10-CM | POA: Diagnosis not present

## 2022-06-23 DIAGNOSIS — R600 Localized edema: Secondary | ICD-10-CM | POA: Diagnosis not present

## 2022-06-23 MED ORDER — FUROSEMIDE 20 MG PO TABS: ORAL_TABLET | ORAL | 3 refills | Status: DC

## 2022-06-23 NOTE — Progress Notes (Signed)
Subjective:   Wendy Jackson is a 77 y.o. female who presents for Medicare Annual (Subsequent) preventive examination.  Review of Systems    Defer to PCP Cardiac Risk Factors include: advanced age (>29men, >38 women)     Objective:    There were no vitals filed for this visit. There is no height or weight on file to calculate BMI.     06/23/2022    2:32 PM 01/14/2022    3:01 PM 06/12/2021    1:58 PM 05/23/2020    1:10 PM 05/01/2020   12:00 PM 04/30/2020    6:22 PM 03/06/2020    3:52 PM  Advanced Directives  Does Patient Have a Medical Advance Directive? No No No No No No No  Does patient want to make changes to medical advance directive?       Yes (MAU/Ambulatory/Procedural Areas - Information given)  Would patient like information on creating a medical advance directive? No - Patient declined No - Patient declined Yes (MAU/Ambulatory/Procedural Areas - Information given) No - Patient declined No - Patient declined No - Patient declined     Current Medications (verified) Outpatient Encounter Medications as of 06/23/2022  Medication Sig   ARMOUR THYROID 30 MG tablet Take 1 tablet (30 mg total) by mouth daily.   aspirin EC 81 MG tablet Take 1 tablet (81 mg total) by mouth daily. Swallow whole.   cetirizine (ZYRTEC) 10 MG chewable tablet Chew 10 mg by mouth daily.   Cholecalciferol (VITAMIN D-3) 125 MCG (5000 UT) TABS Take by mouth daily.   furosemide (LASIX) 20 MG tablet 1/2-1 tab PO if needed for edema   hydroxychloroquine (PLAQUENIL) 200 MG tablet Take 200 mg by mouth daily.   losartan (COZAAR) 100 MG tablet Take 1 tablet (100 mg total) by mouth daily.   LUMIGAN 0.01 % SOLN Place 1 drop into the left eye at bedtime.   Misc Natural Products (AIRBORNE ELDERBERRY) CHEW Chew 1 tablet by mouth in the morning and at bedtime.   pantoprazole (PROTONIX) 40 MG tablet Take 1 tablet (40 mg total) by mouth daily.   Probiotic Product (UP4 PROBIOTICS WOMENS PO) Take 1 capsule by mouth daily.    Resveratrol 50 MG CAPS Take 50 mg by mouth daily.   rosuvastatin (CRESTOR) 40 MG tablet Take 1 tablet (40 mg total) by mouth daily.   SIMBRINZA 1-0.2 % SUSP SHAKE LIQUID AND INSTILL 1 DROP IN LEFT EYE THREE TIMES DAILY   Ubiquinol 100 MG CAPS Take 100 mg by mouth.   verapamil (CALAN-SR) 240 MG CR tablet Take 1 tablet (240 mg total) by mouth at bedtime.   No facility-administered encounter medications on file as of 06/23/2022.    Allergies (verified) Amlodipine, Augmentin [amoxicillin-pot clavulanate], Tramadol, and Flexeril [cyclobenzaprine]   History: Past Medical History:  Diagnosis Date   Allergy Spring 2010   Cataract 03/2019   Glaucoma Approximately 2005   Horseshoe retinal tear of left eye 04/02/2021   Hyperlipidemia LDL goal <70 01/11/2021   Hypertension 2022   Long-term use of Plaquenil 04/02/2021   Lupus (HCC)    new rheum questioning original dx. continuing Plaquenil at lower dose for now.   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 bladder, acquired    Pseudophakia of both eyes 04/02/2021   Retinal detachment with multiple breaks, right eye 04/02/2021   Stroke (HCC) 04/2020   Thyroid disease    Past Surgical History:  Procedure Laterality Date  ENDARTERECTOMY Right 05/02/2020   Procedure: RIGHT CAROTID ENDARTERECTOMY;  Surgeon: Larina Earthly, MD;  Location: [facility];  Service: Vascular;  Laterality: Right;   EYE SURGERY  01/2020   SCLERAL BUCKLE Right 2004   TUBAL LIGATION  Feb 1982   Family History  Problem Relation Age of Onset   ALS Mother    Early death Mother    Dementia Father    Hypertension Father    Macular degeneration Maternal Aunt    Alcohol abuse Paternal Uncle    Alcohol abuse Paternal Uncle    Social History   Socioeconomic History   Marital status: Married    Spouse name: Peyton Najjar   Number of children: 2   Years of education: Not on file   Highest education level: Some college, no degree  Occupational  History   Occupation: retired   Occupation: retired  Tobacco Use   Smoking status: Never    Passive exposure: Never   Smokeless tobacco: Never  Vaping Use   Vaping Use: Never used  Substance and Sexual Activity   Alcohol use: Never   Drug use: Never   Sexual activity: Not Currently    Birth control/protection: Surgical  Other Topics Concern   Not on file  Social History Narrative   Marital status/children/pets: Married.  2 children.   Education/employment: Retired.  College-educated.   Safety:      -smoke alarm in the home:Yes     - wears seatbelt: Yes     - Feels safe in their relationships: Yes      Social Determinants of Health   Financial Resource Strain: Low Risk  (06/23/2022)   Overall Financial Resource Strain (CARDIA)    Difficulty of Paying Living Expenses: Not hard at all  Food Insecurity: No Food Insecurity (06/23/2022)   Hunger Vital Sign    Worried About Running Out of Food in the Last Year: Never true    Ran Out of Food in the Last Year: Never true  Transportation Needs: No Transportation Needs (06/23/2022)   PRAPARE - Administrator, Civil Service (Medical): No    Lack of Transportation (Non-Medical): No  Physical Activity: Unknown (06/23/2022)   Exercise Vital Sign    Days of Exercise per Week: 3 days    Minutes of Exercise per Session: Patient declined  Stress: No Stress Concern Present (06/23/2022)   [employer] of Occupational Health - Occupational Stress Questionnaire    Feeling of Stress : Only a little  Social Connections: Unknown (06/23/2022)   Social Connection and Isolation Panel [NHANES]    Frequency of Communication with Friends and Family: Patient declined    Frequency of Social Gatherings with Friends and Family: Patient declined    Attends Religious Services: Patient declined    Database administrator or Organizations: Patient declined    Attends Engineer, structural: Not on file    Marital Status: Patient declined     Tobacco Counseling Counseling given: Not Answered   Clinical Intake:  Pre-visit preparation completed: No  Pain : No/denies pain     Nutritional Status: BMI <19  Underweight Nutritional Risks: None Diabetes: No  How often do you need to have someone help you when you read instructions, pamphlets, or other written materials from your doctor or pharmacy?: 1 - Never  Diabetic?no         Activities of Daily Living    06/23/2022    2:33 PM  In your present state of health, do  you have any difficulty performing the following activities:  Hearing? 0  Vision? 0  Difficulty concentrating or making decisions? 0  Walking or climbing stairs? 0  Dressing or bathing? 0  Doing errands, shopping? 0  Preparing Food and eating ? N  Using the Toilet? N  In the past six months, have you accidently leaked urine? Y  Do you have problems with loss of bowel control? Y  Managing your Medications? N  Managing your Finances? N  Housekeeping or managing your Housekeeping? N    Patient Care Team: Natalia Leatherwood, DO as PCP - General (Family Medicine) Ihor Austin, NP as Nurse Practitioner (Neurology) Hulen Shouts, MD as Referring Physician (Ophthalmology) Dr. Lennon Alstrom as Attending Physician (Rheumatology) Ardell Isaacs, Forrestine Him, MD as Consulting Physician (Obstetrics and Gynecology)  Indicate any recent Medical Services you may have received from other than [facility] providers in the past year (date may be approximate).     Assessment:   This is a routine wellness examination for [place].  Hearing/Vision screen No results found.  Dietary issues and exercise activities discussed: Current Exercise Habits: Home exercise routine, Type of exercise: walking, Time (Minutes): 30, Frequency (Times/Week): 3, Weekly Exercise (Minutes/Week): 90   Goals Addressed               This Visit's Progress     Patient Stated   On track     Increase activity      Patient Stated    On track     Stay healthy      Patient Stated (pt-stated)        Stay independent      Depression Screen    06/23/2022    2:30 PM 05/06/2022   12:51 PM 06/12/2021    1:56 PM 06/03/2021    1:41 PM 12/11/2020    1:46 PM 05/29/2020    6:18 PM 03/06/2020    3:56 PM  PHQ 2/9 Scores  PHQ - 2 Score 0 0 0 0 0 0 0    Fall Risk    06/23/2022    2:33 PM 06/19/2022    9:16 AM 05/06/2022   12:51 PM 06/12/2021    2:00 PM 06/03/2021    1:41 PM  Fall Risk   Falls in the past year? 0 0 0 1 1  Number falls in past yr: 0  0 0 0  Injury with Fall? 0  0 0 0  Risk for fall due to :   No Fall Risks Impaired vision   Follow up Falls evaluation completed  Falls evaluation completed Falls prevention discussed     FALL RISK PREVENTION PERTAINING TO THE HOME:  Any stairs in or around the home? Yes  If so, are there any without handrails? No  Home free of loose throw rugs in walkways, pet beds, electrical cords, etc? Yes  Adequate lighting in your home to reduce risk of falls? Yes   ASSISTIVE DEVICES UTILIZED TO PREVENT FALLS:  Life alert? No  Use of a cane, walker or w/c? No  Grab bars in the bathroom? No  Shower chair or bench in shower? Yes  Elevated toilet seat or a handicapped toilet? Yes   TIMED UP AND GO:  Was the test performed? Yes .  Length of time to ambulate 10 feet: 6 sec.   Gait steady and fast without use of assistive device  Cognitive Function:        06/23/2022    2:34 PM 06/12/2021  2:02 PM  6CIT Screen  What Year? 0 points 0 points  What month? 0 points 0 points  What time? 0 points 0 points  Count back from 20 0 points 0 points  Months in reverse  0 points  Repeat phrase 0 points 0 points  Total Score  0 points    Immunizations Immunization History  Administered Date(s) Administered   PFIZER(Purple Top)SARS-COV-2 Vaccination 02/20/2019, 03/16/2019, 11/11/2019, 06/15/2020   Pfizer Covid-19 Vaccine Bivalent Booster 8yrs & up 10/05/2020   Tdap 08/04/2019     TDAP status: Up to date  Flu Vaccine status: Up to date  Pneumococcal vaccine status: Declined,  Education has been provided regarding the importance of this vaccine but patient still declined. Advised may receive this vaccine at local pharmacy or Health Dept. Aware to provide a copy of the vaccination record if obtained from local pharmacy or Health Dept. Verbalized acceptance and understanding.   Covid-19 vaccine status: Completed vaccines  Qualifies for Shingles Vaccine? Yes   Zostavax completed No   Shingrix Completed?: No.    Education has been provided regarding the importance of this vaccine. Patient has been advised to call insurance company to determine out of pocket expense if they have not yet received this vaccine. Advised may also receive vaccine at local pharmacy or Health Dept. Verbalized acceptance and understanding.  Screening Tests Health Maintenance  Topic Date Due   DEXA SCAN  05/06/2023 (Originally 12/26/2011)   Pneumonia Vaccine 71+ Years old (1 of 1 - PCV) 06/23/2023 (Originally 12/26/2011)   INFLUENZA VACCINE  08/14/2022   Medicare Annual Wellness (AWV)  06/23/2023   DTaP/Tdap/Td (2 - Td or Tdap) 08/03/2029   Colonoscopy  03/16/2032   Hepatitis C Screening  Completed   HPV VACCINES  Aged Out   COVID-19 Vaccine  Discontinued   Zoster Vaccines- Shingrix  Discontinued    Health Maintenance  There are no preventive care reminders to display for this patient.   Colorectal cancer screening: No longer required.   Mammogram status: No longer required due to age.  Bone Density status: Ordered n/a. Pt provided with contact info and advised to call to schedule appt.  Lung Cancer Screening: (Low Dose CT Chest recommended if Age 59-80 years, 30 pack-year currently smoking OR have quit w/in 15years.) does not qualify.   Lung Cancer Screening Referral: n/a  Additional Screening:  Hepatitis C Screening: does qualify; Completed 05/06/21  Vision Screening:  Recommended annual ophthalmology exams for early detection of glaucoma and other disorders of the eye. Is the patient up to date with their annual eye exam?  Yes  Who is the provider or what is the name of the office in which the patient attends annual eye exams? Dr Steele Berg If pt is not established with a provider, would they like to be referred to a provider to establish care? No .   Dental Screening: Recommended annual dental exams for proper oral hygiene  Community Resource Referral / Chronic Care Management: CRR required this visit?  No   CCM required this visit?  No      Plan:     I have personally reviewed and noted the following in the patient's chart:   Medical and social history Use of alcohol, tobacco or illicit drugs  Current medications and supplements including opioid prescriptions. Patient is not currently taking opioid prescriptions. Functional ability and status Nutritional status Physical activity Advanced directives List of other physicians Hospitalizations, surgeries, and ER visits in previous 12 months Vitals Screenings to  include cognitive, depression, and falls Referrals and appointments  In addition, I have reviewed and discussed with patient certain preventive protocols, quality metrics, and best practice recommendations. A written personalized care plan for preventive services as well as general preventive health recommendations were provided to patient.     Filomena Jungling, CMA   06/23/2022   Nurse Notes: Non-Face to Face or Face to Face 4 minute visit Encounter   Ms. Angelica , Thank you for taking time to come for your Medicare Wellness Visit. I appreciate your ongoing commitment to your health goals. Please review the following plan we discussed and let me know if I can assist you in the future.   These are the goals we discussed:  Goals       Patient Stated      Increase activity      Patient Stated      Stay healthy      Patient Stated  (pt-stated)      Stay independent        This is a list of the screening recommended for you and due dates:  Health Maintenance  Topic Date Due   DEXA scan (bone density measurement)  05/06/2023*   Pneumonia Vaccine (1 of 1 - PCV) 06/23/2023*   Flu Shot  08/14/2022   Medicare Annual Wellness Visit  06/23/2023   DTaP/Tdap/Td vaccine (2 - Td or Tdap) 08/03/2029   Colon Cancer Screening  03/16/2032   Hepatitis C Screening  Completed   HPV Vaccine  Aged Out   COVID-19 Vaccine  Discontinued   Zoster (Shingles) Vaccine  Discontinued  *Topic was postponed. The date shown is not the original due date.

## 2022-06-23 NOTE — Patient Instructions (Signed)
No follow-ups on file.        Great to see you today.  I have refilled the medication(s) we provide.   If labs were collected, we will inform you of lab results once received either by echart message or telephone call.   - echart message- for normal results that have been seen by the patient already.   - telephone call: abnormal results or if patient has not viewed results in their echart.  

## 2022-06-23 NOTE — Progress Notes (Deleted)
GYNECOLOGY  VISIT   HPI: 77 y.o.   Married  Caucasian  female   [id number] with No LMP recorded. Patient is postmenopausal.   here for   pessary check  GYNECOLOGIC HISTORY: No LMP recorded. Patient is postmenopausal. 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   Other female genital prolapse 10/29/2021   Immunization not carried out because of parent refusal 05/06/2021   Long-term use of Plaquenil 04/02/2021   Primary open angle glaucoma (POAG) of both eyes, severe stage 04/02/2021   White coat syndrome with diagnosis of hypertension 01/11/2021   Hyperlipidemia LDL goal <70 01/11/2021   Acquired thrombophilia (HCC) 12/11/2020   BMI less than 19,adult 12/11/2020   Hypothyroidism 12/11/2020   History of CVA (cerebrovascular accident) 05/02/2020   Lupus (HCC)    Allergic rhinitis     Past Medical History:  Diagnosis Date   Allergy Spring 2010   Cataract 03/2019   Glaucoma Approximately 2005   Horseshoe retinal tear of left eye 04/02/2021   Hyperlipidemia LDL goal <70 01/11/2021   Hypertension 2022   Long-term use of Plaquenil 04/02/2021   Lupus (HCC)    new rheum questioning original dx. continuing Plaquenil at lower dose for now.   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 bladder, acquired    Pseudophakia of both eyes 04/02/2021   Retinal detachment with multiple breaks, right eye 04/02/2021   Stroke (HCC) 04/2020   Thyroid disease     Past Surgical History:  Procedure Laterality Date   ENDARTERECTOMY  Right 05/02/2020   Procedure: RIGHT CAROTID ENDARTERECTOMY;  Surgeon: Larina Earthly, MD;  Location: [facility];  Service: Vascular;  Laterality: Right;   EYE SURGERY  01/2020   SCLERAL BUCKLE Right 2004   TUBAL LIGATION  Feb 1982    Current Outpatient Medications  Medication Sig Dispense Refill   ARMOUR THYROID 30 MG tablet Take 1 tablet (30 mg total) by mouth daily. 90 tablet 4   aspirin EC 81 MG tablet Take 1 tablet (81 mg total) by mouth daily. Swallow whole. 30 tablet 11   cetirizine (ZYRTEC) 10 MG chewable tablet Chew 10 mg by mouth daily.     Cholecalciferol (VITAMIN D-3) 125 MCG (5000 UT) TABS Take by mouth daily.     furosemide (LASIX) 20 MG tablet 1/2-1 tab PO if needed for edema 30 tablet 3   hydroxychloroquine (PLAQUENIL) 200 MG tablet Take 200 mg by mouth daily.     losartan (COZAAR) 100 MG tablet Take 1 tablet (100 mg total) by mouth daily. 90 tablet 1   LUMIGAN 0.01 % SOLN Place 1 drop into the left eye at bedtime.     Misc  Natural Products (AIRBORNE ELDERBERRY) CHEW Chew 1 tablet by mouth in the morning and at bedtime.     pantoprazole (PROTONIX) 40 MG tablet Take 1 tablet (40 mg total) by mouth daily. 90 tablet 3   Probiotic Product (UP4 PROBIOTICS WOMENS PO) Take 1 capsule by mouth daily.     Resveratrol 50 MG CAPS Take 50 mg by mouth daily.     rosuvastatin (CRESTOR) 40 MG tablet Take 1 tablet (40 mg total) by mouth daily. 90 tablet 3   SIMBRINZA 1-0.2 % SUSP SHAKE LIQUID AND INSTILL 1 DROP IN LEFT EYE THREE TIMES DAILY     Ubiquinol 100 MG CAPS Take 100 mg by mouth.     verapamil (CALAN-SR) 240 MG CR tablet Take 1 tablet (240 mg total) by mouth at bedtime. 90 tablet 1   No current facility-administered medications for this visit.     ALLERGIES: Amlodipine, Augmentin [amoxicillin-pot clavulanate], Tramadol, and Flexeril [cyclobenzaprine]  Family History  Problem Relation Age of Onset   ALS Mother    Early death Mother    Dementia Father    Hypertension Father     Macular degeneration Maternal Aunt    Alcohol abuse Paternal Uncle    Alcohol abuse Paternal Uncle     Social History   Socioeconomic History   Marital status: Married    Spouse name: Peyton Najjar   Number of children: 2   Years of education: Not on file   Highest education level: Some college, no degree  Occupational History   Occupation: retired   Occupation: retired  Tobacco Use   Smoking status: Never    Passive exposure: Never   Smokeless tobacco: Never  Vaping Use   Vaping Use: Never used  Substance and Sexual Activity   Alcohol use: Never   Drug use: Never   Sexual activity: Not Currently    Birth control/protection: Surgical  Other Topics Concern   Not on file  Social History Narrative   Marital status/children/pets: Married.  2 children.   Education/employment: Retired.  College-educated.   Safety:      -smoke alarm in the home:Yes     - wears seatbelt: Yes     - Feels safe in their relationships: Yes      Social Determinants of Health   Financial Resource Strain: Low Risk  (06/23/2022)   Overall Financial Resource Strain (CARDIA)    Difficulty of Paying Living Expenses: Not hard at all  Food Insecurity: No Food Insecurity (06/23/2022)   Hunger Vital Sign    Worried About Running Out of Food in the Last Year: Never true    Ran Out of Food in the Last Year: Never true  Transportation Needs: No Transportation Needs (06/23/2022)   PRAPARE - Administrator, Civil Service (Medical): No    Lack of Transportation (Non-Medical): No  Physical Activity: Unknown (06/23/2022)   Exercise Vital Sign    Days of Exercise per Week: 3 days    Minutes of Exercise per Session: Patient declined  Stress: No Stress Concern Present (06/23/2022)   [employer] of Occupational Health - Occupational Stress Questionnaire    Feeling of Stress : Only a little  Social Connections: Unknown (06/23/2022)   Social Connection and Isolation Panel [NHANES]    Frequency of  Communication with Friends and Family: Patient declined    Frequency of Social Gatherings with Friends and Family: Patient declined    Attends Religious Services: Patient declined    Active Member of Clubs or  Organizations: Patient declined    Attends Banker Meetings: Not on file    Marital Status: Patient declined  Intimate Partner Violence: Not At Risk (06/23/2022)   Humiliation, Afraid, Rape, and Kick questionnaire    Fear of Current or Ex-Partner: No    Emotionally Abused: No    Physically Abused: No    Sexually Abused: No    Review of Systems  PHYSICAL EXAMINATION:    There were no vitals taken for this visit.    General appearance: alert, cooperative and appears stated age Head: Normocephalic, without obvious abnormality, atraumatic Neck: no adenopathy, supple, symmetrical, trachea midline and thyroid normal to inspection and palpation Lungs: clear to auscultation bilaterally Breasts: normal appearance, no masses or tenderness, No nipple retraction or dimpling, No nipple discharge or bleeding, No axillary or supraclavicular adenopathy Heart: regular rate and rhythm Abdomen: soft, non-tender, no masses,  no organomegaly Extremities: extremities normal, atraumatic, no cyanosis or edema Skin: Skin color, texture, turgor normal. No rashes or lesions Lymph nodes: Cervical, supraclavicular, and axillary nodes normal. No abnormal inguinal nodes palpated Neurologic: Grossly normal  Pelvic: External genitalia:  no lesions              Urethra:  normal appearing urethra with no masses, tenderness or lesions              Bartholins and Skenes: normal                 Vagina: normal appearing vagina with normal color and discharge, no lesions              Cervix: no lesions                Bimanual Exam:  Uterus:  normal size, contour, position, consistency, mobility, non-tender              Adnexa: no mass, fullness, tenderness              Rectal exam: {yes no:[id number]}.   Confirms.              Anus:  normal sphincter tone, no lesions  Chaperone was present for exam:  ***  ASSESSMENT     PLAN     An After Visit Summary was printed and given to the patient.  ______ minutes face to face time of which over 50% was spent in counseling.

## 2022-06-23 NOTE — Patient Instructions (Signed)

## 2022-06-23 NOTE — Progress Notes (Addendum)
Wendy Jackson , Sep 25, 1945, 77 y.o., female MRN: [id number] Patient Care Team    Relationship Specialty Notifications Start End  Natalia Leatherwood, DO PCP - General Family Medicine  12/11/20   Ihor Austin, NP Nurse Practitioner Neurology  05/23/20   Hulen Shouts, MD Referring Physician Ophthalmology  05/23/20   Dr. Lennon Alstrom Attending Physician Rheumatology  12/10/20    Comment: [place], [place]  Patton Salles, MD Consulting Physician Obstetrics and Gynecology  11/11/21     Chief Complaint  Patient presents with   Leg Swelling     Subjective: Wendy Jackson is a 77 y.o. Pt presents for an OV with complaints of lower extremity swelling of 2 months duration.  Associated symptoms include mild discomfort in lower extremity and ankles. Wearing compression stockings even around the house .Swelling not going down overnght now either.  Patient is prescribed calcium channel blocker for her uncontrolled blood pressure.  Blood pressure has been difficult to control but is for responded well to the calcium channel blocker.    05/06/2022   12:51 PM 06/12/2021    1:56 PM 06/03/2021    1:41 PM 12/11/2020    1:46 PM 05/29/2020    6:18 PM  Depression screen PHQ 2/9  Decreased Interest 0 0 0 0 0  Down, Depressed, Hopeless 0 0 0 0 0  PHQ - 2 Score 0 0 0 0 0    Allergies  Allergen Reactions   Amlodipine Other (See Comments)    Bilateral lower extremity edema   Augmentin [Amoxicillin-Pot Clavulanate] Other (See Comments)    unk   Tramadol     Nausea    Flexeril [Cyclobenzaprine] Palpitations   Social History   Social History Narrative   Marital status/children/pets: Married.  2 children.   Education/employment: Retired.  College-educated.   Safety:      -smoke alarm in the home:Yes     - wears seatbelt: Yes     - Feels safe in their relationships: Yes      Past Medical History:  Diagnosis Date   Allergy Spring 2010   Cataract 03/2019   Glaucoma Approximately 2005    Horseshoe retinal tear of left eye 04/02/2021   Hyperlipidemia LDL goal <70 01/11/2021   Hypertension 2022   Long-term use of Plaquenil 04/02/2021   Lupus (HCC)    new rheum questioning original dx. continuing Plaquenil at lower dose for now.   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 bladder, acquired    Pseudophakia of both eyes 04/02/2021   Retinal detachment with multiple breaks, right eye 04/02/2021   Stroke (HCC) 04/2020   Thyroid disease    Past Surgical History:  Procedure Laterality Date   ENDARTERECTOMY Right 05/02/2020   Procedure: RIGHT CAROTID ENDARTERECTOMY;  Surgeon: Larina Earthly, MD;  Location: [facility] OR;  Service: Vascular;  Laterality: Right;   EYE SURGERY  01/2020   SCLERAL BUCKLE Right 2004   TUBAL LIGATION  Feb 1982   Family History  Problem Relation Age of Onset   ALS Mother    Early death Mother    Dementia Father    Hypertension Father    Macular degeneration Maternal Aunt    Alcohol abuse Paternal Uncle    Alcohol abuse Paternal Uncle    Allergies as of 06/23/2022       Reactions   Amlodipine Other (See Comments)   Bilateral lower extremity edema  Augmentin [amoxicillin-pot Clavulanate] Other (See Comments)   unk   Tramadol    Nausea   Flexeril [cyclobenzaprine] Palpitations        Medication List        Accurate as of June 23, 2022  1:49 PM. If you have any questions, ask your nurse or doctor.          Airborne [employer] 1 tablet by mouth in the morning and at bedtime.   Armour Thyroid 30 MG tablet Generic drug: thyroid Take 1 tablet (30 mg total) by mouth daily.   aspirin EC 81 MG tablet Take 1 tablet (81 mg total) by mouth daily. Swallow whole.   cetirizine 10 MG chewable tablet Commonly known as: ZYRTEC Chew 10 mg by mouth daily.   hydroxychloroquine 200 MG tablet Commonly known as: PLAQUENIL Take 200 mg by mouth daily.   losartan 100 MG tablet Commonly known as:  COZAAR Take 1 tablet (100 mg total) by mouth daily.   Lumigan 0.01 % Soln Generic drug: bimatoprost Place 1 drop into the left eye at bedtime.   pantoprazole 40 MG tablet Commonly known as: PROTONIX Take 1 tablet (40 mg total) by mouth daily.   Resveratrol 50 MG Caps Take 50 mg by mouth daily.   rosuvastatin 40 MG tablet Commonly known as: Crestor Take 1 tablet (40 mg total) by mouth daily.   Simbrinza 1-0.2 % Susp Generic drug: Brinzolamide-Brimonidine SHAKE LIQUID AND INSTILL 1 DROP IN LEFT EYE THREE TIMES DAILY   Ubiquinol 100 MG Caps Take 100 mg by mouth.   UP4 PROBIOTICS WOMENS PO Take 1 capsule by mouth daily.   verapamil 240 MG CR tablet Commonly known as: CALAN-SR Take 1 tablet (240 mg total) by mouth at bedtime.   Vitamin D-3 125 MCG (5000 UT) Tabs Take by mouth daily.        All past medical history, surgical history, allergies, family history, immunizations andmedications were updated in the EMR today and reviewed under the history and medication portions of their EMR.     ROS Negative, with the exception of above mentioned in HPI   Objective:  BP 138/82   Pulse 70   Temp 98.5 F (36.9 C)   Wt 92 lb 3.2 oz (41.8 kg)   SpO2 100%   BMI 15.83 kg/m  Body mass index is 15.83 kg/m. Physical Exam Vitals and nursing note reviewed.  Constitutional:      General: She is not in acute distress.    Appearance: Normal appearance. She is normal weight. She is not ill-appearing or toxic-appearing.  HENT:     Head: Normocephalic and atraumatic.  Eyes:     General: No scleral icterus.       Right eye: No discharge.        Left eye: No discharge.     Extraocular Movements: Extraocular movements intact.     Conjunctiva/sclera: Conjunctivae normal.     Pupils: Pupils are equal, round, and reactive to light.  Musculoskeletal:     Right lower leg: Edema (Trace) present.     Left lower leg: Edema (Trace-+1) present.  Skin:    Findings: No rash.   Neurological:     Mental Status: She is alert and oriented to person, place, and time. Mental status is at baseline.     Motor: No weakness.     Coordination: Coordination normal.     Gait: Gait normal.  Psychiatric:        Mood and Affect:  Mood normal.        Behavior: Behavior normal.        Thought Content: Thought content normal.        Judgment: Judgment normal.    No results found. No results found. No results found for this or any previous visit (from the past 24 hour(s)).  Assessment/Plan: Deneice Wack is a 77 y.o. female present for OV for  Bilateral lower extremity edema Added low-dose Lasix 10-20 mg x 3 days then as needed.  If Lasix is needed routinely encouraged her to increase potassium content in her diet. We discussed this is most likely from the calcium channel blocker use.  If edema does not respond to Lasix, then we would need to revisit blood pressure regimen which could mean a medication that has to be taken multiple times a day.  Patient aware of options for treatment. Follow-up as needed Reviewed expectations re: course of current medical issues. Discussed self-management of symptoms. Outlined signs and symptoms indicating need for more acute intervention. Patient verbalized understanding and all questions were answered. Patient received an After-Visit Summary.  Modifier 25, Medicare wellness completed today by health coach  No orders of the defined types were placed in this encounter.  No orders of the defined types were placed in this encounter.  Referral Orders  No referral(s) requested today     Note is dictated utilizing voice recognition software. Although note has been proof read prior to signing, occasional typographical errors still can be missed. If any questions arise, please do not hesitate to call for verification.   electronically signed by:  Felix Pacini, DO  [facility] [place]

## 2022-06-23 NOTE — Addendum Note (Signed)
Addended by: Felix Pacini A on: 06/23/2022 03:54 PM   Modules accepted: Level of Service

## 2022-07-07 ENCOUNTER — Ambulatory Visit: Payer: Medicare Other | Admitting: Obstetrics and Gynecology

## 2022-07-07 ENCOUNTER — Telehealth: Payer: Self-pay

## 2022-07-07 NOTE — Telephone Encounter (Signed)
-----   Message from Hulen Luster sent at 07/07/2022  8:52 AM EDT ----- Regarding: pessary chk silva today we canceled Spoke with patient to r/s her pessary check for today since Dr Edward Jolly is out. She would like to be seen sooner than later due to some issues she had after her two week follow up visit on 05/26/2022 and did not call to come in for this problem.  Please call patient about her concerns to see if she needs to be seen sooner by another provider or OK to wait on Silva's return.  Thanks.

## 2022-07-07 NOTE — Telephone Encounter (Signed)
Spoke to pt via phone. Pt reported wanting to call regarding problem. Pt is no longer concerned and voiced understanding for scheduling with BS possibly next week.

## 2022-07-15 ENCOUNTER — Encounter: Payer: Self-pay | Admitting: Family Medicine

## 2022-07-15 ENCOUNTER — Ambulatory Visit (INDEPENDENT_AMBULATORY_CARE_PROVIDER_SITE_OTHER): Payer: Medicare Other | Admitting: Family Medicine

## 2022-07-15 VITALS — BP 132/68 | HR 70 | Temp 98.0°F | Wt 95.2 lb

## 2022-07-15 DIAGNOSIS — I872 Venous insufficiency (chronic) (peripheral): Secondary | ICD-10-CM | POA: Diagnosis not present

## 2022-07-15 DIAGNOSIS — E785 Hyperlipidemia, unspecified: Secondary | ICD-10-CM | POA: Diagnosis not present

## 2022-07-15 DIAGNOSIS — R600 Localized edema: Secondary | ICD-10-CM

## 2022-07-15 DIAGNOSIS — I10 Essential (primary) hypertension: Secondary | ICD-10-CM | POA: Diagnosis not present

## 2022-07-15 MED ORDER — FLUOCINONIDE 0.05 % EX OINT: 1.0000 | TOPICAL_OINTMENT | Freq: Two times a day (BID) | CUTANEOUS | 1 refills | Status: DC

## 2022-07-15 MED ORDER — CLONIDINE HCL 0.1 MG PO TABS: 0.1000 mg | ORAL_TABLET | Freq: Two times a day (BID) | ORAL | 0 refills | Status: DC

## 2022-07-15 MED ORDER — VERAPAMIL HCL ER 180 MG PO TBCR
180.0000 mg | EXTENDED_RELEASE_TABLET | Freq: Every day | ORAL | 0 refills | Status: DC
Start: 2022-07-15 — End: 2022-08-05

## 2022-07-15 NOTE — Patient Instructions (Addendum)
Decreased verapamil and start clonidine every 12 hours.  Next 3 days full tab of lasix  Follow up 2-3 weeks

## 2022-07-15 NOTE — Progress Notes (Unsigned)
Wendy Jackson , 05-Sep-1945, 77 y.o., female MRN: [id number] Patient Care Team    Relationship Specialty Notifications Start End  Natalia Leatherwood, DO PCP - General Family Medicine  12/11/20   Ihor Austin, NP Nurse Practitioner Neurology  05/23/20   Hulen Shouts, MD Referring Physician Ophthalmology  05/23/20   Dr. Lennon Alstrom Attending Physician Rheumatology  12/10/20    Comment: [place], [place]  Patton Salles, MD Consulting Physician Obstetrics and Gynecology  11/11/21     Chief Complaint  Patient presents with   Leg Swelling    Has now gotten up to knee with itching     Subjective: Wendy Jackson is a 77 y.o. Pt presents for an OV with complaints of lower extremity swelling of 2 months duration.  Associated symptoms include mild discomfort in lower extremity and ankles. Wearing compression stockings even around the house .Swelling not going down overnght now either.  Patient is prescribed calcium channel blocker for her uncontrolled blood pressure.  Blood pressure has been difficult to control but is for responded well to the calcium channel blocker.    06/23/2022    2:30 PM 05/06/2022   12:51 PM 06/12/2021    1:56 PM 06/03/2021    1:41 PM 12/11/2020    1:46 PM  Depression screen PHQ 2/9  Decreased Interest 0 0 0 0 0  Down, Depressed, Hopeless 0 0 0 0 0  PHQ - 2 Score 0 0 0 0 0    Allergies  Allergen Reactions   Amlodipine Other (See Comments)    Bilateral lower extremity edema   Augmentin [Amoxicillin-Pot Clavulanate] Other (See Comments)    unk   Tramadol     Nausea    Flexeril [Cyclobenzaprine] Palpitations   Social History   Social History Narrative   Marital status/children/pets: Married.  2 children.   Education/employment: Retired.  College-educated.   Safety:      -smoke alarm in the home:Yes     - wears seatbelt: Yes     - Feels safe in their relationships: Yes      Past Medical History:  Diagnosis Date   Allergy Spring 2010    Cataract 03/2019   Glaucoma Approximately 2005   Horseshoe retinal tear of left eye 04/02/2021   Hyperlipidemia LDL goal <70 01/11/2021   Hypertension 2022   Long-term use of Plaquenil 04/02/2021   Lupus (HCC)    new rheum questioning original dx. continuing Plaquenil at lower dose for now.   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 bladder, acquired    Pseudophakia of both eyes 04/02/2021   Retinal detachment with multiple breaks, right eye 04/02/2021   Stroke (HCC) 04/2020   Thyroid disease    Past Surgical History:  Procedure Laterality Date   ENDARTERECTOMY Right 05/02/2020   Procedure: RIGHT CAROTID ENDARTERECTOMY;  Surgeon: Larina Earthly, MD;  Location: [facility] OR;  Service: Vascular;  Laterality: Right;   EYE SURGERY  01/2020   SCLERAL BUCKLE Right 2004   TUBAL LIGATION  Feb 1982   Family History  Problem Relation Age of Onset   ALS Mother    Early death Mother    Dementia Father    Hypertension Father    Macular degeneration Maternal Aunt    Alcohol abuse Paternal Uncle    Alcohol abuse Paternal Uncle    Allergies as of 07/15/2022       Reactions  Amlodipine Other (See Comments)   Bilateral lower extremity edema   Augmentin [amoxicillin-pot Clavulanate] Other (See Comments)   unk   Tramadol    Nausea   Flexeril [cyclobenzaprine] Palpitations        Medication List        Accurate as of July 15, 2022  1:52 PM. If you have any questions, ask your nurse or doctor.          Airborne Elderberry 100-50 MG Chew Chew 1 tablet by mouth in the morning and at bedtime.   Armour Thyroid 30 MG tablet Generic drug: thyroid Take 1 tablet (30 mg total) by mouth daily.   aspirin EC 81 MG tablet Take 1 tablet (81 mg total) by mouth daily. Swallow whole.   cetirizine 10 MG chewable tablet Commonly known as: ZYRTEC Chew 10 mg by mouth daily.   furosemide 20 MG tablet Commonly known as: LASIX 1/2-1 tab PO if needed for edema    hydroxychloroquine 200 MG tablet Commonly known as: PLAQUENIL Take 200 mg by mouth daily.   losartan 100 MG tablet Commonly known as: COZAAR Take 1 tablet (100 mg total) by mouth daily.   Lumigan 0.01 % Soln Generic drug: bimatoprost Place 1 drop into the left eye at bedtime.   pantoprazole 40 MG tablet Commonly known as: PROTONIX Take 1 tablet (40 mg total) by mouth daily.   Resveratrol 50 MG Caps Take 50 mg by mouth daily.   rosuvastatin 40 MG tablet Commonly known as: Crestor Take 1 tablet (40 mg total) by mouth daily.   Simbrinza 1-0.2 % Susp Generic drug: Brinzolamide-Brimonidine SHAKE LIQUID AND INSTILL 1 DROP IN LEFT EYE THREE TIMES DAILY   Ubiquinol 100 MG Caps Take 100 mg by mouth.   UP4 PROBIOTICS WOMENS PO Take 1 capsule by mouth daily.   verapamil 240 MG CR tablet Commonly known as: CALAN-SR Take 1 tablet (240 mg total) by mouth at bedtime.   Vitamin D-3 125 MCG (5000 UT) Tabs Take by mouth daily.        All past medical history, surgical history, allergies, family history, immunizations andmedications were updated in the EMR today and reviewed under the history and medication portions of their EMR.     ROS Negative, with the exception of above mentioned in HPI   Objective:  BP 132/68   Pulse 70   Temp 98 F (36.7 C)   Wt 95 lb 3.2 oz (43.2 kg)   SpO2 100%   BMI 16.34 kg/m  Body mass index is 16.34 kg/m. Physical Exam Vitals and nursing note reviewed.  Constitutional:      General: She is not in acute distress.    Appearance: Normal appearance. She is normal weight. She is not ill-appearing or toxic-appearing.  HENT:     Head: Normocephalic and atraumatic.  Eyes:     General: No scleral icterus.       Right eye: No discharge.        Left eye: No discharge.     Extraocular Movements: Extraocular movements intact.     Conjunctiva/sclera: Conjunctivae normal.     Pupils: Pupils are equal, round, and reactive to light.   Musculoskeletal:     Right lower leg: Edema (Trace) present.     Left lower leg: Edema (Trace-+1) present.  Skin:    Findings: No rash.  Neurological:     Mental Status: She is alert and oriented to person, place, and time. Mental status is at baseline.  Motor: No weakness.     Coordination: Coordination normal.     Gait: Gait normal.  Psychiatric:        Mood and Affect: Mood normal.        Behavior: Behavior normal.        Thought Content: Thought content normal.        Judgment: Judgment normal.    No results found. No results found. No results found for this or any previous visit (from the past 24 hour(s)).  Assessment/Plan: Maat Bauguess is a 77 y.o. female present for OV for  Bilateral lower extremity edema Added low-dose Lasix 10-20 mg x 3 days then as needed.  If Lasix is needed routinely encouraged her to increase potassium content in her diet. We discussed this is most likely from the calcium channel blocker use.  If edema does not respond to Lasix, then we would need to revisit blood pressure regimen which could mean a medication that has to be taken multiple times a day.  Patient aware of options for treatment. Follow-up as needed Reviewed expectations re: course of current medical issues. Discussed self-management of symptoms. Outlined signs and symptoms indicating need for more acute intervention. Patient verbalized understanding and all questions were answered. Patient received an After-Visit Summary.    No orders of the defined types were placed in this encounter.  No orders of the defined types were placed in this encounter.  Referral Orders  No referral(s) requested today     Note is dictated utilizing voice recognition software. Although note has been proof read prior to signing, occasional typographical errors still can be missed. If any questions arise, please do not hesitate to call for verification.   electronically signed by:  Felix Pacini,  DO  [facility] [place]

## 2022-07-16 ENCOUNTER — Ambulatory Visit: Payer: Medicare Other | Admitting: Obstetrics and Gynecology

## 2022-07-21 ENCOUNTER — Encounter: Payer: Self-pay | Admitting: Family Medicine

## 2022-07-21 ENCOUNTER — Ambulatory Visit (INDEPENDENT_AMBULATORY_CARE_PROVIDER_SITE_OTHER): Payer: Medicare Other | Admitting: Family Medicine

## 2022-07-21 VITALS — BP 155/62 | HR 56 | Temp 98.1°F | Wt 93.8 lb

## 2022-07-21 DIAGNOSIS — I872 Venous insufficiency (chronic) (peripheral): Secondary | ICD-10-CM

## 2022-07-21 DIAGNOSIS — I10 Essential (primary) hypertension: Secondary | ICD-10-CM | POA: Diagnosis not present

## 2022-07-21 DIAGNOSIS — R600 Localized edema: Secondary | ICD-10-CM

## 2022-07-21 NOTE — Patient Instructions (Signed)
No follow-ups on file.        Great to see you today.  I have refilled the medication(s) we provide.   If labs were collected, we will inform you of lab results once received either by echart message or telephone call.   - echart message- for normal results that have been seen by the patient already.   - telephone call: abnormal results or if patient has not viewed results in their echart.  

## 2022-07-21 NOTE — Progress Notes (Signed)
Wendy Jackson , [birth year], 77 y.o., female MRN: [id number] Patient Care Team    Relationship Specialty Notifications Start End  Natalia Leatherwood, DO PCP - General Family Medicine  12/11/20   Ihor Austin, NP Nurse Practitioner Neurology  05/23/20   Hulen Shouts, MD Referring Physician Ophthalmology  05/23/20   Dr. Lennon Alstrom Attending Physician Rheumatology  12/10/20    Comment: [place], [place]  Patton Salles, MD Consulting Physician Obstetrics and Gynecology  11/11/21     Chief Complaint  Patient presents with   Rash    Moved up to knee.     Subjective: Clytee Majumdar is a 77 y.o. Pt presents for an OV with complaints of stasis rash.  Patient was seen 3 days ago for venous stasis dermatitis.  She was prescribed Lasix 20 mg over the weekend.  She reports the fluid/edema has definitely receded, but she has noticed the dermatitis has started in new location, although improved from prior locations.  She is using the steroid cream to the rash. Her verapamil was decreased and she was also started on clonidine 0.1 mg twice daily along with her losartan 100 mg daily for her blood pressure control.  She reports she is feeling more tired since starting the new medication and the Lasix as needed.  She does report her blood pressures have been in the 120s/60s at home since starting the new medication.     06/23/2022    2:30 PM 05/06/2022   12:51 PM 06/12/2021    1:56 PM 06/03/2021    1:41 PM 12/11/2020    1:46 PM  Depression screen PHQ 2/9  Decreased Interest 0 0 0 0 0  Down, Depressed, Hopeless 0 0 0 0 0  PHQ - 2 Score 0 0 0 0 0    Allergies  Allergen Reactions   Amlodipine Other (See Comments)    Bilateral lower extremity edema   Augmentin [Amoxicillin-Pot Clavulanate] Other (See Comments)    unk   Tramadol     Nausea    Flexeril [Cyclobenzaprine] Palpitations   Social History   Social History Narrative   Marital status/children/pets: Married.  2 children.    Education/employment: Retired.  College-educated.   Safety:      -smoke alarm in the home:Yes     - wears seatbelt: Yes     - Feels safe in their relationships: Yes      Past Medical History:  Diagnosis Date   Allergy Spring 2010   Cataract 03/2019   Glaucoma Approximately 2005   [place] retinal tear of left eye 04/02/2021   Hyperlipidemia LDL goal <70 01/11/2021   Hypertension 2022   Long-term use of Plaquenil 04/02/2021   Lupus (HCC)    new rheum questioning original dx. continuing Plaquenil at lower dose for now.   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 bladder, acquired    Pseudophakia of both eyes 04/02/2021   Retinal detachment with multiple breaks, right eye 04/02/2021   Stroke (HCC) 04/2020   Thyroid disease    Past Surgical History:  Procedure Laterality Date   ENDARTERECTOMY Right 05/02/2020   Procedure: RIGHT CAROTID ENDARTERECTOMY;  Surgeon: Larina Earthly, MD;  Location: [facility] OR;  Service: Vascular;  Laterality: Right;   EYE SURGERY  01/2020   SCLERAL BUCKLE Right 2004   TUBAL LIGATION  Feb 1982   Family History  Problem Relation Age of Onset  ALS Mother    Early death Mother    Dementia Father    Hypertension Father    Macular degeneration Maternal Aunt    Alcohol abuse Paternal Uncle    Alcohol abuse Paternal Uncle    Allergies as of 07/21/2022       Reactions   Amlodipine Other (See Comments)   Bilateral lower extremity edema   Augmentin [amoxicillin-pot Clavulanate] Other (See Comments)   unk   Tramadol    Nausea   Flexeril [cyclobenzaprine] Palpitations        Medication List        Accurate as of July 21, 2022  2:43 PM. If you have any questions, ask your nurse or doctor.          Airborne Elderberry 100-50 MG Chew Chew 1 tablet by mouth in the morning and at bedtime.   Armour Thyroid 30 MG tablet Generic drug: thyroid Take 1 tablet (30 mg total) by mouth daily.   aspirin EC 81 MG  tablet Take 1 tablet (81 mg total) by mouth daily. Swallow whole.   cetirizine 10 MG chewable tablet Commonly known as: ZYRTEC Chew 10 mg by mouth daily.   cloNIDine 0.1 MG tablet Commonly known as: CATAPRES Take 1 tablet (0.1 mg total) by mouth 2 (two) times daily.   fluocinonide ointment 0.05 % Commonly known as: LIDEX Apply 1 Application topically 2 (two) times daily.   furosemide 20 MG tablet Commonly known as: LASIX 1/2-1 tab PO if needed for edema   hydroxychloroquine 200 MG tablet Commonly known as: PLAQUENIL Take 200 mg by mouth daily.   losartan 100 MG tablet Commonly known as: COZAAR Take 1 tablet (100 mg total) by mouth daily.   Lumigan 0.01 % Soln Generic drug: bimatoprost Place 1 drop into the left eye at bedtime.   pantoprazole 40 MG tablet Commonly known as: PROTONIX Take 1 tablet (40 mg total) by mouth daily.   Resveratrol 50 MG Caps Take 50 mg by mouth daily.   rosuvastatin 40 MG tablet Commonly known as: Crestor Take 1 tablet (40 mg total) by mouth daily.   Simbrinza 1-0.2 % Susp Generic drug: Brinzolamide-Brimonidine SHAKE LIQUID AND INSTILL 1 DROP IN LEFT EYE THREE TIMES DAILY   Ubiquinol 100 MG Caps Take 100 mg by mouth.   UP4 PROBIOTICS WOMENS PO Take 1 capsule by mouth daily.   verapamil 180 MG CR tablet Commonly known as: CALAN-SR Take 1 tablet (180 mg total) by mouth at bedtime.   Vitamin D-3 125 MCG (5000 UT) Tabs Take by mouth daily.        All past medical history, surgical history, allergies, family history, immunizations andmedications were updated in the EMR today and reviewed under the history and medication portions of their EMR.     ROS Negative, with the exception of above mentioned in HPI   Objective:  BP (!) 155/62   Pulse (!) 56   Temp 98.1 F (36.7 C)   Wt 93 lb 12.8 oz (42.5 kg)   SpO2 100%   BMI 16.10 kg/m  Body mass index is 16.1 kg/m. Physical Exam Constitutional:      General: She is not in  acute distress.    Appearance: Normal appearance. She is not ill-appearing, toxic-appearing or diaphoretic.  HENT:     Head: Normocephalic and atraumatic.     Mouth/Throat:     Mouth: Mucous membranes are dry.  Cardiovascular:     Rate and Rhythm: Normal rate.  Pulmonary:  Effort: Pulmonary effort is normal.  Musculoskeletal:     Right lower leg: No edema.     Left lower leg: Edema (Trace) present.  Skin:    Findings: Rash present.  Neurological:     Mental Status: She is alert.      No results found. No results found. No results found for this or any previous visit (from the past 24 hour(s)).  Assessment/Plan: Avaley Stahmer is a 77 y.o. female present for OV for  Primary hypertension Patient reports her blood pressure home are excellent.  In the 120s/60s routinely. Continue losartan 100 mg daily Continue clonidine 0.1 mg twice daily-may be experiencing some fatigue from this medication. Discontinue daily scheduled Lasix.  She is dry today and dehydration could be playing a role in her fatigue. Hopefully once she discontinues the Lasix, her lower extremity edema will remain well-controlled.  This is caused by her verapamil/calcium channel blocker, unfortunately she has difficult to control blood pressure. Consider referral to cardiology if unable to balance negative side effects of the calcium channel blocker with the edema.  Bilateral lower extremity edema Improved with Lasix 20 mg over the weekend and lowering her calcium channel blocker. She can continue half a tab of Lasix for the next 2-3 days if needed.  She is not to discontinue use of Lasix daily, and only use half a tab as needed when swelling occurs.  Venous stasis dermatitis of both lower extremities Discussed with her venous stasis dermatitis takes time to heal and commonly reoccurs.  She has no ulcerations or skin breakdown.  The initial areas of rash seem to have greatly improved with the use of the steroid  medication. Continue topical steroid as needed. Keep legs elevated when able. Continue compression stockings when able, may be a mild component of heat rash present today as well.  Therefore need to find a balance between compression stockings for edema and no compression stockings during the heat.  Reviewed expectations re: course of current medical issues. Discussed self-management of symptoms. Outlined signs and symptoms indicating need for more acute intervention. Patient verbalized understanding and all questions were answered. Patient received an After-Visit Summary.    No orders of the defined types were placed in this encounter.  No orders of the defined types were placed in this encounter.  Referral Orders  No referral(s) requested today     Note is dictated utilizing voice recognition software. Although note has been proof read prior to signing, occasional typographical errors still can be missed. If any questions arise, please do not hesitate to call for verification.   electronically signed by:  Felix Pacini, DO  [facility] - OR

## 2022-07-22 ENCOUNTER — Encounter: Payer: Self-pay | Admitting: Obstetrics & Gynecology

## 2022-07-22 ENCOUNTER — Ambulatory Visit (INDEPENDENT_AMBULATORY_CARE_PROVIDER_SITE_OTHER): Payer: Medicare Other | Admitting: Obstetrics & Gynecology

## 2022-07-22 VITALS — BP 132/84

## 2022-07-22 DIAGNOSIS — Z4689 Encounter for fitting and adjustment of other specified devices: Secondary | ICD-10-CM | POA: Diagnosis not present

## 2022-07-22 NOTE — Progress Notes (Signed)
    Wendy Jackson [birth year] [id number]        77 y.o.  G2P0002  Married    RP: Pessary maintenance   HPI: Has a #3 ring with support pessary received on 05/12/22.  Does not feel the pessary. No more vaginal bulge. No vaginal discharge. No blood. No pain.     OB History  Gravida Para Term Preterm AB Living  2       0 2  SAB IAB Ectopic Multiple Live Births  0   0        # Outcome Date GA Lbr Len/2nd Weight Sex Delivery Anes PTL Lv  2 Gravida           1 Gravida             Past medical history,surgical history, problem list, medications, allergies, family history and social history were all reviewed and documented in the EPIC chart.   Directed ROS with pertinent positives and negatives documented in the history of present illness/assessment and plan.  Exam:  Vitals:   07/22/22 1316  BP: 132/84   General appearance:  Normal  Gynecologic exam: Vulva normal.  Urethral prolapse.  Pessary removed and cleaned.  Vaginal mucosa intact.  No blood, no discharge.  Pessary put back in place easily, good fit.   Assessment/Plan:  77 y.o. G2P0002   1. Pessary maintenance  Has a #3 ring with support pessary received on 05/12/22.  Does not feel the pessary. No more vaginal bulge. No vaginal discharge. No blood. No pain.   Well with Ring #3 with support.  Good fit.  No pessary Cx.  F/U with Dr Edward Jolly in 3-4 months.  Genia Del MD, 1:33 PM 07/22/2022

## 2022-07-23 ENCOUNTER — Encounter: Payer: Self-pay | Admitting: Dermatology

## 2022-07-23 ENCOUNTER — Ambulatory Visit (INDEPENDENT_AMBULATORY_CARE_PROVIDER_SITE_OTHER): Payer: Medicare Other | Admitting: Dermatology

## 2022-07-23 VITALS — BP 116/75 | HR 60

## 2022-07-23 DIAGNOSIS — L570 Actinic keratosis: Secondary | ICD-10-CM | POA: Diagnosis not present

## 2022-07-23 DIAGNOSIS — I872 Venous insufficiency (chronic) (peripheral): Secondary | ICD-10-CM | POA: Diagnosis not present

## 2022-07-23 MED ORDER — TACROLIMUS 0.1 % EX OINT: TOPICAL_OINTMENT | CUTANEOUS | 0 refills | Status: DC

## 2022-07-23 NOTE — Progress Notes (Addendum)
   New Patient Visit   Subjective  Wendy Jackson is a 77 y.o. female who presents for the following: spot on right cheek since the end of November. It is asymptomatic. It came up as a tiny red spot that wouldn't go away and would get rough. She has been putting aloe vera gel on it. Her primary told her to have it evaluated for possible precancer. Pt has no hx of skin cancer. Dad may have had a skin cancer. Pt was diagnosed last week with stasis dermatitis and given a Lidex 0.5% cream. It has been improving but pt wanted to have it evaluated while here.   The following portions of the chart were reviewed this encounter and updated as appropriate: medications, allergies, medical history  Review of Systems:  No other skin or systemic complaints except as noted in HPI or Assessment and Plan.  Objective  Well appearing patient in no apparent distress; mood and affect are within normal limits.   A focused examination was performed of the following areas: Right cheek and left temple: Pink, flat-topped papules with a fine, gritty-like texture, diagnosed as actinic keratoses.   - Bilateral lower extremities: +2 pitting edema noted, along with hyperpigmented patches, light brown in color, speckled with red spots resembling cayenne pepper, diagnosed as stasis dermatitis. Relevant exam findings are noted in the Assessment and Plan.  right cheek, left temple (2) pink flat top papule with light roughness          Assessment & Plan   1. 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 Instruct patient to apply Vaseline or aloe vera gel (if tolerated) to the treated areas twice daily for wound healing. Follow up in 3 months or sooner if new lesions appear.  2. Stasis Dermatitis with Hemosiderin Deposition on Bilateral Lower Extremities - Plan: Continue furosemide as  prescribed by the primary care physician to manage edema. Encourage patient to avoid salt, elevate legs, and wear compression stockings to prevent worsening of stasis dermatitis. Instruct patient to use Fluocinonide (Lidex) cream twice daily for two weeks, followed by a break period during which they will apply tacrolimus ointment to calm inflammation and itch. Patients will alternate between two weeks of Fluocinolone and two weeks of tacrolimus. Monitor for any signs of skin breakdown or blistering and follow up with primary care physician or dermatologist as needed.   Procedure Note  AK (actinic keratosis) (2) right cheek, left temple  Destruction of lesion - right cheek, left temple (2) Complexity: simple   Destruction method: cryotherapy   Informed consent: discussed and consent obtained   Timeout:  patient name, date of birth, surgical site, and procedure verified Lesion destroyed using liquid nitrogen: Yes   Region frozen until ice ball extended beyond lesion: Yes   Outcome: patient tolerated procedure well with no complications   Post-procedure details: wound care instructions given       Return in about 3 months (around 10/23/2022) for TBSE.  Owens Shark, CMA, am acting as scribe for [employer], DO.   Documentation: I have reviewed the above documentation for accuracy and completeness, and I agree with the above.  Langston Reusing, DO

## 2022-07-23 NOTE — Patient Instructions (Addendum)
Thank you for visiting our clinic today. We appreciate your commitment to improving your health and addressing your dermatological concerns.  Here is a summary of the key instructions from today's consultation:  - Treatment for Actinic Keratoses: We performed a liquid nitrogen freezing treatment on the spots located on your right cheek and left temple. This procedure helps destroy abnormal cells and prevent them from developing into skin cancer.   - Post-Treatment Care:     [facility] your face as usual.     - Apply Vaseline on the treated areas morning and night to protect them. You can mix Vaseline with aloe vera gel if preferred.     - It's okay to wear makeup over the area.  - Stasis Dermatitis Management:   - Medications: Continue using Lidex cream twice daily for two weeks, followed by a break period during which you should use Tacrolimus ointment to reduce inflammation and itchiness. Alternate between these two treatments every two weeks.   - Lifestyle Adjustments:     - Reduce salt intake significantly for the next two weeks, then reintroduce it gradually.     - Keep your legs elevated as much as possible.     - Wear compression stockings to help manage blood pooling and swelling.  Please monitor the treated areas and your leg condition, and do not hesitate to contact us if you have any concerns or notice any adverse reactions. We look forward to seeing you again to ensure your treatment is progressing well.        Cryotherapy Aftercare  Wash gently with soap and water everyday.   Apply Vaseline and Band-Aid daily until healed.   For Stasis dermatitis Treatment Plan: -continue topical as directed but stop after 2 weeks the start Tacrolimus 0.1% oint twice a day for 2 weeks (alternate the 2 creams) -avoid salt -elevate legs -compression hose   Due to recent changes in healthcare laws, you may see results of your pathology and/or laboratory studies on MyChart before the  doctors have had a chance to review them. We understand that in some cases there may be results that are confusing or concerning to you. Please understand that not all results are received at the same time and often the doctors may need to interpret multiple results in order to provide you with the best plan of care or course of treatment. Therefore, we ask that you please give [place] 2 business days to thoroughly review all your results before contacting the office for clarification. Should we see a critical lab result, you will be contacted sooner.   If You Need Anything After Your Visit  If you have any questions or concerns for your doctor, please call our main line at [phone] If no one answers, please leave a voicemail as directed and we will return your call as soon as possible. Messages left after 4 pm will be answered the following business day.   You may also send [place] a message via MyChart. We typically respond to MyChart messages within 1-2 business days.  For prescription refills, please ask your pharmacy to contact our office. Our fax number is [phone].  If you have an urgent issue when the clinic is closed that cannot wait until the next business day, you can page your doctor at the number below.    Please note that while we do our best to be available for urgent issues outside of office hours, we are not available 24/7.   If you  have an urgent issue and are unable to reach [place], you may choose to seek medical care at your doctor's office, retail clinic, urgent care center, or emergency room.  If you have a medical emergency, please immediately call 911 or go to the emergency department. In the event of inclement weather, please call our main line at [phone] for an update on the status of any delays or closures.  Dermatology Medication Tips: Please keep the boxes that topical medications come in in order to help keep track of the instructions about where and how to use  these. Pharmacies typically print the medication instructions only on the boxes and not directly on the medication tubes.   If your medication is too expensive, please contact our office at [phone] or send [place] a message through MyChart.   We are unable to tell what your co-pay for medications will be in advance as this is different depending on your insurance coverage. However, we may be able to find a substitute medication at lower cost or fill out paperwork to get insurance to cover a needed medication.   If a prior authorization is required to get your medication covered by your insurance company, please allow [place] 1-2 business days to complete this process.  Drug prices often vary depending on where the prescription is filled and some pharmacies may offer cheaper prices.  The website www.goodrx.com contains coupons for medications through different pharmacies. The prices here do not account for what the cost may be with help from insurance (it may be cheaper with your insurance), but the website can give you the price if you did not use any insurance.  - You can print the associated coupon and take it with your prescription to the pharmacy.  - You may also stop by our office during regular business hours and pick up a GoodRx coupon card.  - If you need your prescription sent electronically to a different pharmacy, notify our office through [facility] or by phone at [phone]

## 2022-07-27 ENCOUNTER — Encounter: Payer: Self-pay | Admitting: Dermatology

## 2022-07-28 ENCOUNTER — Other Ambulatory Visit: Payer: Self-pay

## 2022-07-28 MED ORDER — PIMECROLIMUS 1 % EX CREA: TOPICAL_CREAM | CUTANEOUS | 0 refills | Status: DC

## 2022-07-28 MED ORDER — TACROLIMUS 0.1 % EX OINT: TOPICAL_OINTMENT | Freq: Two times a day (BID) | CUTANEOUS | 0 refills | Status: DC

## 2022-07-28 NOTE — Progress Notes (Signed)
We spoke to pt and advised her to use goodrx and if this doesn't work will send in calcipitriene cream

## 2022-07-28 NOTE — Telephone Encounter (Signed)
Please  send order for Pimecrolimus cream to be applied BID for 2 weeks when taking a break from the Fluocinonide cream.  Please call pt and let her know we are changing the rx however we don't know what the cost will be.  Also, let her know the note was correct to reflect she's using Flucinonide cream and not Fluocinolone.  Thanks!

## 2022-07-30 ENCOUNTER — Telehealth: Payer: Self-pay

## 2022-07-30 NOTE — Telephone Encounter (Signed)
Patient wants to know if she should continue meds until appt next week. Patient has several questions and concerns

## 2022-07-30 NOTE — Telephone Encounter (Signed)
Spoke with patient regarding results/recommendations.  

## 2022-08-05 ENCOUNTER — Encounter: Payer: Self-pay | Admitting: Family Medicine

## 2022-08-05 ENCOUNTER — Ambulatory Visit (INDEPENDENT_AMBULATORY_CARE_PROVIDER_SITE_OTHER): Payer: Medicare Other | Admitting: Family Medicine

## 2022-08-05 VITALS — BP 162/62 | HR 66 | Temp 97.5°F | Wt 92.0 lb

## 2022-08-05 DIAGNOSIS — I10 Essential (primary) hypertension: Secondary | ICD-10-CM

## 2022-08-05 DIAGNOSIS — I872 Venous insufficiency (chronic) (peripheral): Secondary | ICD-10-CM

## 2022-08-05 MED ORDER — VERAPAMIL HCL ER 120 MG PO TBCR: 120.0000 mg | EXTENDED_RELEASE_TABLET | Freq: Every day | ORAL | 1 refills | Status: DC

## 2022-08-05 MED ORDER — HYDROCHLOROTHIAZIDE 25 MG PO TABS: 25.0000 mg | ORAL_TABLET | Freq: Every day | ORAL | 3 refills | Status: DC

## 2022-08-05 NOTE — Patient Instructions (Addendum)
  Hydrochlorothiazide started daily Decreased verapamil to lowest dose possible. Continue losartan Keep lasix in the event of increased fluid.        Great to see you today.  I have refilled the medication(s) we provide.   If labs were collected, we will inform you of lab results once received either by echart message or telephone call.   - echart message- for normal results that have been seen by the patient already.   - telephone call: abnormal results or if patient has not viewed results in their echart.

## 2022-08-05 NOTE — Progress Notes (Signed)
Wendy Jackson , [birth year], 77 y.o., female MRN: [id number] Patient Care Team    Relationship Specialty Notifications Start End  Natalia Leatherwood, DO PCP - General Family Medicine  12/11/20   Ihor Austin, NP Nurse Practitioner Neurology  05/23/20   Hulen Shouts, MD Referring Physician Ophthalmology  05/23/20   Dr. Lennon Alstrom Attending Physician Rheumatology  12/10/20    Comment: [place], [place]  Patton Salles, MD Consulting Physician Obstetrics and Gynecology  11/11/21     Chief Complaint  Patient presents with   Hypertension    See blue note     Subjective: Wendy Jackson is a 77 y.o. Pt presents for an OV to f/u on rash and BP. She stopped her clonidine - it made her too sleepy and she had low BP.  Home pressures now 135-152 systolic without clonidine. HR also dropped into 40-50s with clonidine. Mild swelling returned again without lasix daily, despite lower CCB dose. LE edema returned without use of lasiz 20 mg every day, but she was too dry with it.   Prior note: with complaints of stasis rash.  Patient was seen 3 days ago for venous stasis dermatitis.  She was prescribed Lasix 20 mg over the weekend.  She reports the fluid/edema has definitely receded, but she has noticed the dermatitis has started in new location, although improved from prior locations.  She is using the steroid cream to the rash. Her verapamil was decreased and she was also started on clonidine 0.1 mg twice daily along with her losartan 100 mg daily for her blood pressure control.  She reports she is feeling more tired since starting the new medication and the Lasix as needed.  She does report her blood pressures have been in the 120s/60s at home since starting the new medication.     06/23/2022    2:30 PM 05/06/2022   12:51 PM 06/12/2021    1:56 PM 06/03/2021    1:41 PM 12/11/2020    1:46 PM  Depression screen PHQ 2/9  Decreased Interest 0 0 0 0 0  Down, Depressed, Hopeless 0 0 0 0 0  PHQ -  2 Score 0 0 0 0 0    Allergies  Allergen Reactions   Amlodipine Other (See Comments)    Bilateral lower extremity edema   Augmentin [Amoxicillin-Pot Clavulanate] Other (See Comments)    unk   Tramadol     Nausea    Flexeril [Cyclobenzaprine] Palpitations   Social History   Social History Narrative   Marital status/children/pets: Married.  2 children.   Education/employment: Retired.  College-educated.   Safety:      -smoke alarm in the home:Yes     - wears seatbelt: Yes     - Feels safe in their relationships: Yes      Past Medical History:  Diagnosis Date   Allergy Spring 2010   Cataract 03/2019   Glaucoma Approximately 2005   Horseshoe retinal tear of left eye 04/02/2021   Hyperlipidemia LDL goal <70 01/11/2021   Hypertension 2022   Long-term use of Plaquenil 04/02/2021   Lupus (HCC)    new rheum questioning original dx. continuing Plaquenil at lower dose for now.   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 bladder, acquired    Pseudophakia of both eyes 04/02/2021   Retinal detachment with multiple breaks, right eye 04/02/2021   Stroke (HCC) 04/2020   Thyroid disease  Past Surgical History:  Procedure Laterality Date   ENDARTERECTOMY Right 05/02/2020   Procedure: RIGHT CAROTID ENDARTERECTOMY;  Surgeon: Larina Earthly, MD;  Location: [facility] OR;  Service: Vascular;  Laterality: Right;   EYE SURGERY  01/2020   SCLERAL BUCKLE Right 2004   TUBAL LIGATION  Feb 1982   Family History  Problem Relation Age of Onset   ALS Mother    Early death Mother    Dementia Father    Hypertension Father    Macular degeneration Maternal Aunt    Alcohol abuse Paternal Uncle    Alcohol abuse Paternal Uncle    Allergies as of 08/05/2022       Reactions   Amlodipine Other (See Comments)   Bilateral lower extremity edema   Augmentin [amoxicillin-pot Clavulanate] Other (See Comments)   unk   Tramadol    Nausea   Flexeril [cyclobenzaprine]  Palpitations        Medication List        Accurate as of August 05, 2022  2:40 PM. If you have any questions, ask your nurse or doctor.          STOP taking these medications    cloNIDine 0.1 MG tablet Commonly known as: CATAPRES Stopped by: Felix Pacini       TAKE these medications    Airborne Elderberry 100-50 MG Chew Chew 1 tablet by mouth in the morning and at bedtime.   Armour Thyroid 30 MG tablet Generic drug: thyroid Take 1 tablet (30 mg total) by mouth daily.   aspirin EC 81 MG tablet Take 1 tablet (81 mg total) by mouth daily. Swallow whole.   cetirizine 10 MG chewable tablet Commonly known as: ZYRTEC Chew 10 mg by mouth daily.   fluocinonide ointment 0.05 % Commonly known as: LIDEX Apply 1 Application topically 2 (two) times daily.   furosemide 20 MG tablet Commonly known as: LASIX 1/2-1 tab PO if needed for edema   hydrochlorothiazide 25 MG tablet Commonly known as: HYDRODIURIL Take 1 tablet (25 mg total) by mouth daily. Started by: Felix Pacini   hydroxychloroquine 200 MG tablet Commonly known as: PLAQUENIL Take 200 mg by mouth daily.   losartan 100 MG tablet Commonly known as: COZAAR Take 1 tablet (100 mg total) by mouth daily.   Lumigan 0.01 % Soln Generic drug: bimatoprost Place 1 drop into the left eye at bedtime.   pantoprazole 40 MG tablet Commonly known as: PROTONIX Take 1 tablet (40 mg total) by mouth daily.   Resveratrol 50 MG Caps Take 50 mg by mouth daily.   rosuvastatin 40 MG tablet Commonly known as: Crestor Take 1 tablet (40 mg total) by mouth daily.   Simbrinza 1-0.2 % Susp Generic drug: Brinzolamide-Brimonidine SHAKE LIQUID AND INSTILL 1 DROP IN LEFT EYE THREE TIMES DAILY   tacrolimus 0.1 % ointment Commonly known as: PROTOPIC Apply topically 2 (two) times daily.   Ubiquinol 100 MG Caps Take 100 mg by mouth.   UP4 PROBIOTICS WOMENS PO Take 1 capsule by mouth daily.   verapamil 120 MG CR  tablet Commonly known as: CALAN-SR Take 1 tablet (120 mg total) by mouth at bedtime. What changed:  medication strength how much to take Changed by: Felix Pacini   Vitamin D-3 125 MCG (5000 UT) Tabs Take by mouth daily.        All past medical history, surgical history, allergies, family history, immunizations andmedications were updated in the EMR today and reviewed under the history and medication portions  of their EMR.     ROS Negative, with the exception of above mentioned in HPI   Objective:  BP (!) 162/62   Pulse 66   Temp (!) 97.5 F (36.4 C)   Wt 92 lb (41.7 kg)   SpO2 100%   BMI 15.79 kg/m  Body mass index is 15.79 kg/m. Physical Exam Constitutional:      General: She is not in acute distress.    Appearance: Normal appearance. She is not ill-appearing, toxic-appearing or diaphoretic.  HENT:     Head: Normocephalic and atraumatic.     Mouth/Throat:     Mouth: Mucous membranes are dry.  Cardiovascular:     Rate and Rhythm: Normal rate.     Heart sounds: No murmur heard.    No friction rub. No gallop.  Pulmonary:     Effort: Pulmonary effort is normal. No respiratory distress.     Breath sounds: No wheezing, rhonchi or rales.  Musculoskeletal:     Right lower leg: Edema (+1) present.     Left lower leg: Edema (+1) present.  Skin:    Findings: Rash present.  Neurological:     Mental Status: She is alert.     No results found. No results found. No results found for this or any previous visit (from the past 24 hour(s)).  Assessment/Plan: Wendy Jackson is a 77 y.o. female present for OV for  Primary hypertension/LE edema Patient reports her blood pressure home are now back to mild elevation, however her LE returned (last time too dry on full dose lasix) Continue  losartan 100 mg daily Start hydrochlorothiazide 25 mg qd Will use lasix in place of hydrochlorothiazide if needed only for edema.  Decrease verapamil (again) to 120 mg qd Hopefully, with  hydrochlorothiazide added and lowest dose verapamil  her lower extremity edema will remain well-controlled and her BP controlled. If still not able to keep BP in normal range on next management, she will need referral back to cardio.   Venous stasis dermatitis of both lower extremities Discussed with her venous stasis dermatitis takes time to heal and commonly reoccurs.  She has no ulcerations or skin breakdown. Legs are much improved today Continue topical steroid as needed. Keep legs elevated when able. Continue compression stockings when able  Reviewed expectations re: course of current medical issues. Discussed self-management of symptoms. Outlined signs and symptoms indicating need for more acute intervention. Patient verbalized understanding and all questions were answered. Patient received an After-Visit Summary.    No orders of the defined types were placed in this encounter.  Meds ordered this encounter  Medications   hydrochlorothiazide (HYDRODIURIL) 25 MG tablet    Sig: Take 1 tablet (25 mg total) by mouth daily.    Dispense:  90 tablet    Refill:  3   verapamil (CALAN-SR) 120 MG CR tablet    Sig: Take 1 tablet (120 mg total) by mouth at bedtime.    Dispense:  90 tablet    Refill:  1   Referral Orders  No referral(s) requested today     Note is dictated utilizing voice recognition software. Although note has been proof read prior to signing, occasional typographical errors still can be missed. If any questions arise, please do not hesitate to call for verification.   electronically signed by:  Felix Pacini, DO  [facility] - [place]

## 2022-08-25 DIAGNOSIS — K080 Exfoliation of teeth due to systemic causes: Secondary | ICD-10-CM | POA: Diagnosis not present

## 2022-08-28 ENCOUNTER — Encounter (INDEPENDENT_AMBULATORY_CARE_PROVIDER_SITE_OTHER): Payer: Self-pay

## 2022-09-02 ENCOUNTER — Telehealth (INDEPENDENT_AMBULATORY_CARE_PROVIDER_SITE_OTHER): Payer: Medicare Other | Admitting: Family Medicine

## 2022-09-02 ENCOUNTER — Encounter: Payer: Self-pay | Admitting: Family Medicine

## 2022-09-02 DIAGNOSIS — U071 COVID-19: Secondary | ICD-10-CM

## 2022-09-02 NOTE — Patient Instructions (Addendum)
Return in about 10 years (around 09/01/2032), or if symptoms worsen or fail to improve.        Great to see you today.    If labs were collected or images ordered, we will inform you of  results once we have received them and reviewed. We will contact you either by echart message, or telephone call.  Please give ample time to the testing facility, and our office to run,  receive and review results. Please do not call inquiring of results, even if you can see them in your chart. We will contact you as soon as we are able. If it has been over 1 week since the test was completed, and you have not yet heard from [place], then please call us.    - echart message- for normal results that have been seen by the patient already.   - telephone call: abnormal results or if patient has not viewed results in their echart.  If a referral to a specialist was entered for you, please call us in 2 weeks if you have not heard from the specialist office to schedule.

## 2022-09-02 NOTE — Progress Notes (Signed)
VIRTUAL VISIT VIA VIDEO  I connected with Wendy Jackson on 09/02/22 at  8:40 AM EDT by a video enabled telemedicine application and verified that I am speaking with the correct person using two identifiers. Location patient: Home Location provider: [facility], Office Persons participating in the virtual visit: Patient, Dr. Claiborne Billings and Ivonne Andrew, CMA  I discussed the limitations of evaluation and management by telemedicine and the availability of in person appointments. The patient expressed understanding and agreed to proceed.     Wendy Jackson , [birth year], 77 y.o., female MRN: [id number] Patient Care Team    Relationship Specialty Notifications Start End  Natalia Leatherwood, DO PCP - General Family Medicine  12/11/20   Ihor Austin, NP Nurse Practitioner Neurology  05/23/20   Hulen Shouts, MD Referring Physician Ophthalmology  05/23/20   Dr. Lennon Alstrom Attending Physician Rheumatology  12/10/20    Comment: [place], [place]  Patton Salles, MD Consulting Physician Obstetrics and Gynecology  11/11/21     Chief Complaint  Patient presents with   Covid Positive    Tested yesterday; sore throat, cough; Sx onset 08/15     Subjective: Wendy Jackson is a 77 y.o. Pt presents for an OV with complaints of sore throat, cough of 5-6 days duration.  Tested positive to covid yesterday. Associated symptoms include nothing. Her husband had been ill first, he also tested positive yesterday for covid. GFR normal.      09/02/2022    8:42 AM 06/23/2022    2:30 PM 05/06/2022   12:51 PM 06/12/2021    1:56 PM 06/03/2021    1:41 PM  Depression screen PHQ 2/9  Decreased Interest 0 0 0 0 0  Down, Depressed, Hopeless 0 0 0 0 0  PHQ - 2 Score 0 0 0 0 0    Allergies  Allergen Reactions   Amlodipine Other (See Comments)    Bilateral lower extremity edema   Augmentin [Amoxicillin-Pot Clavulanate] Other (See Comments)    unk   Tramadol     Nausea    Flexeril [Cyclobenzaprine]  Palpitations   Social History   Social History Narrative   Marital status/children/pets: Married.  2 children.   Education/employment: Retired.  College-educated.   Safety:      -smoke alarm in the home:Yes     - wears seatbelt: Yes     - Feels safe in their relationships: Yes      Past Medical History:  Diagnosis Date   Allergy Spring 2010   Cataract 03/2019   Glaucoma Approximately 2005   Horseshoe retinal tear of left eye 04/02/2021   Hyperlipidemia LDL goal <70 01/11/2021   Hypertension 2022   Long-term use of Plaquenil 04/02/2021   Lupus (HCC)    new rheum questioning original dx. continuing Plaquenil at lower dose for now.   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 bladder, acquired    Pseudophakia of both eyes 04/02/2021   Retinal detachment with multiple breaks, right eye 04/02/2021   Stroke (HCC) 04/2020   Thyroid disease    Past Surgical History:  Procedure Laterality Date   ENDARTERECTOMY Right 05/02/2020   Procedure: RIGHT CAROTID ENDARTERECTOMY;  Surgeon: Larina Earthly, MD;  Location: [facility] OR;  Service: Vascular;  Laterality: Right;   EYE SURGERY  01/2020   SCLERAL BUCKLE Right 2004   TUBAL LIGATION  Feb 1982   Family History  Problem Relation  Age of Onset   ALS Mother    Early death Mother    Dementia Father    Hypertension Father    Macular degeneration Maternal Aunt    Alcohol abuse Paternal Uncle    Alcohol abuse Paternal Uncle    Allergies as of 09/02/2022       Reactions   Amlodipine Other (See Comments)   Bilateral lower extremity edema   Augmentin [amoxicillin-pot Clavulanate] Other (See Comments)   unk   Tramadol    Nausea   Flexeril [cyclobenzaprine] Palpitations        Medication List        Accurate as of September 02, 2022  9:02 AM. If you have any questions, ask your nurse or doctor.          Airborne Elderberry 100-50 MG Chew Chew 1 tablet by mouth in the morning and at bedtime.    Armour Thyroid 30 MG tablet Generic drug: thyroid Take 1 tablet (30 mg total) by mouth daily.   aspirin EC 81 MG tablet Take 1 tablet (81 mg total) by mouth daily. Swallow whole.   cetirizine 10 MG chewable tablet Commonly known as: ZYRTEC Chew 10 mg by mouth daily.   fluocinonide ointment 0.05 % Commonly known as: LIDEX Apply 1 Application topically 2 (two) times daily.   furosemide 20 MG tablet Commonly known as: LASIX 1/2-1 tab PO if needed for edema   hydrochlorothiazide 25 MG tablet Commonly known as: HYDRODIURIL Take 1 tablet (25 mg total) by mouth daily.   hydroxychloroquine 200 MG tablet Commonly known as: PLAQUENIL Take 200 mg by mouth daily.   losartan 100 MG tablet Commonly known as: COZAAR Take 1 tablet (100 mg total) by mouth daily.   Lumigan 0.01 % Soln Generic drug: bimatoprost Place 1 drop into the left eye at bedtime.   pantoprazole 40 MG tablet Commonly known as: PROTONIX Take 1 tablet (40 mg total) by mouth daily.   Resveratrol 50 MG Caps Take 50 mg by mouth daily.   rosuvastatin 40 MG tablet Commonly known as: Crestor Take 1 tablet (40 mg total) by mouth daily.   Simbrinza 1-0.2 % Susp Generic drug: Brinzolamide-Brimonidine SHAKE LIQUID AND INSTILL 1 DROP IN LEFT EYE THREE TIMES DAILY   tacrolimus 0.1 % ointment Commonly known as: PROTOPIC Apply topically 2 (two) times daily.   Ubiquinol 100 MG Caps Take 100 mg by mouth.   UP4 PROBIOTICS WOMENS PO Take 1 capsule by mouth daily.   verapamil 120 MG CR tablet Commonly known as: CALAN-SR Take 1 tablet (120 mg total) by mouth at bedtime.   Vitamin D-3 125 MCG (5000 UT) Tabs Take by mouth daily.        All past medical history, surgical history, allergies, family history, immunizations andmedications were updated in the EMR today and reviewed under the history and medication portions of their EMR.     Review of Systems  Constitutional:  Negative for chills, fever and  malaise/fatigue.  HENT:  Positive for sore throat. Negative for congestion and sinus pain.   Eyes:  Negative for pain, discharge and redness.  Respiratory:  Positive for cough. Negative for sputum production, shortness of breath and wheezing.   Gastrointestinal:  Negative for abdominal pain, diarrhea, nausea and vomiting.  Musculoskeletal:  Negative for myalgias.  Neurological:  Negative for dizziness and headaches.   Negative, with the exception of above mentioned in HPI   Objective:  There were no vitals taken for this visit. There is  no height or weight on file to calculate BMI. Physical Exam Vitals and nursing note reviewed.  Constitutional:      General: She is not in acute distress.    Appearance: Normal appearance. She is not toxic-appearing.  HENT:     Head: Normocephalic and atraumatic.  Eyes:     General: No scleral icterus.       Right eye: No discharge.        Left eye: No discharge.     Conjunctiva/sclera: Conjunctivae normal.  Pulmonary:     Effort: Pulmonary effort is normal.  Musculoskeletal:     Cervical back: Normal range of motion.  Skin:    Findings: No rash.  Neurological:     Mental Status: She is alert and oriented to person, place, and time. Mental status is at baseline.  Psychiatric:        Mood and Affect: Mood normal.        Behavior: Behavior normal.        Thought Content: Thought content normal.        Judgment: Judgment normal.      No results found. No results found. No results found for this or any previous visit (from the past 24 hour(s)).  Assessment/Plan: Wendy Jackson is a 77 y.o. female present for OV for  COVID-19 Pt on day 5-6 of illness, with very mild symptoms. We discussed the multiple drug interactions to paxlovid and the fact she is already on day 5-6 of illness, and elected not to start paxlovid. Pt agrees.  Rest, hydrate.  mucinex (DM if cough), nettie pot or nasal saline.  Reviewed home care instructions for COVID.  Advised self-isolation at home for at least 5 days. After 5 days, if improved and fever resolved, can be in public, but should wear a mask around others for an additional 5 days. If symptoms, esp, dyspnea develops/worsens, recommend in-person evaluation at either an urgent care or the emergency room.  Reviewed expectations re: course of current medical issues. Discussed self-management of symptoms. Outlined signs and symptoms indicating need for more acute intervention. Patient verbalized understanding and all questions were answered. Patient received an After-Visit Summary.    No orders of the defined types were placed in this encounter.  No orders of the defined types were placed in this encounter.  Referral Orders  No referral(s) requested today     Note is dictated utilizing voice recognition software. Although note has been proof read prior to signing, occasional typographical errors still can be missed. If any questions arise, please do not hesitate to call for verification.   electronically signed by:  Felix Pacini, DO  [facility] [place]

## 2022-09-03 ENCOUNTER — Ambulatory Visit: Payer: Medicare Other | Admitting: Gastroenterology

## 2022-09-23 NOTE — Progress Notes (Deleted)
GYNECOLOGY  VISIT   HPI: 77 y.o.   Married  Caucasian  female   G2P0002 with No LMP recorded. Patient is postmenopausal.   here for   3 mo pessary check  GYNECOLOGIC HISTORY: No LMP recorded. Patient is postmenopausal. Contraception:  PMP Menopausal hormone therapy:  n/a Last mammogram:  15 years ago per pt Last pap smear:   11/11/21 ASCUS (atrophic pattern with epithelial atypia): HR HPV neg         OB History     Gravida  2   Para      Term      Preterm      AB  0   Living  2      SAB  0   IAB      Ectopic  0   Multiple      Live Births                 Patient Active Problem List   Diagnosis Date Noted   Venous stasis dermatitis of both lower extremities 08/05/2022   Skin lesion 05/06/2022   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   Other female genital prolapse 10/29/2021   Immunization not carried out because of parent refusal 05/06/2021   Long-term use of Plaquenil 04/02/2021   Primary open angle glaucoma (POAG) of both eyes, severe stage 04/02/2021   Primary hypertension 01/11/2021   Hyperlipidemia LDL goal <70 01/11/2021   Acquired thrombophilia (HCC) 12/11/2020   BMI less than 19,adult 12/11/2020   Hypothyroidism 12/11/2020   History of CVA (cerebrovascular accident) 05/02/2020   Lupus (HCC)    Allergic rhinitis     Past Medical History:  Diagnosis Date   Allergy Spring 2010   Cataract 03/2019   Glaucoma Approximately 2005   Horseshoe retinal tear of left eye 04/02/2021   Hyperlipidemia LDL goal <70 01/11/2021   Hypertension 2022   Long-term use of Plaquenil 04/02/2021   Lupus (HCC)    new rheum questioning original dx. continuing Plaquenil at lower dose for now.   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 bladder, acquired    Pseudophakia of both eyes 04/02/2021   Retinal detachment with multiple breaks, right eye 04/02/2021   Stroke (HCC) 04/2020   Thyroid disease     Past Surgical History:   Procedure Laterality Date   ENDARTERECTOMY Right 05/02/2020   Procedure: RIGHT CAROTID ENDARTERECTOMY;  Surgeon: Larina Earthly, MD;  Location: [facility] OR;  Service: Vascular;  Laterality: Right;   EYE SURGERY  01/2020   SCLERAL BUCKLE Right 2004   TUBAL LIGATION  Feb 1982    Current Outpatient Medications  Medication Sig Dispense Refill   ARMOUR THYROID 30 MG tablet Take 1 tablet (30 mg total) by mouth daily. 90 tablet 4   aspirin EC 81 MG tablet Take 1 tablet (81 mg total) by mouth daily. Swallow whole. 30 tablet 11   cetirizine (ZYRTEC) 10 MG chewable tablet Chew 10 mg by mouth daily.     Cholecalciferol (VITAMIN D-3) 125 MCG (5000 UT) TABS Take by mouth daily.     fluocinonide ointment (LIDEX) 0.05 % Apply 1 Application topically 2 (two) times daily. 60 g 1   furosemide (LASIX) 20 MG tablet 1/2-1 tab PO if needed for edema 30 tablet 3   hydrochlorothiazide (HYDRODIURIL) 25 MG tablet Take 1 tablet (25 mg total) by mouth daily. 90 tablet 3   hydroxychloroquine (PLAQUENIL) 200 MG tablet Take 200 mg  by mouth daily.     losartan (COZAAR) 100 MG tablet Take 1 tablet (100 mg total) by mouth daily. 90 tablet 1   LUMIGAN 0.01 % SOLN Place 1 drop into the left eye at bedtime.     Misc Natural Products (AIRBORNE ELDERBERRY) CHEW Chew 1 tablet by mouth in the morning and at bedtime.     pantoprazole (PROTONIX) 40 MG tablet Take 1 tablet (40 mg total) by mouth daily. 90 tablet 3   Probiotic Product (UP4 PROBIOTICS WOMENS PO) Take 1 capsule by mouth daily.     Resveratrol 50 MG CAPS Take 50 mg by mouth daily.     rosuvastatin (CRESTOR) 40 MG tablet Take 1 tablet (40 mg total) by mouth daily. 90 tablet 3   SIMBRINZA 1-0.2 % SUSP SHAKE LIQUID AND INSTILL 1 DROP IN LEFT EYE THREE TIMES DAILY     tacrolimus (PROTOPIC) 0.1 % ointment Apply topically 2 (two) times daily. 100 g 0   Ubiquinol 100 MG CAPS Take 100 mg by mouth.     verapamil (CALAN-SR) 120 MG CR tablet Take 1 tablet (120 mg total) by mouth at  bedtime. 90 tablet 1   No current facility-administered medications for this visit.     ALLERGIES: Amlodipine, Augmentin [amoxicillin-pot clavulanate], Tramadol, and Flexeril [cyclobenzaprine]  Family History  Problem Relation Age of Onset   ALS Mother    Early death Mother    Dementia Father    Hypertension Father    Macular degeneration Maternal Aunt    Alcohol abuse Paternal Uncle    Alcohol abuse Paternal Uncle     Social History   Socioeconomic History   Marital status: Married    Spouse name: Peyton Najjar   Number of children: 2   Years of education: Not on file   Highest education level: Some college, no degree  Occupational History   Occupation: retired   Occupation: retired  Tobacco Use   Smoking status: Never    Passive exposure: Never   Smokeless tobacco: Never  Vaping Use   Vaping status: Never Used  Substance and Sexual Activity   Alcohol use: Never   Drug use: Never   Sexual activity: Not Currently    Birth control/protection: Surgical  Other Topics Concern   Not on file  Social History Narrative   Marital status/children/pets: Married.  2 children.   Education/employment: Retired.  College-educated.   Safety:      -smoke alarm in the home:Yes     - wears seatbelt: Yes     - Feels safe in their relationships: Yes      Social Determinants of Health   Financial Resource Strain: Low Risk  (06/23/2022)   Overall Financial Resource Strain (CARDIA)    Difficulty of Paying Living Expenses: Not hard at all  Food Insecurity: No Food Insecurity (06/23/2022)   Hunger Vital Sign    Worried About Running Out of Food in the Last Year: Never true    Ran Out of Food in the Last Year: Never true  Transportation Needs: No Transportation Needs (06/23/2022)   PRAPARE - Administrator, Civil Service (Medical): No    Lack of Transportation (Non-Medical): No  Physical Activity: Unknown (06/23/2022)   Exercise Vital Sign    Days of Exercise per Week: 3 days     Minutes of Exercise per Session: Patient declined  Stress: No Stress Concern Present (06/23/2022)   [employer] of Occupational Health - Occupational Stress Questionnaire    Feeling  of Stress : Only a little  Social Connections: Unknown (06/23/2022)   Social Connection and Isolation Panel [NHANES]    Frequency of Communication with Friends and Family: Patient declined    Frequency of Social Gatherings with Friends and Family: Patient declined    Attends Religious Services: Patient declined    Database administrator or Organizations: Patient declined    Attends Banker Meetings: Not on file    Marital Status: Patient declined  Intimate Partner Violence: Not At Risk (06/23/2022)   Humiliation, Afraid, Rape, and Kick questionnaire    Fear of Current or Ex-Partner: No    Emotionally Abused: No    Physically Abused: No    Sexually Abused: No    Review of Systems  PHYSICAL EXAMINATION:    There were no vitals taken for this visit.    General appearance: alert, cooperative and appears stated age Head: Normocephalic, without obvious abnormality, atraumatic Neck: no adenopathy, supple, symmetrical, trachea midline and thyroid normal to inspection and palpation Lungs: clear to auscultation bilaterally Breasts: normal appearance, no masses or tenderness, No nipple retraction or dimpling, No nipple discharge or bleeding, No axillary or supraclavicular adenopathy Heart: regular rate and rhythm Abdomen: soft, non-tender, no masses,  no organomegaly Extremities: extremities normal, atraumatic, no cyanosis or edema Skin: Skin color, texture, turgor normal. No rashes or lesions Lymph nodes: Cervical, supraclavicular, and axillary nodes normal. No abnormal inguinal nodes palpated Neurologic: Grossly normal  Pelvic: External genitalia:  no lesions              Urethra:  normal appearing urethra with no masses, tenderness or lesions              Bartholins and Skenes: normal                  Vagina: normal appearing vagina with normal color and discharge, no lesions              Cervix: no lesions                Bimanual Exam:  Uterus:  normal size, contour, position, consistency, mobility, non-tender              Adnexa: no mass, fullness, tenderness              Rectal exam: {yes no:314532}.  Confirms.              Anus:  normal sphincter tone, no lesions  Chaperone was present for exam:  ***  ASSESSMENT     PLAN     An After Visit Summary was printed and given to the patient.  ______ minutes face to face time of which over 50% was spent in counseling.

## 2022-09-30 ENCOUNTER — Telehealth: Payer: Self-pay | Admitting: Gastroenterology

## 2022-09-30 NOTE — Telephone Encounter (Signed)
Spoke with the patient. She has been taking pantoprazole since it was started by Dr Orvan Falconer in January of this year. She takes it every day. She does not feel it does anything for her. She has her "same symptoms" of bloating and feeling full. She asks if she can stop the medication. Her appointment is in October. She was going to see you sooner but contracted COVID and had to reschedule. Please advise.

## 2022-09-30 NOTE — Telephone Encounter (Signed)
PT wants to know if she should continue pantoprazole. Please advise.

## 2022-10-07 ENCOUNTER — Ambulatory Visit: Payer: Medicare Other | Admitting: Obstetrics and Gynecology

## 2022-10-08 NOTE — Progress Notes (Unsigned)
GYNECOLOGY  VISIT   HPI: 77 y.o.   Married  Caucasian  female   G2P0002 with No LMP recorded. Patient is postmenopausal.   here for   3 mo pessary check, uses number 3 ring with support.   Felt her bladder come down a couple of times at night.  Stays in with bowel movements.  Does not feel the pessary.  Some discharge at times.  No bleeding.   Using KY jelly for hydration.  Last used it a couple of nights ago.   Her last pap showed atypia, epithelial abnormality and negative HR HPV.  States she used bioidentical vaginal estrogen cream in the past.  She does not remember why she stopped using this.   GYNECOLOGIC HISTORY: No LMP recorded. Patient is postmenopausal. Contraception:  PMP Menopausal hormone therapy:  n/a Last mammogram:  15 years ago per pt Last pap smear:   11/11/21 ASCUS (atrophic pattern with epithelial atypia): HR HPV neg         OB History     Gravida  2   Para      Term      Preterm      AB  0   Living  2      SAB  0   IAB      Ectopic  0   Multiple      Live Births                 Patient Active Problem List   Diagnosis Date Noted   Venous stasis dermatitis of both lower extremities 08/05/2022   Skin lesion 05/06/2022   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   Other female genital prolapse 10/29/2021   Immunization not carried out because of parent refusal 05/06/2021   Long-term use of Plaquenil 04/02/2021   Primary open angle glaucoma (POAG) of both eyes, severe stage 04/02/2021   Primary hypertension 01/11/2021   Hyperlipidemia LDL goal <70 01/11/2021   Acquired thrombophilia (HCC) 12/11/2020   BMI less than 19,adult 12/11/2020   Hypothyroidism 12/11/2020   History of CVA (cerebrovascular accident) 05/02/2020   Lupus (HCC)    Allergic rhinitis     Past Medical History:  Diagnosis Date   Allergy Spring 2010   Cataract 03/2019   Glaucoma Approximately 2005   Horseshoe retinal tear of left eye 04/02/2021   Hyperlipidemia LDL  goal <70 01/11/2021   Hypertension 2022   Long-term use of Plaquenil 04/02/2021   Lupus (HCC)    new rheum questioning original dx. continuing Plaquenil at lower dose for now.   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 bladder, acquired    Pseudophakia of both eyes 04/02/2021   Retinal detachment with multiple breaks, right eye 04/02/2021   Stroke (HCC) 04/2020   Thyroid disease     Past Surgical History:  Procedure Laterality Date   ENDARTERECTOMY Right 05/02/2020   Procedure: RIGHT CAROTID ENDARTERECTOMY;  Surgeon: Larina Earthly, MD;  Location: [facility] OR;  Service: Vascular;  Laterality: Right;   EYE SURGERY  01/2020   SCLERAL BUCKLE Right 2004   TUBAL LIGATION  Feb 1982    Current Outpatient Medications  Medication Sig Dispense Refill   ARMOUR THYROID 30 MG tablet Take 1 tablet (30 mg total) by mouth daily. 90 tablet 4   aspirin EC 81 MG tablet Take 1 tablet (81 mg total) by mouth daily. Swallow whole. 30 tablet 11   cetirizine (ZYRTEC) 10 MG  chewable tablet Chew 10 mg by mouth daily.     Cholecalciferol (VITAMIN D-3) 125 MCG (5000 UT) TABS Take by mouth daily.     fluocinonide ointment (LIDEX) 0.05 % Apply 1 Application topically 2 (two) times daily. 60 g 1   furosemide (LASIX) 40 MG tablet Take 40 mg by mouth.     hydrochlorothiazide (HYDRODIURIL) 25 MG tablet Take 1 tablet (25 mg total) by mouth daily. 90 tablet 3   hydroxychloroquine (PLAQUENIL) 200 MG tablet Take 200 mg by mouth daily.     losartan (COZAAR) 100 MG tablet Take 1 tablet (100 mg total) by mouth daily. 90 tablet 1   LUMIGAN 0.01 % SOLN Place 1 drop into the left eye at bedtime.     Misc Natural Products (AIRBORNE ELDERBERRY) CHEW Chew 1 tablet by mouth in the morning and at bedtime.     pantoprazole (PROTONIX) 40 MG tablet Take 1 tablet (40 mg total) by mouth daily. 90 tablet 3   Probiotic Product (UP4 PROBIOTICS WOMENS PO) Take 1 capsule by mouth daily.     Resveratrol 50 MG  CAPS Take 50 mg by mouth daily.     rosuvastatin (CRESTOR) 40 MG tablet Take 1 tablet (40 mg total) by mouth daily. 90 tablet 3   SIMBRINZA 1-0.2 % SUSP SHAKE LIQUID AND INSTILL 1 DROP IN LEFT EYE THREE TIMES DAILY     tacrolimus (PROTOPIC) 0.1 % ointment Apply topically 2 (two) times daily. 100 g 0   Ubiquinol 100 MG CAPS Take 100 mg by mouth.     verapamil (CALAN-SR) 120 MG CR tablet Take 1 tablet (120 mg total) by mouth at bedtime. 90 tablet 1   No current facility-administered medications for this visit.     ALLERGIES: Amlodipine, Augmentin [amoxicillin-pot clavulanate], Tramadol, and Flexeril [cyclobenzaprine]  Family History  Problem Relation Age of Onset   ALS Mother    Early death Mother    Dementia Father    Hypertension Father    Macular degeneration Maternal Aunt    Alcohol abuse Paternal Uncle    Alcohol abuse Paternal Uncle     Social History   Socioeconomic History   Marital status: Married    Spouse name: Peyton Najjar   Number of children: 2   Years of education: Not on file   Highest education level: Some college, no degree  Occupational History   Occupation: retired   Occupation: retired  Tobacco Use   Smoking status: Never    Passive exposure: Never   Smokeless tobacco: Never  Vaping Use   Vaping status: Never Used  Substance and Sexual Activity   Alcohol use: Never   Drug use: Never   Sexual activity: Not Currently    Birth control/protection: Surgical  Other Topics Concern   Not on file  Social History Narrative   Marital status/children/pets: Married.  2 children.   Education/employment: Retired.  College-educated.   Safety:      -smoke alarm in the home:Yes     - wears seatbelt: Yes     - Feels safe in their relationships: Yes      Social Determinants of Health   Financial Resource Strain: Low Risk  (06/23/2022)   Overall Financial Resource Strain (CARDIA)    Difficulty of Paying Living Expenses: Not hard at all  Food Insecurity: No Food  Insecurity (06/23/2022)   Hunger Vital Sign    Worried About Running Out of Food in the Last Year: Never true    Ran Out of  Food in the Last Year: Never true  Transportation Needs: No Transportation Needs (06/23/2022)   PRAPARE - Administrator, Civil Service (Medical): No    Lack of Transportation (Non-Medical): No  Physical Activity: Unknown (06/23/2022)   Exercise Vital Sign    Days of Exercise per Week: 3 days    Minutes of Exercise per Session: Patient declined  Stress: No Stress Concern Present (06/23/2022)   [employer] of Occupational Health - Occupational Stress Questionnaire    Feeling of Stress : Only a little  Social Connections: Unknown (06/23/2022)   Social Connection and Isolation Panel [NHANES]    Frequency of Communication with Friends and Family: Patient declined    Frequency of Social Gatherings with Friends and Family: Patient declined    Attends Religious Services: Patient declined    Database administrator or Organizations: Patient declined    Attends Banker Meetings: Not on file    Marital Status: Patient declined  Intimate Partner Violence: Not At Risk (06/23/2022)   Humiliation, Afraid, Rape, and Kick questionnaire    Fear of Current or Ex-Partner: No    Emotionally Abused: No    Physically Abused: No    Sexually Abused: No    Review of Systems  All other systems reviewed and are negative.   PHYSICAL EXAMINATION:    Ht 5\' 4"  (1.626 m)   Wt 88 lb (39.9 kg)   BMI 15.11 kg/m     General appearance: alert, cooperative and appears stated age  Pelvic: External genitalia:  no lesions              Urethra:  normal appearing urethra with no masses, tenderness or lesions              Bartholins and Skenes: normal                 Vagina: normal appearing vagina with normal color and discharge, no lesions              Cervix: no lesions                Bimanual Exam:  Uterus:  normal size, contour, position, consistency, mobility,  non-tender              Adnexa: no mass, fullness, tenderness      Pessary removed, cleansed and replaced.  Chaperone was present for exam:  Warren Lacy, CMA  ASSESSMENT  Incomplete uterovaginal prolapse.  Pessary maintenance. Doing well.  History of abnormal pap with epithelial atypia and neg HR HPV.   PLAN  Continue pessary care.  Prior pap result reviewed with patient.  Pap and HR HPV collected.  We discussed mammogram screening.  List of facilities to patient.   Will consider vaginal estrogen cream.  We discussed potential effect on breast cancer and need for update of mammogram if we start this treatment.  FU after New Year's for next pessary check.  29 min  total time was spent for this patient encounter, including preparation, face-to-face counseling with the patient, coordination of care, and documentation of the encounter.

## 2022-10-08 NOTE — Telephone Encounter (Signed)
Called the patient. No answer. DPR on file. Left her the information on her voicemail.

## 2022-10-08 NOTE — Telephone Encounter (Signed)
It is difficult to make a recommendation without seeing the patient, I am fine with stopping pantoprazole given her symptoms have not changed.  I will discuss her issues during office visit and will try to come up with a plan to help improve her symptoms

## 2022-10-09 ENCOUNTER — Other Ambulatory Visit (HOSPITAL_COMMUNITY)
Admission: RE | Admit: 2022-10-09 | Discharge: 2022-10-09 | Disposition: A | Discharge status: Home / Self Care | Payer: Medicare Other | Source: Ambulatory Visit | Attending: Obstetrics and Gynecology | Admitting: Obstetrics and Gynecology

## 2022-10-09 ENCOUNTER — Ambulatory Visit (INDEPENDENT_AMBULATORY_CARE_PROVIDER_SITE_OTHER): Payer: Medicare Other | Admitting: Obstetrics and Gynecology

## 2022-10-09 ENCOUNTER — Encounter: Payer: Self-pay | Admitting: Obstetrics and Gynecology

## 2022-10-09 VITALS — BP 138/74 | HR 69 | Ht 64.0 in | Wt 88.0 lb

## 2022-10-09 DIAGNOSIS — Z01419 Encounter for gynecological examination (general) (routine) without abnormal findings: Secondary | ICD-10-CM | POA: Diagnosis not present

## 2022-10-09 DIAGNOSIS — Z4689 Encounter for fitting and adjustment of other specified devices: Secondary | ICD-10-CM | POA: Diagnosis not present

## 2022-10-09 DIAGNOSIS — Z1151 Encounter for screening for human papillomavirus (HPV): Secondary | ICD-10-CM | POA: Insufficient documentation

## 2022-10-09 DIAGNOSIS — Z124 Encounter for screening for malignant neoplasm of cervix: Secondary | ICD-10-CM | POA: Diagnosis not present

## 2022-10-09 DIAGNOSIS — Z8742 Personal history of other diseases of the female genital tract: Secondary | ICD-10-CM

## 2022-10-09 DIAGNOSIS — N812 Incomplete uterovaginal prolapse: Secondary | ICD-10-CM

## 2022-10-10 ENCOUNTER — Ambulatory Visit: Payer: Medicare Other | Admitting: Family Medicine

## 2022-10-13 ENCOUNTER — Encounter: Payer: Self-pay | Admitting: Dermatology

## 2022-10-13 ENCOUNTER — Ambulatory Visit (INDEPENDENT_AMBULATORY_CARE_PROVIDER_SITE_OTHER): Payer: Medicare Other | Admitting: Dermatology

## 2022-10-13 VITALS — BP 168/98 | HR 71

## 2022-10-13 DIAGNOSIS — Z872 Personal history of diseases of the skin and subcutaneous tissue: Secondary | ICD-10-CM

## 2022-10-13 DIAGNOSIS — L578 Other skin changes due to chronic exposure to nonionizing radiation: Secondary | ICD-10-CM

## 2022-10-13 DIAGNOSIS — W908XXA Exposure to other nonionizing radiation, initial encounter: Secondary | ICD-10-CM | POA: Diagnosis not present

## 2022-10-13 DIAGNOSIS — Z1283 Encounter for screening for malignant neoplasm of skin: Secondary | ICD-10-CM

## 2022-10-13 DIAGNOSIS — L821 Other seborrheic keratosis: Secondary | ICD-10-CM

## 2022-10-13 DIAGNOSIS — D229 Melanocytic nevi, unspecified: Secondary | ICD-10-CM

## 2022-10-13 DIAGNOSIS — L814 Other melanin hyperpigmentation: Secondary | ICD-10-CM

## 2022-10-13 DIAGNOSIS — D1801 Hemangioma of skin and subcutaneous tissue: Secondary | ICD-10-CM | POA: Diagnosis not present

## 2022-10-13 DIAGNOSIS — S90512A Abrasion, left ankle, initial encounter: Secondary | ICD-10-CM

## 2022-10-13 LAB — CYTOLOGY - PAP
Comment: NEGATIVE
Diagnosis: UNDETERMINED — AB
High risk HPV: NEGATIVE

## 2022-10-13 NOTE — Patient Instructions (Addendum)
Seborrheic Keratosis  What causes seborrheic keratoses? Seborrheic keratoses are harmless, common skin growths that first appear during adult life.  As time goes by, more growths appear.  Some people may develop a large number of them.  Seborrheic keratoses appear on both covered and uncovered body parts.  They are not caused by sunlight.  The tendency to develop seborrheic keratoses can be inherited.  They vary in color from skin-colored to gray, brown, or even black.  They can be either smooth or have a rough, warty surface.   Seborrheic keratoses are superficial and look as if they were stuck on the skin.  Under the microscope this type of keratosis looks like layers upon layers of skin.  That is why at times the top layer may seem to fall off, but the rest of the growth remains and re-grows.    Treatment Seborrheic keratoses do not need to be treated, but can easily be removed in the office.  Seborrheic keratoses often cause symptoms when they rub on clothing or jewelry.  Lesions can be in the way of shaving.  If they become inflamed, they can cause itching, soreness, or burning.  Removal of a seborrheic keratosis can be accomplished by freezing, burning, or surgery. If any spot bleeds, scabs, or grows rapidly, please return to have it checked, as these can be an indication of a skin cancer.    Important Information  Due to recent changes in healthcare laws, you may see results of your pathology and/or laboratory studies on MyChart before the doctors have had a chance to review them. We understand that in some cases there may be results that are confusing or concerning to you. Please understand that not all results are received at the same time and often the doctors may need to interpret multiple results in order to provide you with the best plan of care or course of treatment. Therefore, we ask that you please give [place] 2 business days to thoroughly review all your results before contacting  the office for clarification. Should we see a critical lab result, you will be contacted sooner.   If You Need Anything After Your Visit  If you have any questions or concerns for your doctor, please call our main line at [phone] If no one answers, please leave a voicemail as directed and we will return your call as soon as possible. Messages left after 4 pm will be answered the following business day.   You may also send [place] a message via MyChart. We typically respond to MyChart messages within 1-2 business days.  For prescription refills, please ask your pharmacy to contact our office. Our fax number is [phone].  If you have an urgent issue when the clinic is closed that cannot wait until the next business day, you can page your doctor at the number below.    Please note that while we do our best to be available for urgent issues outside of office hours, we are not available 24/7.   If you have an urgent issue and are unable to reach [place], you may choose to seek medical care at your doctor's office, retail clinic, urgent care center, or emergency room.  If you have a medical emergency, please immediately call 911 or go to the emergency department. In the event of inclement weather, please call our main line at [phone] for an update on the status of any delays or closures.  Dermatology Medication Tips: Please keep the boxes that  topical medications come in in order to help keep track of the instructions about where and how to use these. Pharmacies typically print the medication instructions only on the boxes and not directly on the medication tubes.   If your medication is too expensive, please contact our office at [phone] or send [place] a message through MyChart.   We are unable to tell what your co-pay for medications will be in advance as this is different depending on your insurance coverage. However, we may be able to find a substitute medication at lower cost or fill out  paperwork to get insurance to cover a needed medication.   If a prior authorization is required to get your medication covered by your insurance company, please allow [place] 1-2 business days to complete this process.  Drug prices often vary depending on where the prescription is filled and some pharmacies may offer cheaper prices.  The website www.goodrx.com contains coupons for medications through different pharmacies. The prices here do not account for what the cost may be with help from insurance (it may be cheaper with your insurance), but the website can give you the price if you did not use any insurance.  - You can print the associated coupon and take it with your prescription to the pharmacy.  - You may also stop by our office during regular business hours and pick up a GoodRx coupon card.  - If you need your prescription sent electronically to a different pharmacy, notify our office through [facility] or by phone at [phone]

## 2022-10-13 NOTE — Progress Notes (Signed)
   Follow-Up Visit   Subjective  Wendy Jackson is a 77 y.o. female who presents for the following: Skin Cancer Screening and Full Body Skin Exam Hx of aks No hx of skin cancer  The patient presents for Total-Body Skin Exam (TBSE) for skin cancer screening and mole check. The patient has spots, moles and lesions to be evaluated, some may be new or changing and the patient may have concern these could be cancer.    The following portions of the chart were reviewed this encounter and updated as appropriate: medications, allergies, medical history  Review of Systems:  No other skin or systemic complaints except as noted in HPI or Assessment and Plan.  Objective  Well appearing patient in no apparent distress; mood and affect are within normal limits.  A full examination was performed including scalp, head, eyes, ears, nose, lips, neck, chest, axillae, abdomen, back, buttocks, bilateral upper extremities, bilateral lower extremities, hands, feet, fingers, toes, fingernails, and toenails. All findings within normal limits unless otherwise noted below.   Relevant physical exam findings are noted in the Assessment and Plan.    Assessment & Plan   SKIN CANCER SCREENING PERFORMED TODAY.  ACTINIC DAMAGE (mild) - Chronic condition, secondary to cumulative UV/sun exposure - diffuse scaly erythematous macules with underlying dyspigmentation - Recommend daily broad spectrum sunscreen SPF 30+ to sun-exposed areas, reapply every 2 hours as needed.  - Staying in the shade or wearing long sleeves, sun glasses (UVA+UVB protection) and wide brim hats (4-inch brim around the entire circumference of the hat) are also recommended for sun protection.  - Call for new or changing lesions.  LENTIGINES, SEBORRHEIC KERATOSES, HEMANGIOMAS - Benign normal skin lesions - Benign-appearing - Call for any changes Sks at chest   MELANOCYTIC NEVI - Tan-brown and/or pink-flesh-colored symmetric macules and  papules - Benign appearing on exam today - Observation - Call clinic for new or changing moles - Recommend daily use of broad spectrum spf 30+ sunscreen to sun-exposed areas.   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 - these may recur and new lesions may form requiring treatment to prevent transformation into skin cancer - observe for new or changing spots and contact [facility] for appointment if occur - photoprotection with sun protective clothing; sunglasses and broad spectrum sunscreen with SPF of at least 30 + and frequent self skin exams recommended - yearly exams by a dermatologist recommended for persons with history of PreCancerous Actinic Keratoses   No follow-ups on file.  I, Asher Muir, CMA, am acting as scribe for [employer], DO.   Documentation: I have reviewed the above documentation for accuracy and completeness, and I agree with the above.  Langston Reusing, DO

## 2022-10-15 ENCOUNTER — Other Ambulatory Visit: Payer: Self-pay | Admitting: Obstetrics and Gynecology

## 2022-10-15 DIAGNOSIS — N879 Dysplasia of cervix uteri, unspecified: Secondary | ICD-10-CM

## 2022-10-20 ENCOUNTER — Other Ambulatory Visit: Payer: Self-pay | Admitting: Obstetrics and Gynecology

## 2022-10-20 DIAGNOSIS — Z1231 Encounter for screening mammogram for malignant neoplasm of breast: Secondary | ICD-10-CM

## 2022-11-03 ENCOUNTER — Other Ambulatory Visit: Payer: Self-pay

## 2022-11-03 MED ORDER — LOSARTAN POTASSIUM 100 MG PO TABS: 100.0000 mg | ORAL_TABLET | Freq: Every day | ORAL | 0 refills | Status: DC

## 2022-11-03 NOTE — Telephone Encounter (Signed)
RF request for losartan (COZAAR) 100 MG tablet  LOV: 09/02/22 Next ov: 11/25/22 Last written: 05/06/22 (90,1)  Refill provided until upcoming appt

## 2022-11-12 ENCOUNTER — Ambulatory Visit: Payer: Medicare Other | Admitting: Gastroenterology

## 2022-11-13 ENCOUNTER — Ambulatory Visit
Admission: RE | Admit: 2022-11-13 | Discharge: 2022-11-13 | Disposition: A | Discharge status: Home / Self Care | Payer: Medicare Other | Source: Ambulatory Visit | Attending: Obstetrics and Gynecology | Admitting: Obstetrics and Gynecology

## 2022-11-13 DIAGNOSIS — Z1231 Encounter for screening mammogram for malignant neoplasm of breast: Secondary | ICD-10-CM | POA: Diagnosis not present

## 2022-11-14 ENCOUNTER — Other Ambulatory Visit (INDEPENDENT_AMBULATORY_CARE_PROVIDER_SITE_OTHER): Payer: Self-pay

## 2022-11-14 ENCOUNTER — Ambulatory Visit: Payer: Medicare Other | Admitting: Orthopaedic Surgery

## 2022-11-14 ENCOUNTER — Encounter: Payer: Self-pay | Admitting: Orthopaedic Surgery

## 2022-11-14 ENCOUNTER — Telehealth: Payer: Self-pay | Admitting: *Deleted

## 2022-11-14 VITALS — BP 215/88 | HR 81 | Ht 64.0 in | Wt 88.0 lb

## 2022-11-14 DIAGNOSIS — M25551 Pain in right hip: Secondary | ICD-10-CM

## 2022-11-14 DIAGNOSIS — M4156 Other secondary scoliosis, lumbar region: Secondary | ICD-10-CM | POA: Diagnosis not present

## 2022-11-14 MED ORDER — PREDNISONE 10 MG (21) PO TBPK: ORAL_TABLET | ORAL | 0 refills | Status: DC

## 2022-11-14 NOTE — Progress Notes (Unsigned)
Office Visit Note   Patient: Wendy Jackson           Date of Birth: [birth year]           MRN: [id number] Visit Date: 11/14/2022              Requested by: Natalia Leatherwood, DO [address] [place],  [place] [address] PCP: Natalia Leatherwood, DO   Assessment & Plan: Visit Diagnoses:  1. Pain in right hip   2. Other secondary scoliosis, lumbar region     Plan: Patient with a lumbar scoliosis and referred hip pain on the right.  Will set her up with some physical therapy for treatment of this.  Discussed and reviewed with her hip x-rays which do not show degenerative changes.  Pains likely coming from her lumbar spine with her intermittent symptoms more symptomatic recently.  I will follow-up with her after therapy.  Follow-Up Instructions: Return in about 6 weeks (around 12/26/2022).   Orders:  Orders Placed This Encounter  Procedures   XR HIP UNILAT W OR W/O PELVIS 2-3 VIEWS RIGHT   Ambulatory referral to Physical Therapy   Meds ordered this encounter  Medications   predniSONE (STERAPRED UNI-PAK 21 TAB) 10 MG (21) TBPK tablet    Sig: Take 6,5,4,3,2,1 one tablet less each day with food    Dispense:  21 tablet    Refill:  0      Procedures: No procedures performed   Clinical Data: No additional findings.   Subjective: Chief Complaint  Patient presents with   Right Hip - Pain    HPI 77 year old female with bilateral hip pain worse on the right than left.  Pain is mostly lateral difficulty with stairs.  Patient states a while back she had some problems with left knee injury thinks may be favoring the right side may have aggravated the right side.  She has used Tylenol with some relief.  States she has trouble walking and she walks a lot she knows she begins to limp.  Review of Systems no claudication symptoms no associated bowel or bladder symptoms.   Objective: Vital Signs: BP (!) 215/88 (BP Location: Left Arm)   Pulse 81   Ht 5\' 4"  (1.626 m)   Wt 88 lb (39.9 kg)    BMI 15.11 kg/m   Physical Exam Constitutional:      Appearance: She is well-developed.  HENT:     Head: Normocephalic.     Right Ear: External ear normal.     Left Ear: External ear normal. There is no impacted cerumen.  Eyes:     Pupils: Pupils are equal, round, and reactive to light.  Neck:     Thyroid: No thyromegaly.     Trachea: No tracheal deviation.  Cardiovascular:     Rate and Rhythm: Normal rate.  Pulmonary:     Effort: Pulmonary effort is normal.  Abdominal:     Palpations: Abdomen is soft.  Musculoskeletal:     Cervical back: No rigidity.  Skin:    General: Skin is warm and dry.  Neurological:     Mental Status: She is alert and oriented to person, place, and time.  Psychiatric:        Behavior: Behavior normal.     Ortho Exam pelvis is level.  Left lumbar curvature approximately 30 degrees clinically.  Hip flexors knee and ankle jerk are intact Station foot is intact.  Tenderness over the trochanter worse on the right than left  negative logroll hips no hip flexion contracture.  Good quad strength hip flexors quads hamstrings ankle dorsiflexion plantarflexion.  Pulses are 2+.  Specialty Comments:  No specialty comments available.  Imaging: AP pelvis including both hips and frog-leg right hip obtained and  reviewed.  This shows normal hip anatomy no degenerative changes no acute  changes.  Lumbar scoliosis again noted similar to February 2022 CT abdomen  and pelvis scout films.   Impression: Normal hip joints.  Lumbar scoliosis.    PMFS History: Patient Active Problem List   Diagnosis Date Noted   Other secondary scoliosis, lumbar region 11/17/2022   Venous stasis dermatitis of both lower extremities 08/05/2022   Skin lesion 05/06/2022   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   Other female genital prolapse 10/29/2021   Immunization not carried out because of parent refusal 05/06/2021   Long-term use of Plaquenil 04/02/2021   Primary open angle glaucoma (POAG)  of both eyes, severe stage 04/02/2021   Primary hypertension 01/11/2021   Hyperlipidemia LDL goal <70 01/11/2021   Acquired thrombophilia (HCC) 12/11/2020   BMI less than 19,adult 12/11/2020   Hypothyroidism 12/11/2020   History of CVA (cerebrovascular accident) 05/02/2020   Lupus    Allergic rhinitis    Past Medical History:  Diagnosis Date   Allergy Spring 2010   Cataract 03/2019   Glaucoma Approximately 2005   Horseshoe retinal tear of left eye 04/02/2021   Hyperlipidemia LDL goal <70 01/11/2021   Hypertension 2022   Long-term use of Plaquenil 04/02/2021   Lupus    new rheum questioning original dx. continuing Plaquenil at lower dose for now.   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 bladder, acquired    Pseudophakia of both eyes 04/02/2021   Retinal detachment with multiple breaks, right eye 04/02/2021   Stroke (HCC) 04/2020   Thyroid disease     Family History  Problem Relation Age of Onset   ALS Mother    Early death Mother    Dementia Father    Hypertension Father    Macular degeneration Maternal Aunt    Alcohol abuse Paternal Uncle    Alcohol abuse Paternal Uncle     Past Surgical History:  Procedure Laterality Date   ENDARTERECTOMY Right 05/02/2020   Procedure: RIGHT CAROTID ENDARTERECTOMY;  Surgeon: Larina Earthly, MD;  [facility];  Service: Vascular;  Laterality: Right;   EYE SURGERY  01/2020   SCLERAL BUCKLE Right 2004   TUBAL LIGATION  Feb 1982   Social History   Occupational History   Occupation: retired   Occupation: retired  Tobacco Use   Smoking status: Never    Passive exposure: Never   Smokeless tobacco: Never  Vaping Use   Vaping status: Never Used  Substance and Sexual Activity   Alcohol use: Never   Drug use: Never   Sexual activity: Not Currently    Birth control/protection: Surgical

## 2022-11-14 NOTE — Telephone Encounter (Signed)
Patient left message stating MMG completed on 11/13/22. Results pending in EPIC.   Patient will need Rx for vaginal estrogen -see PAP dated 10/09/22.   Routing to Dr. Edward Jolly

## 2022-11-17 DIAGNOSIS — M4156 Other secondary scoliosis, lumbar region: Secondary | ICD-10-CM | POA: Insufficient documentation

## 2022-11-18 DIAGNOSIS — K080 Exfoliation of teeth due to systemic causes: Secondary | ICD-10-CM | POA: Diagnosis not present

## 2022-11-19 ENCOUNTER — Other Ambulatory Visit: Payer: Self-pay | Admitting: Obstetrics and Gynecology

## 2022-11-19 MED ORDER — ESTRADIOL 0.1 MG/GM VA CREA: TOPICAL_CREAM | VAGINAL | 1 refills | Status: DC

## 2022-11-19 NOTE — Progress Notes (Signed)
Rx for vaginal estradiol cream.

## 2022-11-19 NOTE — Telephone Encounter (Signed)
Mammogram normal.  Rx for estradiol cream sent to pharmacy.  Patient informed through result note. Encounter closed.

## 2022-11-20 ENCOUNTER — Encounter: Payer: Self-pay | Admitting: Cardiology

## 2022-11-20 DIAGNOSIS — H35371 Puckering of macula, right eye: Secondary | ICD-10-CM | POA: Diagnosis not present

## 2022-11-20 DIAGNOSIS — H33021 Retinal detachment with multiple breaks, right eye: Secondary | ICD-10-CM | POA: Diagnosis not present

## 2022-11-20 DIAGNOSIS — Z79899 Other long term (current) drug therapy: Secondary | ICD-10-CM | POA: Diagnosis not present

## 2022-11-20 DIAGNOSIS — H401133 Primary open-angle glaucoma, bilateral, severe stage: Secondary | ICD-10-CM | POA: Diagnosis not present

## 2022-11-20 DIAGNOSIS — H33312 Horseshoe tear of retina without detachment, left eye: Secondary | ICD-10-CM | POA: Diagnosis not present

## 2022-11-20 DIAGNOSIS — Z961 Presence of intraocular lens: Secondary | ICD-10-CM | POA: Diagnosis not present

## 2022-11-20 NOTE — Progress Notes (Signed)
Cardiology Office Note   Date:  11/21/2022   ID:  Kristy Feijoo, DOB 26-Jul-1945, MRN [id number]  PCP:  Natalia Leatherwood, DO  Cardiologist:   None Referring:  Natalia Leatherwood, DO  Chief Complaint  Patient presents with   Fatigue      History of Present Illness: Wendy Jackson is a 77 y.o. female who presents for evaluation of fatigue and difficult to control hypertension.  She has a history of CVA 2022.  She has had PVD with with right carotid endarterectomy.  I do see a previous echocardiogram at that time which was fairly unremarkable.   She has had multiple manipulations blood pressure medicines trying to find something will control her blood pressure.  She reports it to be spiking but I was able to review the readings from her phone and her average blood pressures in the 131/61 range.  She does fluctuate from systolics to the mid to high 140s down to the 120s.  However, it seems to be relatively well-controlled on the current regimen of medicines.  She has been on higher doses of verapamil.  She has been on clonidine.  She has been on Norvasc.  She has had fatigue and lower extremity swelling.  She thinks that her lower extremity swelling is gone away with prednisone which she is taking.  She has been prescribed this for a questionable diagnosis of lupus although she does not think that is an accurate diagnosis.  Her biggest complaint is fatigue which she wonders if that could be related to her current medical regimen.  She used to have lots of energy and now she and her husband say that she fatigues more easily with physical activity.  She is not having any new shortness of breath, PND or orthopnea.  She has decreased exercise tolerance.  She has no new chest pressure, neck or arm discomfort.  She has had mild lower extremity swelling.  She has had a 10 pound weight loss she said since she has been on a gluten-free diet which she did because of her lupus.  She has occasional  palpitations.   Past Medical History:  Diagnosis Date   Cataract 03/2019   Glaucoma Approximately 2005   Horseshoe retinal tear of left eye 04/02/2021   Hyperlipidemia LDL goal <70 01/11/2021   Hypertension 2022   Long-term use of Plaquenil 04/02/2021   Lupus    new rheum questioning original dx. continuing Plaquenil at lower dose for now.   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 bladder, acquired    Pseudophakia of both eyes 04/02/2021   Retinal detachment with multiple breaks, right eye 04/02/2021   Stroke (HCC) 04/2020   Thyroid disease     Past Surgical History:  Procedure Laterality Date   ENDARTERECTOMY Right 05/02/2020   Procedure: RIGHT CAROTID ENDARTERECTOMY;  Surgeon: Larina Earthly, MD;  Location[facility] OR;  Service: Vascular;  Laterality: Right;   EYE SURGERY  01/2020   SCLERAL BUCKLE Right 2004   TUBAL LIGATION  Feb 1982     Current Outpatient Medications  Medication Sig Dispense Refill   ARMOUR THYROID 30 MG tablet Take 1 tablet (30 mg total) by mouth daily. 90 tablet 4   aspirin EC 81 MG tablet Take 1 tablet (81 mg total) by mouth daily. Swallow whole. 30 tablet 11   cetirizine (ZYRTEC) 10 MG chewable tablet Chew 10 mg by mouth daily.  Cholecalciferol (VITAMIN D-3) 125 MCG (5000 UT) TABS Take by mouth daily.     estradiol (ESTRACE) 0.1 MG/GM vaginal cream Use 1/2 gram vaginally every night for the first 2 weeks, then use 1/2 gram three times per week at night.  Do not use the vaginal estrogen cream for 5 nights prior to your procedure. 42.5 g 1   fluocinonide ointment (LIDEX) 0.05 % Apply 1 Application topically 2 (two) times daily. 60 g 1   furosemide (LASIX) 40 MG tablet Take 40 mg by mouth.     hydrochlorothiazide (HYDRODIURIL) 25 MG tablet Take 1 tablet (25 mg total) by mouth daily. 90 tablet 3   hydroxychloroquine (PLAQUENIL) 200 MG tablet Take 200 mg by mouth daily.     losartan (COZAAR) 100 MG tablet Take 1 tablet  (100 mg total) by mouth daily. MUST KEEP APPT FOR FURTHER REFILLS 30 tablet 0   LUMIGAN 0.01 % SOLN Place 1 drop into the left eye at bedtime.     Misc Natural Products (AIRBORNE ELDERBERRY) CHEW Chew 1 tablet by mouth in the morning and at bedtime.     pantoprazole (PROTONIX) 40 MG tablet Take 1 tablet (40 mg total) by mouth daily. 90 tablet 3   predniSONE (STERAPRED UNI-PAK 21 TAB) 10 MG (21) TBPK tablet Take 6,5,4,3,2,1 one tablet less each day with food 21 tablet 0   Probiotic Product (UP4 PROBIOTICS WOMENS PO) Take 1 capsule by mouth daily.     Resveratrol 50 MG CAPS Take 50 mg by mouth daily.     rosuvastatin (CRESTOR) 40 MG tablet Take 1 tablet (40 mg total) by mouth daily. 90 tablet 3   SIMBRINZA 1-0.2 % SUSP SHAKE LIQUID AND INSTILL 1 DROP IN LEFT EYE THREE TIMES DAILY     tacrolimus (PROTOPIC) 0.1 % ointment Apply topically 2 (two) times daily. 100 g 0   Ubiquinol 100 MG CAPS Take 100 mg by mouth.     verapamil (CALAN-SR) 120 MG CR tablet Take 1 tablet (120 mg total) by mouth at bedtime. 90 tablet 1   No current facility-administered medications for this visit.    Allergies:   Amlodipine, Augmentin [amoxicillin-pot clavulanate], Tramadol, and Flexeril [cyclobenzaprine]    Social History:  The patient  reports that she has never smoked. She has never been exposed to tobacco smoke. She has never used smokeless tobacco. She reports that she does not drink alcohol and does not use drugs.   Family History:  The patient's family history includes ALS in her mother; Alcohol abuse in her paternal uncle and paternal uncle; Dementia in her father; Early death in her mother; Hypertension in her father; Macular degeneration in her maternal aunt.    ROS:  Please see the history of present illness.   Otherwise, review of systems are positive for none.   All other systems are reviewed and negative.    PHYSICAL EXAM: VS:  BP (!) 158/90 (BP Location: Left Arm, Patient Position: Sitting, Cuff  Size: Normal)   Pulse 68   Ht 5\' 4"  (1.626 m)   Wt 87 lb 12.8 oz (39.8 kg)   SpO2 99%   BMI 15.07 kg/m  , BMI Body mass index is 15.07 kg/m. GENERAL:  Well appearing HEENT:  Pupils equal round and reactive, fundi not visualized, oral mucosa unremarkable NECK:  No jugular venous distention, waveform within normal limits, carotid upstroke brisk and symmetric, no bruits, no thyromegaly LYMPHATICS:  No cervical, inguinal adenopathy LUNGS:  Clear to auscultation bilaterally BACK:  No CVA tenderness CHEST:  Unremarkable HEART:  PMI not displaced or sustained,S1 and S2 within normal limits, no S3, no S4, no clicks, no rubs, no murmurs ABD:  Flat, positive bowel sounds normal in frequency in pitch, no bruits, no rebound, no guarding, no midline pulsatile mass, no hepatomegaly, no splenomegaly EXT:  2 plus pulses throughout, no edema, no cyanosis no clubbing SKIN:  No rashes no nodules NEURO:  Cranial nerves II through XII grossly intact, motor grossly intact throughout [facility]:  Cognitively intact, oriented to person place and time    EKG:  EKG Interpretation Date/Time:  Friday November 21 2022 11:32:49 EST Ventricular Rate:  72 PR Interval:  124 QRS Duration:  96 QT Interval:  416 QTC Calculation: 455 R Axis:   89  Text Interpretation: Sinus rhythm with marked sinus arrhythmia No significant change since last tracing When compared with ECG of 01-May-2020 00:19,  ectopy is new Confirmed by Rollene Rotunda ([id number]) on 11/21/2022 11:50:36 AM     Recent Labs: 05/06/2022: ALT 27; BUN 15; Creat 0.65; Hemoglobin 12.4; Platelets 221; Potassium 4.0; Sodium 140; TSH 1.72    Lipid Panel    Component Value Date/Time   CHOL 161 05/06/2021 1006   CHOL 196 10/23/2020 1304   TRIG 69.0 05/06/2021 1006   HDL 91.40 05/06/2021 1006   HDL 94 10/23/2020 1304   CHOLHDL 2 05/06/2021 1006   VLDL 13.8 05/06/2021 1006   LDLCALC 56 05/06/2021 1006   LDLCALC 88 10/23/2020 1304   LDLDIRECT 55 05/06/2022  1321      Wt Readings from Last 3 Encounters:  11/21/22 87 lb 12.8 oz (39.8 kg)  11/14/22 88 lb (39.9 kg)  10/09/22 88 lb (39.9 kg)      Other studies Reviewed: Additional studies/ records that were reviewed today include: Labs. Review of the above records demonstrates:  Please see elsewhere in the note.     ASSESSMENT AND PLAN:  Carotid stenosis: She has is followed by [facility] and she needs continued risk reduction.  Decreased exercise tolerance: Given her vascular disease and this symptom I would like to screen her for obstructive coronary disease as this could be an anginal equivalent.  She would be able to walk on a treadmill.  Therefore, she will have a [employer].  Hypertension: I think her regimen is probably the best that she can be on right now in terms of side effect profile and efficacy.  I will check a UA and a renal artery Doppler to look for secondary causes of her hypertension.  I will see her back and consider other etiologies such as adrenal involvement in the future.  Arrhythmia: Patient has frequent ectopy.  I am not sure that this is causing any of her fatigue or other symptoms but I will have her wear a 3-day monitor.  Fatigue: She is not anemic.  Her thyroid is well-controlled.  I will go look for other causes as above.   Current medicines are reviewed at length with the patient today.  The patient does not have concerns regarding medicines.  The following changes have been made:  no change  Labs/ tests ordered today include:    Orders Placed This Encounter  Procedures   Urinalysis   LONG TERM MONITOR (3-14 DAYS)   MYOCARDIAL PERFUSION IMAGING   EKG 12-Lead   VAS US RENAL ARTERY DUPLEX     Disposition:   FU with after the above testing    Signed, Rollene Rotunda, MD  11/21/2022 12:29  PM    [facility]

## 2022-11-21 ENCOUNTER — Ambulatory Visit: Payer: Medicare Other | Attending: Cardiology | Admitting: Cardiology

## 2022-11-21 ENCOUNTER — Encounter: Payer: Self-pay | Admitting: Cardiology

## 2022-11-21 ENCOUNTER — Ambulatory Visit (INDEPENDENT_AMBULATORY_CARE_PROVIDER_SITE_OTHER): Payer: Medicare Other

## 2022-11-21 VITALS — BP 158/90 | HR 68 | Ht 64.0 in | Wt 87.8 lb

## 2022-11-21 DIAGNOSIS — Z8673 Personal history of transient ischemic attack (TIA), and cerebral infarction without residual deficits: Secondary | ICD-10-CM

## 2022-11-21 DIAGNOSIS — R002 Palpitations: Secondary | ICD-10-CM

## 2022-11-21 DIAGNOSIS — I10 Essential (primary) hypertension: Secondary | ICD-10-CM

## 2022-11-21 NOTE — Progress Notes (Unsigned)
Enrolled patient for a 3 day Zio XT monitor to be mailed to patients home  

## 2022-11-21 NOTE — Patient Instructions (Addendum)
Lab Work: Urinalysis today. If you have labs (blood work) drawn today and your tests are completely normal, you will receive your results only by: MyChart Message (if you have MyChart) OR A paper copy in the mail If you have any lab test that is abnormal or we need to change your treatment, we will call you to review the results.   Testing/Procedures: Your physician has requested that you have a lexiscan myoview. For further information please visit [email or web address] Please follow instruction sheet, as given.   Your physician has requested that you have a renal artery duplex. During this test, an ultrasound is used to evaluate blood flow to the kidneys. Allow one hour for this exam. Do not eat after midnight the day before and avoid carbonated beverages. Take your medications as you usually do.    ZIO XT- Long Term Monitor Instructions   Your physician has requested you wear your ZIO patch monitor___3____days.   This is a single patch monitor.  [employer] supplies one patch monitor per enrollment.  Additional stickers are not available.   Please do not apply patch if you will be having a Nuclear Stress Test, Echocardiogram, Cardiac CT, MRI, or Chest Xray during the time frame you would be wearing the monitor. The patch cannot be worn during these tests.  You cannot remove and re-apply the ZIO XT patch monitor.   Your ZIO patch monitor will be sent USPS Priority mail from [facility] directly to your home address. The monitor may also be mailed to a PO BOX if home delivery is not available.   It may take 3-5 days to receive your monitor after you have been enrolled.   Once you have received you monitor, please review enclosed instructions.  Your monitor has already been registered assigning a specific monitor serial # to you.   Applying the monitor   Shave hair from upper left chest.   Hold abrader disc by orange tab.  Rub abrader in 40 strokes over left upper chest as indicated  in your monitor instructions.   Clean area with 4 enclosed alcohol pads .  Use all pads to assure are is cleaned thoroughly.  Let dry.   Apply patch as indicated in monitor instructions.  Patch will be place under collarbone on left side of chest with arrow pointing upward.   Rub patch adhesive wings for 2 minutes.Remove white label marked "1".  Remove white label marked "2".  Rub patch adhesive wings for 2 additional minutes.   While looking in a mirror, press and release button in center of patch.  A small green light will flash 3-4 times .  This will be your only indicator the monitor has been turned on.     Do not shower for the first 24 hours.  You may shower after the first 24 hours.   Press button if you feel a symptom. You will hear a small click.  Record Date, Time and Symptom in the Patient Log Book.   When you are ready to remove patch, follow instructions on last 2 pages of Patient Log Book.  Stick patch monitor onto last page of Patient Log Book.   Place Patient Log Book in [place] box.  Use locking tab on box and tape box closed securely.  The [employer] has [employer] on it.  Please place in mailbox as soon as possible.  Your physician should have your test results approximately 7 days after the monitor has been mailed back  to [employer].   Call [facility] Customer Care at [phone] if you have questions regarding your ZIO XT patch monitor.  Call them immediately if you see an orange light blinking on your monitor.   If your monitor falls off in less than 4 days contact our Monitor department at [phone].  If your monitor becomes loose or falls off after 4 days call [employer] at [phone] for suggestions on securing your monitor.   Follow-Up: At [facility], you and your health needs are our priority.  As part of our continuing mission to provide you with exceptional heart care, we have created designated Provider Care Teams.  These Care  Teams include your primary Cardiologist (physician) and Advanced Practice Providers (APPs -  Physician Assistants and Nurse Practitioners) who all work together to provide you with the care you need, when you need it.  We recommend signing up for the patient portal called "MyChart".  Sign up information is provided on this After Visit Summary.  MyChart is used to connect with patients for Virtual Visits (Telemedicine).  Patients are able to view lab/test results, encounter notes, upcoming appointments, etc.  Non-urgent messages can be sent to your provider as well.   To learn more about what you can do with MyChart, go to [email or web address].    Your next appointment:   6 week(s)  Provider:   Rollene Rotunda MD

## 2022-11-22 LAB — URINALYSIS
Bilirubin, UA: NEGATIVE
Glucose, UA: NEGATIVE
Ketones, UA: NEGATIVE
Leukocytes,UA: NEGATIVE
Nitrite, UA: NEGATIVE
Protein,UA: NEGATIVE
RBC, UA: NEGATIVE
Specific Gravity, UA: 1.009 (ref 1.005–1.030)
Urobilinogen, Ur: 0.2 mg/dL (ref 0.2–1.0)
pH, UA: 8 — ABNORMAL HIGH (ref 5.0–7.5)

## 2022-11-25 ENCOUNTER — Encounter: Payer: Self-pay | Admitting: Family Medicine

## 2022-11-25 ENCOUNTER — Ambulatory Visit: Payer: Medicare Other | Admitting: Family Medicine

## 2022-11-25 VITALS — BP 131/61 | HR 81 | Temp 98.4°F | Wt 87.0 lb

## 2022-11-25 DIAGNOSIS — I10 Essential (primary) hypertension: Secondary | ICD-10-CM | POA: Diagnosis not present

## 2022-11-25 DIAGNOSIS — M2550 Pain in unspecified joint: Secondary | ICD-10-CM | POA: Insufficient documentation

## 2022-11-25 DIAGNOSIS — E785 Hyperlipidemia, unspecified: Secondary | ICD-10-CM | POA: Diagnosis not present

## 2022-11-25 DIAGNOSIS — M353 Polymyalgia rheumatica: Secondary | ICD-10-CM | POA: Diagnosis not present

## 2022-11-25 MED ORDER — HYDROCHLOROTHIAZIDE 25 MG PO TABS: 25.0000 mg | ORAL_TABLET | Freq: Every day | ORAL | 3 refills | Status: DC

## 2022-11-25 MED ORDER — LOSARTAN POTASSIUM 100 MG PO TABS: 100.0000 mg | ORAL_TABLET | Freq: Every day | ORAL | 1 refills | Status: DC

## 2022-11-25 MED ORDER — VERAPAMIL HCL ER 120 MG PO TBCR: 120.0000 mg | EXTENDED_RELEASE_TABLET | Freq: Every day | ORAL | 1 refills | Status: DC

## 2022-11-25 MED ORDER — ROSUVASTATIN CALCIUM 40 MG PO TABS: 40.0000 mg | ORAL_TABLET | Freq: Every day | ORAL | 3 refills | Status: DC

## 2022-11-25 NOTE — Patient Instructions (Addendum)

## 2022-11-25 NOTE — Progress Notes (Signed)
Wendy Jackson , [birth year], 77 y.o., female MRN: [id number] Patient Care Team    Relationship Specialty Notifications Start End  Natalia Leatherwood, DO PCP - General Family Medicine  12/11/20   Ihor Austin, NP Nurse Practitioner Neurology  05/23/20   Hulen Shouts, MD Referring Physician Ophthalmology  05/23/20   Dr. Lennon Alstrom Attending Physician Rheumatology  12/10/20    Comment: [place], [place]  Patton Salles, MD Consulting Physician Obstetrics and Gynecology  11/11/21     Chief Complaint  Patient presents with   Hypertension    cmc     Subjective: Wendy Jackson is a 77 y.o. Pt presents for Patient denies chest pain, shortness of breath, dizziness or lower extremity edema.   Hypertension/hyperlipidemia/lower extremity edema: Pt reports compliance with verapamil 120 mg daily, HCTZ 25 mg daily, losartan 100 mg daily and Lasix 10 mg as needed. Blood pressures ranges at home within normal limits. Patient denies chest pain, shortness of breath or lower extremity edema.  She is now established with cardiology.  Polyarthralgia: Patient has a history of rheumatological disease going back a few decades.  She had been prescribed low-dose steroid daily for some time and then switched over to Plaquenil.  She has a new rheumatologist now, and the diagnosis of lupus is in question.  They have continued the Plaquenil for now, with further discussion to be had a couple weeks.  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 Patient recently was seen by orthopedic for hip discomfort and given a prednisone taper.  She reports the swelling in her feet had also completely resolved along with other muscle skeletal complaints with use of steroid.     09/02/2022    8:42 AM 06/23/2022    2:30 PM 05/06/2022   12:51 PM 06/12/2021    1:56 PM 06/03/2021    1:41 PM  Depression screen PHQ 2/9  Decreased Interest 0 0 0 0 0  Down, Depressed, Hopeless 0 0 0 0 0  PHQ - 2  Score 0 0 0 0 0    Allergies  Allergen Reactions   Amlodipine Other (See Comments)    Bilateral lower extremity edema   Augmentin [Amoxicillin-Pot Clavulanate] Other (See Comments)    unk   Tramadol     Nausea    Flexeril [Cyclobenzaprine] Palpitations   Social History   Social History Narrative   Marital status/children/pets: Married.  2 children.   Education/employment: Retired.  College-educated.   Safety:      -smoke alarm in the home:Yes     - wears seatbelt: Yes     - Feels safe in their relationships: Yes      Past Medical History:  Diagnosis Date   Cataract 03/2019   Glaucoma Approximately 2005   Horseshoe retinal tear of left eye 04/02/2021   Hyperlipidemia LDL goal <70 01/11/2021   Hypertension 2022   Long-term use of Plaquenil 04/02/2021   Lupus    new rheum questioning original dx. continuing Plaquenil at lower dose for now.   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 bladder, acquired    Pseudophakia of both eyes 04/02/2021   Retinal detachment with multiple breaks, right eye 04/02/2021   Stroke (HCC) 04/2020   Thyroid disease    Past Surgical History:  Procedure Laterality Date   ENDARTERECTOMY Right 05/02/2020   Procedure: RIGHT CAROTID ENDARTERECTOMY;  Surgeon: Gretta Began  F, MD;  Location: [facility] OR;  Service: Vascular;  Laterality: Right;   EYE SURGERY  01/2020   SCLERAL BUCKLE Right 2004   TUBAL LIGATION  Feb 1982   Family History  Problem Relation Age of Onset   ALS Mother    Early death Mother    Dementia Father    Hypertension Father    Macular degeneration Maternal Aunt    Alcohol abuse Paternal Uncle    Alcohol abuse Paternal Uncle    Allergies as of 11/25/2022       Reactions   Amlodipine Other (See Comments)   Bilateral lower extremity edema   Augmentin [amoxicillin-pot Clavulanate] Other (See Comments)   unk   Tramadol    Nausea   Flexeril [cyclobenzaprine] Palpitations         Medication List        Accurate as of November 25, 2022  2:08 PM. If you have any questions, ask your nurse or doctor.          Airborne Elderberry 100-50 MG Chew Chew 1 tablet by mouth in the morning and at bedtime.   Armour Thyroid 30 MG tablet Generic drug: thyroid Take 1 tablet (30 mg total) by mouth daily.   aspirin EC 81 MG tablet Take 1 tablet (81 mg total) by mouth daily. Swallow whole.   cetirizine 10 MG chewable tablet Commonly known as: ZYRTEC Chew 10 mg by mouth daily.   estradiol 0.1 MG/GM vaginal cream Commonly known as: ESTRACE Use 1/2 gram vaginally every night for the first 2 weeks, then use 1/2 gram three times per week at night.  Do not use the vaginal estrogen cream for 5 nights prior to your procedure.   fluocinonide ointment 0.05 % Commonly known as: LIDEX Apply 1 Application topically 2 (two) times daily.   furosemide 40 MG tablet Commonly known as: LASIX Take 40 mg by mouth.   hydrochlorothiazide 25 MG tablet Commonly known as: HYDRODIURIL Take 1 tablet (25 mg total) by mouth daily.   hydroxychloroquine 200 MG tablet Commonly known as: PLAQUENIL Take 200 mg by mouth daily.   losartan 100 MG tablet Commonly known as: COZAAR Take 1 tablet (100 mg total) by mouth daily. What changed: additional instructions Changed by: Felix Pacini   Lumigan 0.01 % Soln Generic drug: bimatoprost Place 1 drop into the left eye at bedtime.   pantoprazole 40 MG tablet Commonly known as: PROTONIX Take 1 tablet (40 mg total) by mouth daily.   predniSONE 10 MG (21) Tbpk tablet Commonly known as: STERAPRED UNI-PAK 21 TAB Take 6,5,4,3,2,1 one tablet less each day with food   Resveratrol 50 MG Caps Take 50 mg by mouth daily.   rosuvastatin 40 MG tablet Commonly known as: Crestor Take 1 tablet (40 mg total) by mouth daily.   Simbrinza 1-0.2 % Susp Generic drug: Brinzolamide-Brimonidine SHAKE LIQUID AND INSTILL 1 DROP IN LEFT EYE THREE TIMES DAILY    tacrolimus 0.1 % ointment Commonly known as: PROTOPIC Apply topically 2 (two) times daily.   Ubiquinol 100 MG Caps Take 100 mg by mouth.   UP4 PROBIOTICS WOMENS PO Take 1 capsule by mouth daily.   verapamil 120 MG CR tablet Commonly known as: CALAN-SR Take 1 tablet (120 mg total) by mouth at bedtime.   Vitamin D-3 125 MCG (5000 UT) Tabs Take by mouth daily.        All past medical history, surgical history, allergies, family history, immunizations andmedications were updated in the EMR today and  reviewed under the history and medication portions of their EMR.     ROS Negative, with the exception of above mentioned in HPI   Objective:  BP 131/61   Pulse 81   Temp 98.4 F (36.9 C)   Wt 87 lb (39.5 kg)   SpO2 98%   BMI 14.93 kg/m  Body mass index is 14.93 kg/m. Physical Exam Vitals and nursing note reviewed.  Constitutional:      General: She is not in acute distress.    Appearance: Normal appearance. She is not ill-appearing, toxic-appearing or diaphoretic.  HENT:     Head: Normocephalic and atraumatic.  Eyes:     General: No scleral icterus.       Right eye: No discharge.        Left eye: No discharge.     Extraocular Movements: Extraocular movements intact.     Conjunctiva/sclera: Conjunctivae normal.     Pupils: Pupils are equal, round, and reactive to light.  Cardiovascular:     Rate and Rhythm: Normal rate and regular rhythm.     Heart sounds: No murmur heard.    Comments: Ectopic beats present today Pulmonary:     Effort: Pulmonary effort is normal. No respiratory distress.     Breath sounds: Normal breath sounds. No wheezing, rhonchi or rales.  Musculoskeletal:     Right lower leg: No edema.     Left lower leg: No edema.  Skin:    General: Skin is warm.     Findings: No rash.  Neurological:     Mental Status: She is alert and oriented to person, place, and time. Mental status is at baseline.     Motor: No weakness.     Gait: Gait normal.   Psychiatric:        Mood and Affect: Mood normal.        Behavior: Behavior normal.        Thought Content: Thought content normal.        Judgment: Judgment normal.     No results found. No results found. No results found for this or any previous visit (from the past 24 hour(s)).  Assessment/Plan: Wendy Jackson is a 77 y.o. female present for OV for Chronic Conditions/illness Management Primary hypertension/LE edema/HLD stable Continue  losartan 100 mg daily Continue hydrochlorothiazide 25 mg qd Will use lasix in place of hydrochlorothiazide if needed only for edema.  Continue verapamil 120 mg every day> higher doses caused lower extremity edema. Now established with cardio, Dr. Antoine Poche. Planning lexi-myoview, renal artery [place] and zio monitoring.  - Basic Metabolic Panel (BMET)  Polyarthralgia/PMR Patient is having continued generalized weakness, fatigue, myalgias and arthralgia.  She was prescribed Plaquenil by rheumatology for what was presumed to be lupus in the past.  There is some question if diagnosis is truly lupus versus PMR. Ironically the remainder of her swelling in her feet resolved with prednisone orthopedic for hip pain.  Is leading towards more of a PMR cause for her fatigue, weakness.  Will complete workup today and she has an appointment with the rheumatologist in a couple weeks. - Vitamin D (25 hydroxy) - B12 and Folate Panel - Sedimentation rate - ANA, IFA Comprehensive Panel-([facility]) - Cyclic citrul peptide antibody, IgG ([facility]) - Rheumatoid Factor   Reviewed expectations re: course of current medical issues. Discussed self-management of symptoms. Outlined signs and symptoms indicating need for more acute intervention. Patient verbalized understanding and all questions were answered. Patient received an After-Visit Summary.  Orders Placed This Encounter  Procedures   Vitamin D (25 hydroxy)   B12 and Folate Panel   Sedimentation rate   ANA, IFA  Comprehensive Panel-([facility])   Cyclic citrul peptide antibody, IgG ([facility])   Rheumatoid Factor   Basic Metabolic Panel (BMET)   Meds ordered this encounter  Medications   losartan (COZAAR) 100 MG tablet    Sig: Take 1 tablet (100 mg total) by mouth daily.    Dispense:  90 tablet    Refill:  1   rosuvastatin (CRESTOR) 40 MG tablet    Sig: Take 1 tablet (40 mg total) by mouth daily.    Dispense:  90 tablet    Refill:  3   verapamil (CALAN-SR) 120 MG CR tablet    Sig: Take 1 tablet (120 mg total) by mouth at bedtime.    Dispense:  90 tablet    Refill:  1   hydrochlorothiazide (HYDRODIURIL) 25 MG tablet    Sig: Take 1 tablet (25 mg total) by mouth daily.    Dispense:  90 tablet    Refill:  3   Referral Orders  No referral(s) requested today     Note is dictated utilizing voice recognition software. Although note has been proof read prior to signing, occasional typographical errors still can be missed. If any questions arise, please do not hesitate to call for verification.   electronically signed by:  Felix Pacini, DO  [facility] - OR

## 2022-11-26 ENCOUNTER — Telehealth: Payer: Self-pay

## 2022-11-26 LAB — BASIC METABOLIC PANEL
BUN: 16 mg/dL (ref 6–23)
CO2: 33 meq/L — ABNORMAL HIGH (ref 19–32)
Calcium: 9.6 mg/dL (ref 8.4–10.5)
Chloride: 92 meq/L — ABNORMAL LOW (ref 96–112)
Creatinine, Ser: 0.67 mg/dL (ref 0.40–1.20)
GFR: 85.2 mL/min (ref >60.00)
Glucose, Bld: 92 mg/dL (ref 70–99)
Potassium: 3.5 meq/L (ref 3.5–5.1)
Sodium: 135 meq/L (ref 135–145)

## 2022-11-26 LAB — SEDIMENTATION RATE: Sed Rate: 8 mm/h (ref 0–30)

## 2022-11-26 LAB — B12 AND FOLATE PANEL
Folate: >24.2 ng/mL (ref >5.9)
Vitamin B-12: >1537 pg/mL — ABNORMAL HIGH (ref 211–911)

## 2022-11-26 LAB — VITAMIN D 25 HYDROXY (VIT D DEFICIENCY, FRACTURES): VITD: >120 ng/mL

## 2022-11-26 NOTE — Telephone Encounter (Signed)
CRITICAL VALUE STICKER  CRITICAL VALUE: vit d > 120  RECEIVER (on-site recipient of call):Lexxie Winberg  DATE & TIME NOTIFIED: 11/26/22 @ 4:25 PM  MESSENGER (representative from lab): Cinda [facility]  MD NOTIFIED: [place]  TIME OF NOTIFICATION: 4:26PM  RESPONSE:  have pt stop taking vit d supplements   Called pt to advise

## 2022-11-27 LAB — CYCLIC CITRUL PEPTIDE ANTIBODY, IGG: Cyclic Citrullin Peptide Ab: <16 U

## 2022-11-27 LAB — ANA, IFA COMPREHENSIVE PANEL
Anti Nuclear Antibody (ANA): NEGATIVE
ENA SM Ab Ser-aCnc: <1 AI
SM/RNP: <1 AI
SSA (Ro) (ENA) Antibody, IgG: <1 AI
SSB (La) (ENA) Antibody, IgG: <1 AI
Scleroderma (Scl-70) (ENA) Antibody, IgG: <1 AI
ds DNA Ab: 1 [IU]/mL

## 2022-11-27 LAB — RHEUMATOID FACTOR: Rheumatoid fact SerPl-aCnc: <10 [IU]/mL (ref <14)

## 2022-11-28 ENCOUNTER — Ambulatory Visit: Payer: Medicare Other | Admitting: Rehabilitative and Restorative Service Providers"

## 2022-11-28 ENCOUNTER — Encounter: Payer: Self-pay | Admitting: Rehabilitative and Restorative Service Providers"

## 2022-11-28 DIAGNOSIS — M6281 Muscle weakness (generalized): Secondary | ICD-10-CM | POA: Diagnosis not present

## 2022-11-28 DIAGNOSIS — R002 Palpitations: Secondary | ICD-10-CM

## 2022-11-28 DIAGNOSIS — M25551 Pain in right hip: Secondary | ICD-10-CM

## 2022-11-28 DIAGNOSIS — M25651 Stiffness of right hip, not elsewhere classified: Secondary | ICD-10-CM | POA: Diagnosis not present

## 2022-11-28 DIAGNOSIS — I10 Essential (primary) hypertension: Secondary | ICD-10-CM | POA: Diagnosis not present

## 2022-11-28 DIAGNOSIS — R262 Difficulty in walking, not elsewhere classified: Secondary | ICD-10-CM

## 2022-11-28 NOTE — Therapy (Signed)
OUTPATIENT PHYSICAL THERAPY THORACOLUMBAR EVALUATION   Patient Name: Wendy Jackson MRN: [id number] DOB:06-22-1945, 77 y.o., female Today's Date: 11/28/2022  END OF SESSION:  PT End of Session - 11/28/22 1218     Visit Number 1    Number of Visits 12    Date for PT Re-Evaluation 01/23/23    PT Start Time 1016    PT Stop Time 1101    PT Time Calculation (min) 45 min    Activity Tolerance Patient tolerated treatment well;No increased pain    Behavior During Therapy [facility] for tasks assessed/performed             Past Medical History:  Diagnosis Date   Cataract 03/2019   Glaucoma Approximately 2005   Horseshoe retinal tear of left eye 04/02/2021   Hyperlipidemia LDL goal <70 01/11/2021   Hypertension 2022   Long-term use of Plaquenil 04/02/2021   Lupus    new rheum questioning original dx. continuing Plaquenil at lower dose for now.   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 bladder, acquired    Pseudophakia of both eyes 04/02/2021   Retinal detachment with multiple breaks, right eye 04/02/2021   Stroke (HCC) 04/2020   Thyroid disease    Past Surgical History:  Procedure Laterality Date   ENDARTERECTOMY Right 05/02/2020   Procedure: RIGHT CAROTID ENDARTERECTOMY;  Surgeon: Larina Earthly, MD;  Location[facility];  Service: Vascular;  Laterality: Right;   EYE SURGERY  01/2020   SCLERAL BUCKLE Right 2004   TUBAL LIGATION  Feb 1982   Patient Active Problem List   Diagnosis Date Noted   Polymyalgia rheumatica (HCC) 11/25/2022   Polyarthralgia 11/25/2022   Other secondary scoliosis, lumbar region 11/17/2022   Venous stasis dermatitis of both lower extremities 08/05/2022   Skin lesion 05/06/2022   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   Other female genital prolapse 10/29/2021   Immunization not carried out because of parent refusal 05/06/2021   Long-term use of Plaquenil 04/02/2021   Primary open angle glaucoma (POAG) of both eyes, severe stage  04/02/2021   Primary hypertension 01/11/2021   Hyperlipidemia LDL goal <70 01/11/2021   Acquired thrombophilia (HCC) 12/11/2020   BMI less than 19,adult 12/11/2020   Hypothyroidism 12/11/2020   History of CVA (cerebrovascular accident) 05/02/2020   Lupus    Allergic rhinitis     PCP: Natalia Leatherwood, DO  REFERRING PROVIDER: Eldred Manges, MD  REFERRING DIAG:  Diagnosis  M25.551 (ICD-10-CM) - Pain in right hip    Rationale for Evaluation and Treatment: Rehabilitation  THERAPY DIAG:  Difficulty in walking, not elsewhere classified - Plan: PT plan of care cert/re-cert  Muscle weakness (generalized) - Plan: PT plan of care cert/re-cert  Pain in right hip - Plan: PT plan of care cert/re-cert  Stiffness of right hip, not elsewhere classified - Plan: PT plan of care cert/re-cert  ONSET DATE: October 2024  SUBJECTIVE:  SUBJECTIVE STATEMENT: Liria was limping from her left knee for a few weeks.  About the time her left knee started feeling better, her right hip started to bother her.   She notes less pain since a steroid regimen (oral 6-1 dose pack).  She thinks her symptoms may also be related to polymyalgia rheumatica.    PERTINENT HISTORY:  HLD, HTN, Lupus?, Polymyalgia rheumatica, stroke, thyroid disease  PAIN:  Are you having pain? Yes: NPRS scale: 0-5/10 Pain location: Rt lateral hip, localized over the trochanter Pain description: Better with prednisone  Aggravating factors: Walking Relieving factors: Prednisone , up stairs  PRECAUTIONS: None  RED FLAGS: None   WEIGHT BEARING RESTRICTIONS: No  FALLS:  Has patient fallen in last 6 months? Yes. Number of falls 1  LIVING ENVIRONMENT: Lives with: lives with their family and lives with their spouse Lives in: House/apartment Stairs:   No problems Has following equipment at home: None  OCCUPATION: Retired  PLOF: Independent  PATIENT GOALS: Return to walking for exercise, do housework and normal activities  NEXT MD VISIT: NA  OBJECTIVE:  Note: Objective measures were completed at Evaluation unless otherwise noted.  DIAGNOSTIC FINDINGS:  AP pelvis including both hips and frog-leg right hip obtained and  reviewed.  This shows normal hip anatomy no degenerative changes no acute  changes.  Lumbar scoliosis again noted similar to February 2022 CT abdomen  and pelvis scout films.   Impression: Normal hip joints.  Lumbar scoliosis.  PATIENT SURVEYS:  FOTO 55 (risk-adjusted 49) Goal 65 in 12 visits  SCREENING FOR RED FLAGS: Bowel or bladder incontinence: No  COGNITION: Overall cognitive status: Within functional limits for tasks assessed     SENSATION: Some swelling related pain but no paresthesias  MUSCLE LENGTH: Hamstrings: Right 30 deg; Left 30 deg  POSTURE: "Hangs" on her Y ligaments a bit in standing  LUMBAR ROM:   AROM 11/28/2023  Flexion   Extension 20  Right lateral flexion   Left lateral flexion   Right rotation   Left rotation    (Blank rows = not tested)  LOWER EXTREMITY ROM:     Passive  Left/Right 11/28/2022   Hip flexion 115/115   Hip extension    Hip abduction    Hip adduction    Hip internal rotation 20/10   Hip external rotation 22/23   Knee flexion    Knee extension    Ankle dorsiflexion    Ankle plantarflexion    Ankle inversion    Ankle eversion     (Blank rows = not tested)  LOWER EXTREMITY MMT:    MMT out of 5 Left/Right 11/28/2022   Hip flexion 4-/4-   Hip extension    Hip abduction 4-/4-   Hip adduction    Hip internal rotation    Hip external rotation    Knee flexion    Knee extension 4/4   Ankle dorsiflexion    Ankle plantarflexion    Ankle inversion    Ankle eversion     (Blank rows = not tested)  GAIT: Distance walked: 30 feet Assistive device  utilized: None Level of assistance: Complete Independence Comments: Mild right lateral lean  TODAY'S TREATMENT:  DATE: 11/28/2022 Figure 4 stretch with push 2 x 20 seconds Hamstrings stretch 2 x 20 seconds Bridging 5 x 5 seconds Hip hike at counter top 10 x 3 seconds   Reviewed exam findings and home exercise program   PATIENT EDUCATION:  Education details: See above Person educated: Patient Education method: Explanation, Demonstration, Tactile cues, Verbal cues, and Handouts Education comprehension: verbalized understanding, returned demonstration, verbal cues required, tactile cues required, and needs further education  HOME EXERCISE PROGRAM: Access Code: [email or web address] URL: [email or web address] Date: 11/28/2022 Prepared by: Pauletta Browns  Exercises - Supine Figure 4 Piriformis Stretch  - 2-3 x daily - 7 x weekly - 1 sets - 5 reps - 20 seconds hold - Supine Hamstring Stretch  - 2-3 x daily - 7 x weekly - 1 sets - 5 reps - 20 seconds hold - Yoga Bridge  - 2-3 x daily - 7 x weekly - 1 sets - 10 reps - 5 seconds hold - Standing Hip Hiking  - 3-5 x daily - 7 x weekly - 1 sets - 10 reps - 3 seconds hold  ASSESSMENT:  CLINICAL IMPRESSION: Patient is a 77 y.o. female who was seen today for physical therapy evaluation and treatment for  Diagnosis  M25.551 (ICD-10-CM) - Pain in right hip  .  Leeasia's symptoms appear to be related to underlying stiffness and weakness that may have been flared up by overusing her right side after a few weeks of limping due to a left knee injury.  Her left hip is just as stiff as her right, although the right seems to be responsible for the overuse.  Her prognosis to meet the below listed goals is good with the recommended plan of care and adequate home exercise program participation  OBJECTIVE IMPAIRMENTS: cardiopulmonary  status limiting activity, decreased activity tolerance, decreased endurance, decreased knowledge of condition, difficulty walking, decreased ROM, decreased strength, and pain.   ACTIVITY LIMITATIONS: squatting and locomotion level  PARTICIPATION LIMITATIONS: cleaning and community activity  PERSONAL FACTORS: HLD, HTN, Lupus?, Polymyalgia rheumatica, stroke, thyroid disease are also affecting patient's functional outcome.   REHAB POTENTIAL: Good  CLINICAL DECISION MAKING: Stable/uncomplicated  EVALUATION COMPLEXITY: Low   GOALS: Goals reviewed with patient? Yes  SHORT TERM GOALS: Target date: 12/26/2022  Jermisha will be independent with her day 1 home exercise program Baseline: Started 11/28/2022 Goal status: INITIAL  2.  Improve bilateral hip external rotation active range of motion to 30 degrees and hamstrings to 40 degrees Baseline: 22/23 and 30/30 respectively Goal status: INITIAL  LONG TERM GOALS: Target date: 01/23/2023  FOTO scores at 65 or better in 12 visits or less Baseline: 55, risk-adjusted 49 Goal status: INITIAL  2.  Rakiah will report right hip pain consistently 0-1/10 on the numeric pain rating scale Baseline: As high as 5/10 with a steroid Dosepak Goal status: INITIAL  3.  Improve bilateral hip strength with particular attention on hip abductors Baseline: 4-/5 MMT bilateral Goal status: INITIAL  4.  Zenith will report being able to walk a mile or more without increasing right hip pain Baseline: Unable at evaluation Goal status: INITIAL  5.  Tequlia will be independent with her long-term maintenance home exercise program at discharge Baseline: Started 11/28/2022 Goal status: INITIAL   PLAN:  PT FREQUENCY: 1x/week  PT DURATION: 8 weeks  PLANNED INTERVENTIONS: 97110-Therapeutic exercises, 97530- Therapeutic activity, 97112- Neuromuscular re-education, 97535- Self Care, 16109- Manual therapy, [phone]- Gait training, Patient/Family education, Balance  training, Stair training, Dry Needling, Joint mobilization, Cryotherapy,  and Moist heat.  PLAN FOR NEXT SESSION: Review day 1 home exercise program and progress hip strength (particularly abductors) as appropriate   Cherlyn Cushing, PT, MPT 11/28/2022, 12:32 PM

## 2022-12-09 DIAGNOSIS — I10 Essential (primary) hypertension: Secondary | ICD-10-CM | POA: Diagnosis not present

## 2022-12-09 DIAGNOSIS — R002 Palpitations: Secondary | ICD-10-CM | POA: Diagnosis not present

## 2022-12-10 NOTE — Progress Notes (Unsigned)
GYNECOLOGY  VISIT   HPI: 77 y.o.   Married  Caucasian female   G2P0002 with No LMP recorded. Patient is postmenopausal.   here for: colpo for ASCUS pap with epithelial atypia.  Neg HR HPV.  Her husband is present for the entire visit today.   Used vaginal estrogen cream in preparation for the colposcopy.    Has possible plaque of the superior mesenteric artery.   GYNECOLOGIC HISTORY: No LMP recorded. Patient is postmenopausal. Contraception:  PMP Menopausal hormone therapy:  n/a Last 2 paps:  10/09/22 ASCUS, atrophic pattern with epithelial atypia, 11/11/21 ASCUS (atrophic pattern with epithelial atypia): HR HPV neg  History of abnormal Pap or positive HPV:  yes Mammogram:  11/13/22 Breast Density Cat C, BI-RADS CAT 1 neg        OB History     Gravida  2   Para      Term      Preterm      AB  0   Living  2      SAB  0   IAB      Ectopic  0   Multiple      Live Births                 Patient Active Problem List   Diagnosis Date Noted   Polymyalgia rheumatica (HCC) 11/25/2022   Polyarthralgia 11/25/2022   Other secondary scoliosis, lumbar region 11/17/2022   Venous stasis dermatitis of both lower extremities 08/05/2022   Skin lesion 05/06/2022   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   Other female genital prolapse 10/29/2021   Immunization not carried out because of parent refusal 05/06/2021   Long-term use of Plaquenil 04/02/2021   Primary open angle glaucoma (POAG) of both eyes, severe stage 04/02/2021   Primary hypertension 01/11/2021   Hyperlipidemia LDL goal <70 01/11/2021   Acquired thrombophilia (HCC) 12/11/2020   BMI less than 19,adult 12/11/2020   Hypothyroidism 12/11/2020   History of CVA (cerebrovascular accident) 05/02/2020   Lupus    Allergic rhinitis     Past Medical History:  Diagnosis Date   Cataract 03/2019   Glaucoma Approximately 2005   Horseshoe retinal tear of left eye 04/02/2021   Hyperlipidemia LDL goal <70 01/11/2021    Hypertension 2022   Long-term use of Plaquenil 04/02/2021   Lupus    new rheum questioning original dx. continuing Plaquenil at lower dose for now.   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 bladder, acquired    Pseudophakia of both eyes 04/02/2021   Retinal detachment with multiple breaks, right eye 04/02/2021   Stroke (HCC) 04/2020   Thyroid disease     Past Surgical History:  Procedure Laterality Date   ENDARTERECTOMY Right 05/02/2020   Procedure: RIGHT CAROTID ENDARTERECTOMY;  Surgeon: Larina Earthly, MD;  Location: [facility] OR;  Service: Vascular;  Laterality: Right;   EYE SURGERY  01/2020   SCLERAL BUCKLE Right 2004   TUBAL LIGATION  Feb 1982    Current Outpatient Medications  Medication Sig Dispense Refill   ARMOUR THYROID 30 MG tablet Take 1 tablet (30 mg total) by mouth daily. 90 tablet 4   aspirin EC 81 MG tablet Take 1 tablet (81 mg total) by mouth daily. Swallow whole. 30 tablet 11   cetirizine (ZYRTEC) 10 MG chewable tablet Chew 10 mg by mouth daily.     estradiol (ESTRACE) 0.1 MG/GM vaginal cream Use 1/2 gram vaginally every  night for the first 2 weeks, then use 1/2 gram three times per week at night.  Do not use the vaginal estrogen cream for 5 nights prior to your procedure. 42.5 g 1   fluocinonide ointment (LIDEX) 0.05 % Apply 1 Application topically 2 (two) times daily. 60 g 1   furosemide (LASIX) 40 MG tablet Take 40 mg by mouth.     hydrochlorothiazide (HYDRODIURIL) 25 MG tablet Take 1 tablet (25 mg total) by mouth daily. 90 tablet 3   hydroxychloroquine (PLAQUENIL) 200 MG tablet Take 200 mg by mouth daily.     losartan (COZAAR) 100 MG tablet Take 1 tablet (100 mg total) by mouth daily. 90 tablet 1   LUMIGAN 0.01 % SOLN Place 1 drop into the left eye at bedtime.     Misc Natural Products (AIRBORNE ELDERBERRY) CHEW Chew 1 tablet by mouth in the morning and at bedtime.     pantoprazole (PROTONIX) 40 MG tablet Take 1 tablet (40 mg  total) by mouth daily. 90 tablet 3   Probiotic Product (UP4 PROBIOTICS WOMENS PO) Take 1 capsule by mouth daily.     Resveratrol 50 MG CAPS Take 50 mg by mouth daily.     rosuvastatin (CRESTOR) 40 MG tablet Take 1 tablet (40 mg total) by mouth daily. 90 tablet 3   SIMBRINZA 1-0.2 % SUSP SHAKE LIQUID AND INSTILL 1 DROP IN LEFT EYE THREE TIMES DAILY     tacrolimus (PROTOPIC) 0.1 % ointment Apply topically 2 (two) times daily. 100 g 0   Ubiquinol 100 MG CAPS Take 100 mg by mouth.     verapamil (CALAN-SR) 120 MG CR tablet Take 1 tablet (120 mg total) by mouth at bedtime. 90 tablet 1   No current facility-administered medications for this visit.     ALLERGIES: Amlodipine, Augmentin [amoxicillin-pot clavulanate], Tramadol, and Flexeril [cyclobenzaprine]  Family History  Problem Relation Age of Onset   ALS Mother    Early death Mother    Dementia Father    Hypertension Father    Macular degeneration Maternal Aunt    Alcohol abuse Paternal Uncle    Alcohol abuse Paternal Uncle     Social History   Socioeconomic History   Marital status: Married    Spouse name: Peyton Najjar   Number of children: 2   Years of education: Not on file   Highest education level: Some college, no degree  Occupational History   Occupation: retired   Occupation: retired  Tobacco Use   Smoking status: Never    Passive exposure: Never   Smokeless tobacco: Never  Vaping Use   Vaping status: Never Used  Substance and Sexual Activity   Alcohol use: Never   Drug use: Never   Sexual activity: Not Currently    Birth control/protection: Surgical  Other Topics Concern   Not on file  Social History Narrative   Marital status/children/pets: Married.  2 children.   Education/employment: Retired.  College-educated.   Safety:      -smoke alarm in the home:Yes     - wears seatbelt: Yes     - Feels safe in their relationships: Yes      Social Drivers of Corporate investment banker Strain: Low Risk  (11/24/2022)    Overall Financial Resource Strain (CARDIA)    Difficulty of Paying Living Expenses: Not hard at all  Food Insecurity: No Food Insecurity (11/24/2022)   Hunger Vital Sign    Worried About Running Out of Food in the Last Year:  Never true    Ran Out of Food in the Last Year: Never true  Transportation Needs: No Transportation Needs (11/24/2022)   PRAPARE - Administrator, Civil Service (Medical): No    Lack of Transportation (Non-Medical): No  Physical Activity: Unknown (11/24/2022)   Exercise Vital Sign    Days of Exercise per Week: 0 days    Minutes of Exercise per Session: Patient declined  Stress: No Stress Concern Present (11/24/2022)   [employer] of Occupational Health - Occupational Stress Questionnaire    Feeling of Stress : Only a little  Social Connections: Unknown (11/24/2022)   Social Connection and Isolation Panel [NHANES]    Frequency of Communication with Friends and Family: Once a week    Frequency of Social Gatherings with Friends and Family: Patient declined    Attends Religious Services: Patient declined    Database administrator or Organizations: No    Attends Engineer, structural: Not on file    Marital Status: Married  Catering manager Violence: Not At Risk (06/23/2022)   Humiliation, Afraid, Rape, and Kick questionnaire    Fear of Current or Ex-Partner: No    Emotionally Abused: No    Physically Abused: No    Sexually Abused: No    Review of Systems  All other systems reviewed and are negative.   PHYSICAL EXAMINATION:   BP 128/76 (BP Location: Right Arm, Patient Position: Sitting, Cuff Size: Normal)   Ht 5\' 4"  (1.626 m)   Wt 87 lb (39.5 kg)   BMI 14.93 kg/m     General appearance: alert, cooperative and appears stated age  Pessary removed.    Colposcopy - cervix, vagina. Consent for procedure.  3% acetic acid used in vagina. White light and green light filter used.  Colposcopy satisfactory:  Yes   _x____          No     _____ Findings:    Cervix:  generalized atrophy noted.   Vagina:  generalized atrophy noted.  Biopsies:   ECC and biopsy at 4:00 (random selection).  Monsel's placed.  Minimal EBL. No complications.   Pessary cleansed and was replaced.    Chaperone was present for exam:  Warren Lacy, CMA  ASSESSMENT:  Pap with ASCUS and epithelial atypia.  Neg HR HPV.  Pelvic organ prolapse.  Pessary maintenance. Atrophy.  PLAN:  Abnormal pap, HPV, and atrophy discussed.  Follow up biopsies.  Continue vaginal estrogen cream.  Follow up in 3 months for pessary check.  Pessary care was provided in addition to the colposcopy and biopsies today.

## 2022-12-15 ENCOUNTER — Telehealth (HOSPITAL_COMMUNITY): Payer: Self-pay

## 2022-12-15 ENCOUNTER — Ambulatory Visit: Payer: Medicare Other | Admitting: Obstetrics and Gynecology

## 2022-12-15 NOTE — Telephone Encounter (Signed)
 Spoke with the patient, detailed instructions given. She stated that she would be here for her test. Asked to call back with any questions.  S.Jalyric Kaestner CCT

## 2022-12-22 ENCOUNTER — Ambulatory Visit (HOSPITAL_BASED_OUTPATIENT_CLINIC_OR_DEPARTMENT_OTHER): Payer: Medicare Other

## 2022-12-22 ENCOUNTER — Ambulatory Visit (HOSPITAL_COMMUNITY)
Admission: RE | Admit: 2022-12-22 | Discharge: 2022-12-22 | Disposition: A | Discharge status: Home / Self Care | Payer: Medicare Other | Source: Ambulatory Visit | Attending: Cardiovascular Disease | Admitting: Cardiovascular Disease

## 2022-12-22 DIAGNOSIS — I10 Essential (primary) hypertension: Secondary | ICD-10-CM | POA: Diagnosis not present

## 2022-12-22 DIAGNOSIS — Z8673 Personal history of transient ischemic attack (TIA), and cerebral infarction without residual deficits: Secondary | ICD-10-CM

## 2022-12-22 DIAGNOSIS — R002 Palpitations: Secondary | ICD-10-CM

## 2022-12-22 DIAGNOSIS — I2510 Atherosclerotic heart disease of native coronary artery without angina pectoris: Secondary | ICD-10-CM | POA: Insufficient documentation

## 2022-12-22 LAB — MYOCARDIAL PERFUSION IMAGING
LV dias vol: 54 mL (ref 46–106)
LV sys vol: 23 mL
Nuc Stress EF: 58 %
Peak HR: 96 {beats}/min
Rest HR: 76 {beats}/min
Rest Nuclear Isotope Dose: 10.9 mCi
SDS: 3
SRS: 3
SSS: 7
Stress Nuclear Isotope Dose: 30.1 mCi
TID: 0.98

## 2022-12-22 MED ORDER — TECHNETIUM TC 99M TETROFOSMIN IV KIT
30.1000 | PACK | Freq: Once | INTRAVENOUS | Status: AC | PRN
Start: 2022-12-22 — End: 2022-12-22
  Administered 2022-12-22: 30.1 via INTRAVENOUS

## 2022-12-22 MED ORDER — TECHNETIUM TC 99M TETROFOSMIN IV KIT
10.9000 | PACK | Freq: Once | INTRAVENOUS | Status: AC | PRN
  Administered 2022-12-22: 10.9 via INTRAVENOUS

## 2022-12-22 MED ORDER — REGADENOSON 0.4 MG/5ML IV SOLN
0.4000 mg | Freq: Once | INTRAVENOUS | Status: AC
  Administered 2022-12-22: 0.4 mg via INTRAVENOUS

## 2022-12-24 ENCOUNTER — Ambulatory Visit (INDEPENDENT_AMBULATORY_CARE_PROVIDER_SITE_OTHER): Payer: Medicare Other | Admitting: Obstetrics and Gynecology

## 2022-12-24 ENCOUNTER — Encounter: Payer: Self-pay | Admitting: Obstetrics and Gynecology

## 2022-12-24 ENCOUNTER — Telehealth: Payer: Self-pay

## 2022-12-24 ENCOUNTER — Other Ambulatory Visit (HOSPITAL_COMMUNITY)
Admission: RE | Admit: 2022-12-24 | Discharge: 2022-12-24 | Disposition: A | Discharge status: Home / Self Care | Payer: Medicare Other | Source: Ambulatory Visit | Attending: Obstetrics and Gynecology | Admitting: Obstetrics and Gynecology

## 2022-12-24 VITALS — BP 128/76 | Ht 64.0 in | Wt 87.0 lb

## 2022-12-24 DIAGNOSIS — N879 Dysplasia of cervix uteri, unspecified: Secondary | ICD-10-CM | POA: Diagnosis not present

## 2022-12-24 DIAGNOSIS — N952 Postmenopausal atrophic vaginitis: Secondary | ICD-10-CM

## 2022-12-24 DIAGNOSIS — Z4689 Encounter for fitting and adjustment of other specified devices: Secondary | ICD-10-CM | POA: Diagnosis not present

## 2022-12-24 DIAGNOSIS — R87619 Unspecified abnormal cytological findings in specimens from cervix uteri: Secondary | ICD-10-CM

## 2022-12-24 NOTE — Telephone Encounter (Signed)
Called patient and discussed the following renal duplex results. Confirmed with her that her appointment is not tomorrow but 12/19 at 10am.

## 2022-12-24 NOTE — Telephone Encounter (Signed)
-----   Message from Rollene Rotunda sent at 12/23/2022  8:51 PM EST ----- There is plaque in the superior mesenteric artery possibly but no evidence of renal artery stenosis.  Call Ms. Iqbal with the results and send results to [facility], Renee A, DO.  She has an appt with me tomorrow.

## 2022-12-25 ENCOUNTER — Encounter: Payer: Self-pay | Admitting: Rehabilitative and Restorative Service Providers"

## 2022-12-25 ENCOUNTER — Ambulatory Visit: Payer: Medicare Other | Admitting: Rehabilitative and Restorative Service Providers"

## 2022-12-25 DIAGNOSIS — M25551 Pain in right hip: Secondary | ICD-10-CM | POA: Diagnosis not present

## 2022-12-25 DIAGNOSIS — M25651 Stiffness of right hip, not elsewhere classified: Secondary | ICD-10-CM | POA: Diagnosis not present

## 2022-12-25 DIAGNOSIS — M6281 Muscle weakness (generalized): Secondary | ICD-10-CM

## 2022-12-25 DIAGNOSIS — R262 Difficulty in walking, not elsewhere classified: Secondary | ICD-10-CM | POA: Diagnosis not present

## 2022-12-25 NOTE — Therapy (Signed)
OUTPATIENT PHYSICAL THERAPY THORACOLUMBAR TREATMENT/DISCHARGE  PHYSICAL THERAPY DISCHARGE SUMMARY  Visits from Start of Care: 2  Current functional level related to goals / functional outcomes: See note   Remaining deficits: See note   Education / Equipment: Updated HEP   Patient agrees to discharge. Patient goals were met. Patient is being discharged due to being pleased with the current functional level.   Patient Name: Wendy Jackson MRN: [id number] DOB:1945/03/22, 77 y.o., female Today's Date: 12/25/2022  END OF SESSION:  PT End of Session - 12/25/22 0956     Visit Number 2    Number of Visits 12    Date for PT Re-Evaluation 01/23/23    PT Start Time 0956    PT Stop Time 1039    PT Time Calculation (min) 43 min    Activity Tolerance Patient tolerated treatment well;No increased pain    Behavior During Therapy [facility] for tasks assessed/performed              Past Medical History:  Diagnosis Date   Cataract 03/2019   Glaucoma Approximately 2005   Horseshoe retinal tear of left eye 04/02/2021   Hyperlipidemia LDL goal <70 01/11/2021   Hypertension 2022   Long-term use of Plaquenil 04/02/2021   Lupus    new rheum questioning original dx. continuing Plaquenil at lower dose for now.   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 bladder, acquired    Pseudophakia of both eyes 04/02/2021   Retinal detachment with multiple breaks, right eye 04/02/2021   Stroke (HCC) 04/2020   Thyroid disease    Past Surgical History:  Procedure Laterality Date   ENDARTERECTOMY Right 05/02/2020   Procedure: RIGHT CAROTID ENDARTERECTOMY;  Surgeon: Larina Earthly, MD;  Location: [facility] OR;  Service: Vascular;  Laterality: Right;   EYE SURGERY  01/2020   SCLERAL BUCKLE Right 2004   TUBAL LIGATION  Feb 1982   Patient Active Problem List   Diagnosis Date Noted   Polymyalgia rheumatica (HCC) 11/25/2022   Polyarthralgia 11/25/2022   Other secondary  scoliosis, lumbar region 11/17/2022   Venous stasis dermatitis of both lower extremities 08/05/2022   Skin lesion 05/06/2022   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   Other female genital prolapse 10/29/2021   Immunization not carried out because of parent refusal 05/06/2021   Long-term use of Plaquenil 04/02/2021   Primary open angle glaucoma (POAG) of both eyes, severe stage 04/02/2021   Primary hypertension 01/11/2021   Hyperlipidemia LDL goal <70 01/11/2021   Acquired thrombophilia (HCC) 12/11/2020   BMI less than 19,adult 12/11/2020   Hypothyroidism 12/11/2020   History of CVA (cerebrovascular accident) 05/02/2020   Lupus    Allergic rhinitis     PCP: Natalia Leatherwood, DO  REFERRING PROVIDER: Eldred Manges, MD  REFERRING DIAG:  Diagnosis  M25.551 (ICD-10-CM) - Pain in right hip    Rationale for Evaluation and Treatment: Rehabilitation  THERAPY DIAG:  Difficulty in walking, not elsewhere classified  Muscle weakness (generalized)  Pain in right hip  Stiffness of right hip, not elsewhere classified  ONSET DATE: October 2024  SUBJECTIVE:  SUBJECTIVE STATEMENT: Ahslee has been doing her normal stuff with pain no greater than 2/10 hip pain (does not affect function, more fatigue than pain).   Maryelizabeth was limping from her left knee for a few weeks.  About the time her left knee started feeling better, her right hip started to bother her.   She notes less pain since a steroid regimen (oral 6-1 dose pack).  She thinks her symptoms may also be related to polymyalgia rheumatica.   PERTINENT HISTORY:  HLD, HTN, Lupus?, Polymyalgia rheumatica, stroke, thyroid disease  PAIN:  Are you having pain? Yes: NPRS scale: 0-2/10, was 0-5/10 Pain location: Rt lateral hip, localized over the trochanter Pain  description: Better with prednisone  Aggravating factors: Walking Relieving factors: Prednisone , up stairs  PRECAUTIONS: None  RED FLAGS: None   WEIGHT BEARING RESTRICTIONS: No  FALLS:  Has patient fallen in last 6 months? Yes. Number of falls 1  LIVING ENVIRONMENT: Lives with: lives with their family and lives with their spouse Lives in: House/apartment Stairs:  No problems Has following equipment at home: None  OCCUPATION: Retired  PLOF: Independent  PATIENT GOALS: Return to walking for exercise, do housework and normal activities  NEXT MD VISIT: NA  OBJECTIVE:  Note: Objective measures were completed at Evaluation unless otherwise noted.  DIAGNOSTIC FINDINGS:  AP pelvis including both hips and frog-leg right hip obtained and  reviewed.  This shows normal hip anatomy no degenerative changes no acute  changes.  Lumbar scoliosis again noted similar to February 2022 CT abdomen  and pelvis scout films.   Impression: Normal hip joints.  Lumbar scoliosis.  PATIENT SURVEYS:  12/25/2022 FOTO 63  Eval FOTO 55 (risk-adjusted 49) Goal 65 in 12 visits  SCREENING FOR RED FLAGS: Bowel or bladder incontinence: No  COGNITION: Overall cognitive status: Within functional limits for tasks assessed     SENSATION: Some swelling related pain but no paresthesias  MUSCLE LENGTH: 12/25/2022: Hamstrings: Right 40 deg; Left 40 deg  Eval: Hamstrings: Right 30 deg; Left 30 deg  POSTURE: "Hangs" on her Y ligaments a bit in standing  LUMBAR ROM:   AROM 11/28/2023  Flexion   Extension 20  Right lateral flexion   Left lateral flexion   Right rotation   Left rotation    (Blank rows = not tested)  LOWER EXTREMITY ROM:     Passive  Left/Right 11/28/2022 Left/Right 12/25/2022  Hip flexion 115/115   Hip extension    Hip abduction    Hip adduction    Hip internal rotation 20/10   Hip external rotation 22/23 23/32  Knee flexion    Knee extension    Ankle dorsiflexion     Ankle plantarflexion    Ankle inversion    Ankle eversion     (Blank rows = not tested)  LOWER EXTREMITY MMT:    MMT out of 5 Left/Right 11/28/2022 Left/Right 12/25/2022  Hip flexion 4-/4- 4/4  Hip extension    Hip abduction 4-/4- 4/4  Hip adduction    Hip internal rotation    Hip external rotation    Knee flexion    Knee extension 4/4 4+/4+  Ankle dorsiflexion    Ankle plantarflexion    Ankle inversion    Ankle eversion     (Blank rows = not tested)  GAIT: Distance walked: 30 feet Assistive device utilized: None Level of assistance: Complete Independence Comments: Mild right lateral lean  TODAY'S TREATMENT:  DATE:  12/25/2022 Figure 4 stretch with push 5 x 20 seconds Hamstrings stretch 5 x 20 seconds Bridging 2 sets of 10 x 5 seconds Hip hike at counter top 10 x 3 seconds   Reviewed reassessment findings, long and home exercise program   11/28/2022 Figure 4 stretch with push 2 x 20 seconds Hamstrings stretch 2 x 20 seconds Bridging 5 x 5 seconds Hip hike at counter top 2 sets of 10 x 3 seconds   Reviewed exam findings and home exercise program   PATIENT EDUCATION:  Education details: See above Person educated: Patient Education method: Explanation, Demonstration, Tactile cues, Verbal cues, and Handouts Education comprehension: verbalized understanding, returned demonstration, verbal cues required, tactile cues required, and needs further education  HOME EXERCISE PROGRAM: Access Code: [email or web address] URL: [email or web address] Date: 12/25/2022 Prepared by: Pauletta Browns  Exercises - Supine Figure 4 Piriformis Stretch  - 1 x daily - 7 x weekly - 1 sets - 5 reps - 20 seconds hold - Supine Hamstring Stretch  - 1 x daily - 7 x weekly - 1 sets - 5 reps - 20 seconds hold - Yoga Bridge  - 1 x daily - 7 x weekly - 2 sets - 10 reps  - 5 seconds hold - Standing Hip Hiking  - 3-5 x daily - 7 x weekly - 1 sets - 10 reps - 3 seconds hold  ASSESSMENT:  CLINICAL IMPRESSION: Sarisha is making early progress with her objective AROM, strength and function with mostly independent rehabilitation.  She is on schedule to meet long-term goals and is confident with her day 1 HEP which is addressing her impairments.  We considered adding additional strength activities, but determined that compliance would be better with fewer activities.  Braylon is welcome to return for additional supervised PT if needed.  OBJECTIVE IMPAIRMENTS: cardiopulmonary status limiting activity, decreased activity tolerance, decreased endurance, decreased knowledge of condition, difficulty walking, decreased ROM, decreased strength, and pain.   ACTIVITY LIMITATIONS: squatting and locomotion level  PARTICIPATION LIMITATIONS: cleaning and community activity  PERSONAL FACTORS: HLD, HTN, Lupus?, Polymyalgia rheumatica, stroke, thyroid disease are also affecting patient's functional outcome.   REHAB POTENTIAL: Good  CLINICAL DECISION MAKING: Stable/uncomplicated  EVALUATION COMPLEXITY: Low   GOALS: Goals reviewed with patient? Yes  SHORT TERM GOALS: Target date: 12/26/2022  Khloee will be independent with her day 1 home exercise program Baseline: Started 11/28/2022 Goal status: Met 12/25/2022  2.  Improve bilateral hip external rotation active range of motion to 30 degrees and hamstrings to 40 degrees Baseline: 22/23 and 30/30 respectively Goal status: Partially Met  LONG TERM GOALS: Target date: 01/23/2023  FOTO scores at 65 or better in 12 visits or less Baseline: 55, risk-adjusted 49 Goal status: On Going 12/25/2022  2.  Noraida will report right hip pain consistently 0-1/10 on the numeric pain rating scale Baseline: As high as 5/10 with a steroid Dosepak Goal status: On Going 12/25/2022  3.  Improve bilateral hip strength with particular  attention on hip abductors Baseline: 4-/5 MMT bilateral Goal status: Met 12/25/2022  4.  Margretta will report being able to walk a mile or more without increasing right hip pain Baseline: Unable at evaluation Goal status: Partailly Met, fatigue vs pain 12/25/2022  5.  Dalari will be independent with her long-term maintenance home exercise program at discharge Baseline: Started 11/28/2022 Goal status: Met 12/25/2022   PLAN:  PT FREQUENCY: DC  PT DURATION: DC  PLANNED INTERVENTIONS: 97110-Therapeutic exercises, 97530- Therapeutic activity, 97112- Neuromuscular re-education,  16109- Self Care, 60454- Manual therapy, [phone]- Gait training, Patient/Family education, Balance training, Stair training, Dry Needling, Joint mobilization, Cryotherapy, and Moist heat.  PLAN FOR NEXT SESSION: DC   Cherlyn Cushing, PT, MPT 12/25/2022, 10:46 AM

## 2022-12-31 DIAGNOSIS — I471 Supraventricular tachycardia, unspecified: Secondary | ICD-10-CM | POA: Insufficient documentation

## 2022-12-31 DIAGNOSIS — R6889 Other general symptoms and signs: Secondary | ICD-10-CM | POA: Insufficient documentation

## 2022-12-31 DIAGNOSIS — R5383 Other fatigue: Secondary | ICD-10-CM | POA: Insufficient documentation

## 2022-12-31 NOTE — Progress Notes (Unsigned)
Cardiology Office Note:   Date:  01/01/2023  ID:  Wendy Jackson, DOB Nov 30, 1945, MRN [id number] PCP: Natalia Leatherwood, DO  [facility] Providers Cardiologist:  None {  History of Present Illness:   Wendy Jackson is a 77 y.o. female who presents for evaluation of fatigue and difficult to control hypertension.  She has a history of CVA 2022.  She has had PVD with with right carotid endarterectomy. A previous echocardiogram at that time which was fairly unremarkable.    She has had multiple manipulations blood pressure medicines trying to find something will control her blood pressure.  She reports it to be spiking but I was able to review the readings from her phone and her average blood pressures in the 131/61 range.  She does fluctuate from systolics to the mid to high 140s down to the 120s.  However, it seems to be relatively well-controlled on the current regimen of medicines.    With her decreased exercise tolerance I did send her for a PET perfusion study and this was low risk with a normal EF.   I sent her for renal Doppler and she had no stenosis although there is plaque in the SMA.  She also had fatigue and monitor demonstrated occasional SVT with the longest lasting 11 minutes.    Since I saw her she has continued to have fatigue.  She does feel her heart racing sometimes but really does not bother her.  She does not feel like she is having presyncope or syncope.  She does not have chest pressure, neck or arm discomfort.  She is not having any weight gain or edema.  Unfortunately she has not been taking her blood pressure routinely.  She is going to see a rheumatologist and they are wrestling with the diagnosis.  She was told at one point she had lupus but told that was probably not right.  They are looking for polymyalgia rheumatica.  Markers thus far have been negative.  She is seeing rheumatology.   ROS: As stated in the HPI and negative for all other systems.  Studies Reviewed:     EKG:   NA  Risk Assessment/Calculations:     Physical Exam:   VS:  BP (!) 183/65 (BP Location: Left Arm, Patient Position: Sitting, Cuff Size: Normal)   Pulse 83   Ht 5\' 4"  (1.626 m)   Wt 89 lb (40.4 kg)   SpO2 98%   BMI 15.28 kg/m    Wt Readings from Last 3 Encounters:  01/01/23 89 lb (40.4 kg)  12/24/22 87 lb (39.5 kg)  12/22/22 87 lb (39.5 kg)     GEN: Well nourished, well developed in no acute distress NECK: No JVD; No carotid bruits CARDIAC: RRR, no murmurs, rubs, gallops, positive ectopy RESPIRATORY:  Clear to auscultation without rales, wheezing or rhonchi  ABDOMEN: Soft, non-tender, non-distended EXTREMITIES:  No edema; No deformity   ASSESSMENT AND PLAN:   Carotid stenosis: She is followed by [facility] and needs continued risk reduction.   Decreased exercise tolerance:   I do not see a cardiac etiology and she has had an extensive workup.  No further workup at this point other than her rheumatologic workup.   Hypertension: Her blood pressure is elevated today but she says its only elevated in the doctor's office yet I have no data from home.  She is given written instructions for blood pressure diary.   Arrhythmia: She does have premature atrial contractions around 4%.  Her SVT  does persist for minutes at a time but she is not particularly symptomatic with this.  No change in therapy.   Fatigue: She is not anemic.  Thyroid has been okay and she is not anemic.  No further cardiac workup.     Follow up with the HTN clinic in Feb.   Signed, Rollene Rotunda, MD

## 2023-01-01 ENCOUNTER — Encounter: Payer: Self-pay | Admitting: Cardiology

## 2023-01-01 ENCOUNTER — Ambulatory Visit: Payer: Medicare Other | Attending: Cardiology | Admitting: Cardiology

## 2023-01-01 VITALS — BP 183/65 | HR 83 | Ht 64.0 in | Wt 89.0 lb

## 2023-01-01 DIAGNOSIS — I10 Essential (primary) hypertension: Secondary | ICD-10-CM | POA: Diagnosis not present

## 2023-01-01 DIAGNOSIS — I471 Supraventricular tachycardia, unspecified: Secondary | ICD-10-CM

## 2023-01-01 DIAGNOSIS — R5383 Other fatigue: Secondary | ICD-10-CM | POA: Diagnosis not present

## 2023-01-01 DIAGNOSIS — R6889 Other general symptoms and signs: Secondary | ICD-10-CM

## 2023-01-01 DIAGNOSIS — I6529 Occlusion and stenosis of unspecified carotid artery: Secondary | ICD-10-CM

## 2023-01-01 NOTE — Patient Instructions (Signed)
Medication Instructions:  No changes. *If you need a refill on your cardiac medications before your next appointment, please call your pharmacy*  Follow-Up: At [facility], you and your health needs are our priority.  As part of our continuing mission to provide you with exceptional heart care, we have created designated Provider Care Teams.  These Care Teams include your primary Cardiologist (physician) and Advanced Practice Providers (APPs -  Physician Assistants and Nurse Practitioners) who all work together to provide you with the care you need, when you need it.  We recommend signing up for the patient portal called "MyChart".  Sign up information is provided on this After Visit Summary.  MyChart is used to connect with patients for Virtual Visits (Telemedicine).  Patients are able to view lab/test results, encounter notes, upcoming appointments, etc.  Non-urgent messages can be sent to your provider as well.   To learn more about what you can do with MyChart, go to [email or web address].    Your next appointment:   As needed after Pharm D clinic visit   Provider:   Rollene Rotunda, MD     Other Instructions Take and record blood pressure 2x daily for 2-3 weeks and record reading on blood pressure log provided by nurse. Return to clinic for review.  Keep appointment with Pharm D in Feb 2025 for hyptertension.

## 2023-01-02 ENCOUNTER — Other Ambulatory Visit: Payer: Self-pay | Admitting: *Deleted

## 2023-01-02 DIAGNOSIS — R9431 Abnormal electrocardiogram [ECG] [EKG]: Secondary | ICD-10-CM

## 2023-01-09 LAB — SURGICAL PATHOLOGY

## 2023-01-14 DIAGNOSIS — S82141A Displaced bicondylar fracture of right tibia, initial encounter for closed fracture: Secondary | ICD-10-CM

## 2023-01-14 HISTORY — DX: Displaced bicondylar fracture of right tibia, initial encounter for closed fracture: S82.141A

## 2023-01-15 ENCOUNTER — Ambulatory Visit: Payer: Medicare Other | Admitting: Obstetrics and Gynecology

## 2023-01-15 DIAGNOSIS — M8589 Other specified disorders of bone density and structure, multiple sites: Secondary | ICD-10-CM | POA: Diagnosis not present

## 2023-01-15 DIAGNOSIS — M353 Polymyalgia rheumatica: Secondary | ICD-10-CM | POA: Diagnosis not present

## 2023-01-15 DIAGNOSIS — M5416 Radiculopathy, lumbar region: Secondary | ICD-10-CM | POA: Diagnosis not present

## 2023-01-15 DIAGNOSIS — R768 Other specified abnormal immunological findings in serum: Secondary | ICD-10-CM | POA: Diagnosis not present

## 2023-01-19 ENCOUNTER — Ambulatory Visit: Payer: Medicare Other | Admitting: Obstetrics and Gynecology

## 2023-01-26 ENCOUNTER — Telehealth (HOSPITAL_COMMUNITY): Payer: Self-pay | Admitting: Emergency Medicine

## 2023-01-26 ENCOUNTER — Ambulatory Visit: Payer: Medicare Other | Admitting: Gastroenterology

## 2023-01-26 ENCOUNTER — Encounter: Payer: Self-pay | Admitting: Gastroenterology

## 2023-01-26 VITALS — BP 130/70 | HR 79 | Ht 64.0 in | Wt 90.0 lb

## 2023-01-26 DIAGNOSIS — R140 Abdominal distension (gaseous): Secondary | ICD-10-CM

## 2023-01-26 DIAGNOSIS — R079 Chest pain, unspecified: Secondary | ICD-10-CM

## 2023-01-26 DIAGNOSIS — K2100 Gastro-esophageal reflux disease with esophagitis, without bleeding: Secondary | ICD-10-CM

## 2023-01-26 DIAGNOSIS — R159 Full incontinence of feces: Secondary | ICD-10-CM

## 2023-01-26 DIAGNOSIS — K581 Irritable bowel syndrome with constipation: Secondary | ICD-10-CM

## 2023-01-26 DIAGNOSIS — R152 Fecal urgency: Secondary | ICD-10-CM

## 2023-01-26 DIAGNOSIS — K219 Gastro-esophageal reflux disease without esophagitis: Secondary | ICD-10-CM

## 2023-01-26 DIAGNOSIS — N8110 Cystocele, unspecified: Secondary | ICD-10-CM

## 2023-01-26 DIAGNOSIS — R609 Edema, unspecified: Secondary | ICD-10-CM

## 2023-01-26 MED ORDER — FAMOTIDINE 20 MG PO TABS: 20.0000 mg | ORAL_TABLET | Freq: Every day | ORAL | 3 refills | Status: DC

## 2023-01-26 MED ORDER — METOPROLOL TARTRATE 50 MG PO TABS: 50.0000 mg | ORAL_TABLET | Freq: Once | ORAL | 0 refills | Status: DC

## 2023-01-26 NOTE — Telephone Encounter (Signed)
 Reaching out to patient to offer assistance regarding upcoming cardiac imaging study; pt verbalizes understanding of appt date/time, parking situation and where to check in, pre-test NPO status and medications ordered, and verified current allergies; name and call back number provided for further questions should they arise Rockwell Alexandria RN Navigator Cardiac Imaging [facility] [phone] office [phone] cell

## 2023-01-26 NOTE — Progress Notes (Signed)
 Wendy Jackson    [id number]    1945/03/03  Primary Care Physician:Kuneff, Charlies LABOR, DO  Referring Physician: Catherine Charlies LABOR, DO [address] [place],  [place] [address]   Chief complaint:  Abdominal bloating  Discussed the use of AI scribe software for clinical note transcription with the patient, who gave verbal consent to proceed.  History of Present Illness   71 yr very pleasant female here for visit accompanied by her husband, with a history of lupus and vaginal prolapse, presents with concerns about persistent bloating and feelings of fullness despite a low appetite. These symptoms have been ongoing for over a year and have not resolved. The patient also reports a recent weight loss of about 10 pounds, which occurred after starting a gluten-free diet.  The patient underwent a colonoscopy and endoscopy approximately a year ago due to these symptoms. The results showed acid reflux related damage in the lower esophagus but no precancerous changes. The patient was prescribed pantoprazole  before the procedure, but she discontinued it after several months due to a lack of noticeable improvement.  The patient also reports increased urinary leakage, which she believes has worsened since her last examination. She currently uses a pessary for her vaginal prolapse and has completed a course of physical therapy for pelvic floor dysfunction.  The patient has been experiencing edema in her legs, which has been an ongoing issue. She also reports bowel movements that vary in frequency, sometimes occurring after each meal, and sometimes not at all. The patient has not noticed any changes in stool consistency.  The patient has been trying to manage her symptoms with dietary changes, including a gluten-free diet and peppermint tea. She has not noticed a significant improvement in her symptoms with these changes.     EGD 03/17/2022 - Salmon- colored mucosa suspicious for Barrett' s esophagus.  Biopsied (reflux esophagitis, negative for Barrett's).  - Normal stomach. Biopsied (mild gastritis, negative for H. Pylori).  - Normal examined duodenum. Biopsied (negative for celiac).   Colonoscopy 03/17/22 - Altered vascular, erythematous and friable ( with contact bleeding) mucosa in the rectum. Biopsied (no inflammation, normal mucosa).  - One less than 1 mm polyp at the hepatic flexure, removed with a cold biopsy forceps. Resected and retrieved (tubular adenoma).  - The examination was otherwise normal on direct and retroflexion views.   Outpatient Encounter Medications as of 01/26/2023  Medication Sig   aspirin  EC 81 MG tablet Take 1 tablet (81 mg total) by mouth daily. Swallow whole.   cetirizine (ZYRTEC) 10 MG chewable tablet Chew 10 mg by mouth daily.   estradiol  (ESTRACE ) 0.1 MG/GM vaginal cream Use 1/2 gram vaginally every night for the first 2 weeks, then use 1/2 gram three times per week at night.  Do not use the vaginal estrogen cream for 5 nights prior to your procedure.   famotidine  (PEPCID ) 20 MG tablet Take 1 tablet (20 mg total) by mouth daily.   furosemide  (LASIX ) 40 MG tablet Take 40 mg by mouth.   hydrochlorothiazide  (HYDRODIURIL ) 25 MG tablet Take 1 tablet (25 mg total) by mouth daily.   hydroxychloroquine  (PLAQUENIL ) 200 MG tablet Take 200 mg by mouth daily.   latanoprost (XALATAN) 0.005 % ophthalmic solution 1 drop at bedtime.   Misc Natural Products (AIRBORNE ELDERBERRY) CHEW Chew 1 tablet by mouth in the morning and at bedtime.   Probiotic Product (UP4 PROBIOTICS WOMENS PO) Take 1 capsule by mouth daily.  Resveratrol 50 MG CAPS Take 50 mg by mouth daily.   rosuvastatin  (CRESTOR ) 40 MG tablet Take 1 tablet (40 mg total) by mouth daily.   SIMBRINZA 1-0.2 % SUSP SHAKE LIQUID AND INSTILL 1 DROP IN LEFT EYE THREE TIMES DAILY   Ubiquinol 100 MG CAPS Take 100 mg by mouth.   [DISCONTINUED] ARMOUR THYROID  30 MG tablet Take 1 tablet (30 mg total) by mouth daily.    [DISCONTINUED] losartan  (COZAAR ) 100 MG tablet Take 1 tablet (100 mg total) by mouth daily.   [DISCONTINUED] verapamil  (CALAN -SR) 120 MG CR tablet Take 1 tablet (120 mg total) by mouth at bedtime.   tacrolimus  (PROTOPIC ) 0.1 % ointment Apply topically 2 (two) times daily.   [DISCONTINUED] fluocinonide  ointment (LIDEX ) 0.05 % Apply 1 Application topically 2 (two) times daily.   [DISCONTINUED] LUMIGAN 0.01 % SOLN Place 1 drop into the left eye at bedtime.   [DISCONTINUED] pantoprazole  (PROTONIX ) 40 MG tablet Take 1 tablet (40 mg total) by mouth daily.   No facility-administered encounter medications on file as of 01/26/2023.    Allergies as of 01/26/2023 - Review Complete 01/26/2023  Allergen Reaction Noted   Amlodipine  Other (See Comments) 12/11/2020   Augmentin [amoxicillin-pot clavulanate] Other (See Comments) 08/04/2019   Tramadol  03/06/2020   Flexeril [cyclobenzaprine] Palpitations 08/04/2019    Past Medical History:  Diagnosis Date   Cataract 03/2019   Glaucoma Approximately 2005   Horseshoe retinal tear of left eye 04/02/2021   Hyperlipidemia LDL goal <70 01/11/2021   Hypertension 2022   Long-term use of Plaquenil  04/02/2021   Lupus    new rheum questioning original dx. continuing Plaquenil  at lower dose for now.   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 bladder, acquired    Pseudophakia of both eyes 04/02/2021   Retinal detachment with multiple breaks, right eye 04/02/2021   Stroke (HCC) 04/2020   Thyroid  disease     Past Surgical History:  Procedure Laterality Date   ENDARTERECTOMY Right 05/02/2020   Procedure: RIGHT CAROTID ENDARTERECTOMY;  Surgeon: Oris Krystal FALCON, MD;  Location[facility] OR;  Service: Vascular;  Laterality: Right;   EYE SURGERY  01/2020   SCLERAL BUCKLE Right 2004   TUBAL LIGATION  Feb 1982    Family History  Problem Relation Age of Onset   ALS Mother    Early death Mother    Dementia Father    Hypertension  Father    Macular degeneration Maternal Aunt    Alcohol abuse Paternal Uncle    Alcohol abuse Paternal Uncle     Social History   Socioeconomic History   Marital status: Married    Spouse name: Ubaldo   Number of children: 2   Years of education: Not on file   Highest education level: 12th grade  Occupational History   Occupation: retired   Occupation: retired  Tobacco Use   Smoking status: Never    Passive exposure: Never   Smokeless tobacco: Never  Vaping Use   Vaping status: Never Used  Substance and Sexual Activity   Alcohol use: Never   Drug use: Never   Sexual activity: Not Currently    Birth control/protection: Surgical  Other Topics Concern   Not on file  Social History Narrative   Marital status/children/pets: Married.  2 children.   Education/employment: Retired.  College-educated.   Safety:      -smoke alarm in the home:Yes     - wears seatbelt: Yes     -  Feels safe in their relationships: Yes      Social Drivers of Corporate Investment Banker Strain: Low Risk  (01/25/2023)   Overall Financial Resource Strain (CARDIA)    Difficulty of Paying Living Expenses: Not hard at all  Food Insecurity: No Food Insecurity (01/25/2023)   Hunger Vital Sign    Worried About Running Out of Food in the Last Year: Never true    Ran Out of Food in the Last Year: Never true  Transportation Needs: No Transportation Needs (01/25/2023)   PRAPARE - Administrator, Civil Service (Medical): No    Lack of Transportation (Non-Medical): No  Physical Activity: Unknown (01/25/2023)   Exercise Vital Sign    Days of Exercise per Week: 0 days    Minutes of Exercise per Session: Patient declined  Stress: No Stress Concern Present (01/25/2023)   [employer] of Occupational Health - Occupational Stress Questionnaire    Feeling of Stress : Only a little  Social Connections: Moderately Isolated (01/25/2023)   Social Connection and Isolation Panel [NHANES]    Frequency of  Communication with Friends and Family: Once a week    Frequency of Social Gatherings with Friends and Family: Never    Attends Religious Services: More than 4 times per year    Active Member of [employer] or Organizations: No    Attends Banker Meetings: Not on file    Marital Status: Married  Catering Manager Violence: Not At Risk (06/23/2022)   Humiliation, Afraid, Rape, and Kick questionnaire    Fear of Current or Ex-Partner: No    Emotionally Abused: No    Physically Abused: No    Sexually Abused: No      Review of systems: All other review of systems negative except as mentioned in the HPI.   Physical Exam: Vitals:   01/26/23 0935  BP: 130/70  Pulse: 79   Body mass index is 15.45 kg/m. Gen:      No acute distress HEENT:  sclera anicteric CV: s1s2 rrr, no murmur Lungs: B/l clear. Abd:      soft, non-tender; no palpable masses, no distension Ext:    No edema Neuro: alert and oriented x 3 Psych: normal mood and affect  Data Reviewed:  Reviewed labs, radiology imaging, old records and pertinent past GI work up   Assessment and Plan    Gastroesophageal Reflux Disease (GERD) History of endoscopy showing acid reflux related damage. No Barrett's esophagus on biopsy. Patient reports no symptomatic improvement with Pantoprazole . -Discontinue daily Pantoprazole , use as needed for heartburn,severe bloating. -Start low dose acid reducer like Pepcid  20mg  daily.  Chronic Bloating Ongoing for over a year. No significant improvement with gluten-free diet. Possible lactose intolerance or constipation related. -Trial of lactose-free diet for 1-2 weeks. -Add 1 teaspoon of Benefiber with meals 2-3 times a day. -Consider adding back some gluten to diet  -Trial of peppermint oil (Ibgard) up to 3 times a day.  Fecal Incontinence Reports occasional leakage, possibly related to vaginal prolapse and weakened pelvic floor. -Start Kegel exercises to improve sphincter  tone. -Referral for pelvic floor physical therapy.  Vaginal Prolapse Managed with pessary. History of pelvic floor physical therapy. -Consider additional physical therapy to strengthen rectal and vaginal muscles.  Edema Ongoing, possibly related to lupus. -No specific plan discussed in conversation.  Follow-up Schedule appointment with Physician Assistant to assess progress and determine if further interventions are needed.       This visit required >30 minutes  of patient care (this includes precharting, chart review, review of results, face-to-face time used for counseling as well as treatment plan and follow-up. The patient was provided an opportunity to ask questions and all were answered. The patient agreed with the plan and demonstrated an understanding of the instructions.  LOIS Wilkie Mcgee , MD    CC: Catherine Charlies LABOR, DO

## 2023-01-26 NOTE — Patient Instructions (Addendum)
 VISIT SUMMARY:  During today's visit, we discussed your ongoing symptoms of bloating, fullness, and recent weight loss. We reviewed your history of lupus, vaginal prolapse, and previous gastrointestinal evaluations. We also addressed your concerns about increased urinary leakage and leg edema.  YOUR PLAN:  -GASTROESOPHAGEAL REFLUX DISEASE (GERD): GERD is a condition where stomach acid frequently flows back into the tube connecting your mouth and stomach, causing irritation. We will discontinue your daily Pantoprazole  and switch to a low dose of Pepcid  daily. Use Pantoprazole  only as needed for severe bloating.  -CHRONIC BLOATING: Chronic bloating can be caused by various factors including diet and digestive issues. We will try a lactose-free diet for 1-2 weeks and add 1 teaspoon of Benefiber with meals 2-3 times a day.  You can also try peppermint oil (Ibogard) up to 3 times a day.  -FECAL INCONTINENCE: Fecal incontinence is the inability to control bowel movements, causing stool to leak unexpectedly. Start Kegel exercises to improve sphincter tone and we will refer you for pelvic floor physical therapy.  -VAGINAL PROLAPSE: Vaginal prolapse occurs when the muscles and tissues supporting the vagina weaken, causing it to slip out of place. You are currently managing this with a pessary and have completed physical therapy. We may consider additional physical therapy to strengthen your rectal and vaginal muscles.   INSTRUCTIONS:  Please schedule an appointment with the Physician Assistant to assess your progress and determine if further interventions are needed.  I appreciate the  opportunity to care for you  Thank You   Kavitha Nandigam , MD

## 2023-01-27 ENCOUNTER — Ambulatory Visit (HOSPITAL_BASED_OUTPATIENT_CLINIC_OR_DEPARTMENT_OTHER)
Admission: RE | Admit: 2023-01-27 | Discharge: 2023-01-27 | Disposition: A | Discharge status: Home / Self Care | Payer: Medicare Other | Source: Ambulatory Visit | Attending: Cardiovascular Disease

## 2023-01-27 ENCOUNTER — Other Ambulatory Visit: Payer: Self-pay | Admitting: Cardiovascular Disease

## 2023-01-27 ENCOUNTER — Ambulatory Visit (HOSPITAL_COMMUNITY)
Admission: RE | Admit: 2023-01-27 | Discharge: 2023-01-27 | Disposition: A | Discharge status: Home / Self Care | Payer: Medicare Other | Source: Ambulatory Visit | Attending: Cardiology | Admitting: Cardiology

## 2023-01-27 DIAGNOSIS — R931 Abnormal findings on diagnostic imaging of heart and coronary circulation: Secondary | ICD-10-CM | POA: Insufficient documentation

## 2023-01-27 DIAGNOSIS — R9431 Abnormal electrocardiogram [ECG] [EKG]: Secondary | ICD-10-CM | POA: Diagnosis not present

## 2023-01-27 DIAGNOSIS — I2510 Atherosclerotic heart disease of native coronary artery without angina pectoris: Secondary | ICD-10-CM

## 2023-01-27 DIAGNOSIS — Z0189 Encounter for other specified special examinations: Secondary | ICD-10-CM | POA: Diagnosis not present

## 2023-01-27 DIAGNOSIS — I517 Cardiomegaly: Secondary | ICD-10-CM | POA: Diagnosis not present

## 2023-01-27 MED ORDER — NITROGLYCERIN 0.4 MG SL SUBL
0.8000 mg | SUBLINGUAL_TABLET | Freq: Once | SUBLINGUAL | Status: AC
  Administered 2023-01-27: 0.8 mg via SUBLINGUAL

## 2023-01-27 MED ORDER — IOHEXOL 350 MG/ML SOLN
95.0000 mL | Freq: Once | INTRAVENOUS | Status: AC | PRN
  Administered 2023-01-27: 95 mL via INTRAVENOUS

## 2023-01-27 MED ORDER — NITROGLYCERIN 0.4 MG SL SUBL
SUBLINGUAL_TABLET | SUBLINGUAL | Status: AC
  Filled 2023-01-27: qty 2

## 2023-01-27 NOTE — Progress Notes (Signed)
 CT FFR ordered.  Gerri Spore T. Flora Lipps, MD, [facility]  [facility]  [address], [address] [place], [place] [address] [phone]  7:42 PM

## 2023-01-28 ENCOUNTER — Ambulatory Visit: Payer: Medicare Other | Admitting: Family Medicine

## 2023-01-28 NOTE — Progress Notes (Signed)
Wendy Jackson , 1945/03/22, 78 y.o., female MRN: [id number] Patient Care Team    Relationship Specialty Notifications Start End  Natalia Leatherwood, DO PCP - General Family Medicine  12/11/20   Rollene Rotunda, MD PCP - Cardiology Cardiology  01/01/23   Ihor Austin, NP Nurse Practitioner Neurology  05/23/20   Hulen Shouts, MD Referring Physician Ophthalmology  05/23/20   Dr. Lennon Alstrom Attending Physician Rheumatology  12/10/20    Comment: [place], [place]  Patton Salles, MD Consulting Physician Obstetrics and Gynecology  11/11/21     Chief Complaint  Patient presents with   discuss new Dx     Subjective: [name] is a 78 y.o. Pt presents for  Hypertension/hyperlipidemia/lower extremity edema: Pt reports compliance with verapamil 120 mg daily, HCTZ 25 mg daily, losartan 100 mg daily and Lasix 10 mg as needed. Blood pressures ranges at home within normal limits.  Patient denies chest pain, shortness of breath, dizziness or lower extremity edema.  She is now established with cardiology.  Polyarthralgia/poss PMR: Patient has a history of rheumatological disease going back a few decades.  She had been prescribed low-dose steroid daily for some time and then switched over to Plaquenil.  She has a new rheumatologist now, and the diagnosis of lupus is in question.  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 Patient recently was seen by orthopedic for hip discomfort and given a prednisone taper.  She reports the swelling in her feet had also completely resolved along with other muscle skeletal complaints with use of steroid. Today she would like a referral to a local rheumatologist to discuss their opinion on her rheumatological condition, evaluation and treatment     09/02/2022    8:42 AM 06/23/2022    2:30 PM 05/06/2022   12:51 PM 06/12/2021    1:56 PM 06/03/2021    1:41 PM  Depression screen PHQ 2/9  Decreased Interest 0 0 0 0 0  Down,  Depressed, Hopeless 0 0 0 0 0  PHQ - 2 Score 0 0 0 0 0    Allergies  Allergen Reactions   Amlodipine Other (See Comments)    Bilateral lower extremity edema   Augmentin [Amoxicillin-Pot Clavulanate] Other (See Comments)    unk   Tramadol     Nausea    Flexeril [Cyclobenzaprine] Palpitations   Social History   Social History Narrative   Marital status/children/pets: Married.  2 children.   Education/employment: Retired.  College-educated.   Safety:      -smoke alarm in the home:Yes     - wears seatbelt: Yes     - Feels safe in their relationships: Yes      Past Medical History:  Diagnosis Date   Cataract 03/2019   Glaucoma Approximately 2005   Horseshoe retinal tear of left eye 04/02/2021   Hyperlipidemia LDL goal <70 01/11/2021   Hypertension 2022   Long-term use of Plaquenil 04/02/2021   Lupus    new rheum questioning original dx. continuing Plaquenil at lower dose for now.   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 bladder, acquired    Pseudophakia of both eyes 04/02/2021   Retinal detachment with multiple breaks, right eye 04/02/2021   Stroke (HCC) 04/2020   Thyroid disease    Past Surgical History:  Procedure Laterality Date   ENDARTERECTOMY Right 05/02/2020   Procedure: RIGHT CAROTID ENDARTERECTOMY;  Surgeon: Larina Earthly, MD;  Location: [facility] OR;  Service: Vascular;  Laterality: Right;   EYE SURGERY  01/2020   SCLERAL BUCKLE Right 2004   TUBAL LIGATION  Feb 1982   Family History  Problem Relation Age of Onset   ALS Mother    Early death Mother    Dementia Father    Hypertension Father    Macular degeneration Maternal Aunt    Alcohol abuse Paternal Uncle    Alcohol abuse Paternal Uncle    Allergies as of 01/29/2023       Reactions   Amlodipine Other (See Comments)   Bilateral lower extremity edema   Augmentin [amoxicillin-pot Clavulanate] Other (See Comments)   unk   Tramadol    Nausea   Flexeril  [cyclobenzaprine] Palpitations        Medication List        Accurate as of January 29, 2023 11:46 AM. If you have any questions, ask your nurse or doctor.          STOP taking these medications    fluocinonide ointment 0.05 % Commonly known as: LIDEX Stopped by: Felix Pacini   Lumigan 0.01 % Soln Generic drug: bimatoprost Stopped by: Felix Pacini   metoprolol tartrate 50 MG tablet Commonly known as: LOPRESSOR Stopped by: Felix Pacini   pantoprazole 40 MG tablet Commonly known as: PROTONIX Stopped by: Felix Pacini       TAKE these medications    Airborne Elderberry 100-50 MG Chew Chew 1 tablet by mouth in the morning and at bedtime.   Armour Thyroid 30 MG tablet Generic drug: thyroid Take 1 tablet (30 mg total) by mouth daily.   aspirin EC 81 MG tablet Take 1 tablet (81 mg total) by mouth daily. Swallow whole.   cetirizine 10 MG chewable tablet Commonly known as: ZYRTEC Chew 10 mg by mouth daily.   estradiol 0.1 MG/GM vaginal cream Commonly known as: ESTRACE Use 1/2 gram vaginally every night for the first 2 weeks, then use 1/2 gram three times per week at night.  Do not use the vaginal estrogen cream for 5 nights prior to your procedure.   famotidine 20 MG tablet Commonly known as: Pepcid Take 1 tablet (20 mg total) by mouth daily.   furosemide 40 MG tablet Commonly known as: LASIX Take 40 mg by mouth.   hydrochlorothiazide 25 MG tablet Commonly known as: HYDRODIURIL Take 1 tablet (25 mg total) by mouth daily.   hydroxychloroquine 200 MG tablet Commonly known as: PLAQUENIL Take 200 mg by mouth daily.   latanoprost 0.005 % ophthalmic solution Commonly known as: XALATAN 1 drop at bedtime.   losartan 100 MG tablet Commonly known as: COZAAR Take 1 tablet (100 mg total) by mouth daily.   Resveratrol 50 MG Caps Take 50 mg by mouth daily.   rosuvastatin 40 MG tablet Commonly known as: Crestor Take 1 tablet (40 mg total) by mouth daily.    Simbrinza 1-0.2 % Susp Generic drug: Brinzolamide-Brimonidine SHAKE LIQUID AND INSTILL 1 DROP IN LEFT EYE THREE TIMES DAILY   tacrolimus 0.1 % ointment Commonly known as: PROTOPIC Apply topically 2 (two) times daily.   Ubiquinol 100 MG Caps Take 100 mg by mouth.   UP4 PROBIOTICS WOMENS PO Take 1 capsule by mouth daily.   verapamil 120 MG CR tablet Commonly known as: CALAN-SR Take 1 tablet (120 mg total) by mouth at bedtime.        All past medical history, surgical history, allergies, family history,  immunizations andmedications were updated in the EMR today and reviewed under the history and medication portions of their EMR.     ROS Negative, with the exception of above mentioned in HPI   Objective:  BP (!) 140/68   Pulse 61   Temp 97.8 F (36.6 C)   Wt 89 lb 12.8 oz (40.7 kg)   SpO2 98%   BMI 15.41 kg/m  Body mass index is 15.41 kg/m. Physical Exam Vitals and nursing note reviewed.  Constitutional:      General: She is not in acute distress.    Appearance: Normal appearance. She is not ill-appearing, toxic-appearing or diaphoretic.  HENT:     Head: Normocephalic and atraumatic.  Eyes:     General: No scleral icterus.       Right eye: No discharge.        Left eye: No discharge.     Extraocular Movements: Extraocular movements intact.     Conjunctiva/sclera: Conjunctivae normal.     Pupils: Pupils are equal, round, and reactive to light.  Cardiovascular:     Rate and Rhythm: Normal rate and regular rhythm.     Heart sounds: No murmur heard. Pulmonary:     Effort: Pulmonary effort is normal. No respiratory distress.     Breath sounds: Normal breath sounds. No wheezing, rhonchi or rales.  Musculoskeletal:     Right lower leg: No edema.     Left lower leg: No edema.  Skin:    General: Skin is warm.     Findings: No rash.  Neurological:     Mental Status: She is alert and oriented to person, place, and time. Mental status is at baseline.     Motor: No  weakness.     Gait: Gait normal.  Psychiatric:        Mood and Affect: Mood normal.        Behavior: Behavior normal.        Thought Content: Thought content normal.        Judgment: Judgment normal.     No results found. No results found.  No results found for this or any previous visit (from the past 24 hours).  Assessment/Plan: Oluwademilade Sande is a 78 y.o. female present for OV for Chronic Conditions/illness Management Primary hypertension/LE edema/HLD Stable Continue losartan 100 mg daily Continue hydrochlorothiazide 25 mg qd Will use lasix in place of hydrochlorothiazide if needed only for edema.  Continue verapamil 120 mg every day> higher doses caused lower extremity edema. Now established with cardio, Dr. Antoine Poche.  Coronary calcium score: 1525, 96th percentile> encouraged her to call her cardiologist next week if she has not heard the formal results prior.  Patient had good results on her MyChart and we covered basic questions on those results today. Continue Crestor 40 mg  Polyarthralgia/PMR Patient is having continued generalized weakness, fatigue, myalgias and arthralgia.  She was prescribed Plaquenil by rheumatology for what was presumed to be lupus in the past.  There is some question if diagnosis is truly lupus versus PMR. Ironically the remainder of her swelling in her feet resolved with prednisone orthopedics prescribed for hip pain.  Which could be leading more towards a PMR diagnoses. History of ANA positive per patient's prior rheumatologist Charise Killian rheumatology in [place]),.ANA completed here in November 2024 was negative with negative reflex panel.  Normal CCP.  Negative rheumatoid factor.  Normal sed rate.  Vitamin D and B12 not deficient. Referral to rheumatology in the [place] area placed  Reviewed expectations re: course of current medical issues. Discussed self-management of symptoms. Outlined signs and symptoms indicating need for more acute  intervention. Patient verbalized understanding and all questions were answered. Patient received an After-Visit Summary.    Orders Placed This Encounter  Procedures   Ambulatory referral to Rheumatology   Meds ordered this encounter  Medications   verapamil (CALAN-SR) 120 MG CR tablet    Sig: Take 1 tablet (120 mg total) by mouth at bedtime.    Dispense:  90 tablet    Refill:  1   losartan (COZAAR) 100 MG tablet    Sig: Take 1 tablet (100 mg total) by mouth daily.    Dispense:  90 tablet    Refill:  1   ARMOUR THYROID 30 MG tablet    Sig: Take 1 tablet (30 mg total) by mouth daily.    Dispense:  90 tablet    Refill:  4   Referral Orders         Ambulatory referral to Rheumatology       Note is dictated utilizing voice recognition software. Although note has been proof read prior to signing, occasional typographical errors still can be missed. If any questions arise, please do not hesitate to call for verification.   electronically signed by:  Felix Pacini, DO  [facility] - OR

## 2023-01-29 ENCOUNTER — Encounter: Payer: Self-pay | Admitting: Family Medicine

## 2023-01-29 ENCOUNTER — Encounter: Payer: Self-pay | Admitting: Gastroenterology

## 2023-01-29 ENCOUNTER — Ambulatory Visit (INDEPENDENT_AMBULATORY_CARE_PROVIDER_SITE_OTHER): Payer: Medicare Other | Admitting: Family Medicine

## 2023-01-29 VITALS — BP 140/68 | HR 61 | Temp 97.8°F | Wt 89.8 lb

## 2023-01-29 DIAGNOSIS — R931 Abnormal findings on diagnostic imaging of heart and coronary circulation: Secondary | ICD-10-CM | POA: Insufficient documentation

## 2023-01-29 DIAGNOSIS — E785 Hyperlipidemia, unspecified: Secondary | ICD-10-CM

## 2023-01-29 DIAGNOSIS — Z8673 Personal history of transient ischemic attack (TIA), and cerebral infarction without residual deficits: Secondary | ICD-10-CM | POA: Diagnosis not present

## 2023-01-29 DIAGNOSIS — M2550 Pain in unspecified joint: Secondary | ICD-10-CM

## 2023-01-29 DIAGNOSIS — I10 Essential (primary) hypertension: Secondary | ICD-10-CM

## 2023-01-29 DIAGNOSIS — Z79899 Other long term (current) drug therapy: Secondary | ICD-10-CM

## 2023-01-29 DIAGNOSIS — E039 Hypothyroidism, unspecified: Secondary | ICD-10-CM | POA: Diagnosis not present

## 2023-01-29 DIAGNOSIS — M353 Polymyalgia rheumatica: Secondary | ICD-10-CM

## 2023-01-29 MED ORDER — LOSARTAN POTASSIUM 100 MG PO TABS: 100.0000 mg | ORAL_TABLET | Freq: Every day | ORAL | 1 refills | Status: DC

## 2023-01-29 MED ORDER — ARMOUR THYROID 30 MG PO TABS: 30.0000 mg | ORAL_TABLET | Freq: Every day | ORAL | 4 refills | Status: DC

## 2023-01-29 MED ORDER — VERAPAMIL HCL ER 120 MG PO TBCR: 120.0000 mg | EXTENDED_RELEASE_TABLET | Freq: Every day | ORAL | 1 refills | Status: DC

## 2023-01-29 NOTE — Patient Instructions (Signed)

## 2023-01-30 ENCOUNTER — Encounter: Payer: Self-pay | Admitting: *Deleted

## 2023-01-30 ENCOUNTER — Telehealth: Payer: Self-pay | Admitting: Cardiology

## 2023-01-30 ENCOUNTER — Other Ambulatory Visit: Payer: Self-pay | Admitting: *Deleted

## 2023-01-30 DIAGNOSIS — R931 Abnormal findings on diagnostic imaging of heart and coronary circulation: Secondary | ICD-10-CM

## 2023-01-30 NOTE — Telephone Encounter (Signed)
Spoke with pt, cath scheduled for Tuesday 02/03/23 with dr Clifton James at 9 am. Instructions discussed with the patient and sent to patient via my chart. She will come by the office Monday 02/02/23 for lab work and ecg.

## 2023-01-30 NOTE — Telephone Encounter (Signed)
Pt is calling wanting someone to call her for her CT Results

## 2023-02-02 ENCOUNTER — Ambulatory Visit: Payer: Medicare Other | Attending: Cardiovascular Disease | Admitting: *Deleted

## 2023-02-02 VITALS — Ht 63.0 in | Wt 89.0 lb

## 2023-02-02 DIAGNOSIS — R931 Abnormal findings on diagnostic imaging of heart and coronary circulation: Secondary | ICD-10-CM

## 2023-02-02 DIAGNOSIS — R079 Chest pain, unspecified: Secondary | ICD-10-CM

## 2023-02-02 DIAGNOSIS — R9431 Abnormal electrocardiogram [ECG] [EKG]: Secondary | ICD-10-CM

## 2023-02-02 LAB — CBC
Hematocrit: 36.7 % (ref 34.0–46.6)
Hemoglobin: 12.6 g/dL (ref 11.1–15.9)
MCH: 31.4 pg (ref 26.6–33.0)
MCHC: 34.3 g/dL (ref 31.5–35.7)
MCV: 92 fL (ref 79–97)
Platelets: 206 10*3/uL (ref 150–450)
RBC: 4.01 x10E6/uL (ref 3.77–5.28)
RDW: 12.3 % (ref 11.7–15.4)
WBC: 6.5 10*3/uL (ref 3.4–10.8)

## 2023-02-02 LAB — BASIC METABOLIC PANEL
BUN/Creatinine Ratio: 27 (ref 12–28)
BUN: 18 mg/dL (ref 8–27)
CO2: 32 mmol/L — ABNORMAL HIGH (ref 20–29)
Calcium: 9.7 mg/dL (ref 8.7–10.3)
Chloride: 95 mmol/L — ABNORMAL LOW (ref 96–106)
Creatinine, Ser: 0.67 mg/dL (ref 0.57–1.00)
Glucose: 97 mg/dL (ref 70–99)
Potassium: 4 mmol/L (ref 3.5–5.2)
Sodium: 135 mmol/L (ref 134–144)
eGFR: 91 mL/min/{1.73_m2} (ref >59)

## 2023-02-02 NOTE — Progress Notes (Signed)
   Nurse Visit   Date of Encounter: 02/02/2023 ID: Wendy Jackson, DOB 01/07/1946, MRN [id number]  PCP:  Natalia Leatherwood, DO   [facility] Providers Cardiologist:  Rollene Rotunda, MD      Visit Details   VS:  Ht 5\' 3"  (1.6 m)   Wt 89 lb (40.4 kg)   BMI 15.77 kg/m  , BMI Body mass index is 15.77 kg/m.  Wt Readings from Last 3 Encounters:  02/02/23 89 lb (40.4 kg)  01/29/23 89 lb 12.8 oz (40.7 kg)  01/26/23 90 lb (40.8 kg)     Reason for visit: EKG Performed today: Vitals, EKG, Provider consulted:dr croitoru, and Education Changes (medications, testing, etc.) : none Length of Visit: 10 minutes    Medications Adjustments/Labs and Tests Ordered: Orders Placed This Encounter  Procedures   EKG 12-Lead   No orders of the defined types were placed in this encounter.    Signed, Deliah Goody, RN  02/02/2023 10:04 AM

## 2023-02-03 ENCOUNTER — Ambulatory Visit (HOSPITAL_COMMUNITY): Payer: Medicare Other

## 2023-02-03 ENCOUNTER — Ambulatory Visit (HOSPITAL_COMMUNITY)
Admission: RE | Admit: 2023-02-03 | Discharge: 2023-02-03 | Disposition: A | Discharge status: Home / Self Care | Payer: Medicare Other | Attending: Cardiovascular Disease | Admitting: Cardiovascular Disease

## 2023-02-03 ENCOUNTER — Encounter (HOSPITAL_COMMUNITY)
Admission: RE | Disposition: A | Discharge status: Home / Self Care | Payer: Self-pay | Source: Home / Self Care | Attending: Cardiovascular Disease

## 2023-02-03 ENCOUNTER — Other Ambulatory Visit: Payer: Self-pay

## 2023-02-03 DIAGNOSIS — I2510 Atherosclerotic heart disease of native coronary artery without angina pectoris: Secondary | ICD-10-CM | POA: Diagnosis not present

## 2023-02-03 DIAGNOSIS — I10 Essential (primary) hypertension: Secondary | ICD-10-CM | POA: Diagnosis not present

## 2023-02-03 DIAGNOSIS — I2584 Coronary atherosclerosis due to calcified coronary lesion: Secondary | ICD-10-CM | POA: Insufficient documentation

## 2023-02-03 DIAGNOSIS — Z8673 Personal history of transient ischemic attack (TIA), and cerebral infarction without residual deficits: Secondary | ICD-10-CM | POA: Insufficient documentation

## 2023-02-03 DIAGNOSIS — M329 Systemic lupus erythematosus, unspecified: Secondary | ICD-10-CM | POA: Insufficient documentation

## 2023-02-03 DIAGNOSIS — E785 Hyperlipidemia, unspecified: Secondary | ICD-10-CM | POA: Diagnosis not present

## 2023-02-03 DIAGNOSIS — R5383 Other fatigue: Secondary | ICD-10-CM | POA: Diagnosis not present

## 2023-02-03 DIAGNOSIS — R931 Abnormal findings on diagnostic imaging of heart and coronary circulation: Secondary | ICD-10-CM

## 2023-02-03 HISTORY — PX: LEFT HEART CATH AND CORONARY ANGIOGRAPHY: CATH118249

## 2023-02-03 LAB — ECHOCARDIOGRAM COMPLETE
AR max vel: 2.03 cm2
AV Area VTI: 2.06 cm2
AV Area mean vel: 2 cm2
AV Mean grad: 3 mm[Hg]
AV Peak grad: 4 mm[Hg]
Ao pk vel: 1 m/s
Area-P 1/2: 2.99 cm2
Height: 64 in
S' Lateral: 1.9 cm
Weight: 1424 [oz_av]

## 2023-02-03 SURGERY — LEFT HEART CATH AND CORONARY ANGIOGRAPHY
Anesthesia: LOCAL

## 2023-02-03 MED ORDER — FENTANYL CITRATE (PF) 100 MCG/2ML IJ SOLN
INTRAMUSCULAR | Status: DC | PRN
  Administered 2023-02-03: 25 ug via INTRAVENOUS

## 2023-02-03 MED ORDER — VERAPAMIL HCL 2.5 MG/ML IV SOLN
INTRAVENOUS | Status: DC | PRN
  Administered 2023-02-03: 10 mL via INTRA_ARTERIAL

## 2023-02-03 MED ORDER — HEPARIN (PORCINE) IN NACL 1000-0.9 UT/500ML-% IV SOLN
INTRAVENOUS | Status: DC | PRN
  Administered 2023-02-03 (×2): 500 mL

## 2023-02-03 MED ORDER — HEPARIN SODIUM (PORCINE) 1000 UNIT/ML IJ SOLN
INTRAMUSCULAR | Status: AC
  Filled 2023-02-03: qty 10

## 2023-02-03 MED ORDER — ONDANSETRON HCL 4 MG/2ML IJ SOLN: 4.0000 mg | Freq: Four times a day (QID) | INTRAMUSCULAR | Status: DC | PRN

## 2023-02-03 MED ORDER — HYDRALAZINE HCL 20 MG/ML IJ SOLN: 10.0000 mg | INTRAMUSCULAR | Status: DC | PRN

## 2023-02-03 MED ORDER — LABETALOL HCL 5 MG/ML IV SOLN: 10.0000 mg | INTRAVENOUS | Status: DC | PRN

## 2023-02-03 MED ORDER — SODIUM CHLORIDE 0.9 % IV SOLN
250.0000 mL | INTRAVENOUS | Status: DC | PRN
Start: 2023-02-03 — End: 2023-02-03

## 2023-02-03 MED ORDER — ACETAMINOPHEN 325 MG PO TABS: 650.0000 mg | ORAL_TABLET | ORAL | Status: DC | PRN

## 2023-02-03 MED ORDER — MIDAZOLAM HCL 2 MG/2ML IJ SOLN
INTRAMUSCULAR | Status: AC
  Filled 2023-02-03: qty 2

## 2023-02-03 MED ORDER — HEPARIN SODIUM (PORCINE) 1000 UNIT/ML IJ SOLN
INTRAMUSCULAR | Status: DC | PRN
  Administered 2023-02-03: 3000 [IU] via INTRAVENOUS

## 2023-02-03 MED ORDER — SODIUM CHLORIDE 0.9% FLUSH: 3.0000 mL | Freq: Two times a day (BID) | INTRAVENOUS | Status: DC

## 2023-02-03 MED ORDER — LIDOCAINE HCL (PF) 1 % IJ SOLN
INTRAMUSCULAR | Status: AC
Start: 2023-02-03 — End: ?
  Filled 2023-02-03: qty 30

## 2023-02-03 MED ORDER — FENTANYL CITRATE (PF) 100 MCG/2ML IJ SOLN
INTRAMUSCULAR | Status: AC
  Filled 2023-02-03: qty 2

## 2023-02-03 MED ORDER — SODIUM CHLORIDE 0.9 % WEIGHT BASED INFUSION: 1.0000 mL/kg/h | INTRAVENOUS | Status: DC

## 2023-02-03 MED ORDER — SODIUM CHLORIDE 0.9 % WEIGHT BASED INFUSION: 3.0000 mL/kg/h | INTRAVENOUS | Status: AC

## 2023-02-03 MED ORDER — IOHEXOL 350 MG/ML SOLN
INTRAVENOUS | Status: DC | PRN
  Administered 2023-02-03: 45 mL

## 2023-02-03 MED ORDER — VERAPAMIL HCL 2.5 MG/ML IV SOLN
INTRAVENOUS | Status: AC
  Filled 2023-02-03: qty 2

## 2023-02-03 MED ORDER — LIDOCAINE HCL (PF) 1 % IJ SOLN
INTRAMUSCULAR | Status: DC | PRN
  Administered 2023-02-03: 2 mL

## 2023-02-03 MED ORDER — ASPIRIN 81 MG PO CHEW: 81.0000 mg | CHEWABLE_TABLET | ORAL | Status: DC

## 2023-02-03 MED ORDER — SODIUM CHLORIDE 0.9% FLUSH: 3.0000 mL | INTRAVENOUS | Status: DC | PRN

## 2023-02-03 MED ORDER — SODIUM CHLORIDE 0.9 % IV SOLN: INTRAVENOUS | Status: AC

## 2023-02-03 MED ORDER — MIDAZOLAM HCL 2 MG/2ML IJ SOLN
INTRAMUSCULAR | Status: DC | PRN
  Administered 2023-02-03: 1 mg via INTRAVENOUS

## 2023-02-03 SURGICAL SUPPLY — 7 items
CATH 5FR JL3.5 JR4 ANG PIG MP (CATHETERS) IMPLANT
DEVICE RAD TR BAND REGULAR (VASCULAR PRODUCTS) IMPLANT
GLIDESHEATH SLEND SS 6F .021 (SHEATH) IMPLANT
GUIDEWIRE INQWIRE 1.5J.035X260 (WIRE) IMPLANT
INQWIRE 1.5J .035X260CM (WIRE) ×1
PACK CARDIAC CATHETERIZATION (CUSTOM PROCEDURE TRAY) ×1 IMPLANT
SET ATX-X65L (MISCELLANEOUS) IMPLANT

## 2023-02-03 NOTE — Interval H&P Note (Signed)
History and Physical Interval Note:  02/03/2023 9:04 AM  Wendy Jackson  has presented today for surgery, with the diagnosis of abnormal cta.  The various methods of treatment have been discussed with the patient and family. After consideration of risks, benefits and other options for treatment, the patient has consented to  Procedure(s): LEFT HEART CATH AND CORONARY ANGIOGRAPHY (N/A) as a surgical intervention.  The patient's history has been reviewed, patient examined, no change in status, stable for surgery.  I have reviewed the patient's chart and labs.  Questions were answered to the patient's satisfaction.    Cath Lab Visit (complete for each Cath Lab visit)  Clinical Evaluation Leading to the Procedure:   ACS: No.  Non-ACS:    Anginal Classification: CCS II  Anti-ischemic medical therapy: Minimal Therapy (1 class of medications)  Non-Invasive Test Results: High-risk stress test findings: cardiac mortality >3%/year (Cronary CTA with possible severe LAD and Circumflex stenosis)  Prior CABG: No previous CABG        Wendy Jackson

## 2023-02-03 NOTE — H&P (Signed)
Cardiology Admission History and Physical   Patient ID: Wendy Jackson MRN: [id number]; DOB: 1945-06-26   Admission date: 02/03/2023  PCP:  Natalia Leatherwood, DO   [facility] Providers Cardiologist:  Rollene Rotunda, MD        Chief Complaint:  CAD with fatigue  History of Present Illness:   Wendy Jackson is a 78 yo female with history of HTN, HLD, lupus, prior CVA with recent fatigue. Coronary CTA with possible obstructive plaque in the LAD and Circumflex. No chest pain or dyspnea.    Past Medical History:  Diagnosis Date   Cataract 03/2019   Glaucoma Approximately 2005   Horseshoe retinal tear of left eye 04/02/2021   Hyperlipidemia LDL goal <70 01/11/2021   Hypertension 2022   Long-term use of Plaquenil 04/02/2021   Lupus    new rheum questioning original dx. continuing Plaquenil at lower dose for now.   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 bladder, acquired    Pseudophakia of both eyes 04/02/2021   Retinal detachment with multiple breaks, right eye 04/02/2021   Stroke (HCC) 04/2020   Thyroid disease     Past Surgical History:  Procedure Laterality Date   ENDARTERECTOMY Right 05/02/2020   Procedure: RIGHT CAROTID ENDARTERECTOMY;  Surgeon: Larina Earthly, MD;  Location: [facility] OR;  Service: Vascular;  Laterality: Right;   EYE SURGERY  01/2020   SCLERAL BUCKLE Right 2004   TUBAL LIGATION  Feb 1982     Medications Prior to Admission: Prior to Admission medications   Medication Sig Start Date End Date Taking? Authorizing Provider  ARMOUR THYROID 30 MG tablet Take 1 tablet (30 mg total) by mouth daily. 01/29/23  Yes Kuneff, Renee A, DO  aspirin EC 81 MG tablet Take 1 tablet (81 mg total) by mouth daily. Swallow whole. 10/23/20  Yes McCue, Shanda Bumps, NP  cetirizine (ZYRTEC) 10 MG tablet Take 10 mg by mouth See admin instructions. Every 36 hours   Yes [provider]  estradiol (ESTRACE) 0.1 MG/GM vaginal cream Use 1/2  gram vaginally every night for the first 2 weeks, then use 1/2 gram three times per week at night.  Do not use the vaginal estrogen cream for 5 nights prior to your procedure. Patient taking differently: Place 1 Applicatorful vaginally 3 (three) times a week.  Do not use the vaginal estrogen cream for 5 nights prior to your procedure. 11/19/22  Yes Patton Salles, MD  hydrochlorothiazide (HYDRODIURIL) 25 MG tablet Take 1 tablet (25 mg total) by mouth daily. 11/25/22  Yes Kuneff, Renee A, DO  hydroxychloroquine (PLAQUENIL) 200 MG tablet Take 200 mg by mouth daily. 07/01/19  Yes [provider]  latanoprost (XALATAN) 0.005 % ophthalmic solution Place 1 drop into the left eye at bedtime.   Yes [provider]  losartan (COZAAR) 100 MG tablet Take 1 tablet (100 mg total) by mouth daily. 01/29/23  Yes Kuneff, Renee A, DO  Misc Natural Products (AIRBORNE ELDERBERRY) CHEW Chew 1 tablet by mouth in the morning and at bedtime.   Yes [provider]  Probiotic Product (UP4 PROBIOTICS WOMENS PO) Take 1 capsule by mouth daily.   Yes [provider]  Resveratrol 50 MG CAPS Take 50 mg by mouth daily.   Yes [provider]  rosuvastatin (CRESTOR) 40 MG tablet Take 1 tablet (40 mg total) by mouth daily. 11/25/22  Yes Kuneff, Renee A, DO  SIMBRINZA 1-0.2 %  SUSP Place 1 drop into the left eye 3 (three) times daily. 04/07/22  Yes [provider]  Sodium Chloride-Xylitol (XLEAR SINUS CARE SPRAY NA) Place 4 sprays into the nose daily as needed (Moisturing).   Yes [provider]  Ubiquinol 100 MG CAPS Take 100 mg by mouth.   Yes [provider]  verapamil (CALAN-SR) 120 MG CR tablet Take 1 tablet (120 mg total) by mouth at bedtime. 01/29/23  Yes Kuneff, Renee A, DO  famotidine (PEPCID) 20 MG tablet Take 1 tablet (20 mg total) by mouth daily. 01/26/23   Napoleon Form, MD  furosemide (LASIX) 40 MG tablet Take 40 mg by mouth daily as needed  for fluid or edema.    [provider]  tacrolimus (PROTOPIC) 0.1 % ointment Apply topically 2 (two) times daily. Patient taking differently: Apply 1 Application topically 2 (two) times daily as needed (Rash on leg). 07/28/22   Terri Piedra, DO     Allergies:    Allergies  Allergen Reactions   Amlodipine Other (See Comments)    Bilateral lower extremity edema   Augmentin [Amoxicillin-Pot Clavulanate] Other (See Comments)    unk   Tramadol     Nausea    Flexeril [Cyclobenzaprine] Palpitations    Social History:   Social History   Socioeconomic History   Marital status: Married    Spouse name: Peyton Najjar   Number of children: 2   Years of education: Not on file   Highest education level: 12th grade  Occupational History   Occupation: retired   Occupation: retired  Tobacco Use   Smoking status: Never    Passive exposure: Never   Smokeless tobacco: Never  Vaping Use   Vaping status: Never Used  Substance and Sexual Activity   Alcohol use: Never   Drug use: Never   Sexual activity: Not Currently    Birth control/protection: Surgical  Other Topics Concern   Not on file  Social History Narrative   Marital status/children/pets: Married.  2 children.   Education/employment: Retired.  College-educated.   Safety:      -smoke alarm in the home:Yes     - wears seatbelt: Yes     - Feels safe in their relationships: Yes      Social Drivers of Corporate investment banker Strain: Low Risk  (01/25/2023)   Overall Financial Resource Strain (CARDIA)    Difficulty of Paying Living Expenses: Not hard at all  Food Insecurity: No Food Insecurity (01/25/2023)   Hunger Vital Sign    Worried About Running Out of Food in the Last Year: Never true    Ran Out of Food in the Last Year: Never true  Transportation Needs: No Transportation Needs (01/25/2023)   PRAPARE - Administrator, Civil Service (Medical): No    Lack of Transportation (Non-Medical): No  Physical  Activity: Unknown (01/25/2023)   Exercise Vital Sign    Days of Exercise per Week: 0 days    Minutes of Exercise per Session: Patient declined  Stress: No Stress Concern Present (01/25/2023)   [employer] of Occupational Health - Occupational Stress Questionnaire    Feeling of Stress : Only a little  Social Connections: Moderately Isolated (01/25/2023)   Social Connection and Isolation Panel [NHANES]    Frequency of Communication with Friends and Family: Once a week    Frequency of Social Gatherings with Friends and Family: Never    Attends Religious Services: More than 4 times per year  Active Member of Clubs or Organizations: No    Attends Banker Meetings: Not on file    Marital Status: Married  Intimate Partner Violence: Not At Risk (06/23/2022)   Humiliation, Afraid, Rape, and Kick questionnaire    Fear of Current or Ex-Partner: No    Emotionally Abused: No    Physically Abused: No    Sexually Abused: No    Family History:   The patient's family history includes ALS in her mother; Alcohol abuse in her paternal uncle and paternal uncle; Dementia in her father; Early death in her mother; Hypertension in her father; Macular degeneration in her maternal aunt.    ROS:  Please see the history of present illness.  All other ROS reviewed and negative.     Physical Exam/Data:   Vitals:   02/03/23 0741  BP: (!) 160/64  Pulse: 64  Resp: 17  Temp: 97.8 F (36.6 C)  TempSrc: Oral  SpO2: 98%  Weight: 40.4 kg  Height: 5\' 4"  (1.626 m)   No intake or output data in the 24 hours ending 02/03/23 0902    02/03/2023    7:41 AM 02/02/2023   10:03 AM 01/29/2023   10:19 AM  Last 3 Weights  Weight (lbs) 89 lb 89 lb 89 lb 12.8 oz  Weight (kg) 40.37 kg 40.37 kg 40.733 kg     Body mass index is 15.28 kg/m.  General:  Well nourished, well developed, in no acute distress HEENT: normal Neck: no JVD Vascular: No carotid bruits; Distal pulses 2+ bilaterally   Cardiac:   normal S1, S2; RRR; no murmur  Lungs:  clear to auscultation bilaterally, no wheezing, rhonchi or rales  Abd: soft, nontender, no hepatomegaly  Ext: no LE edema Musculoskeletal:  No deformities, BUE and BLE strength normal and equal Skin: warm and dry  Neuro:  CNs 2-12 intact, no focal abnormalities noted Psych:  Normal affect   Laboratory Data:  High Sensitivity Troponin:  No results for input(s): "TROPONINIHS" in the last 720 hours.    Chemistry Recent Labs  Lab 02/02/23 1019  NA 135  K 4.0  CL 95*  CO2 32*  GLUCOSE 97  BUN 18  CREATININE 0.67  CALCIUM 9.7    No results for input(s): "PROT", "ALBUMIN", "AST", "ALT", "ALKPHOS", "BILITOT" in the last 168 hours. Lipids No results for input(s): "CHOL", "TRIG", "HDL", "LABVLDL", "LDLCALC", "CHOLHDL" in the last 168 hours. Hematology Recent Labs  Lab 02/02/23 1019  WBC 6.5  RBC 4.01  HGB 12.6  HCT 36.7  MCV 92  MCH 31.4  MCHC 34.3  RDW 12.3  PLT 206   Thyroid No results for input(s): "TSH", "FREET4" in the last 168 hours. BNPNo results for input(s): "BNP", "PROBNP" in the last 168 hours.  DDimer No results for input(s): "DDIMER" in the last 168 hours.   Radiology/Studies:  No results found.   Assessment and Plan:   CAD noted on CTA with fatigue. Cardiac cath today to define severity of disease.     For questions or updates, please contact [facility] Please consult [email or web address] for contact info under     Signed, [name], MD  02/03/2023 9:02 AM

## 2023-02-03 NOTE — Discharge Instructions (Signed)
Radial Site Care  This sheet gives you information about how to care for yourself after your procedure. Your health care provider may also give you more specific instructions. If you have problems or questions, contact your health care provider. What can I expect after the procedure? After the procedure, it is common to have: Bruising and tenderness at the catheter insertion area. Follow these instructions at home: Medicines Take over-the-counter and prescription medicines only as told by your health care provider. Insertion site care Follow instructions from your health care provider about how to take care of your insertion site. Make sure you: Wash your hands with soap and water before you remove your bandage (dressing). If soap and water are not available, use hand sanitizer. May remove dressing in 24 hours. Check your insertion site every day for signs of infection. Check for: Redness, swelling, or pain. Fluid or blood. Pus or a bad smell. Warmth. Do no take baths, swim, or use a hot tub for 5 days. You may shower 24-48 hours after the procedure. Remove the dressing and gently wash the site with plain soap and water. Pat the area dry with a clean towel. Do not rub the site. That could cause bleeding. Do not apply powder or lotion to the site. Activity  For 24 hours after the procedure, or as directed by your health care provider: Do not flex or bend the affected arm. Do not push or pull heavy objects with the affected arm. Do not drive yourself home from the hospital or clinic. You may drive 24 hours after the procedure. Do not operate machinery or power tools. KEEP ARM ELEVATED THE REMAINDER OF THE DAY. Do not push, pull or lift anything that is heavier than 10 lb for 5 days. Ask your health care provider when it is okay to: Return to work or school. Resume usual physical activities or sports. Resume sexual activity. General instructions If the catheter site starts to  bleed, raise your arm and put firm pressure on the site. If the bleeding does not stop, get help right away. This is a medical emergency. DRINK PLENTY OF FLUIDS FOR THE NEXT 2-3 DAYS. No alcohol consumption for 24 hours after receiving sedation. If you went home on the same day as your procedure, a responsible adult should be with you for the first 24 hours after you arrive home. Keep all follow-up visits as told by your health care provider. This is important. Contact a health care provider if: You have a fever. You have redness, swelling, or yellow drainage around your insertion site. Get help right away if: You have unusual pain at the radial site. The catheter insertion area swells very fast. The insertion area is bleeding, and the bleeding does not stop when you hold steady pressure on the area. Your arm or hand becomes pale, cool, tingly, or numb. These symptoms may represent a serious problem that is an emergency. Do not wait to see if the symptoms will go away. Get medical help right away. Call your local emergency services (911 in the [place] Do not drive yourself to the hospital. Summary After the procedure, it is common to have bruising and tenderness at the site. Follow instructions from your health care provider about how to take care of your radial site wound. Check the wound every day for signs of infection.  This information is not intended to replace advice given to you by your health care provider. Make sure you discuss any questions you have with   your health care provider. Document Revised: 02/04/2017 Document Reviewed: 02/04/2017 Elsevier Patient Education  2020 Elsevier Inc.  

## 2023-02-04 ENCOUNTER — Encounter (HOSPITAL_COMMUNITY): Payer: Self-pay | Admitting: Cardiovascular Disease

## 2023-02-13 ENCOUNTER — Encounter: Payer: Self-pay | Admitting: Thoracic Surgery (Cardiothoracic Vascular Surgery)

## 2023-02-13 ENCOUNTER — Institutional Professional Consult (permissible substitution): Payer: Medicare Other | Admitting: Thoracic Surgery (Cardiothoracic Vascular Surgery)

## 2023-02-13 VITALS — BP 210/85 | HR 74 | Resp 20 | Ht 64.0 in | Wt 90.2 lb

## 2023-02-13 DIAGNOSIS — I25119 Atherosclerotic heart disease of native coronary artery with unspecified angina pectoris: Secondary | ICD-10-CM

## 2023-02-13 NOTE — Progress Notes (Signed)
[address]       [place] [address]             [phone]        Wendy Jackson [facility] Medical Record #[id number] Date of Birth: 24-Aug-1945  Referring: Kathleene Hazel* Primary Care: Natalia Leatherwood, DO Primary Cardiologist:James Antoine Poche, MD  Chief Complaint:    Chief Complaint  Patient presents with   Coronary Artery Disease    New patient consultation, CATH 1/21, ECHO 1/21    History of Present Illness:     Wendy Jackson 78 y.o. female presents for surgical evaluation of presents for surgical evaluation of three-vessel coronary artery disease.  She originally was evaluated due to increased lower extremity swelling.  She also states that she has been having some low energy.  She denies any chest pain or shortness of breath.  She also had a stress test that was essentially was low risk.  Negative stress test    Past Medical History:  Diagnosis Date   Cataract 03/2019   Glaucoma Approximately 2005   Horseshoe retinal tear of left eye 04/02/2021   Hyperlipidemia LDL goal <70 01/11/2021   Hypertension 2022   Long-term use of Plaquenil 04/02/2021   Lupus    new rheum questioning original dx. continuing Plaquenil at lower dose for now.   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 bladder, acquired    Pseudophakia of both eyes 04/02/2021   Retinal detachment with multiple breaks, right eye 04/02/2021   Stroke (HCC) 04/2020   Thyroid disease     Past Surgical History:  Procedure Laterality Date   ENDARTERECTOMY Right 05/02/2020   Procedure: RIGHT CAROTID ENDARTERECTOMY;  Surgeon: Larina Earthly, MD;  Location: [facility] OR;  Service: Vascular;  Laterality: Right;   EYE SURGERY  01/2020   LEFT HEART CATH AND CORONARY ANGIOGRAPHY N/A 02/03/2023   Procedure: LEFT HEART CATH AND CORONARY ANGIOGRAPHY;  Surgeon: Kathleene Hazel, MD;  Location: [facility] INVASIVE CV LAB;  Service: Cardiovascular;  Laterality: N/A;    SCLERAL BUCKLE Right 2004   TUBAL LIGATION  Feb 1982    Social History:  Social History   Tobacco Use  Smoking Status Never   Passive exposure: Never  Smokeless Tobacco Never    Social History   Substance and Sexual Activity  Alcohol Use Never     Allergies  Allergen Reactions   Amlodipine Other (See Comments)    Bilateral lower extremity edema   Augmentin [Amoxicillin-Pot Clavulanate] Other (See Comments)    unk   Tramadol     Nausea    Flexeril [Cyclobenzaprine] Palpitations      Current Outpatient Medications  Medication Sig Dispense Refill   ARMOUR THYROID 30 MG tablet Take 1 tablet (30 mg total) by mouth daily. 90 tablet 4   aspirin EC 81 MG tablet Take 1 tablet (81 mg total) by mouth daily. Swallow whole. 30 tablet 11   cetirizine (ZYRTEC) 10 MG tablet Take 10 mg by mouth See admin instructions. Every 36 hours     estradiol (ESTRACE) 0.1 MG/GM vaginal cream Use 1/2 gram vaginally every night for the first 2 weeks, then use 1/2 gram three times per week at night.  Do not use the vaginal estrogen cream for 5 nights prior to your procedure. (Patient taking differently: Place 1 Applicatorful vaginally 3 (three) times a week.  Do not use the vaginal estrogen cream for  5 nights prior to your procedure.) 42.5 g 1   famotidine (PEPCID) 20 MG tablet Take 1 tablet (20 mg total) by mouth daily. 30 tablet 3   furosemide (LASIX) 40 MG tablet Take 40 mg by mouth daily as needed for fluid or edema.     hydrochlorothiazide (HYDRODIURIL) 25 MG tablet Take 1 tablet (25 mg total) by mouth daily. 90 tablet 3   hydroxychloroquine (PLAQUENIL) 200 MG tablet Take 200 mg by mouth daily.     latanoprost (XALATAN) 0.005 % ophthalmic solution Place 1 drop into the left eye at bedtime.     losartan (COZAAR) 100 MG tablet Take 1 tablet (100 mg total) by mouth daily. 90 tablet 1   Misc Natural Products (AIRBORNE ELDERBERRY) CHEW Chew 1 tablet by mouth in the morning and at bedtime.      Probiotic Product (UP4 PROBIOTICS WOMENS PO) Take 1 capsule by mouth daily.     Resveratrol 50 MG CAPS Take 50 mg by mouth daily.     rosuvastatin (CRESTOR) 40 MG tablet Take 1 tablet (40 mg total) by mouth daily. 90 tablet 3   SIMBRINZA 1-0.2 % SUSP Place 1 drop into the left eye 3 (three) times daily.     Sodium Chloride-Xylitol (XLEAR SINUS CARE SPRAY NA) Place 4 sprays into the nose daily as needed (Moisturing).     tacrolimus (PROTOPIC) 0.1 % ointment Apply topically 2 (two) times daily. (Patient taking differently: Apply 1 Application topically 2 (two) times daily as needed (Rash on leg).) 100 g 0   Ubiquinol 100 MG CAPS Take 100 mg by mouth.     verapamil (CALAN-SR) 120 MG CR tablet Take 1 tablet (120 mg total) by mouth at bedtime. 90 tablet 1   No current facility-administered medications for this visit.    (Not in a hospital admission)   Family History  Problem Relation Age of Onset   ALS Mother    Early death Mother    Dementia Father    Hypertension Father    Macular degeneration Maternal Aunt    Alcohol abuse Paternal Uncle    Alcohol abuse Paternal Uncle      Review of Systems:   Review of Systems  Constitutional:  Positive for malaise/fatigue.  Respiratory:  Negative for shortness of breath.   Cardiovascular:  Negative for chest pain and palpitations.  Neurological: Negative.       Physical Exam: BP (!) 210/85 (BP Location: Left Arm, Patient Position: Sitting, Cuff Size: Small)   Pulse 74   Resp 20   Ht 5\' 4"  (1.626 m)   Wt 90 lb 3.2 oz (40.9 kg)   SpO2 96% Comment: RA  BMI 15.48 kg/m  Physical Exam Constitutional:      General: She is not in acute distress.    Appearance: Normal appearance. She is not ill-appearing.  HENT:     Head: Normocephalic and atraumatic.  Eyes:     Extraocular Movements: Extraocular movements intact.  Cardiovascular:     Rate and Rhythm: Normal rate.  Pulmonary:     Effort: Pulmonary effort is normal. No respiratory  distress.  Abdominal:     General: Abdomen is flat. There is no distension.  Musculoskeletal:        General: Normal range of motion.     Cervical back: Normal range of motion.  Skin:    General: Skin is warm and dry.  Neurological:     General: No focal deficit present.     Mental Status:  She is alert and oriented to person, place, and time.       Diagnostic Studies & Laboratory data:    Left Heart Catherization:  Intervention Echo: IMPRESSIONS     1. Left ventricular ejection fraction, by estimation, is 55 to 60%. The  left ventricle has normal function. The left ventricle has no regional  wall motion abnormalities. Left ventricular diastolic parameters are  consistent with Grade I diastolic  dysfunction (impaired relaxation). Elevated left atrial pressure.   2. Right ventricular systolic function is normal. The right ventricular  size is normal. There is normal pulmonary artery systolic pressure. The  estimated right ventricular systolic pressure is 24.2 mmHg.   3. The mitral valve is normal in structure. No evidence of mitral valve  regurgitation. No evidence of mitral stenosis.   4. The aortic valve is tricuspid. Aortic valve regurgitation is not  visualized. Aortic valve sclerosis/calcification is present, without any  evidence of aortic stenosis.   5. The inferior vena cava is normal in size with greater than 50%  respiratory variability, suggesting right atrial pressure of 3 mmHg.     EKG: Sinus with PAC I have independently reviewed the above radiologic studies and discussed with the patient   Recent Lab Findings: Lab Results  Component Value Date   WBC 6.5 02/02/2023   HGB 12.6 02/02/2023   HCT 36.7 02/02/2023   PLT 206 02/02/2023   GLUCOSE 97 02/02/2023   CHOL 161 05/06/2021   TRIG 69.0 05/06/2021   HDL 91.40 05/06/2021   LDLDIRECT 55 05/06/2022   LDLCALC 56 05/06/2021   ALT 27 05/06/2022   AST 30 05/06/2022   NA 135 02/02/2023   K 4.0 02/02/2023    CL 95 (L) 02/02/2023   CREATININE 0.67 02/02/2023   BUN 18 02/02/2023   CO2 32 (H) 02/02/2023   TSH 1.72 05/06/2022   INR 1.0 05/02/2020   HGBA1C 5.7 05/06/2021      Assessment / Plan:   78 year old female with three-vessel coronary artery disease.  She has preserved biventricular function on echocardiogram with no significant valvular disease.  From cardiac standpoint she has no symptoms.  She does have a history of lupus and has been on calcium channel blockers which could explain her fatigue, and lower extremity swelling.  I will discuss her case in cath conference, but based on the ischemia trial data I would lean towards medical management.   I  spent 40 minutes counseling the patient face to face.   Eliezer Khawaja O Lincy Belles 02/13/2023 11:00 AM

## 2023-02-16 ENCOUNTER — Ambulatory Visit: Payer: Self-pay | Admitting: Emergency Medicine

## 2023-02-16 NOTE — Heart Team MDD (Signed)
   Heart Team Multi-Disciplinary Discussion  Patient: Wendy Jackson  DOB: 1946-01-10  MRN: [id number]   Date: 02/16/2023  10:32 AM    Attendees: Interventional Cardiology: Nanetta Batty, MD Lorine Bears, MD Alverda Skeans, MD Bryan Lemma, MD Yates Decamp, MD Truett Mainland, MD Peter Swaziland, MD Yvonne Kendall, MD  Cardiothoracic Surgery: Brynda Greathouse, MD  Advanced Heart Failure: Marca Ancona, MD    Patient History: 78 yo female with recent fatigue. Recent coronary CTA with possible obstructive plaque in the LAD and Circumflex. No chest pain or dyspnea described.    Risk Factors: Hypertension Hyperlipidemia History of Stroke or TIA Additional Risk Factors: lupus     Review of Prior Angiography and PCI Procedures: The Left Heart Cath and Coronary Angiography images from 02/03/23 were reviewed and discussed in detail including: Severe triple vessel CAD with complex, heavily calcified stenosis in the mid LAD involving a moderate caliber diagonal branch that has ostial/proximal disease.  Severe proximal Circumflex stenosis. Moderately severe, calcified proximal RCA stenosis. Severe ostial PDA stenosis.     Discussion: After presentation, consideration of treatment options occurred including CT surgery versus PCI versus medical therapy. Heavy consideration was given to the fact that patient is asymptomatic. After thorough discussion, the consensus of the team was for medical therapy.   Recommendations: Medical Therapy      Alison Murray, RN  02/16/2023 10:32 AM

## 2023-02-20 ENCOUNTER — Ambulatory Visit: Payer: Medicare Other | Admitting: Thoracic Surgery (Cardiothoracic Vascular Surgery)

## 2023-02-20 DIAGNOSIS — I25119 Atherosclerotic heart disease of native coronary artery with unspecified angina pectoris: Secondary | ICD-10-CM

## 2023-02-20 NOTE — Progress Notes (Signed)
     [address]       [place] [address]             [phone]       Patient: Home Provider: Office Consent for Telemedicine visit obtained.  Today's visit was completed via a real-time telehealth (see specific modality noted below). The patient/authorized person provided oral consent at the time of the visit to engage in a telemedicine encounter with the present provider at [facility]. The patient/authorized person was informed of the potential benefits, limitations, and risks of telemedicine. The patient/authorized person expressed understanding that the laws that protect confidentiality also apply to telemedicine. The patient/authorized person acknowledged understanding that telemedicine does not provide emergency services and that he or she would need to call 911 or proceed to the nearest hospital for help if such a need arose.   Total time spent in the clinical discussion 10 minutes.  Telehealth Modality: Phone visit (audio only)  I had a telephone visit with Mrs. Douglass.  I relayed the discussion from Cath conference.  We will plan to to continue with medical therapy.  She will follow-up as needed.  Trust Crago MALVA Rayas

## 2023-02-24 ENCOUNTER — Encounter: Payer: Self-pay | Admitting: Cardiology

## 2023-02-26 ENCOUNTER — Telehealth: Payer: Self-pay

## 2023-02-26 DIAGNOSIS — Z79899 Other long term (current) drug therapy: Secondary | ICD-10-CM | POA: Diagnosis not present

## 2023-02-26 DIAGNOSIS — Z961 Presence of intraocular lens: Secondary | ICD-10-CM | POA: Diagnosis not present

## 2023-02-26 DIAGNOSIS — H401133 Primary open-angle glaucoma, bilateral, severe stage: Secondary | ICD-10-CM | POA: Diagnosis not present

## 2023-02-26 DIAGNOSIS — H33021 Retinal detachment with multiple breaks, right eye: Secondary | ICD-10-CM | POA: Diagnosis not present

## 2023-02-26 DIAGNOSIS — H35371 Puckering of macula, right eye: Secondary | ICD-10-CM | POA: Diagnosis not present

## 2023-02-26 DIAGNOSIS — H33312 Horseshoe tear of retina without detachment, left eye: Secondary | ICD-10-CM | POA: Diagnosis not present

## 2023-02-26 NOTE — Telephone Encounter (Signed)
Per request of Dr Tamsen Meek, pt called and given soon appointment for March 6th 2026 at 2:40 pm.

## 2023-02-27 NOTE — Telephone Encounter (Signed)
Just got off the phone with Miss Wendy Jackson and booked her for March 6th.  Not a double book you had a rare opening.  Thanks,  [employer]

## 2023-03-03 DIAGNOSIS — K080 Exfoliation of teeth due to systemic causes: Secondary | ICD-10-CM | POA: Diagnosis not present

## 2023-03-05 ENCOUNTER — Encounter: Payer: Self-pay | Admitting: Pharmacist

## 2023-03-05 ENCOUNTER — Ambulatory Visit: Payer: Medicare Other | Attending: Internal Medicine | Admitting: Pharmacist

## 2023-03-05 VITALS — BP 177/71 | HR 82

## 2023-03-05 DIAGNOSIS — R768 Other specified abnormal immunological findings in serum: Secondary | ICD-10-CM | POA: Diagnosis not present

## 2023-03-05 DIAGNOSIS — I10 Essential (primary) hypertension: Secondary | ICD-10-CM | POA: Diagnosis not present

## 2023-03-05 DIAGNOSIS — M353 Polymyalgia rheumatica: Secondary | ICD-10-CM | POA: Diagnosis not present

## 2023-03-05 DIAGNOSIS — I63231 Cerebral infarction due to unspecified occlusion or stenosis of right carotid arteries: Secondary | ICD-10-CM

## 2023-03-05 DIAGNOSIS — R5383 Other fatigue: Secondary | ICD-10-CM | POA: Diagnosis not present

## 2023-03-05 DIAGNOSIS — R700 Elevated erythrocyte sedimentation rate: Secondary | ICD-10-CM | POA: Diagnosis not present

## 2023-03-05 LAB — COMPREHENSIVE METABOLIC PANEL
Calcium: 9.3 (ref 8.7–10.7)
Globulin: 4.6
eGFR: 80

## 2023-03-05 LAB — BASIC METABOLIC PANEL
BUN: 20 (ref 4–21)
CO2: 28 — AB (ref 13–22)
Chloride: 93 — AB (ref 99–108)
Creatinine: 0.8 (ref 0.5–1.1)
Glucose: 90
Potassium: 4.1 meq/L (ref 3.5–5.1)
Sodium: 134 — AB (ref 137–147)

## 2023-03-05 LAB — TSH: TSH: 1.89 (ref 0.41–5.90)

## 2023-03-05 LAB — CBC: RBC: 3.89 (ref 3.87–5.11)

## 2023-03-05 LAB — HEPATIC FUNCTION PANEL
ALT: 30 U/L (ref 7–35)
AST: 38 — AB (ref 13–35)
Alkaline Phosphatase: 98 (ref 25–125)
Bilirubin, Total: 0.4

## 2023-03-05 NOTE — Patient Instructions (Addendum)
It was nice meeting you two today  We would like your blood pressure to stay less than 130/80  Please check your blood pressure when you get home. Try to take it about 2 hours after your morning medications and once in the evening  For now, continue: Hydrochlorothiazide 25mg  daily in the morning Losartan 100mg  daily in the morning Verapamil 120mg  daily at night  You can consider trying furosemide to help reduce swelling  Please send over your readings via mychart or give me a call  Let us know if you have any questions  Laural Golden, PharmD, BCACP, CDCES, CPP [address] [place], [place], [address] Phone: [phone], Fax: [phone]

## 2023-03-05 NOTE — Progress Notes (Signed)
Patient ID: Wendy Jackson                 DOB: 1945/11/22                      MRN: [id number]     HPI: Wendy Jackson is a 78 y.o. female referred by Dr. Antoine Poche to HTN clinic. PMH is significant for CVA, CAD, HTN, SCT, coronary calcium and HLD.  Patient presents today with husband. Had rheumatology appointment this morning and will be started on two week course of prednisone. Does not know what her blood pressure was at appointment.   Reports blood pressure is routinely elevated at doctor appointments but at home is more controlled. She does not check often however.  Brought in home log. Took for 2 weeks following last appointment with Dr Antoine Poche but then stopped again. Has not checked in about a month.  From home log: 1/24 147/65 1/19: 127/70 1/19: 115/55 1/18: 125/65 1/18: 139/64 1/17: 161/67 1/17: 154/65  2 month avg: 136/64  Patient reports she does not drink caffeine (other than green tea rarely) or add salt to food. Has stress and worry due to frequent medical appointments but denies chest pain, SOB, or dizziness.  Has slight bilateral swelling in both ankles. Is wondering if it may be from verapamil since she had the same side effect from amlodipine.  Current HTN meds:  Furosemide 40mg  prn not taking Hydrochlorothiazide 25mg  daily in the morning Losartan 100mg  daily in the morning Verapamil 120mg  daily at night  BP goal: <130/80   Wt Readings from Last 3 Encounters:  02/13/23 90 lb 3.2 oz (40.9 kg)  02/03/23 89 lb (40.4 kg)  02/02/23 89 lb (40.4 kg)   BP Readings from Last 3 Encounters:  03/05/23 (!) 177/71  02/13/23 (!) 210/85  02/03/23 (!) 110/55   Pulse Readings from Last 3 Encounters:  03/05/23 82  02/13/23 74  02/03/23 (!) 56    Renal function: CrCl cannot be calculated (Patient's most recent lab result is older than the maximum 21 days allowed.).  Past Medical History:  Diagnosis Date   Cataract 03/2019   Glaucoma Approximately 2005   Horseshoe  retinal tear of left eye 04/02/2021   Hyperlipidemia LDL goal <70 01/11/2021   Hypertension 2022   Long-term use of Plaquenil 04/02/2021   Lupus    new rheum questioning original dx. continuing Plaquenil at lower dose for now.   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 90 tablet 4   aspirin EC 81 MG tablet Take 1 tablet (81 mg total) by mouth daily. Swallow whole. 30 tablet 11   cetirizine (ZYRTEC) 10 MG tablet Take 10 mg by mouth See admin instructions. Every 36 hours     estradiol (ESTRACE) 0.1 MG/GM vaginal cream Use 1/2 gram vaginally every night for the first 2 weeks, then use 1/2 gram three times per week at night.  Do not use the vaginal estrogen cream for 5 nights prior to your procedure. (Patient taking differently: Place 1 Applicatorful vaginally 3 (three) times a week.  Do not use the vaginal estrogen cream for 5 nights prior to your procedure.) 42.5 g 1   famotidine (PEPCID) 20 MG tablet Take 1 tablet (20 mg total) by mouth daily. 30 tablet 3   furosemide (LASIX) 40 MG tablet Take 40 mg by mouth daily as needed for fluid or edema.     hydrochlorothiazide (HYDRODIURIL) 25 MG tablet Take 1 tablet (25 mg total) by mouth daily. 90 tablet 3   hydroxychloroquine (PLAQUENIL) 200 MG tablet Take 200 mg by mouth daily.     latanoprost (XALATAN) 0.005 % ophthalmic solution Place 1 drop into the left eye at bedtime.     losartan (COZAAR) 100 MG tablet Take 1 tablet (100 mg total) by mouth daily. 90 tablet 1   Misc Natural Products (AIRBORNE ELDERBERRY) CHEW Chew 1 tablet by mouth in the morning and at bedtime.     Probiotic Product  (UP4 PROBIOTICS WOMENS PO) Take 1 capsule by mouth daily.     Resveratrol 50 MG CAPS Take 50 mg by mouth daily.     rosuvastatin (CRESTOR) 40 MG tablet Take 1 tablet (40 mg total) by mouth daily. 90 tablet 3   SIMBRINZA 1-0.2 % SUSP Place 1 drop into the left eye 3 (three) times daily.     Sodium Chloride-Xylitol (XLEAR SINUS CARE SPRAY NA) Place 4 sprays into the nose daily as needed (Moisturing).     Ubiquinol 100 MG CAPS Take 100 mg by mouth.     verapamil (CALAN-SR) 120 MG CR tablet Take 1 tablet (120 mg total) by mouth at bedtime. 90 tablet 1   No current facility-administered medications on file prior to visit.    Allergies  Allergen Reactions   Amlodipine Other (See Comments)    Bilateral lower extremity edema   Augmentin [Amoxicillin-Pot Clavulanate] Other (See Comments)    unk   Tramadol     Nausea    Flexeril [Cyclobenzaprine] Palpitations     Assessment/Plan:  1. Hypertension -  HYPERTENSION CONTROL Vitals:   03/05/23 1344 03/05/23 1353 03/05/23 1409  BP: (!) 197/75 (!) 210/73 (!) 177/71    The patient's blood pressure is elevated above target today.  In order to address the patient's elevated BP: The blood pressure is usually elevated in clinic.  Blood pressures monitored at home have been optimal.    Patient BP in room very elevated above goal of <130/80. Did decrease slightly in room during visit. Home readings much better controlled however no readings for past month. Will have patient continue to monitor BP at home since office BP readings untrustworthy. Patient will call and/or mychart with results.  Continue:  Furosemide 40mg  prn Hydrochlorothiazide 25mg  daily in the morning Losartan 100mg  daily in the morning Verapamil 120mg  daily at night Continue to check BP at home and contact clinic with readings  Laural Golden, PharmD, BCACP, CDCES, CPP [address] [place], [place], [address] Phone: [phone], Fax: [phone]

## 2023-03-10 LAB — CBC AND DIFFERENTIAL
HCT: 37 (ref 36–46)
Hemoglobin: 12.3 (ref 12.0–16.0)
Neutrophils Absolute: 4.6
Platelets: 206 10*3/uL (ref 150–400)
WBC: 6.5

## 2023-03-10 NOTE — Progress Notes (Signed)
 GYNECOLOGY  VISIT   HPI: 78 y.o.   Married  Caucasian female   G2P0002 with No LMP recorded. Patient is postmenopausal.   here for: 3 mo pessary check.  Using a number 3 ring with support.    Does not feel the pessary.  States it is holding the prolapse well.   Notes some yellow residue per patient.   Using vaginal estrogen cream three times a week.   She has some accidental leakage of stool.  She sees GI.  Doing pelvic floor therapy again and will start again in April.     She does not remove the pessary on her own.   GYNECOLOGIC HISTORY: No LMP recorded. Patient is postmenopausal. Contraception:  PMP Menopausal hormone therapy:  estrace  Last 2 paps:  10/09/22 ASCUS, atrophic pattern with epithelial atypia, 11/11/21 ASCUS (atrophic pattern with epithelial atypia): HR HPV neg  History of abnormal Pap or positive HPV:  yes.  Colposcopy 12/24/22 showed atypia.  Pap due in 12/2023. Mammogram:  11/13/22 Breast Density Cat C, BI-RADS CAT 1 neg         OB History     Gravida  2   Para      Term      Preterm      AB  0   Living  2      SAB  0   IAB      Ectopic  0   Multiple      Live Births                 Patient Active Problem List   Diagnosis Date Noted   High coronary artery calcium score: 1525; 96th percentile 01/29/2023   SVT (supraventricular tachycardia) (HCC) 12/31/2022   Other fatigue 12/31/2022   Decreased exercise tolerance 12/31/2022   Polymyalgia rheumatica (HCC) 11/25/2022   Polyarthralgia 11/25/2022   Other secondary scoliosis, lumbar region 11/17/2022   Venous stasis dermatitis of both lower extremities 08/05/2022   Skin lesion 05/06/2022   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   Other female genital prolapse 10/29/2021   Immunization not carried out because of parent refusal 05/06/2021   Long-term use of Plaquenil 04/02/2021   Primary open angle glaucoma (POAG) of both eyes, severe stage 04/02/2021   Primary hypertension 01/11/2021    Hyperlipidemia LDL goal <70 01/11/2021   Acquired thrombophilia (HCC) 12/11/2020   BMI less than 19,adult 12/11/2020   Hypothyroidism 12/11/2020   History of CVA (cerebrovascular accident) 05/02/2020   Lupus    Allergic rhinitis    CAD (coronary artery disease) 05/20/2018    Past Medical History:  Diagnosis Date   Cataract 03/2019   Glaucoma Approximately 2005   Horseshoe retinal tear of left eye 04/02/2021   Hyperlipidemia LDL goal <70 01/11/2021   Hypertension 2022   Long-term use of Plaquenil 04/02/2021   Lupus    new rheum questioning original dx. continuing Plaquenil at lower dose for now.   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 bladder, acquired    Pseudophakia of both eyes 04/02/2021   Retinal detachment with multiple breaks, right eye 04/02/2021   Stroke (HCC) 04/2020   Thyroid disease     Past Surgical History:  Procedure Laterality Date   ENDARTERECTOMY Right 05/02/2020   Procedure: RIGHT CAROTID ENDARTERECTOMY;  Surgeon: Larina Earthly, MD;  Location: [facility] OR;  Service: Vascular;  Laterality: Right;   EYE SURGERY  01/2020   LEFT HEART  CATH AND CORONARY ANGIOGRAPHY N/A 02/03/2023   Procedure: LEFT HEART CATH AND CORONARY ANGIOGRAPHY;  Surgeon: Kathleene Hazel, MD;  Location: [facility] INVASIVE CV LAB;  Service: Cardiovascular;  Laterality: N/A;   SCLERAL BUCKLE Right 2004   TUBAL LIGATION  Feb 1982    Current Outpatient Medications  Medication Sig Dispense Refill   ARMOUR THYROID 30 MG tablet Take 1 tablet (30 mg total) by mouth daily. 90 tablet 4   aspirin EC 81 MG tablet Take 1 tablet (81 mg total) by mouth daily. Swallow whole. 30 tablet 11   cetirizine (ZYRTEC) 10 MG tablet Take 10 mg by mouth See admin instructions. Every 36 hours     estradiol (ESTRACE) 0.1 MG/GM vaginal cream Use 1/2 gram vaginally every night for the first 2 weeks, then use 1/2 gram three times per week at night.  Do not use the vaginal estrogen cream  for 5 nights prior to your procedure. (Patient taking differently: Place 1 Applicatorful vaginally 3 (three) times a week.  Do not use the vaginal estrogen cream for 5 nights prior to your procedure.) 42.5 g 1   famotidine (PEPCID) 20 MG tablet Take 1 tablet (20 mg total) by mouth daily. 30 tablet 3   furosemide (LASIX) 40 MG tablet Take 40 mg by mouth daily as needed for fluid or edema.     hydrochlorothiazide (HYDRODIURIL) 25 MG tablet Take 1 tablet (25 mg total) by mouth daily. 90 tablet 3   hydroxychloroquine (PLAQUENIL) 200 MG tablet Take 200 mg by mouth daily.     latanoprost (XALATAN) 0.005 % ophthalmic solution Place 1 drop into the left eye at bedtime.     losartan (COZAAR) 100 MG tablet Take 1 tablet (100 mg total) by mouth daily. 90 tablet 1   Misc Natural Products (AIRBORNE ELDERBERRY) CHEW Chew 1 tablet by mouth in the morning and at bedtime.     predniSONE (DELTASONE) 5 MG tablet 3 tablet Orally Once a day for 30 days     Probiotic Product (UP4 PROBIOTICS WOMENS PO) Take 1 capsule by mouth daily.     Resveratrol 50 MG CAPS Take 50 mg by mouth daily.     rosuvastatin (CRESTOR) 40 MG tablet Take 1 tablet (40 mg total) by mouth daily. 90 tablet 3   SIMBRINZA 1-0.2 % SUSP Place 1 drop into the left eye 3 (three) times daily.     Sodium Chloride-Xylitol (XLEAR SINUS CARE SPRAY NA) Place 4 sprays into the nose daily as needed (Moisturing).     spironolactone (ALDACTONE) 25 MG tablet Take 1 tablet (25 mg total) by mouth daily. 90 tablet 3   Ubiquinol 100 MG CAPS Take 100 mg by mouth.     verapamil (CALAN-SR) 120 MG CR tablet Take 1 tablet (120 mg total) by mouth at bedtime. 90 tablet 1   No current facility-administered medications for this visit.     ALLERGIES: Amlodipine, Augmentin [amoxicillin-pot clavulanate], Tramadol, and Flexeril [cyclobenzaprine]  Family History  Problem Relation Age of Onset   ALS Mother    Early death Mother    Dementia Father    Hypertension Father     Macular degeneration Maternal Aunt    Alcohol abuse Paternal Uncle    Alcohol abuse Paternal Uncle     Social History   Socioeconomic History   Marital status: Married    Spouse name: Peyton Najjar   Number of children: 2   Years of education: Not on file   Highest education level: 12th grade  Occupational History   Occupation: retired   Occupation: retired  Tobacco Use   Smoking status: Never    Passive exposure: Never   Smokeless tobacco: Never  Vaping Use   Vaping status: Never Used  Substance and Sexual Activity   Alcohol use: Never   Drug use: Never   Sexual activity: Not Currently    Birth control/protection: Surgical  Other Topics Concern   Not on file  Social History Narrative   Marital status/children/pets: Married.  2 children.   Education/employment: Retired.  College-educated.   Safety:      -smoke alarm in the home:Yes     - wears seatbelt: Yes     - Feels safe in their relationships: Yes      Social Drivers of Corporate investment banker Strain: Low Risk  (01/25/2023)   Overall Financial Resource Strain (CARDIA)    Difficulty of Paying Living Expenses: Not hard at all  Food Insecurity: No Food Insecurity (01/25/2023)   Hunger Vital Sign    Worried About Running Out of Food in the Last Year: Never true    Ran Out of Food in the Last Year: Never true  Transportation Needs: No Transportation Needs (01/25/2023)   PRAPARE - Administrator, Civil Service (Medical): No    Lack of Transportation (Non-Medical): No  Physical Activity: Unknown (01/25/2023)   Exercise Vital Sign    Days of Exercise per Week: 0 days    Minutes of Exercise per Session: Patient declined  Stress: No Stress Concern Present (01/25/2023)   [employer] of Occupational Health - Occupational Stress Questionnaire    Feeling of Stress : Only a little  Social Connections: Moderately Isolated (01/25/2023)   Social Connection and Isolation Panel [NHANES]    Frequency of Communication  with Friends and Family: Once a week    Frequency of Social Gatherings with Friends and Family: Never    Attends Religious Services: More than 4 times per year    Active Member of [employer] or Organizations: No    Attends Banker Meetings: Not on file    Marital Status: Married  Catering manager Violence: Not At Risk (06/23/2022)   Humiliation, Afraid, Rape, and Kick questionnaire    Fear of Current or Ex-Partner: No    Emotionally Abused: No    Physically Abused: No    Sexually Abused: No    Review of Systems  All other systems reviewed and are negative.   PHYSICAL EXAMINATION:   BP 126/80 (BP Location: Left Arm, Patient Position: Sitting, Cuff Size: Small)   Pulse 69   Wt 90 lb (40.8 kg)   SpO2 100%   BMI 15.45 kg/m     General appearance: alert, cooperative and appears stated age   Pelvic: External genitalia:  no lesions              Urethra:  normal appearing urethra with no masses, tenderness or lesions              Bartholins and Skenes: normal                 Vagina: normal appearing vagina with normal color and discharge, minor erythema of the posterior vaginal apical mucosa.               Cervix: no lesions                Bimanual Exam:  Uterus:  normal size, contour, position, consistency, mobility, non-tender  Adnexa: no mass, fullness, tenderness  Pessary removed, cleansed, and replaced.   Chaperone was present for exam:  Warren Lacy, CMA  ASSESSMENT:  Incomplete uterovaginal prolapse.  Pessary maintenance.  Encounter for medication monitoring.  Hx cervical atypia.   PLAN:  Continue pessary use.  Continue use of vaginal estradiol cream 1/2 gram 3 times per week.  Refill given. Next pessary check in 3 months.  Plan for a repeat pap and HR HPV testing in December, 2025.    21 min  total time was spent for this patient encounter, including preparation, face-to-face counseling with the patient, coordination of care, and documentation of  the encounter.

## 2023-03-18 NOTE — Progress Notes (Unsigned)
 Cardiology Office Note:   Date:  03/19/2023  ID:  Wendy Jackson, DOB 1945/02/08, MRN [id number] PCP: Natalia Leatherwood, DO  [facility] Providers Cardiologist:  Rollene Rotunda, MD {  History of Present Illness:   Wendy Jackson is a 78 y.o. female who presents for evaluation of fatigue and difficult to control hypertension.  She has a history of CVA 2022.  She has had PVD with with right carotid endarterectomy. A previous echocardiogram at that time which was fairly unremarkable.    She has had multiple manipulations blood pressure medicines trying to find something will control her blood pressure.  She reports it to be spiking but I was able to review the readings from her phone and her average blood pressures in the 131/61 range.  She does fluctuate from systolics to the mid to high 140s down to the 120s.  However, it seems to be relatively well-controlled on the current regimen of medicines.    With her decreased exercise tolerance I did send her for a PET perfusion study and this was low risk with a normal EF.   I sent her for renal Doppler and she had no stenosis although there is plaque in the SMA.  She also had fatigue and monitor demonstrated occasional SVT with the longest lasting 11 minutes.    She had CAD with cath demonstrating RCA 70% stenosis, PDA 70% stenosis, proximal circumflex 80% stenosis, proximal and ostial LAD LAD has 50% stenosis followed by mid 90% stenosis.  First diagonal was 80% stenosis.Marland Kitchen   She was managed medically.    She saw Dr. Cliffton Asters.  She was discussed at the, heart, team, meeting and it was agreed that she would continue medical management.  76  She comes back for follow-up and she has had no chest discomfort.  She is not as fatigued as she was.  Her most exerting activity is climbing stairs.  With this activity she is not getting any chest pressure, neck or arm discomfort.  She is not having any new shortness of breath, PND or orthopnea.  She has had no new  palpitations, presyncope or syncope.  ROS: As stated in the HPI and negative for all other systems.  Studies Reviewed:    EKG:   NA  Risk Assessment/Calculations:         Physical Exam:   VS:  BP (!) 164/88 (Patient Position: Sitting)   Pulse 75    Wt Readings from Last 3 Encounters:  02/13/23 90 lb 3.2 oz (40.9 kg)  02/03/23 89 lb (40.4 kg)  02/02/23 89 lb (40.4 kg)     GEN: Well nourished, well developed in no acute distress NECK: No JVD; No carotid bruits CARDIAC: RRR, no murmurs, rubs, gallops RESPIRATORY:  Clear to auscultation without rales, wheezing or rhonchi  ABDOMEN: Soft, non-tender, non-distended EXTREMITIES:  No edema; No deformity   ASSESSMENT AND PLAN:    CAD:  The patient has no new sypmtoms.  No further cardiovascular testing is indicated.  We will continue with aggressive risk reduction and meds as listed.   We had a long discussion about this.  And I agree that she has no symptoms.  I think is reasonable to manage her with aggressive risk reduction.  Carotid stenosis: She is followed by [facility] and she has scheduled carotid Dopplers.   Hypertension: Her blood pressure is not at target.  Have already done renal Dopplers on her.  I am getting add spironolactone 25 mg daily and check a  basic metabolic profile in a couple of weeks.  She will keep a blood pressure diary.    Arrhythmia: She does have premature atrial contractions around 4%.  She is not having any symptoms related to this.  No change in therapy.  Fatigue:   I do not see a cardiac etiology to this.  No change in therapy.  Follow up with me in about 4 months  Signed, Rollene Rotunda, MD

## 2023-03-19 ENCOUNTER — Encounter: Payer: Self-pay | Admitting: Cardiology

## 2023-03-19 ENCOUNTER — Ambulatory Visit: Payer: Medicare Other | Attending: Cardiology | Admitting: Cardiology

## 2023-03-19 VITALS — BP 164/88 | HR 75

## 2023-03-19 DIAGNOSIS — I10 Essential (primary) hypertension: Secondary | ICD-10-CM

## 2023-03-19 DIAGNOSIS — I25119 Atherosclerotic heart disease of native coronary artery with unspecified angina pectoris: Secondary | ICD-10-CM

## 2023-03-19 DIAGNOSIS — I471 Supraventricular tachycardia, unspecified: Secondary | ICD-10-CM | POA: Diagnosis not present

## 2023-03-19 MED ORDER — SPIRONOLACTONE 25 MG PO TABS: 25.0000 mg | ORAL_TABLET | Freq: Every day | ORAL | 3 refills | Status: DC

## 2023-03-19 NOTE — Patient Instructions (Signed)
 Medication Instructions:  Start spironalactone 25 mg daily. *If you need a refill on your cardiac medications before your next appointment, please call your pharmacy*   Lab Work: BMET in 2 weeks. If you have labs (blood work) drawn today and your tests are completely normal, you will receive your results only by: MyChart Message (if you have MyChart) OR A paper copy in the mail If you have any lab test that is abnormal or we need to change your treatment, we will call you to review the results.    Follow-Up: At [facility], you and your health needs are our priority.  As part of our continuing mission to provide you with exceptional heart care, we have created designated Provider Care Teams.  These Care Teams include your primary Cardiologist (physician) and Advanced Practice Providers (APPs -  Physician Assistants and Nurse Practitioners) who all work together to provide you with the care you need, when you need it.  We recommend signing up for the patient portal called "MyChart".  Sign up information is provided on this After Visit Summary.  MyChart is used to connect with patients for Virtual Visits (Telemedicine).  Patients are able to view lab/test results, encounter notes, upcoming appointments, etc.  Non-urgent messages can be sent to your provider as well.   To learn more about what you can do with MyChart, go to [email or web address].    Your next appointment:   4 month(s)  Provider:   Rollene Rotunda, MD     Other Instructions

## 2023-03-22 IMAGING — CT CT HEAD W/O CM
3 series · 14 of 47 positions shown, 16 images · non-contrast
Comparison: None.

CLINICAL DATA: Dizziness, left hand numbness

EXAM:
CT HEAD WITHOUT CONTRAST
TECHNIQUE: Contiguous axial images were obtained from the base of the skull
through the vertex without intravenous contrast.

[Series 2: head wo · axial · 0.47mm/px · z∈[+1395,+1525]mm · 8 of 32 slices shown, 10 images]
[im 3/32  brain]
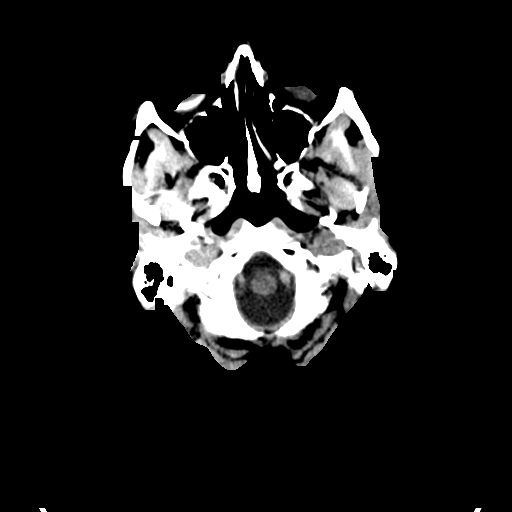
[im 3/32  bone]
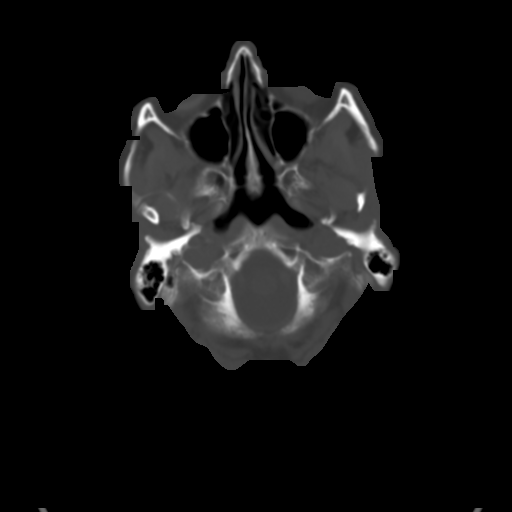
[im 7/32  brain]
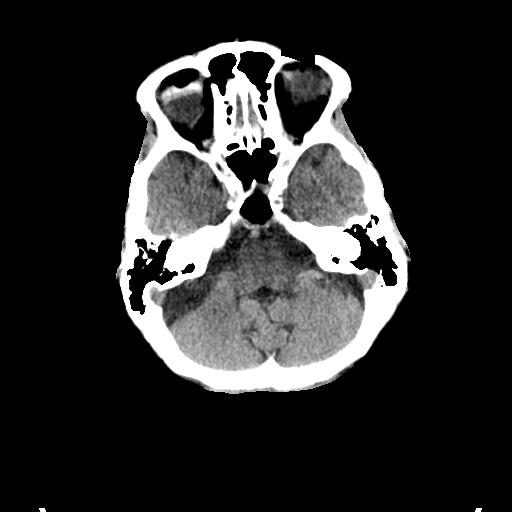
[im 10/32  brain]
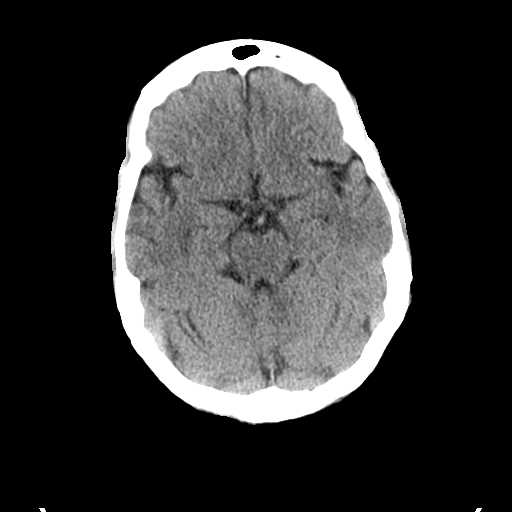
[im 14/32  brain]
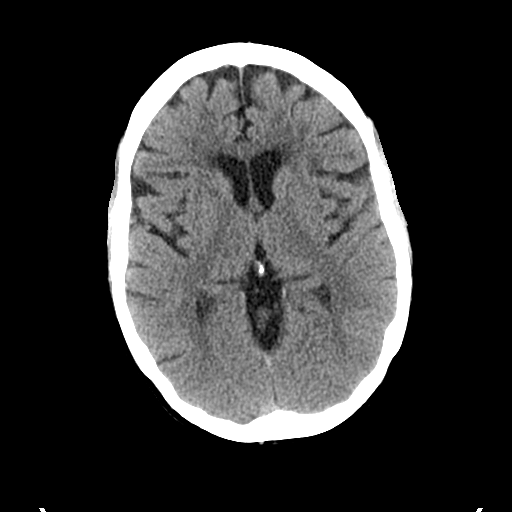
[im 18/32  brain]
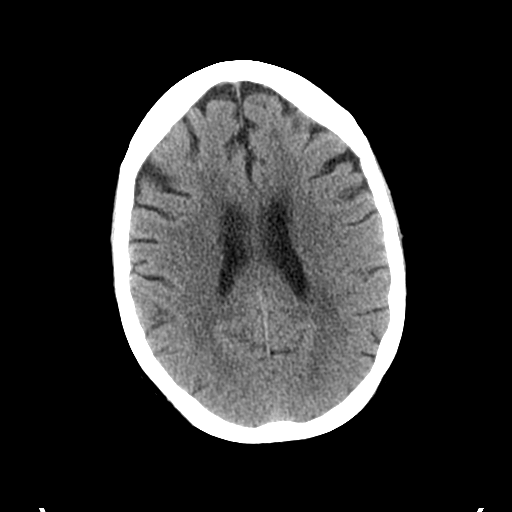
[im 18/32  bone]
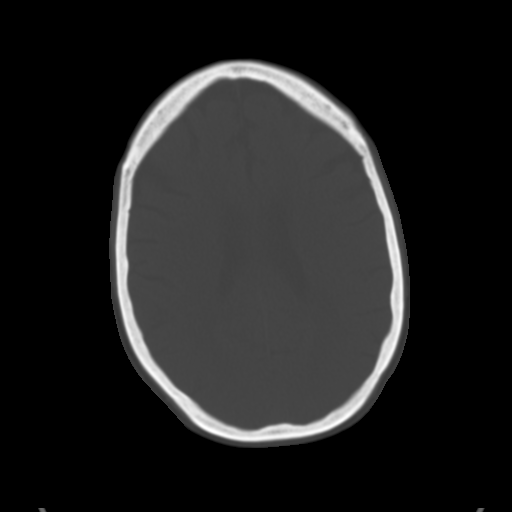
[im 22/32  brain]
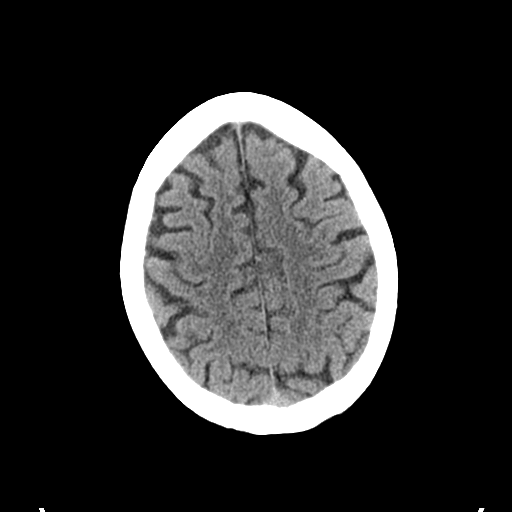
[im 25/32  brain]
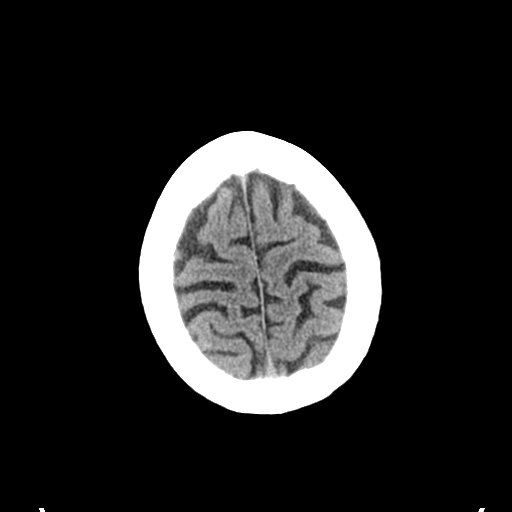
[im 29/32  brain]
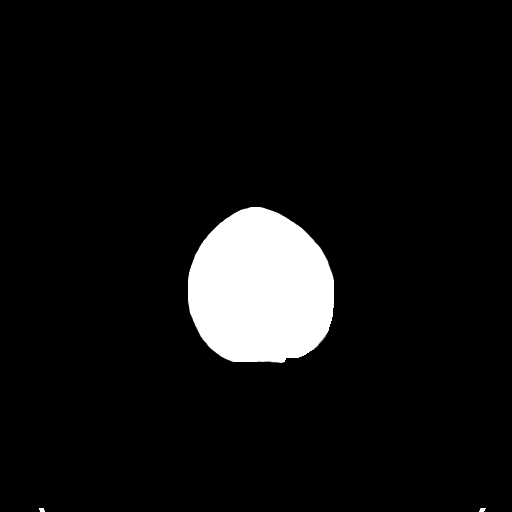

[Series 5: coronal soft tissue · coronal · 0.32mm/px · 3 of 69 slices shown]
[im 23/69  brain]
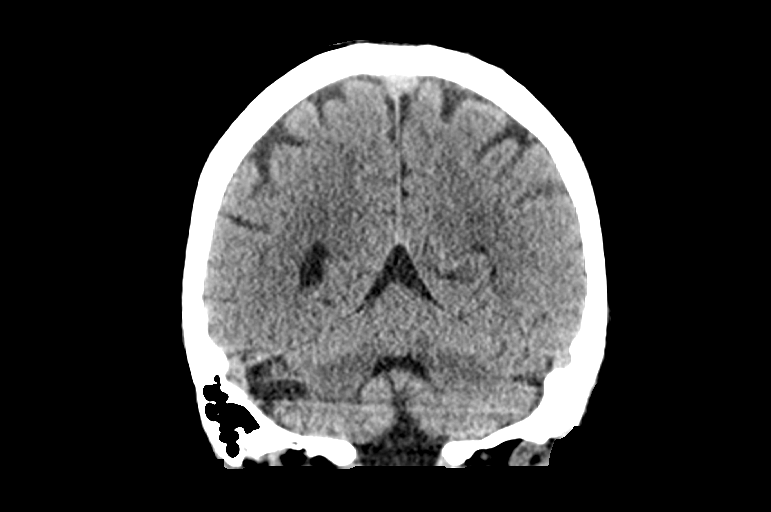
[im 31/69  brain]
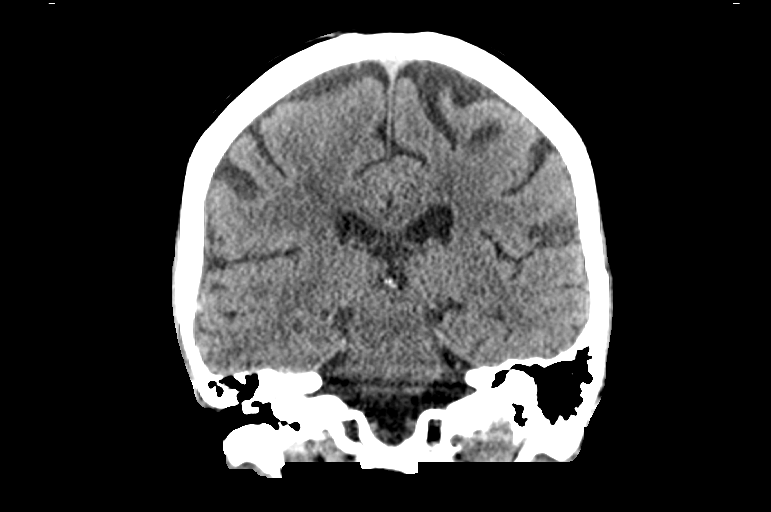
[im 38/69  brain]
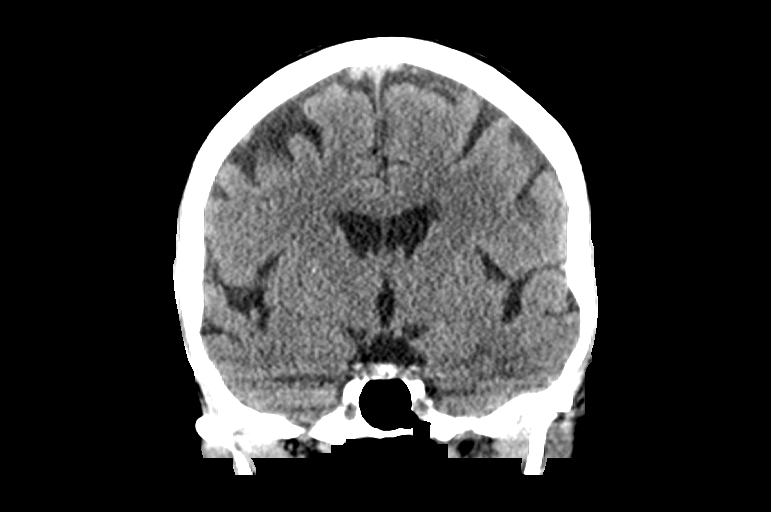

[Series 6: sagittal soft tissue · sagittal · 0.32mm/px · 3 of 56 slices shown]
[im 19/56  brain]
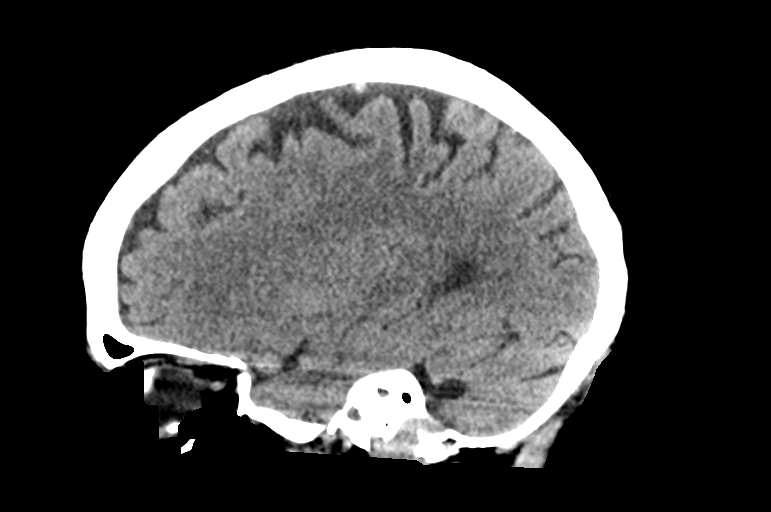
[im 28/56  brain]
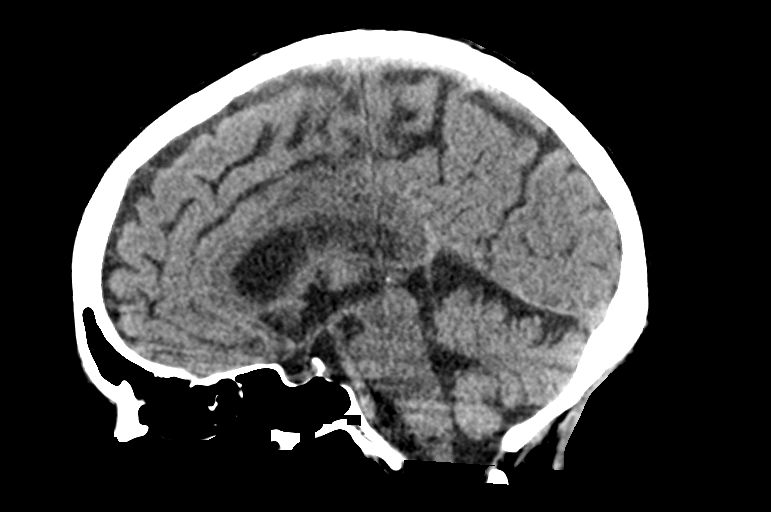
[im 37/56  brain]
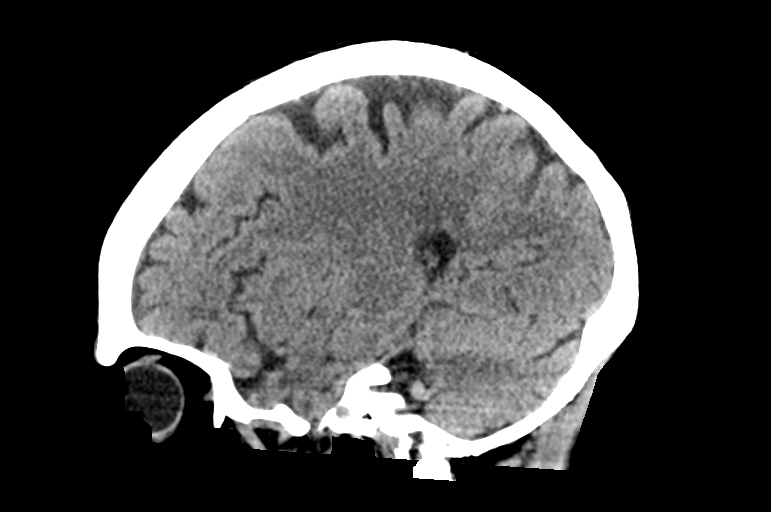

[14 of 47 positions shown; findings below may reference images not displayed]

FINDINGS: Brain: No acute intracranial abnormality. Specifically, no
hemorrhage, hydrocephalus, mass lesion, acute infarction, or
significant intracranial injury.

Vascular: No hyperdense vessel or unexpected calcification.

Skull: No acute calvarial abnormality.

Sinuses/Orbits: Visualized paranasal sinuses and mastoids clear.
Orbital soft tissues unremarkable.

Other: None
IMPRESSION: No acute intracranial abnormality.

## 2023-03-22 IMAGING — CR DG CHEST 2V
2 series · 2 of 2 positions shown · non-contrast
Comparison: None.

CLINICAL DATA: Numbness, left hand weakness

EXAM:
CHEST - 2 VIEW

[w chest pa]
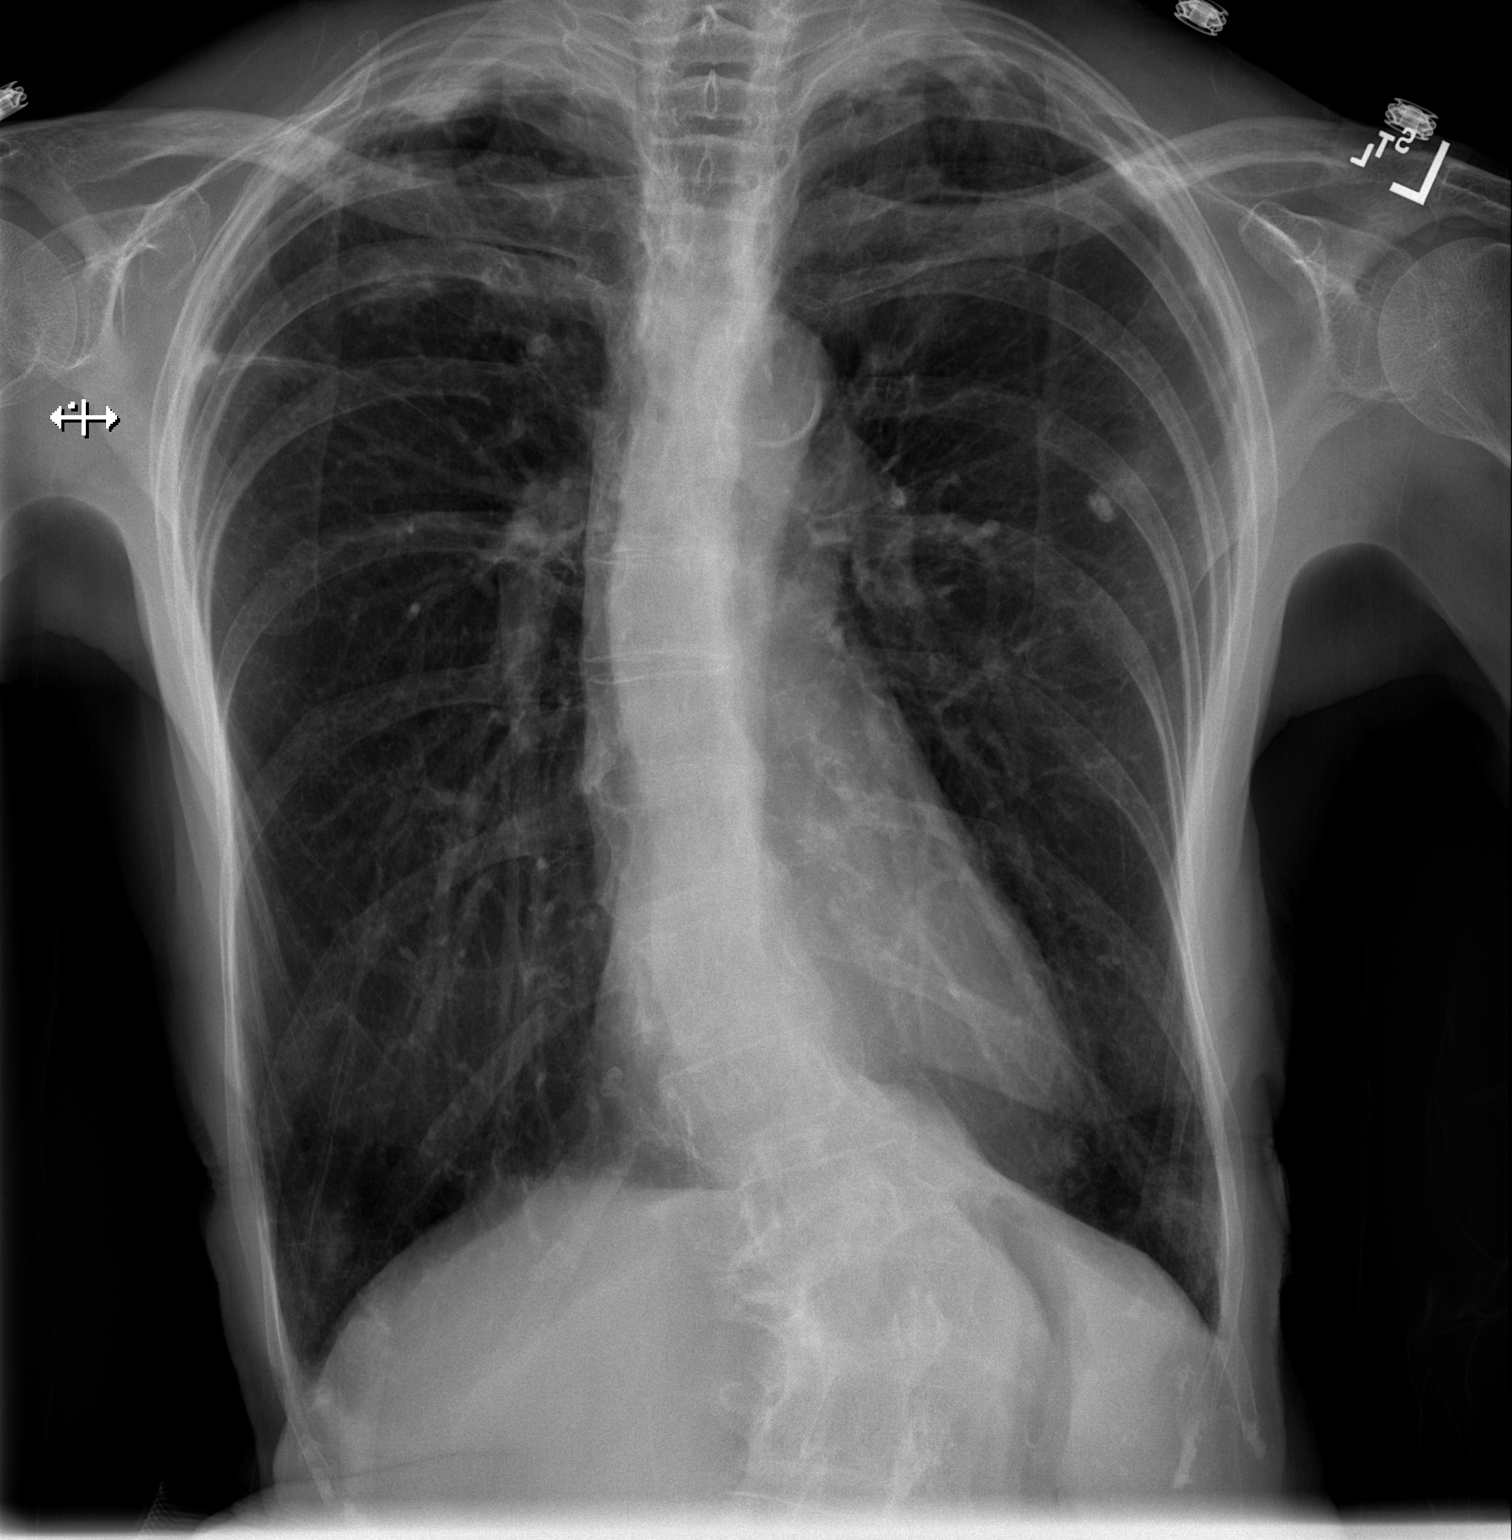

[w chest lat]
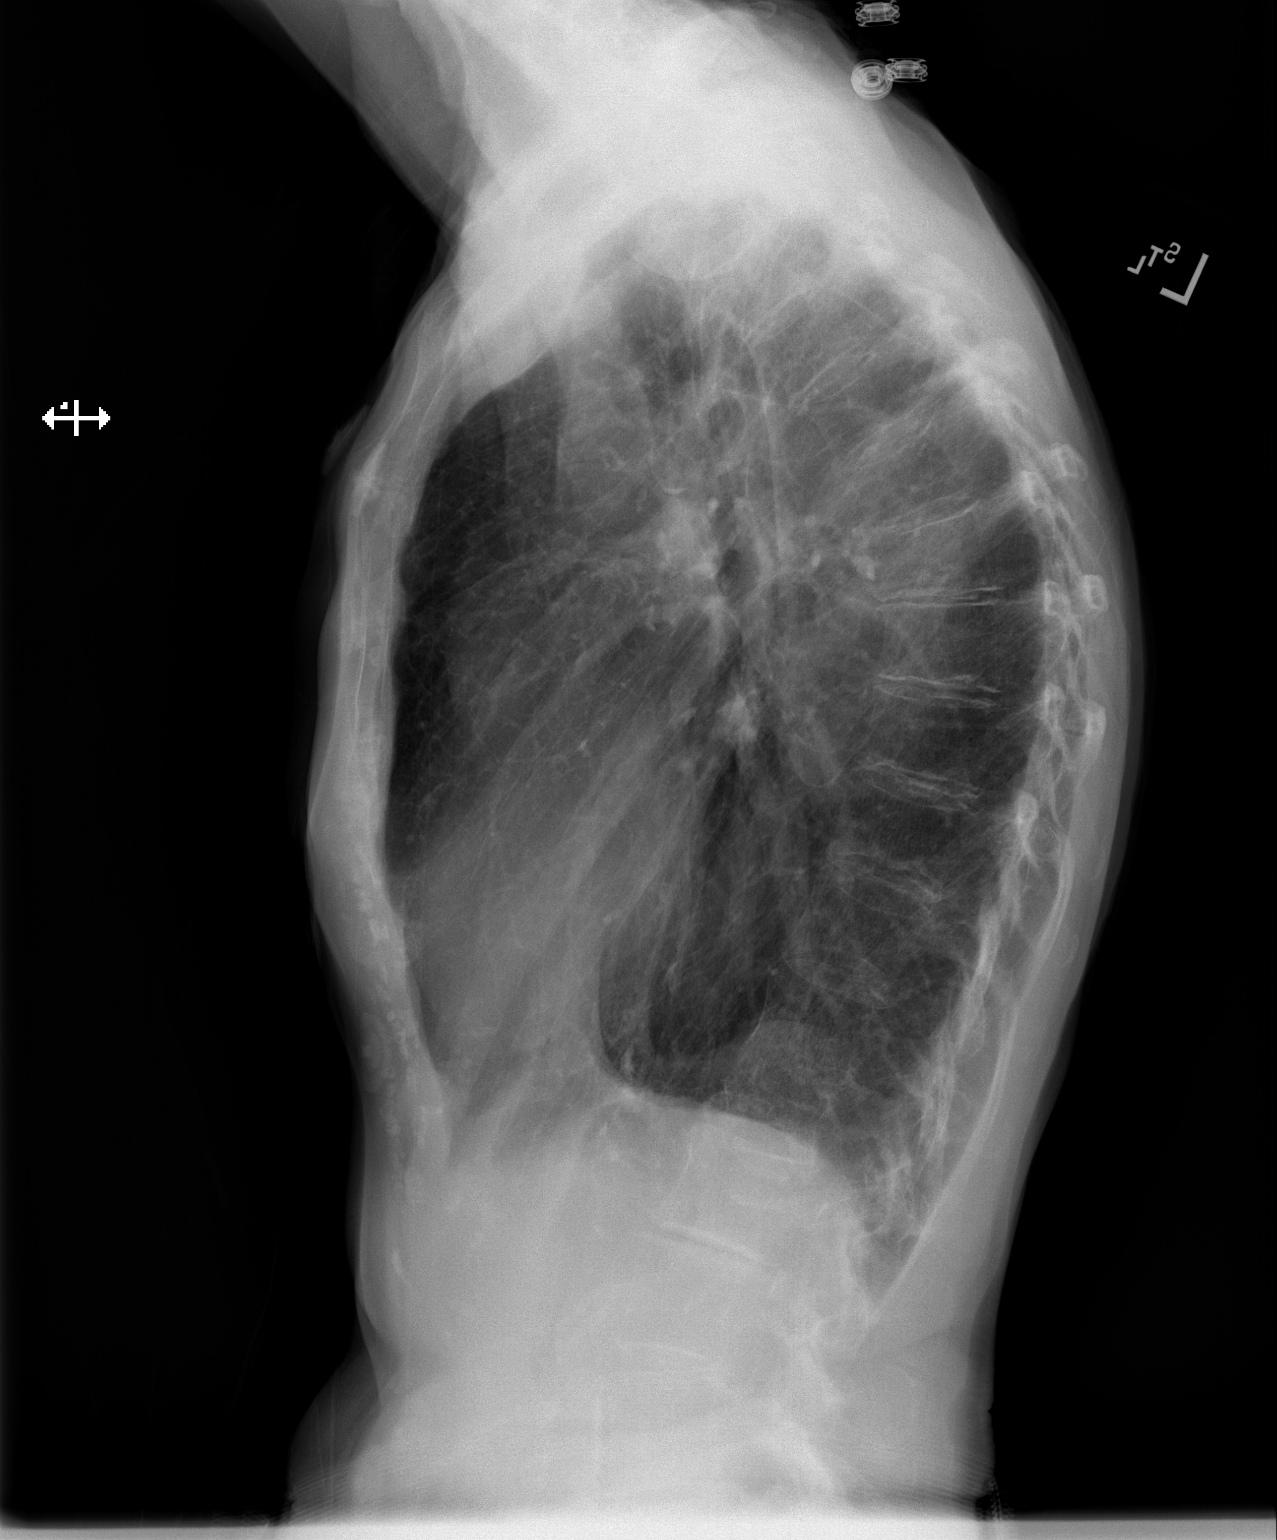

[2 of 2 positions shown; findings below may reference images not displayed]

FINDINGS: There is hyperinflation of the lungs compatible with COPD. Calcified
granuloma in the left upper lobe. Biapical scarring. No acute
confluent opacities. Heart is normal size. No acute bony
abnormality.
IMPRESSION: COPD/chronic changes.  No active disease.

## 2023-03-24 ENCOUNTER — Ambulatory Visit: Payer: Medicare Other | Admitting: Obstetrics and Gynecology

## 2023-03-24 ENCOUNTER — Encounter: Payer: Self-pay | Admitting: Obstetrics and Gynecology

## 2023-03-24 ENCOUNTER — Ambulatory Visit: Payer: Medicare Other | Admitting: Gastroenterology

## 2023-03-24 VITALS — BP 126/80 | HR 69 | Wt 90.0 lb

## 2023-03-24 DIAGNOSIS — N812 Incomplete uterovaginal prolapse: Secondary | ICD-10-CM

## 2023-03-24 DIAGNOSIS — Z4689 Encounter for fitting and adjustment of other specified devices: Secondary | ICD-10-CM | POA: Diagnosis not present

## 2023-03-24 DIAGNOSIS — Z5181 Encounter for therapeutic drug level monitoring: Secondary | ICD-10-CM

## 2023-03-24 MED ORDER — ESTRADIOL 0.1 MG/GM VA CREA: TOPICAL_CREAM | VAGINAL | 0 refills | Status: DC

## 2023-03-25 ENCOUNTER — Ambulatory Visit: Payer: Medicare Other | Admitting: Gastroenterology

## 2023-03-25 DIAGNOSIS — M353 Polymyalgia rheumatica: Secondary | ICD-10-CM | POA: Diagnosis not present

## 2023-03-25 DIAGNOSIS — R768 Other specified abnormal immunological findings in serum: Secondary | ICD-10-CM | POA: Diagnosis not present

## 2023-03-25 DIAGNOSIS — R5383 Other fatigue: Secondary | ICD-10-CM | POA: Diagnosis not present

## 2023-03-25 DIAGNOSIS — R700 Elevated erythrocyte sedimentation rate: Secondary | ICD-10-CM | POA: Diagnosis not present

## 2023-03-29 ENCOUNTER — Encounter: Payer: Self-pay | Admitting: Cardiology

## 2023-03-29 DIAGNOSIS — I10 Essential (primary) hypertension: Secondary | ICD-10-CM

## 2023-03-30 NOTE — Addendum Note (Signed)
 Addended by: Alyson Ingles on: 03/30/2023 04:44 PM   Modules accepted: Orders

## 2023-03-30 NOTE — Telephone Encounter (Signed)
 Pt notified of Dr Hochrein's message she will await phone from [facility] scheduling. She will continue to take BP and will keep [place] updated

## 2023-03-31 ENCOUNTER — Telehealth: Payer: Self-pay

## 2023-03-31 NOTE — Telephone Encounter (Signed)
 Pt is requesting TB screening as a requirement for an independent living facility she is planning to move to. Pt is scheduled for 2/24, advised we would wait until her appt for provider approval.

## 2023-04-02 ENCOUNTER — Encounter: Payer: Self-pay | Admitting: Family Medicine

## 2023-04-02 ENCOUNTER — Ambulatory Visit: Payer: Medicare Other | Admitting: Gastroenterology

## 2023-04-03 ENCOUNTER — Encounter: Payer: Self-pay | Admitting: Pharmacist

## 2023-04-03 ENCOUNTER — Other Ambulatory Visit: Payer: Self-pay

## 2023-04-03 ENCOUNTER — Ambulatory Visit: Attending: Cardiology | Admitting: Pharmacist

## 2023-04-03 VITALS — BP 197/71 | HR 67

## 2023-04-03 DIAGNOSIS — R700 Elevated erythrocyte sedimentation rate: Secondary | ICD-10-CM | POA: Insufficient documentation

## 2023-04-03 DIAGNOSIS — I10 Essential (primary) hypertension: Secondary | ICD-10-CM

## 2023-04-03 DIAGNOSIS — R768 Other specified abnormal immunological findings in serum: Secondary | ICD-10-CM | POA: Insufficient documentation

## 2023-04-03 NOTE — Patient Instructions (Addendum)
 It was good seeing you two again  We would like your blood pressure to be less than 130/80 and it is improving  Please switch your losartan 100mg  to the evening along with your verapamil  Continue hydrochlorothiazide 25mg  in the morning  Continue to check your blood pressure at home and send over your readings in 2 weeks  Please let us know if you have any questions or concerns  Laural Golden, PharmD, BCACP, CDCES, CPP [address], [address] [place], [place], [address] Phone: [phone], Fax: [phone]

## 2023-04-03 NOTE — Progress Notes (Signed)
 Patient ID: Wendy Jackson                 DOB: 1945/10/24                      MRN: [id number]     HPI: Wendy Jackson is a 78 y.o. female referred by Dr. Antoine Poche to HTN clinic. PMH is significant for CVA, CAD, HTN, SCT, coronary calcium and HLD.  Patient presents today for HTN follow up. Has a history of white coat HTN and was instructed to keep a home BP log since in office readings were not trustworthy.   Started on spironolactone at last visit with Dr Antoine Poche. However she discontinued 9 days later after feeling poorly on it due to dizziness and lightheadedness.  Recently finished prednisone pack from rheumatology who also decreased her hydroxychloroquine dose.   Swelling in lower extremities has decreased. Takes morning medications around 9am and evening medications between 9-10pm. Has not needed to take furosemide.  From home log (AM/PM): 3/21: 150/65 3/20: 158/73, 126/55 3/19: 137/62, 120/52 3/18: 147/68, 128/65 3/17: 157/76, 104/49  Average from last week: 136/62  Current HTN meds:  Furosemide 40mg  prn not taking Hydrochlorothiazide 25mg  daily in the morning Losartan 100mg  daily in the morning Verapamil 120mg  daily at night  BP goal: <130/80   Wt Readings from Last 3 Encounters:  03/24/23 90 lb (40.8 kg)  02/13/23 90 lb 3.2 oz (40.9 kg)  02/03/23 89 lb (40.4 kg)   BP Readings from Last 3 Encounters:  03/24/23 126/80  03/19/23 (!) 164/88  03/05/23 (!) 177/71   Pulse Readings from Last 3 Encounters:  03/24/23 69  03/19/23 75  03/05/23 82    Renal function: CrCl cannot be calculated (Patient's most recent lab result is older than the maximum 21 days allowed.).  Past Medical History:  Diagnosis Date   Cataract 03/2019   Glaucoma Approximately 2005   Horseshoe retinal tear of left eye 04/02/2021   Hyperlipidemia LDL goal <70 01/11/2021   Hypertension 2022   Long-term use of Plaquenil 04/02/2021   Lupus    new rheum questioning original dx. continuing  Plaquenil at lower dose for now.   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 90 tablet 4   aspirin EC 81 MG tablet Take 1 tablet (81 mg total) by mouth daily. Swallow whole. 30 tablet 11   cetirizine (ZYRTEC) 10 MG tablet Take 10 mg by mouth See admin instructions. Every 36 hours     estradiol (ESTRACE) 0.1 MG/GM vaginal cream Use 1/2 gram three times per week at night. 42.5 g 0   famotidine (PEPCID) 20 MG tablet Take 1 tablet (20 mg total) by mouth daily. 30 tablet 3   furosemide (LASIX) 40 MG tablet Take 40 mg by mouth daily as needed for fluid or edema.     hydrochlorothiazide (HYDRODIURIL) 25 MG tablet Take 1 tablet (25 mg total) by mouth daily. 90 tablet 3   hydroxychloroquine (PLAQUENIL) 200 MG tablet Take 200 mg by mouth daily.     latanoprost (XALATAN) 0.005 % ophthalmic  solution Place 1 drop into the left eye at bedtime.     losartan (COZAAR) 100 MG tablet Take 1 tablet (100 mg total) by mouth daily. 90 tablet 1   Misc Natural Products (AIRBORNE ELDERBERRY) CHEW Chew 1 tablet by mouth in the morning and at bedtime.     Probiotic Product (UP4 PROBIOTICS WOMENS PO) Take 1 capsule by mouth daily.     Resveratrol 50 MG CAPS Take 50 mg by mouth daily.     rosuvastatin (CRESTOR) 40 MG tablet Take 1 tablet (40 mg total) by mouth daily. 90 tablet 3   SIMBRINZA 1-0.2 % SUSP Place 1 drop into the left eye 3 (three) times daily.     Sodium Chloride-Xylitol (XLEAR SINUS CARE SPRAY NA) Place 4 sprays into the nose daily as needed (Moisturing).     Ubiquinol 100 MG CAPS Take 100 mg by mouth.     verapamil (CALAN-SR) 120 MG CR tablet  Take 1 tablet (120 mg total) by mouth at bedtime. 90 tablet 1   No current facility-administered medications on file prior to visit.    Allergies  Allergen Reactions   Amlodipine Other (See Comments)    Bilateral lower extremity edema   Augmentin [Amoxicillin-Pot Clavulanate] Other (See Comments)    unk   Tramadol     Nausea    Flexeril [Cyclobenzaprine] Palpitations     Assessment/Plan:  1. Hypertension -  Patient BP very elevated in room at 197/71. Patient has a history of white coat hypertension. Readings at home much lower. PM readings actually at goal of <130/80 this week. Will move losartan to evening to see it this helps with her morning readings. Recheck in 2 months.  Continue: Furosemide 40mg  prn Hydrochlorothiazide 25mg  daily in the morning Verapamil 120mg  daily at night Change losartan 100mg  to the evening Recheck in 2 months  Laural Golden, PharmD, BCACP, CDCES, CPP [address], [address] [place], [place], [address] Phone: [phone], Fax: [phone]

## 2023-04-04 LAB — BASIC METABOLIC PANEL
BUN/Creatinine Ratio: 29 — ABNORMAL HIGH (ref 12–28)
BUN: 18 mg/dL (ref 8–27)
CO2: 29 mmol/L (ref 20–29)
Calcium: 9.4 mg/dL (ref 8.7–10.3)
Chloride: 93 mmol/L — ABNORMAL LOW (ref 96–106)
Creatinine, Ser: 0.62 mg/dL (ref 0.57–1.00)
Glucose: 79 mg/dL (ref 70–99)
Potassium: 4.2 mmol/L (ref 3.5–5.2)
Sodium: 135 mmol/L (ref 134–144)
eGFR: 92 mL/min/{1.73_m2} (ref >59)

## 2023-04-06 ENCOUNTER — Ambulatory Visit (INDEPENDENT_AMBULATORY_CARE_PROVIDER_SITE_OTHER): Admitting: Family Medicine

## 2023-04-06 ENCOUNTER — Encounter: Payer: Self-pay | Admitting: Family Medicine

## 2023-04-06 VITALS — BP 146/64 | HR 70 | Temp 98.2°F | Wt 90.2 lb

## 2023-04-06 DIAGNOSIS — I10 Essential (primary) hypertension: Secondary | ICD-10-CM

## 2023-04-06 DIAGNOSIS — Z111 Encounter for screening for respiratory tuberculosis: Secondary | ICD-10-CM

## 2023-04-06 DIAGNOSIS — Z008 Encounter for other general examination: Secondary | ICD-10-CM

## 2023-04-06 NOTE — Patient Instructions (Signed)

## 2023-04-06 NOTE — Progress Notes (Signed)
 Wendy Jackson , 04-21-1945, 78 y.o., female MRN: [id number] Patient Care Team    Relationship Specialty Notifications Start End  Natalia Leatherwood, DO PCP - General Family Medicine  12/11/20   Rollene Rotunda, MD PCP - Cardiology Cardiology  01/01/23   Ihor Austin, NP Nurse Practitioner Neurology  05/23/20   Hulen Shouts, MD Referring Physician Ophthalmology  05/23/20   Dr. Lennon Alstrom Attending Physician Rheumatology  12/10/20    Comment: [place], [place]  Patton Salles, MD Consulting Physician Obstetrics and Gynecology  11/11/21     Chief Complaint  Patient presents with   Review of Medical History     Subjective: Wendy Jackson is a 78 y.o. Pt presents for an OV to discuss tuberculosis screening and review of medical history and application for independent living facility-[facility]. Patient performs all of her basic life skills/ADLs for herself. MMSE today is 30/30-normal. Patient is a low fall risk, neuroexam is normal. TB screening will be performed today but would Wendy Jackson blood test. Patient's medical conditions are stable.     04/06/2023    2:40 PM 04/06/2023    2:39 PM 04/06/2023    2:35 PM  MMSE - Mini Mental State Exam  Orientation to time   5  Orientation to Place   5  Registration   3  Attention/ Calculation   5  Recall   3  Language- name 2 objects 2  2  Language- repeat  1 1  Language- follow 3 step command   3  Language- read & follow direction  1 1  Write a sentence   1  Copy design   1  Total score   30       09/02/2022    8:42 AM 06/23/2022    2:30 PM 05/06/2022   12:51 PM 06/12/2021    1:56 PM 06/03/2021    1:41 PM  Depression screen PHQ 2/9  Decreased Interest 0 0 0 0 0  Down, Depressed, Hopeless 0 0 0 0 0  PHQ - 2 Score 0 0 0 0 0    Allergies  Allergen Reactions   Amlodipine Other (See Comments)    Bilateral lower extremity edema   Augmentin [Amoxicillin-Pot Clavulanate] Other (See Comments)    unk   Tramadol      Nausea    Flexeril [Cyclobenzaprine] Palpitations   Social History   Social History Narrative   Marital status/children/pets: Married.  2 children.   Education/employment: Retired.  College-educated.   Safety:      -smoke alarm in the home:Yes     - wears seatbelt: Yes     - Feels safe in their relationships: Yes      Past Medical History:  Diagnosis Date   Cataract 03/2019   Glaucoma Approximately 2005   Horseshoe retinal tear of left eye 04/02/2021   Hyperlipidemia LDL goal <70 01/11/2021   Hypertension 2022   Long-term use of Plaquenil 04/02/2021   Lupus    new rheum questioning original dx. continuing Plaquenil at lower dose for now.   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 bladder, acquired    Pseudophakia of both eyes 04/02/2021   Retinal detachment with multiple breaks, right eye 04/02/2021   Stroke (HCC) 04/2020   Thyroid disease    Past Surgical History:  Procedure Laterality Date   ENDARTERECTOMY Right 05/02/2020   Procedure: RIGHT CAROTID ENDARTERECTOMY;  Surgeon: Arbie Cookey,  Kristen Loader, MD;  Location: [facility] OR;  Service: Vascular;  Laterality: Right;   EYE SURGERY  01/2020   LEFT HEART CATH AND CORONARY ANGIOGRAPHY N/A 02/03/2023   Procedure: LEFT HEART CATH AND CORONARY ANGIOGRAPHY;  Surgeon: Kathleene Hazel, MD;  Location: [facility] INVASIVE CV LAB;  Service: Cardiovascular;  Laterality: N/A;   SCLERAL BUCKLE Right 2004   TUBAL LIGATION  Feb 1982   Family History  Problem Relation Age of Onset   ALS Mother    Early death Mother    Dementia Father    Hypertension Father    Macular degeneration Maternal Aunt    Alcohol abuse Paternal Uncle    Alcohol abuse Paternal Uncle    Allergies as of 04/06/2023       Reactions   Amlodipine Other (See Comments)   Bilateral lower extremity edema   Augmentin [amoxicillin-pot Clavulanate] Other (See Comments)   unk   Tramadol    Nausea   Flexeril [cyclobenzaprine] Palpitations         Medication List        Accurate as of April 06, 2023  3:21 PM. If you have any questions, ask your nurse or doctor.          Airborne Elderberry 100-50 MG Chew Chew 1 tablet by mouth in the morning and at bedtime.   Armour Thyroid 30 MG tablet Generic drug: thyroid Take 1 tablet (30 mg total) by mouth daily.   aspirin EC 81 MG tablet Take 1 tablet (81 mg total) by mouth daily. Swallow whole.   cetirizine 10 MG tablet Commonly known as: ZYRTEC Take 10 mg by mouth See admin instructions. Every 36 hours   estradiol 0.1 MG/GM vaginal cream Commonly known as: ESTRACE Use 1/2 gram three times per week at night.   famotidine 20 MG tablet Commonly known as: Pepcid Take 1 tablet (20 mg total) by mouth daily.   furosemide 40 MG tablet Commonly known as: LASIX Take 40 mg by mouth daily as needed for fluid or edema.   hydrochlorothiazide 25 MG tablet Commonly known as: HYDRODIURIL Take 1 tablet (25 mg total) by mouth daily.   hydroxychloroquine 200 MG tablet Commonly known as: PLAQUENIL Take 200 mg by mouth every other day.   latanoprost 0.005 % ophthalmic solution Commonly known as: XALATAN Place 1 drop into the left eye at bedtime.   losartan 100 MG tablet Commonly known as: COZAAR Take 1 tablet (100 mg total) by mouth daily.   Resveratrol 50 MG Caps Take 50 mg by mouth daily.   rosuvastatin 40 MG tablet Commonly known as: Crestor Take 1 tablet (40 mg total) by mouth daily.   Simbrinza 1-0.2 % Susp Generic drug: Brinzolamide-Brimonidine Place 1 drop into the left eye 3 (three) times daily.   Ubiquinol 100 MG Caps Take 100 mg by mouth.   UP4 PROBIOTICS WOMENS PO Take 1 capsule by mouth daily.   verapamil 120 MG CR tablet Commonly known as: CALAN-SR Take 1 tablet (120 mg total) by mouth at bedtime.   XLEAR SINUS CARE SPRAY NA Place 4 sprays into the nose daily as needed (Moisturing).        All past medical history, surgical history,  allergies, family history, immunizations andmedications were updated in the EMR today and reviewed under the history and medication portions of their EMR.     ROS Negative, with the exception of above mentioned in HPI   Objective:  BP (!) 146/64   Pulse 70  Temp 98.2 F (36.8 C)   Wt 90 lb 3.2 oz (40.9 kg)   SpO2 97%   BMI 15.48 kg/m  Body mass index is 15.48 kg/m. Physical Exam Vitals and nursing note reviewed.  Constitutional:      General: She is not in acute distress.    Appearance: Normal appearance. She is not ill-appearing, toxic-appearing or diaphoretic.  HENT:     Head: Normocephalic and atraumatic.  Eyes:     General: No visual field deficit or scleral icterus.       Right eye: No discharge.        Left eye: No discharge.     Extraocular Movements: Extraocular movements intact.     Conjunctiva/sclera: Conjunctivae normal.     Pupils: Pupils are equal, round, and reactive to light.  Cardiovascular:     Rate and Rhythm: Normal rate and regular rhythm.  Pulmonary:     Effort: Pulmonary effort is normal. No respiratory distress.     Breath sounds: Normal breath sounds. No wheezing, rhonchi or rales.  Musculoskeletal:     Right lower leg: No edema.     Left lower leg: No edema.  Skin:    General: Skin is warm.     Findings: No rash.  Neurological:     Mental Status: She is alert and oriented to person, place, and time. Mental status is at baseline.     Cranial Nerves: No dysarthria or facial asymmetry.     Motor: No weakness.     Coordination: Romberg sign negative. Coordination normal.     Gait: Gait normal.     Deep Tendon Reflexes: Reflexes normal.  Psychiatric:        Mood and Affect: Mood normal.        Behavior: Behavior normal.        Thought Content: Thought content normal.        Judgment: Judgment normal.     No results found. No results found. No results found for this or any previous visit (from the past 24  hours).  Assessment/Plan: Wendy Jackson is a 78 y.o. female present for OV for  Screening-pulmonary TB - QuantiFERON-TB Jackson Plus Encounter for mini-mental status examination 30/30 score-normal Primary hypertension (Primary) Stable Controlled medical conditions  Form for independent living facility completed today during visit, will fax for her once we get the results of the QuantiFERON Jackson.  Reviewed expectations re: course of current medical issues. Discussed self-management of symptoms. Outlined signs and symptoms indicating need for more acute intervention. Patient verbalized understanding and all questions were answered. Patient received an After-Visit Summary.    Orders Placed This Encounter  Procedures   QuantiFERON-TB Jackson Plus   No orders of the defined types were placed in this encounter.  Referral Orders  No referral(s) requested today     Note is dictated utilizing voice recognition software. Although note has been proof read prior to signing, occasional typographical errors still can be missed. If any questions arise, please do not hesitate to call for verification.   electronically signed by:  Felix Pacini, DO  [facility] [place]

## 2023-04-08 LAB — QUANTIFERON-TB GOLD PLUS
Mitogen-NIL: 5.62 [IU]/mL
NIL: 0.03 [IU]/mL
QuantiFERON-TB Gold Plus: NEGATIVE
TB1-NIL: 0.01 [IU]/mL
TB2-NIL: 0 [IU]/mL

## 2023-04-16 ENCOUNTER — Ambulatory Visit: Payer: Medicare Other | Admitting: Cardiology

## 2023-04-17 ENCOUNTER — Encounter: Payer: Self-pay | Admitting: Cardiology

## 2023-04-17 DIAGNOSIS — I10 Essential (primary) hypertension: Secondary | ICD-10-CM

## 2023-04-20 ENCOUNTER — Encounter: Payer: Self-pay | Admitting: Physical Therapy

## 2023-04-20 ENCOUNTER — Ambulatory Visit: Payer: Medicare Other | Attending: Gastroenterology | Admitting: Physical Therapy

## 2023-04-20 ENCOUNTER — Other Ambulatory Visit: Payer: Self-pay

## 2023-04-20 DIAGNOSIS — M6281 Muscle weakness (generalized): Secondary | ICD-10-CM | POA: Insufficient documentation

## 2023-04-20 DIAGNOSIS — R278 Other lack of coordination: Secondary | ICD-10-CM | POA: Insufficient documentation

## 2023-04-20 NOTE — Therapy (Signed)
 OUTPATIENT PHYSICAL THERAPY FEMALE PELVIC EVALUATION   Patient Name: Wendy Jackson MRN: [id number] DOB:1945/11/30, 78 y.o., female Today's Date: 04/20/2023  END OF SESSION:  PT End of Session - 04/20/23 1444     Visit Number 1    Date for PT Re-Evaluation 07/13/23    Authorization Type [employer] medicare    Authorization - Visit Number 1    Authorization - Number of Visits 10    PT Start Time 1445    PT Stop Time 1525    PT Time Calculation (min) 40 min    Activity Tolerance Patient tolerated treatment well    Behavior During Therapy [facility] for tasks assessed/performed             Past Medical History:  Diagnosis Date   Cataract 03/2019   Glaucoma Approximately 2005   Horseshoe retinal tear of left eye 04/02/2021   Hyperlipidemia LDL goal <70 01/11/2021   Hypertension 2022   Long-term use of Plaquenil 04/02/2021   Lupus    new rheum questioning original dx. continuing Plaquenil at lower dose for now.   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 bladder, acquired    Pseudophakia of both eyes 04/02/2021   Retinal detachment with multiple breaks, right eye 04/02/2021   Stroke (HCC) 04/2020   Thyroid disease    Past Surgical History:  Procedure Laterality Date   ENDARTERECTOMY Right 05/02/2020   Procedure: RIGHT CAROTID ENDARTERECTOMY;  Surgeon: Larina Earthly, MD;  Location: [facility] OR;  Service: Vascular;  Laterality: Right;   EYE SURGERY  01/2020   LEFT HEART CATH AND CORONARY ANGIOGRAPHY N/A 02/03/2023   Procedure: LEFT HEART CATH AND CORONARY ANGIOGRAPHY;  Surgeon: Kathleene Hazel, MD;  Location: [facility] INVASIVE CV LAB;  Service: Cardiovascular;  Laterality: N/A;   SCLERAL BUCKLE Right 2004   TUBAL LIGATION  Feb 1982   Patient Active Problem List   Diagnosis Date Noted   Other specified abnormal immunological findings in serum 04/03/2023   Elevated erythrocyte sedimentation rate 04/03/2023   High coronary artery calcium score: 1525;  96th percentile 01/29/2023   SVT (supraventricular tachycardia) (HCC) 12/31/2022   Other fatigue 12/31/2022   Decreased exercise tolerance 12/31/2022   Polymyalgia rheumatica (HCC) 11/25/2022   Polyarthralgia 11/25/2022   Other secondary scoliosis, lumbar region 11/17/2022   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   Other female genital prolapse 10/29/2021   Immunization not carried out because of parent refusal 05/06/2021   Long-term use of Plaquenil 04/02/2021   Primary open angle glaucoma (POAG) of both eyes, severe stage 04/02/2021   Primary hypertension 01/11/2021   Hyperlipidemia LDL goal <70 01/11/2021   Acquired thrombophilia (HCC) 12/11/2020   BMI less than 19,adult 12/11/2020   Hypothyroidism 12/11/2020   History of CVA (cerebrovascular accident) 05/02/2020   Lupus    Allergic rhinitis    CAD (coronary artery disease) 05/20/2018    PCP: Natalia Leatherwood, DO  REFERRING PROVIDER: Napoleon Form, MD   REFERRING DIAG:  K58.1 (ICD-10-CM) - Irritable bowel syndrome with constipation  R15.9,R15.2 (ICD-10-CM) - Incontinence of feces with fecal urgency    THERAPY DIAG:  Muscle weakness (generalized) - Plan: PT plan of care cert/re-cert  Other lack of coordination - Plan: PT plan of care cert/re-cert  Rationale for Evaluation and Treatment: Rehabilitation  ONSET DATE: 1/25  SUBJECTIVE:  SUBJECTIVE STATEMENT: history of lupus and vaginal prolapse, presents with concerns about persistent bloating and feelings of fullness despite a low appetite. These symptoms have been ongoing for over a year and have not resolved. More stool leakage after she has a bowel movement.  Fluid intake:   PAIN:  Are you having pain? No  PRECAUTIONS: None  RED FLAGS: None   WEIGHT BEARING RESTRICTIONS:  No  FALLS:  Has patient fallen in last 6 months? No  OCCUPATION: retired  ACTIVITY LEVEL : tries to exercise, walks  PLOF: Independent  PATIENT GOALS: reduce the fecal leakage  PERTINENT HISTORY:  Glaucoma; Hypertension; Stroke, Thyroid;  Sexual abuse: No  BOWEL MOVEMENT: Pain with bowel movement: No Type of bowel movement:Type (Bristol Stool Scale) type 4, Frequency 1-2 days, Strain some straining, and Splinting none Fully empty rectum: Yes:   Leakage: Yes: right after a bowel movement Pads: Yes: 1 per day and uses tissue paper so she is not changing the pad as often Fiber supplement/laxative prebiotic fiber  URINATION: Pain with urination: No Fully empty bladder: No sometimes she does not; pessary of the prolapse Stream: Strong Urgency: sometimes mostly at night Frequency: every 2-3 hours; night 2-3 times Leakage: Urge to void and Walking to the bathroom Pads: Yes: 1 pad per night  INTERCOURSE: not active   PREGNANCY: Vaginal deliveries 2 Tearing Yes:   Episiotomy No  PROLAPSE: Patient is wearing a pessary    OBJECTIVE:  Note: Objective measures were completed at Evaluation unless otherwise noted.  DIAGNOSTIC FINDINGS:  None  PATIENT SURVEYS:  CRAIQ-7: 10 PFIQ-7: 6  COGNITION: Overall cognitive status: Within functional limits for tasks assessed     SENSATION: Light touch: Appears intact    GAIT: Assistive device utilized: None  POSTURE: rounded shoulders, forward head, increased thoracic kyphosis, and posterior pelvic tilt   LUMBARAROM/PROM:  A/PROM A/PROM  eval  Flexion full  Extension Decreased by 25%  Right lateral flexion Decreased by 25%  Left lateral flexion Decreased by 25%  Right rotation Decreased by 25%  Left rotation Decreased by 25%   (Blank rows = not tested)  LOWER EXTREMITY ROM: bilateral hip ROM is full   LOWER EXTREMITY MMT: bilateral hip strength is 4/5  PALPATION:    Abdominal: decreased lower abdominal  contractions                External Perineal Exam: the anus is open                             Internal Pelvic Floor: the puborectalis has difficulty to move forward  Patient confirms identification and approves PT to assess internal pelvic floor and treatment Yes  PELVIC MMT:   MMT eval  Internal Anal Sphincter 2/5, 5 s  External Anal Sphincter 2/5, 5 s  Puborectalis 2/5, 5 s  (Blank rows = not tested)        TONE: Low tone   TODAY'S TREATMENT:  DATE: 04/20/23  EVAL Examination completed, findings reviewed, pt educated on POC, HEP, and female pelvic floor anatomy, reasoning with pelvic floor assessment internally with pt consent, and abdominal massage. Pt motivated to participate in PT and agreeable to attempt recommendations.     PATIENT EDUCATION:  Education details: Access Code: [email or web address] Person educated: Patient Education method: Explanation, Demonstration, Tactile cues, Verbal cues, and Handouts Education comprehension: verbalized understanding, returned demonstration, verbal cues required, tactile cues required, and needs further education  HOME EXERCISE PROGRAM: 04/20/23 Access Code: [email or web address] URL: [email or web address] Date: 04/20/2023 Prepared by: Eulis Foster  Exercises - Sidelying Pelvic Floor Contraction with Self-Palpation  - 3 x daily - 7 x weekly - 1 sets - 10 reps - 5 sec hold  Patient Education - Abdominal Massage for Constipation - Abdominal Massage for Constipation  ASSESSMENT:  CLINICAL IMPRESSION: Patient is a 78 y.o. female who was seen today for physical therapy evaluation and treatment for IBS and fecal leakage. Patient reports her fecal leakage started in the beginning of the year. She will leak stool after she has a bowel movement. She has Type 4 bowel movements every other day. She will strain at times to  have a bowel movement.  Patients posture consists of increased in thoracic kyphosis, posteriorly tilted pelvis, decreased space between the rib cage and pubic bone. Rectal strength is 2/5 with low tone and holding for 5 sec. She has difficulty with bringing the puborectalis forward. Patient has tightness along the anococcygeal ligament, perineal body and external anal sphincter. Patient would benefit from skilled therapy to improve pelvic floor coordination and reduce fecal leakage.   OBJECTIVE IMPAIRMENTS: decreased endurance, decreased strength, and increased fascial restrictions.   ACTIVITY LIMITATIONS: continence and toileting  PARTICIPATION LIMITATIONS: community activity  PERSONAL FACTORS: Fitness, Time since onset of injury/illness/exacerbation, and 3+ comorbidities: Glaucoma; Hypertension; Stroke, Thyroid  are also affecting patient's functional outcome.   REHAB POTENTIAL: Good  CLINICAL DECISION MAKING: Evolving/moderate complexity  EVALUATION COMPLEXITY: Moderate   GOALS: Goals reviewed with patient? Yes  SHORT TERM GOALS: Target date: 05/18/23  Patient is independent with initial HEP.  Baseline: Goal status: INITIAL  2.  Patient is able to contract her pelvic floor for 10 seconds.  Baseline:  Goal status: INITIAL  3.  Patient understands how to perform abdominal massage to assist reduction of straining with bowel movements.  Baseline:  Goal status: INITIAL   LONG TERM GOALS: Target date: 07/13/23  Patient independent with advanced HEP for pelvic floor and core strength.  Baseline:  Goal status: INITIAL  2.  Patient reports her stool leakage decreased >/= 80% due to the puborectalis coming forward.  Baseline:  Goal status: INITIAL  3.  Rectal strength is >/= 3/5 with holding for 40 seconds to improve continence.  Baseline:  Goal status: INITIAL  4.  Patient is able to walk to the bathroom after the urge to have a bowel movement and have no stool leakage.   Baseline:  Goal status: INITIAL   PLAN:  PT FREQUENCY: 1-2x/week  PT DURATION: 12 weeks  PLANNED INTERVENTIONS: 97110-Therapeutic exercises, 97530- Therapeutic activity, 97112- Neuromuscular re-education, 97535- Self Care, 16109- Manual therapy, Patient/Family education, and Biofeedback  PLAN FOR NEXT SESSION: abdominal work, diaphragmatic breathing, core strength   Eulis Foster, PT 04/20/23 3:50 PM

## 2023-04-21 MED ORDER — VALSARTAN 160 MG PO TABS: ORAL_TABLET | ORAL | 2 refills | Status: DC

## 2023-04-21 NOTE — Addendum Note (Signed)
 Addended by: Cheree Ditto on: 04/21/2023 05:12 PM   Modules accepted: Orders

## 2023-04-27 ENCOUNTER — Ambulatory Visit: Payer: Medicare Other | Admitting: Physical Therapy

## 2023-04-27 ENCOUNTER — Encounter: Payer: Self-pay | Admitting: Physical Therapy

## 2023-04-27 DIAGNOSIS — M6281 Muscle weakness (generalized): Secondary | ICD-10-CM

## 2023-04-27 DIAGNOSIS — R278 Other lack of coordination: Secondary | ICD-10-CM

## 2023-04-27 NOTE — Therapy (Signed)
 OUTPATIENT PHYSICAL THERAPY FEMALE PELVIC TREATMENT   Patient Name: Wendy Jackson MRN: [id number] DOB:07-13-1945, 78 y.o., female Today's Date: 04/27/2023  END OF SESSION:  PT End of Session - 04/27/23 1017     Visit Number 2    Date for PT Re-Evaluation 07/13/23    Authorization Type [employer] medicare    Authorization - Visit Number 2    Authorization - Number of Visits 10    PT Start Time 1015    PT Stop Time 1055    PT Time Calculation (min) 40 min    Activity Tolerance Patient tolerated treatment well    Behavior During Therapy [facility] for tasks assessed/performed             Past Medical History:  Diagnosis Date   Cataract 03/2019   Glaucoma Approximately 2005   Horseshoe retinal tear of left eye 04/02/2021   Hyperlipidemia LDL goal <70 01/11/2021   Hypertension 2022   Long-term use of Plaquenil 04/02/2021   Lupus    new rheum questioning original dx. continuing Plaquenil at lower dose for now.   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 bladder, acquired    Pseudophakia of both eyes 04/02/2021   Retinal detachment with multiple breaks, right eye 04/02/2021   Stroke (HCC) 04/2020   Thyroid disease    Past Surgical History:  Procedure Laterality Date   ENDARTERECTOMY Right 05/02/2020   Procedure: RIGHT CAROTID ENDARTERECTOMY;  Surgeon: Larina Earthly, MD;  Location: [facility] OR;  Service: Vascular;  Laterality: Right;   EYE SURGERY  01/2020   LEFT HEART CATH AND CORONARY ANGIOGRAPHY N/A 02/03/2023   Procedure: LEFT HEART CATH AND CORONARY ANGIOGRAPHY;  Surgeon: Kathleene Hazel, MD;  Location: [facility] INVASIVE CV LAB;  Service: Cardiovascular;  Laterality: N/A;   SCLERAL BUCKLE Right 2004   TUBAL LIGATION  Feb 1982   Patient Active Problem List   Diagnosis Date Noted   Other specified abnormal immunological findings in serum 04/03/2023   Elevated erythrocyte sedimentation rate 04/03/2023   High coronary artery calcium score: 1525;  96th percentile 01/29/2023   SVT (supraventricular tachycardia) (HCC) 12/31/2022   Other fatigue 12/31/2022   Decreased exercise tolerance 12/31/2022   Polymyalgia rheumatica (HCC) 11/25/2022   Polyarthralgia 11/25/2022   Other secondary scoliosis, lumbar region 11/17/2022   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   Other female genital prolapse 10/29/2021   Immunization not carried out because of parent refusal 05/06/2021   Long-term use of Plaquenil 04/02/2021   Primary open angle glaucoma (POAG) of both eyes, severe stage 04/02/2021   Primary hypertension 01/11/2021   Hyperlipidemia LDL goal <70 01/11/2021   Acquired thrombophilia (HCC) 12/11/2020   BMI less than 19,adult 12/11/2020   Hypothyroidism 12/11/2020   History of CVA (cerebrovascular accident) 05/02/2020   Lupus    Allergic rhinitis    CAD (coronary artery disease) 05/20/2018    PCP: Natalia Leatherwood, DO  REFERRING PROVIDER: Napoleon Form, MD   REFERRING DIAG:  K58.1 (ICD-10-CM) - Irritable bowel syndrome with constipation  R15.9,R15.2 (ICD-10-CM) - Incontinence of feces with fecal urgency    THERAPY DIAG:  Muscle weakness (generalized)  Other lack of coordination  Rationale for Evaluation and Treatment: Rehabilitation  ONSET DATE: 1/25  SUBJECTIVE:  SUBJECTIVE STATEMENT: I have not had time to exercise. In the process of moving so will not be able to come till 6/3.    PAIN:  Are you having pain? No  PRECAUTIONS: None  RED FLAGS: None   WEIGHT BEARING RESTRICTIONS: No  FALLS:  Has patient fallen in last 6 months? No  OCCUPATION: retired  ACTIVITY LEVEL : tries to exercise, walks  PLOF: Independent  PATIENT GOALS: reduce the fecal leakage  PERTINENT HISTORY:  Glaucoma; Hypertension; Stroke, Thyroid;  Sexual  abuse: No  BOWEL MOVEMENT: Pain with bowel movement: No Type of bowel movement:Type (Bristol Stool Scale) type 4, Frequency 1-2 days, Strain some straining, and Splinting none Fully empty rectum: Yes:   Leakage: Yes: right after a bowel movement Pads: Yes: 1 per day and uses tissue paper so she is not changing the pad as often Fiber supplement/laxative prebiotic fiber  URINATION: Pain with urination: No Fully empty bladder: No sometimes she does not; pessary of the prolapse Stream: Strong Urgency: sometimes mostly at night Frequency: every 2-3 hours; night 2-3 times Leakage: Urge to void and Walking to the bathroom Pads: Yes: 1 pad per night  INTERCOURSE: not active   PREGNANCY: Vaginal deliveries 2 Tearing Yes:   Episiotomy No  PROLAPSE: Patient is wearing a pessary    OBJECTIVE:  Note: Objective measures were completed at Evaluation unless otherwise noted.  DIAGNOSTIC FINDINGS:  None  PATIENT SURVEYS:  CRAIQ-7: 10 PFIQ-7: 6  COGNITION: Overall cognitive status: Within functional limits for tasks assessed     SENSATION: Light touch: Appears intact    GAIT: Assistive device utilized: None  POSTURE: rounded shoulders, forward head, increased thoracic kyphosis, and posterior pelvic tilt   LUMBARAROM/PROM:  A/PROM A/PROM  eval  Flexion full  Extension Decreased by 25%  Right lateral flexion Decreased by 25%  Left lateral flexion Decreased by 25%  Right rotation Decreased by 25%  Left rotation Decreased by 25%   (Blank rows = not tested)  LOWER EXTREMITY ROM: bilateral hip ROM is full   LOWER EXTREMITY MMT: bilateral hip strength is 4/5  PALPATION:    Abdominal: decreased lower abdominal contractions                External Perineal Exam: the anus is open                             Internal Pelvic Floor: the puborectalis has difficulty to move forward  Patient confirms identification and approves PT to assess internal pelvic floor and  treatment Yes  PELVIC MMT:   MMT eval  Internal Anal Sphincter 2/5, 5 s  External Anal Sphincter 2/5, 5 s  Puborectalis 2/5, 5 s  (Blank rows = not tested)        TONE: Low tone   TODAY'S TREATMENT:   04/27/23 Manual: Soft tissue mobilization: Circular massage to the abdomen to promote peristalic motion of the intestines and educated patient on how to perform at home.  Scar tissue mobilization: Myofascial release: Fascial release for the lower abdomen to go through the restrictions Neuromuscular re-education: Pelvic floor contraction training: Supine ball squeeze holding 10 sec 10 x with therapist hand placed on the rectal area to feel the contraction Sitting ball squeeze holding 10 sec 10 x with pelvic floor contraction Sit to stand with pelvic floor contraction  Exercises: Strengthening: Nustep level 3 for 5 minutes while assessing patient and to build endurance  DATE: 04/20/23  EVAL Examination completed, findings reviewed, pt educated on POC, HEP, and female pelvic floor anatomy, reasoning with pelvic floor assessment internally with pt consent, and abdominal massage. Pt motivated to participate in PT and agreeable to attempt recommendations.     PATIENT EDUCATION:  04/27/23 Education details: Access Code: [email or web address] Person educated: Patient Education method: Explanation, Demonstration, Tactile cues, Verbal cues, and Handouts Education comprehension: verbalized understanding, returned demonstration, verbal cues required, tactile cues required, and needs further education  HOME EXERCISE PROGRAM: 04/27/23 Access Code: [email or web address] URL: [email or web address] Date: 04/27/2023 Prepared by: Marsha Skeen  Exercises - Sidelying Pelvic Floor Contraction with Self-Palpation  - 3 x daily - 7 x weekly - 1 sets - 10 reps - 5 sec hold - Supine Hip  Adduction Isometric with Ball  - 1 x daily - 7 x weekly - 1 sets - 10 reps - 10 sec hold - Seated Hip Adduction Isometrics with Ball  - 1 x daily - 7 x weekly - 1 sets - 10 reps - 1 sec hold - Sit to Stand with Pelvic Floor Contraction  - 1 x daily - 7 x weekly - 1 sets - 10 reps  Patient Education - Abdominal Massage for Constipation - Abdominal Massage for Constipation  ASSESSMENT:  CLINICAL IMPRESSION: Patient is a 78 y.o. female who was seen today for physical therapy  treatment for IBS and fecal leakage. Patient is in the process of moving and will be in her new place 06/22/23. She will resume therapy on 06/22/23. Today focused on getting patient on a HEP to strengthen her pelvic floor to work at home. Patient had reduction of swelling of the lower abdomen after the abdominal massage.  Patient would benefit from skilled therapy to improve pelvic floor coordination and reduce fecal leakage.   OBJECTIVE IMPAIRMENTS: decreased endurance, decreased strength, and increased fascial restrictions.   ACTIVITY LIMITATIONS: continence and toileting  PARTICIPATION LIMITATIONS: community activity  PERSONAL FACTORS: Fitness, Time since onset of injury/illness/exacerbation, and 3+ comorbidities: Glaucoma; Hypertension; Stroke, Thyroid  are also affecting patient's functional outcome.   REHAB POTENTIAL: Good  CLINICAL DECISION MAKING: Evolving/moderate complexity  EVALUATION COMPLEXITY: Moderate   GOALS: Goals reviewed with patient? Yes  SHORT TERM GOALS: Target date: 05/18/23  Patient is independent with initial HEP.  Baseline: Goal status: INITIAL  2.  Patient is able to contract her pelvic floor for 10 seconds.  Baseline:  Goal status: INITIAL  3.  Patient understands how to perform abdominal massage to assist reduction of straining with bowel movements.  Baseline:  Goal status: INITIAL   LONG TERM GOALS: Target date: 07/13/23  Patient independent with advanced HEP for pelvic floor  and core strength.  Baseline:  Goal status: INITIAL  2.  Patient reports her stool leakage decreased >/= 80% due to the puborectalis coming forward.  Baseline:  Goal status: INITIAL  3.  Rectal strength is >/= 3/5 with holding for 40 seconds to improve continence.  Baseline:  Goal status: INITIAL  4.  Patient is able to walk to the bathroom after the urge to have a bowel movement and have no stool leakage.  Baseline:  Goal status: INITIAL   PLAN:  PT FREQUENCY: 1-2x/week  PT DURATION: 12 weeks  PLANNED INTERVENTIONS: 97110-Therapeutic exercises, 97530- Therapeutic activity, [id number]- Neuromuscular re-education, 97535- Self Care, 16109- Manual therapy, Patient/Family education, and Biofeedback  PLAN FOR NEXT SESSION: abdominal work, diaphragmatic breathing, core strength, patient will resume therapy on 06/22/23.    Marsha Skeen, PT 04/27/23  10:59 AM

## 2023-04-28 DIAGNOSIS — H33021 Retinal detachment with multiple breaks, right eye: Secondary | ICD-10-CM | POA: Diagnosis not present

## 2023-04-28 DIAGNOSIS — H33312 Horseshoe tear of retina without detachment, left eye: Secondary | ICD-10-CM | POA: Diagnosis not present

## 2023-04-28 DIAGNOSIS — H35371 Puckering of macula, right eye: Secondary | ICD-10-CM | POA: Diagnosis not present

## 2023-04-28 DIAGNOSIS — Z79899 Other long term (current) drug therapy: Secondary | ICD-10-CM | POA: Diagnosis not present

## 2023-04-30 ENCOUNTER — Ambulatory Visit: Payer: Medicare Other

## 2023-04-30 ENCOUNTER — Encounter (HOSPITAL_COMMUNITY): Payer: Medicare Other

## 2023-04-30 NOTE — Telephone Encounter (Signed)
 Forward to pharmacist to respond

## 2023-05-04 ENCOUNTER — Encounter: Payer: Medicare Other | Admitting: Physical Therapy

## 2023-05-05 ENCOUNTER — Encounter: Payer: Self-pay | Admitting: Gastroenterology

## 2023-05-05 ENCOUNTER — Ambulatory Visit: Admitting: Gastroenterology

## 2023-05-05 VITALS — BP 154/60 | HR 72 | Ht 61.0 in | Wt 88.2 lb

## 2023-05-05 DIAGNOSIS — R152 Fecal urgency: Secondary | ICD-10-CM | POA: Insufficient documentation

## 2023-05-05 DIAGNOSIS — R159 Full incontinence of feces: Secondary | ICD-10-CM | POA: Diagnosis not present

## 2023-05-05 DIAGNOSIS — K219 Gastro-esophageal reflux disease without esophagitis: Secondary | ICD-10-CM | POA: Diagnosis not present

## 2023-05-05 DIAGNOSIS — R140 Abdominal distension (gaseous): Secondary | ICD-10-CM | POA: Insufficient documentation

## 2023-05-05 DIAGNOSIS — K2100 Gastro-esophageal reflux disease with esophagitis, without bleeding: Secondary | ICD-10-CM | POA: Insufficient documentation

## 2023-05-05 NOTE — Patient Instructions (Addendum)
 We have sent the following medications to your pharmacy for you to pick up at your convenience: Famotidine  20 mg daily.   IBgard samples provided use as directed on box.   Call back in 2-3 weeks to schedule with Dr. Nandigam for August 2025.  _______________________________________________________  If your blood pressure at your visit was 140/90 or greater, please contact your primary care physician to follow up on this.  _______________________________________________________  If you are age 28 or older, your body mass index should be between 23-30. Your Body mass index is 16.66 kg/m. If this is out of the aforementioned range listed, please consider follow up with your Primary Care Provider.  If you are age 76 or younger, your body mass index should be between 19-25. Your Body mass index is 16.66 kg/m. If this is out of the aformentioned range listed, please consider follow up with your Primary Care Provider.   ________________________________________________________  The [facility] providers would like to encourage you to use MYCHART to communicate with providers for non-urgent requests or questions.  Due to long hold times on the telephone, sending your provider a message by [facility] may be a faster and more efficient way to get a response.  Please allow 48 business hours for a response.  Please remember that this is for non-urgent requests.  _______________________________________________________

## 2023-05-05 NOTE — Progress Notes (Signed)
 05/05/2023 Joda Braatz [id number] February 27, 1945   HISTORY OF PRESENT ILLNESS: This is a pleasant 78 year old female who is a patient of Dr. Allean Aran, last seen by her in January 2025.  She was seen at that time for complaints of persistent abdominal bloating and fullness and fecal incontinence.  She was referred to pelvic floor physical therapy.  Patient tells me she did go there and she feels like they will be very helpful, but she had to put that on pause for now as they are in the process of trying to move.  She tried a lactose-free diet, but did not seem to make a difference so she is back to eating just her minimal cheese, etc. that she had eaten previously.  She tried the IBgard.  She thinks it may have helped.  She also added the Benefiber into her diet and thinks that is helping as well.  Complains of fatigue.  Her thyroid  levels are followed regularly and look good.  Hemoglobin is normal.  Vitamin D , B12, folate levels are normal, actually high levels.  No worsening of reflux symptoms after removing PPI and maintaining on Pepcid  20 mg daily.  EGD 03/17/2022 - Salmon- colored mucosa suspicious for Barrett' s esophagus. Biopsied (reflux esophagitis, negative for Barrett's).  - Normal stomach. Biopsied (mild gastritis, negative for H. Pylori).  - Normal examined duodenum. Biopsied (negative for celiac).   Colonoscopy 03/17/22 - Altered vascular, erythematous and friable ( with contact bleeding) mucosa in the rectum. Biopsied (no inflammation, normal mucosa).  - One less than 1 mm polyp at the hepatic flexure, removed with a cold biopsy forceps. Resected and retrieved (tubular adenoma).  - The examination was otherwise normal on direct and retroflexion views.   Past Medical History:  Diagnosis Date   Cataract 03/2019   Glaucoma Approximately 2005   Horseshoe retinal tear of left eye 04/02/2021   Hyperlipidemia LDL goal <70 01/11/2021   Hypertension 2022   Long-term use of Plaquenil   04/02/2021   Lupus    new rheum questioning original dx. continuing Plaquenil  at lower dose for now.   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 bladder, acquired    Pseudophakia of both eyes 04/02/2021   Retinal detachment with multiple breaks, right eye 04/02/2021   Stroke (HCC) 04/2020   Thyroid  disease    Past Surgical History:  Procedure Laterality Date   ENDARTERECTOMY Right 05/02/2020   Procedure: RIGHT CAROTID ENDARTERECTOMY;  Surgeon: Mayo Speck, MD;  Location: [facility] OR;  Service: Vascular;  Laterality: Right;   EYE SURGERY  01/2020   LEFT HEART CATH AND CORONARY ANGIOGRAPHY N/A 02/03/2023   Procedure: LEFT HEART CATH AND CORONARY ANGIOGRAPHY;  Surgeon: Odie Benne, MD;  Location: [facility];  Service: Cardiovascular;  Laterality: N/A;   SCLERAL BUCKLE Right 2004   TUBAL LIGATION  Feb 1982    reports that she has never smoked. She has never been exposed to tobacco smoke. She has never used smokeless tobacco. She reports that she does not drink alcohol and does not use drugs. family history includes ALS in her mother; Alcohol abuse in her paternal uncle and paternal uncle; Dementia in her father; Early death in her mother; Hypertension in her father; Macular degeneration in her maternal aunt. Allergies  Allergen Reactions   Amlodipine  Other (See Comments)    Bilateral lower extremity edema   Augmentin [Amoxicillin-Pot Clavulanate] Other (See Comments)    unk  Tramadol     Nausea    Flexeril [Cyclobenzaprine] Palpitations      Outpatient Encounter Medications as of 05/05/2023  Medication Sig   ARMOUR THYROID  30 MG tablet Take 1 tablet (30 mg total) by mouth daily.   aspirin  EC 81 MG tablet Take 1 tablet (81 mg total) by mouth daily. Swallow whole.   cetirizine (ZYRTEC) 10 MG tablet Take 10 mg by mouth See admin instructions. Every 36 hours   estradiol  (ESTRACE ) 0.1 MG/GM vaginal cream Use 1/2 gram three times per  week at night.   famotidine  (PEPCID ) 20 MG tablet Take 1 tablet (20 mg total) by mouth daily.   furosemide  (LASIX ) 40 MG tablet Take 40 mg by mouth daily as needed for fluid or edema.   hydrochlorothiazide  (HYDRODIURIL ) 25 MG tablet Take 1 tablet (25 mg total) by mouth daily.   hydroxychloroquine  (PLAQUENIL ) 200 MG tablet Take 200 mg by mouth every other day.   latanoprost (XALATAN) 0.005 % ophthalmic solution Place 1 drop into the left eye at bedtime.   Misc Natural Products (AIRBORNE ELDERBERRY) CHEW Chew 1 tablet by mouth in the morning and at bedtime.   predniSONE  (DELTASONE ) 5 MG tablet 3 tablets once a day for 7 days, 2.5 tablets once a day for 7 days, 2 tablets once a day for 7 days, 1.5 tablets once a day for 7 days, 1 tablet once a day for 7 days, 0.5 tablet once a day for 8 days Orally for 43 days   Probiotic Product (UP4 PROBIOTICS WOMENS PO) Take 1 capsule by mouth daily.   Resveratrol 50 MG CAPS Take 50 mg by mouth daily.   rosuvastatin  (CRESTOR ) 40 MG tablet Take 1 tablet (40 mg total) by mouth daily.   SIMBRINZA 1-0.2 % SUSP Place 1 drop into the left eye 3 (three) times daily.   Sodium Chloride -Xylitol (XLEAR SINUS CARE SPRAY NA) Place 4 sprays into the nose daily as needed (Moisturing).   Ubiquinol 100 MG CAPS Take 100 mg by mouth.   valsartan  (DIOVAN ) 160 MG tablet Take 1 tablet by mouth every evening   verapamil  (CALAN -SR) 120 MG CR tablet Take 1 tablet (120 mg total) by mouth at bedtime.   No facility-administered encounter medications on file as of 05/05/2023.     REVIEW OF SYSTEMS  : All other systems reviewed and negative except where noted in the History of Present Illness.   PHYSICAL EXAM: BP (!) 154/60 (BP Location: Left Arm, Patient Position: Sitting, Cuff Size: Normal)   Pulse 72   Ht 5\' 1"  (1.549 m) Comment: height measured without shoes  Wt 88 lb 3 oz (40 kg)   BMI 16.66 kg/m  General: Well developed white female in no acute distress Head: Normocephalic  and atraumatic Eyes:  sclerae anicteric,conjunctive pink. Ears: Normal auditory acuity Neck: Supple, no masses.  Lungs: Clear throughout to auscultation Heart: Regular rate and rhythm Abdomen: Soft, nontender, non distended. No masses or hepatomegaly noted. Normal bowel sounds. Musculoskeletal: Symmetrical with no gross deformities  Skin: No lesions on visible extremities Extremities: No edema  Neurological: Alert oriented x 4, grossly non-focal Psychological:  Alert and cooperative. Normal mood and affect  ASSESSMENT AND PLAN: Gastroesophageal Reflux Disease (GERD) History of endoscopy showing acid reflux related damage. No Barrett's esophagus on biopsy. Patient reported no symptomatic improvement with Pantoprazole .  It was recommended that she discontinue daily Pantoprazole , use as needed for heartburn, severe bloating and start low dose acid reducer like Pepcid  20mg  daily.   -  Seems to be doing fine with the pepcid .  Will continue for now.  New prescription sent to pharmacy.   Chronic Bloating Ongoing for over a year. No significant improvement with gluten-free diet. Possible lactose intolerance or constipation related. -Tried lactose free diet without improvement. -Added Benefiber and that has helped some. -Trial of peppermint oil (Ibgard) up to 3 times a day.  More samples given today.   Fecal Incontinence Reports occasional leakage, possibly related to vaginal prolapse and weakened pelvic floor. -She did see Marsha Skeen for pelvic floor PT and believes that she can help her.  She gave her some home exercises to do as well although as she is in the process of moving so put the therapy sessions on hold for now.  She has plan to go back to see her, however.  *Will follow-up again in 3 to 4 months.   CC:  Kuneff, Renee A, DO

## 2023-05-11 ENCOUNTER — Encounter: Payer: Medicare Other | Admitting: Physical Therapy

## 2023-05-12 ENCOUNTER — Encounter: Payer: Self-pay | Admitting: Family Medicine

## 2023-05-12 ENCOUNTER — Ambulatory Visit (INDEPENDENT_AMBULATORY_CARE_PROVIDER_SITE_OTHER): Payer: Medicare Other | Admitting: Family Medicine

## 2023-05-12 VITALS — BP 140/68 | HR 66 | Temp 97.9°F | Ht 61.0 in | Wt 88.8 lb

## 2023-05-12 DIAGNOSIS — Z79899 Other long term (current) drug therapy: Secondary | ICD-10-CM | POA: Diagnosis not present

## 2023-05-12 DIAGNOSIS — E785 Hyperlipidemia, unspecified: Secondary | ICD-10-CM

## 2023-05-12 DIAGNOSIS — I10 Essential (primary) hypertension: Secondary | ICD-10-CM

## 2023-05-12 DIAGNOSIS — M353 Polymyalgia rheumatica: Secondary | ICD-10-CM

## 2023-05-12 DIAGNOSIS — I25119 Atherosclerotic heart disease of native coronary artery with unspecified angina pectoris: Secondary | ICD-10-CM

## 2023-05-12 DIAGNOSIS — R931 Abnormal findings on diagnostic imaging of heart and coronary circulation: Secondary | ICD-10-CM

## 2023-05-12 MED ORDER — HYDROCHLOROTHIAZIDE 25 MG PO TABS: 25.0000 mg | ORAL_TABLET | Freq: Every day | ORAL | 3 refills | Status: DC

## 2023-05-12 MED ORDER — ROSUVASTATIN CALCIUM 40 MG PO TABS: 40.0000 mg | ORAL_TABLET | Freq: Every day | ORAL | 3 refills | Status: DC

## 2023-05-12 MED ORDER — VERAPAMIL HCL ER 120 MG PO TBCR: 120.0000 mg | EXTENDED_RELEASE_TABLET | Freq: Every day | ORAL | 1 refills | Status: DC

## 2023-05-12 NOTE — Patient Instructions (Signed)

## 2023-05-12 NOTE — Progress Notes (Signed)
 Wendy Jackson , [birth year], 78 y.o., female MRN: [id number] Patient Care Team    Relationship Specialty Notifications Start End  Mariel Shope, DO PCP - General Family Medicine  12/11/20   Eilleen Grates, MD PCP - Cardiology Cardiology  01/01/23   Johny Nap, NP Nurse Practitioner Neurology  05/23/20   Ma Saupe, MD Referring Physician Ophthalmology  05/23/20   Dr. Gerre Kraft Attending Physician Rheumatology  12/10/20    Comment: [place], [place]  Greta Leatherwood, MD Consulting Physician Obstetrics and Gynecology  11/11/21     Chief Complaint  Patient presents with   Medical Management of Chronic Issues     Subjective: Wendy Jackson is a 78 y.o. Pt presents for  Hypertension/hyperlipidemia/lower extremity edema: Pt reports compliance with verapamil  120 mg daily, HCTZ 25 mg dail. losartan  100 mg daily changed to valsartan  160 by cardiology.   Patient denies chest pain, shortness of breath, dizziness or lower extremity edema.  She is now established with cardiology.  Polyarthralgia/poss PMR: Now established with local rheumatology.  Restarted on low-dose prednisone  and Plaquenil  dose was cut in half. Prior note Patient has a history of rheumatological disease going back a few decades.  She had been prescribed low-dose steroid daily for some time and then switched over to Plaquenil .  She has a new rheumatologist now, and the diagnosis of lupus is in question.  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 Patient recently was seen by orthopedic for hip discomfort and given a prednisone  taper.  She reports the swelling in her feet had also completely resolved along with other muscle skeletal complaints with use of steroid. Today she would like a referral to a local rheumatologist to discuss their opinion on her rheumatological condition, evaluation and treatment     05/12/2023    1:12 PM 09/02/2022    8:42 AM 06/23/2022    2:30 PM  05/06/2022   12:51 PM 06/12/2021    1:56 PM  Depression screen PHQ 2/9  Decreased Interest 0 0 0 0 0  Down, Depressed, Hopeless 0 0 0 0 0  PHQ - 2 Score 0 0 0 0 0    Allergies  Allergen Reactions   Amlodipine  Other (See Comments)    Bilateral lower extremity edema   Augmentin [Amoxicillin-Pot Clavulanate] Other (See Comments)    unk   Tramadol     Nausea    Flexeril [Cyclobenzaprine] Palpitations   Social History   Social History Narrative   Marital status/children/pets: Married.  2 children.   Education/employment: Retired.  College-educated.   Safety:      -smoke alarm in the home:Yes     - wears seatbelt: Yes     - Feels safe in their relationships: Yes      Past Medical History:  Diagnosis Date   Cataract 03/2019   Glaucoma Approximately 2005   Horseshoe retinal tear of left eye 04/02/2021   Hyperlipidemia LDL goal <70 01/11/2021   Hypertension 2022   Long-term use of Plaquenil  04/02/2021   Lupus    new rheum questioning original dx. continuing Plaquenil  at lower dose for now.   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 bladder, acquired    Pseudophakia of both eyes 04/02/2021   Retinal detachment with multiple breaks, right eye 04/02/2021   Stroke (HCC) 04/2020   Thyroid  disease    Past Surgical History:  Procedure  Laterality Date   ENDARTERECTOMY Right 05/02/2020   Procedure: RIGHT CAROTID ENDARTERECTOMY;  Surgeon: Mayo Speck, MD;  [facility]: [facility];  Service: Vascular;  Laterality: Right;   EYE SURGERY  01/2020   LEFT HEART CATH AND CORONARY ANGIOGRAPHY N/A 02/03/2023   Procedure: LEFT HEART CATH AND CORONARY ANGIOGRAPHY;  Surgeon: Odie Benne, MD;  Location: [facility];  Service: Cardiovascular;  Laterality: N/A;   SCLERAL BUCKLE Right 2004   TUBAL LIGATION  Feb 1982   Family History  Problem Relation Age of Onset   ALS Mother    Early death Mother    Dementia Father    Hypertension Father     Macular degeneration Maternal Aunt    Alcohol abuse Paternal Uncle    Alcohol abuse Paternal Uncle    Allergies as of 05/12/2023       Reactions   Amlodipine  Other (See Comments)   Bilateral lower extremity edema   Augmentin [amoxicillin-pot Clavulanate] Other (See Comments)   unk   Tramadol    Nausea   Flexeril [cyclobenzaprine] Palpitations        Medication List        Accurate as of May 12, 2023 11:59 PM. If you have any questions, ask your nurse or doctor.          Airborne Elderberry 100-50 MG Chew Chew 1 tablet by mouth in the morning and at bedtime.   Armour Thyroid  30 MG tablet Generic drug: thyroid  Take 1 tablet (30 mg total) by mouth daily.   aspirin  EC 81 MG tablet Take 1 tablet (81 mg total) by mouth daily. Swallow whole.   cetirizine 10 MG tablet Commonly known as: ZYRTEC Take 10 mg by mouth See admin instructions. Every 36 hours   estradiol  0.1 MG/GM vaginal cream Commonly known as: ESTRACE  Use 1/2 gram three times per week at night.   famotidine  20 MG tablet Commonly known as: Pepcid  Take 1 tablet (20 mg total) by mouth daily.   furosemide  40 MG tablet Commonly known as: LASIX  Take 40 mg by mouth daily as needed for fluid or edema.   hydrochlorothiazide  25 MG tablet Commonly known as: HYDRODIURIL  Take 1 tablet (25 mg total) by mouth daily.   hydroxychloroquine  200 MG tablet Commonly known as: PLAQUENIL  Take 200 mg by mouth every other day.   latanoprost 0.005 % ophthalmic solution Commonly known as: XALATAN Place 1 drop into the left eye at bedtime.   predniSONE  5 MG tablet Commonly known as: DELTASONE  3 tablets once a day for 7 days, 2.5 tablets once a day for 7 days, 2 tablets once a day for 7 days, 1.5 tablets once a day for 7 days, 1 tablet once a day for 7 days, 0.5 tablet once a day for 8 days Orally for 43 days   Resveratrol 50 MG Caps Take 50 mg by mouth daily.   rosuvastatin  40 MG tablet Commonly known as:  Crestor  Take 1 tablet (40 mg total) by mouth daily.   Simbrinza 1-0.2 % Susp Generic drug: Brinzolamide-Brimonidine Place 1 drop into the left eye 3 (three) times daily.   Ubiquinol 100 MG Caps Take 100 mg by mouth.   UP4 PROBIOTICS WOMENS PO Take 1 capsule by mouth daily.   valsartan  160 MG tablet Commonly known as: DIOVAN  Take 1 tablet by mouth every evening   verapamil  120 MG CR tablet Commonly known as: CALAN -SR Take 1 tablet (120 mg total) by mouth at bedtime.   XLEAR SINUS  CARE SPRAY NA Place 4 sprays into the nose daily as needed (Moisturing).        All past medical history, surgical history, allergies, family history, immunizations andmedications were updated in the EMR today and reviewed under the history and medication portions of their EMR.     ROS Negative, with the exception of above mentioned in HPI   Objective:  BP (!) 140/68   Pulse 66   Temp 97.9 F (36.6 C) (Oral)   Ht 5\' 1"  (1.549 m)   Wt 88 lb 12.8 oz (40.3 kg)   SpO2 99%   BMI 16.78 kg/m  Body mass index is 16.78 kg/m. Physical Exam Vitals and nursing note reviewed.  Constitutional:      General: She is not in acute distress.    Appearance: Normal appearance. She is not ill-appearing, toxic-appearing or diaphoretic.  HENT:     Head: Normocephalic and atraumatic.  Eyes:     General: No scleral icterus.       Right eye: No discharge.        Left eye: No discharge.     Extraocular Movements: Extraocular movements intact.     Conjunctiva/sclera: Conjunctivae normal.     Pupils: Pupils are equal, round, and reactive to light.  Cardiovascular:     Rate and Rhythm: Normal rate and regular rhythm.     Heart sounds: No murmur heard. Pulmonary:     Effort: Pulmonary effort is normal. No respiratory distress.     Breath sounds: Normal breath sounds. No wheezing, rhonchi or rales.  Musculoskeletal:     Right lower leg: No edema.     Left lower leg: No edema.  Skin:    General: Skin is  warm.     Findings: No rash.  Neurological:     Mental Status: She is alert and oriented to person, place, and time. Mental status is at baseline.     Motor: No weakness.     Gait: Gait normal.  Psychiatric:        Mood and Affect: Mood normal.        Behavior: Behavior normal.        Thought Content: Thought content normal.        Judgment: Judgment normal.     No results found. No results found.  No results found for this or any previous visit (from the past 24 hours).  Assessment/Plan: Gracely Kubina is a 77 y.o. female present for OV for Chronic Conditions/illness Management Primary hypertension/LE edema/HLD Borderline.  Has cardiology follow-up scheduled.  They are working on blood pressure control and have recently changed ARB.  Return to steroid use likely bumped up blood pressure some. Continue losartan  100 mg daily changed to valsartan  160 mg Continue hydrochlorothiazide  25 mg qd Will use lasix  in place of hydrochlorothiazide  if needed only for edema.  Continue verapamil  120 mg every day> higher doses caused lower extremity edema. Now established with cardio, Dr. Lavonne Prairie.  Coronary calcium  score: 1525, 96th percentile Continue Crestor  40 mg  Polyarthralgia/PMR established with rheumatology decreased Plaquenil  started steroid Patient is having continued generalized weakness, fatigue, myalgias and arthralgia.  She was prescribed Plaquenil  by rheumatology for what was presumed to be lupus in the past.  There is some question if diagnosis is truly lupus versus PMR. Ironically the remainder of her swelling in her feet resolved with prednisone  orthopedics prescribed for hip pain.  Which could be leading more towards a PMR diagnoses. History of ANA positive per patient's prior  rheumatologist Marqu[facility] Sit [facility] in [place] ),.ANA completed here in November 2024 was negative with negative reflex panel.  Normal CCP.  Negative rheumatoid factor.  Normal sed rate.  Vitamin D  and B12  not deficient. Patient now established with local rheumatology  - Placed back on low-dose steroid prednisone  5 mg daily   - Plaquenil  dose was reduced to every other day   Reviewed expectations re: course of current medical issues. Discussed self-management of symptoms. Outlined signs and symptoms indicating need for more acute intervention. Patient verbalized understanding and all questions were answered. Patient received an After-Visit Summary.  Return in about 24 weeks (around 10/27/2023).   No orders of the defined types were placed in this encounter.  Meds ordered this encounter  Medications   hydrochlorothiazide  (HYDRODIURIL ) 25 MG tablet    Sig: Take 1 tablet (25 mg total) by mouth daily.    Dispense:  90 tablet    Refill:  3   rosuvastatin  (CRESTOR ) 40 MG tablet    Sig: Take 1 tablet (40 mg total) by mouth daily.    Dispense:  90 tablet    Refill:  3   verapamil  (CALAN -SR) 120 MG CR tablet    Sig: Take 1 tablet (120 mg total) by mouth at bedtime.    Dispense:  90 tablet    Refill:  1   Referral Orders  No referral(s) requested today      Note is dictated utilizing voice recognition software. Although note has been proof read prior to signing, occasional typographical errors still can be missed. If any questions arise, please do not hesitate to call for verification.   electronically signed by:  Napolean Backbone, DO  [facility] [place]

## 2023-05-15 ENCOUNTER — Encounter (HOSPITAL_COMMUNITY)

## 2023-05-15 ENCOUNTER — Ambulatory Visit

## 2023-05-22 NOTE — Telephone Encounter (Signed)
 Spoke with patient, scheduled follow up appointment with Dr Lavonne Prairie on 07/16/23 at 9:40 am. Patient aware this will be at our new location on [facility]. No needs at this time

## 2023-05-25 ENCOUNTER — Encounter: Admitting: Physical Therapy

## 2023-05-28 DIAGNOSIS — R700 Elevated erythrocyte sedimentation rate: Secondary | ICD-10-CM | POA: Diagnosis not present

## 2023-05-28 DIAGNOSIS — R768 Other specified abnormal immunological findings in serum: Secondary | ICD-10-CM | POA: Diagnosis not present

## 2023-05-28 DIAGNOSIS — M353 Polymyalgia rheumatica: Secondary | ICD-10-CM | POA: Diagnosis not present

## 2023-05-28 DIAGNOSIS — R5383 Other fatigue: Secondary | ICD-10-CM | POA: Diagnosis not present

## 2023-06-08 ENCOUNTER — Encounter: Payer: Self-pay | Admitting: Gastroenterology

## 2023-06-08 DIAGNOSIS — K2100 Gastro-esophageal reflux disease with esophagitis, without bleeding: Secondary | ICD-10-CM

## 2023-06-09 ENCOUNTER — Ambulatory Visit: Admitting: Pharmacist

## 2023-06-10 MED ORDER — PANTOPRAZOLE SODIUM 40 MG PO TBEC: 40.0000 mg | DELAYED_RELEASE_TABLET | Freq: Every day | ORAL | 3 refills | Status: AC

## 2023-06-12 ENCOUNTER — Other Ambulatory Visit: Payer: Self-pay | Admitting: Vascular Surgery

## 2023-06-12 DIAGNOSIS — I6523 Occlusion and stenosis of bilateral carotid arteries: Secondary | ICD-10-CM

## 2023-06-12 DIAGNOSIS — I63231 Cerebral infarction due to unspecified occlusion or stenosis of right carotid arteries: Secondary | ICD-10-CM

## 2023-06-18 ENCOUNTER — Other Ambulatory Visit: Payer: Self-pay | Admitting: Cardiology

## 2023-06-18 DIAGNOSIS — I10 Essential (primary) hypertension: Secondary | ICD-10-CM

## 2023-06-22 ENCOUNTER — Ambulatory Visit: Attending: Gastroenterology | Admitting: Physical Therapy

## 2023-06-22 ENCOUNTER — Encounter: Payer: Self-pay | Admitting: Physical Therapy

## 2023-06-22 DIAGNOSIS — M6281 Muscle weakness (generalized): Secondary | ICD-10-CM | POA: Diagnosis not present

## 2023-06-22 DIAGNOSIS — R278 Other lack of coordination: Secondary | ICD-10-CM | POA: Diagnosis not present

## 2023-06-22 NOTE — Therapy (Signed)
 OUTPATIENT PHYSICAL THERAPY FEMALE PELVIC TREATMENT   Patient Name: Wendy Jackson MRN: [id number] DOB:10/27/1945, 78 y.o., female Today's Date: 06/22/2023  END OF SESSION:  PT End of Session - 06/22/23 1149     Visit Number 3    Date for PT Re-Evaluation 07/13/23    Authorization Type [employer] medicare    Authorization - Visit Number 3    Authorization - Number of Visits 10    PT Start Time 1145    PT Stop Time 1225    PT Time Calculation (min) 40 min    Activity Tolerance Patient tolerated treatment well    Behavior During Therapy [facility] for tasks assessed/performed             Past Medical History:  Diagnosis Date   Cataract 03/2019   Glaucoma Approximately 2005   Horseshoe retinal tear of left eye 04/02/2021   Hyperlipidemia LDL goal <70 01/11/2021   Hypertension 2022   Long-term use of Plaquenil  04/02/2021   Lupus    new rheum questioning original dx. continuing Plaquenil  at lower dose for now.   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 bladder, acquired    Pseudophakia of both eyes 04/02/2021   Retinal detachment with multiple breaks, right eye 04/02/2021   Stroke (HCC) 04/2020   Thyroid  disease    Past Surgical History:  Procedure Laterality Date   ENDARTERECTOMY Right 05/02/2020   Procedure: RIGHT CAROTID ENDARTERECTOMY;  Surgeon: Mayo Speck, MD;  Location: [facility] OR;  Service: Vascular;  Laterality: Right;   EYE SURGERY  01/2020   LEFT HEART CATH AND CORONARY ANGIOGRAPHY N/A 02/03/2023   Procedure: LEFT HEART CATH AND CORONARY ANGIOGRAPHY;  Surgeon: Odie Benne, MD;  Location: [facility] INVASIVE CV LAB;  Service: Cardiovascular;  Laterality: N/A;   SCLERAL BUCKLE Right 2004   TUBAL LIGATION  Feb 1982   Patient Active Problem List   Diagnosis Date Noted   Incontinence of feces with fecal urgency 05/05/2023   Gastroesophageal reflux disease with esophagitis without hemorrhage 05/05/2023   Bloating 05/05/2023   Other  specified abnormal immunological findings in serum 04/03/2023   Elevated erythrocyte sedimentation rate 04/03/2023   High coronary artery calcium  score: 1525; 96th percentile 01/29/2023   SVT (supraventricular tachycardia) (HCC) 12/31/2022   Other fatigue 12/31/2022   Decreased exercise tolerance 12/31/2022   Polymyalgia rheumatica (HCC) 11/25/2022   Polyarthralgia 11/25/2022   Other secondary scoliosis, lumbar region 11/17/2022   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   Other female genital prolapse 10/29/2021   Immunization not carried out because of parent refusal 05/06/2021   Long-term use of Plaquenil  04/02/2021   Primary open angle glaucoma (POAG) of both eyes, severe stage 04/02/2021   Primary hypertension 01/11/2021   Hyperlipidemia LDL goal <70 01/11/2021   Acquired thrombophilia (HCC) 12/11/2020   BMI less than 19,adult 12/11/2020   Hypothyroidism 12/11/2020   History of CVA (cerebrovascular accident) 05/02/2020   Lupus    Allergic rhinitis    CAD (coronary artery disease) 05/20/2018    PCP: Mariel Shope, DO  REFERRING PROVIDER: Sergio Dandy, MD   REFERRING DIAG:  K58.1 (ICD-10-CM) - Irritable bowel syndrome with constipation  R15.9,R15.2 (ICD-10-CM) - Incontinence of feces with fecal urgency    THERAPY DIAG:  Muscle weakness (generalized)  Other lack of coordination  Rationale for Evaluation and Treatment: Rehabilitation  ONSET DATE: 1/25  SUBJECTIVE:  SUBJECTIVE STATEMENT: I am moved in and have not done my exercises and will start now. I was constipated and took a lot of fiber and then had leakage.   PAIN:  Are you having pain? No  PRECAUTIONS: None  RED FLAGS: None   WEIGHT BEARING RESTRICTIONS: No  FALLS:  Has patient fallen in last 6 months? No  OCCUPATION:  retired  ACTIVITY LEVEL : tries to exercise, walks  PLOF: Independent  PATIENT GOALS: reduce the fecal leakage  PERTINENT HISTORY:  Glaucoma; Hypertension; Stroke, Thyroid ;  Sexual abuse: No  BOWEL MOVEMENT: Pain with bowel movement: No Type of bowel movement:Type (Bristol Stool Scale) type 4, Frequency 1-2 days, Strain some straining, and Splinting none Fully empty rectum: Yes:   Leakage: Yes: right after a bowel movement Pads: Yes: 1 per day and uses tissue paper so she is not changing the pad as often Fiber supplement/laxative prebiotic fiber  URINATION: Pain with urination: No Fully empty bladder: No sometimes she does not; pessary of the prolapse Stream: Strong Urgency: sometimes mostly at night Frequency: every 2-3 hours; night 2-3 times Leakage: Urge to void and Walking to the bathroom Pads: Yes: 1 pad per night  INTERCOURSE: not active   PREGNANCY: Vaginal deliveries 2 Tearing Yes:   Episiotomy No  PROLAPSE: Patient is wearing a pessary    OBJECTIVE:  Note: Objective measures were completed at Evaluation unless otherwise noted.  DIAGNOSTIC FINDINGS:  None  PATIENT SURVEYS:  CRAIQ-7: 10 PFIQ-7: 6  COGNITION: Overall cognitive status: Within functional limits for tasks assessed     SENSATION: Light touch: Appears intact    GAIT: Assistive device utilized: None  POSTURE: rounded shoulders, forward head, increased thoracic kyphosis, and posterior pelvic tilt   LUMBARAROM/PROM:  A/PROM A/PROM  eval  Flexion full  Extension Decreased by 25%  Right lateral flexion Decreased by 25%  Left lateral flexion Decreased by 25%  Right rotation Decreased by 25%  Left rotation Decreased by 25%   (Blank rows = not tested)  LOWER EXTREMITY ROM: bilateral hip ROM is full   LOWER EXTREMITY MMT: bilateral hip strength is 4/5  PALPATION:    Abdominal: decreased lower abdominal contractions                External Perineal Exam: the anus is open                              Internal Pelvic Floor: the puborectalis has difficulty to move forward  Patient confirms identification and approves PT to assess internal pelvic floor and treatment Yes  PELVIC MMT:   MMT eval 06/22/23  Internal Anal Sphincter 2/5, 5 s 3/5 weak hug of therapist finger, 6 sec 3 x  External Anal Sphincter 2/5, 5 s 3/5 weak hug of therapist finger, 6 sec 3 x  Puborectalis 2/5, 5 s 3/5 weak hug of therapist finger, 6 sec 3 x  (Blank rows = not tested)        TONE: Low tone   TODAY'S TREATMENT:   06/22/23 Manual: Myofascial release: Fascial release around the perineal body going through the restrictions Internal pelvic floor techniques: No emotional/communication barriers or cognitive limitation. Patient is motivated to learn. Patient understands and agrees with treatment goals and plan. PT explains patient will be examined in standing, sitting, and lying down to see how their muscles and joints work. When they are ready, they will be asked to remove their underwear so  PT can examine their perineum. The patient is also given the option of providing their own chaperone as one is not provided in our facility. The patient also has the right and is explained the right to defer or refuse any part of the evaluation or treatment including the internal exam. With the patient's consent, PT will use one gloved finger to gently assess the muscles of the pelvic floor, seeing how well it contracts and relaxes and if there is muscle symmetry. After, the patient will get dressed and PT and patient will discuss exam findings and plan of care. PT and patient discuss plan of care, schedule, attendance policy and HEP activities.  Therapist gloved finger in the rectum working on the puborectalis, distraction of the coccyx, manual work to the anococcygeal ligament Neuromuscular re-education: Pelvic floor contraction training: Therapist gloved finger in the rectum taping the puborectalis to  assist with it coming forward, then contract with therapist trying to take her finger out of the canal, 10 quick contractions, 10 contractions holding for 6 seconds Sitting ball squeeze holding 1 sec 10 x with pelvic floor contraction Sit to stand with pelvic floor contraction 10 x    04/27/23 Manual: Soft tissue mobilization: Circular massage to the abdomen to promote peristalic motion of the intestines and educated patient on how to perform at home.  Scar tissue mobilization: Myofascial release: Fascial release for the lower abdomen to go through the restrictions Neuromuscular re-education: Pelvic floor contraction training: Supine ball squeeze holding 10 sec 10 x with therapist hand placed on the rectal area to feel the contraction Sitting ball squeeze holding 10 sec 10 x with pelvic floor contraction Sit to stand with pelvic floor contraction  Exercises: Strengthening: Nustep level 3 for 5 minutes while assessing patient and to build endurance                                                                                                                               DATE: 04/20/23  EVAL Examination completed, findings reviewed, pt educated on POC, HEP, and female pelvic floor anatomy, reasoning with pelvic floor assessment internally with pt consent, and abdominal massage. Pt motivated to participate in PT and agreeable to attempt recommendations.     PATIENT EDUCATION:  06/22/23 Education details: Access Code: [email or web address] Person educated: Patient Education method: Explanation, Demonstration, Tactile cues, Verbal cues, and Handouts Education comprehension: verbalized understanding, returned demonstration, verbal cues required, tactile cues required, and needs further education  HOME EXERCISE PROGRAM: 06/22/23 Access Code: [email or web address] URL: [email or web address] Date: 04/27/2023 Prepared by: Marsha Skeen  Exercises - Sidelying Pelvic Floor Contraction with Self-Palpation   - 3 x daily - 7 x weekly - 1 sets - 10 reps - 5 sec hold - Supine Hip Adduction Isometric with Ball  - 1 x daily - 7 x weekly - 1 sets - 10 reps - 10 sec hold - Seated Hip Adduction Isometrics with Ball  - 1 x daily -  7 x weekly - 1 sets - 10 reps - 1 sec hold - Sit to Stand with Pelvic Floor Contraction  - 1 x daily - 7 x weekly - 1 sets - 10 reps  Patient Education - Abdominal Massage for Constipation - Abdominal Massage for Constipation  ASSESSMENT:  CLINICAL IMPRESSION: Patient is a 78 y.o. female who was seen today for physical therapy  treatment for IBS and fecal leakage.   Pelvic floor strength is 3/5. She was able to pull the puborectalis forward. Patient has finished moving and is able to focus on pelvic floor exercises. Patient would benefit from skilled therapy to improve pelvic floor coordination and reduce fecal leakage.   OBJECTIVE IMPAIRMENTS: decreased endurance, decreased strength, and increased fascial restrictions.   ACTIVITY LIMITATIONS: continence and toileting  PARTICIPATION LIMITATIONS: community activity  PERSONAL FACTORS: Fitness, Time since onset of injury/illness/exacerbation, and 3+ comorbidities: Glaucoma; Hypertension; Stroke, Thyroid  are also affecting patient's functional outcome.   REHAB POTENTIAL: Good  CLINICAL DECISION MAKING: Evolving/moderate complexity  EVALUATION COMPLEXITY: Moderate   GOALS: Goals reviewed with patient? Yes  SHORT TERM GOALS: Target date: 05/18/23  Patient is independent with initial HEP.  Baseline: Goal status: INITIAL  2.  Patient is able to contract her pelvic floor for 10 seconds.  Baseline:  Goal status: INITIAL  3.  Patient understands how to perform abdominal massage to assist reduction of straining with bowel movements.  Baseline:  Goal status: INITIAL   LONG TERM GOALS: Target date: 07/13/23  Patient independent with advanced HEP for pelvic floor and core strength.  Baseline:  Goal status: INITIAL  2.   Patient reports her stool leakage decreased >/= 80% due to the puborectalis coming forward.  Baseline:  Goal status: INITIAL  3.  Rectal strength is >/= 3/5 with holding for 40 seconds to improve continence.  Baseline:  Goal status: INITIAL  4.  Patient is able to walk to the bathroom after the urge to have a bowel movement and have no stool leakage.  Baseline:  Goal status: INITIAL   PLAN:  PT FREQUENCY: 1-2x/week  PT DURATION: 12 weeks  PLANNED INTERVENTIONS: 97110-Therapeutic exercises, 97530- Therapeutic activity, 97112- Neuromuscular re-education, 97535- Self Care, 86578- Manual therapy, Patient/Family education, and Biofeedback  PLAN FOR NEXT SESSION: abdominal work, diaphragmatic breathing, core strength, update goals  Marsha Skeen, PT 06/22/23 12:24 PM

## 2023-06-23 ENCOUNTER — Encounter: Payer: Self-pay | Admitting: Obstetrics and Gynecology

## 2023-06-23 ENCOUNTER — Ambulatory Visit: Admitting: Obstetrics and Gynecology

## 2023-06-23 VITALS — BP 124/84 | HR 69

## 2023-06-23 DIAGNOSIS — Z4689 Encounter for fitting and adjustment of other specified devices: Secondary | ICD-10-CM

## 2023-06-23 DIAGNOSIS — N812 Incomplete uterovaginal prolapse: Secondary | ICD-10-CM

## 2023-06-23 DIAGNOSIS — N368 Other specified disorders of urethra: Secondary | ICD-10-CM

## 2023-06-23 MED ORDER — ESTRADIOL 0.1 MG/GM VA CREA: TOPICAL_CREAM | VAGINAL | 0 refills | Status: DC

## 2023-06-23 NOTE — Progress Notes (Signed)
 GYNECOLOGY  VISIT   HPI: 78 y.o.   Married  Caucasian female   G2P0002 with No LMP recorded. Patient is postmenopausal.   here for: 3 month pessary check. No complaints with the pessary.  Uses #3 ring with support pessary.    Does not feel the pessary.  No vaginal drainage or bleeding.   Doing pelvic floor therapy with Davy Estimable.  Urinary and accidental leakage of stool are improved.  Moved to [place].  Unpacking boxes.   GYNECOLOGIC HISTORY: No LMP recorded. Patient is postmenopausal. Contraception:  PMP Menopausal hormone therapy:  Estrace  Last 2 paps:  10/09/22 ASCUS. HR HPV neg, 11/11/21 ASCUS, HR HPV neg History of abnormal Pap or positive HPV:  yes Mammogram:  11/13/22 Breast Density Cat C, BIRADS Cat 1 neg         OB History     Gravida  2   Para      Term      Preterm      AB  0   Living  2      SAB  0   IAB      Ectopic  0   Multiple      Live Births                 Patient Active Problem List   Diagnosis Date Noted   Incontinence of feces with fecal urgency 05/05/2023   Gastroesophageal reflux disease with esophagitis without hemorrhage 05/05/2023   Bloating 05/05/2023   Other specified abnormal immunological findings in serum 04/03/2023   Elevated erythrocyte sedimentation rate 04/03/2023   High coronary artery calcium  score: 1525; 96th percentile 01/29/2023   SVT (supraventricular tachycardia) (HCC) 12/31/2022   Other fatigue 12/31/2022   Decreased exercise tolerance 12/31/2022   Polymyalgia rheumatica (HCC) 11/25/2022   Polyarthralgia 11/25/2022   Other secondary scoliosis, lumbar region 11/17/2022   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   Other female genital prolapse 10/29/2021   Immunization not carried out because of parent refusal 05/06/2021   Long-term use of Plaquenil  04/02/2021   Primary open angle glaucoma (POAG) of both eyes, severe stage 04/02/2021   Primary hypertension 01/11/2021   Hyperlipidemia LDL goal <70  01/11/2021   Acquired thrombophilia (HCC) 12/11/2020   BMI less than 19,adult 12/11/2020   Hypothyroidism 12/11/2020   History of CVA (cerebrovascular accident) 05/02/2020   Lupus    Allergic rhinitis    CAD (coronary artery disease) 05/20/2018    Past Medical History:  Diagnosis Date   Cataract 03/2019   Glaucoma Approximately 2005   Horseshoe retinal tear of left eye 04/02/2021   Hyperlipidemia LDL goal <70 01/11/2021   Hypertension 2022   Long-term use of Plaquenil  04/02/2021   Lupus    new rheum questioning original dx. continuing Plaquenil  at lower dose for now.   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 bladder, acquired    Pseudophakia of both eyes 04/02/2021   Retinal detachment with multiple breaks, right eye 04/02/2021   Stroke (HCC) 04/2020   Thyroid  disease     Past Surgical History:  Procedure Laterality Date   ENDARTERECTOMY Right 05/02/2020   Procedure: RIGHT CAROTID ENDARTERECTOMY;  Surgeon: Mayo Speck, MD;  Location: [facility] OR;  Service: Vascular;  Laterality: Right;   EYE SURGERY  01/2020   LEFT HEART CATH AND CORONARY ANGIOGRAPHY N/A 02/03/2023   Procedure: LEFT HEART CATH AND CORONARY ANGIOGRAPHY;  Surgeon: Odie Benne, MD;  Location:  [facility] INVASIVE CV LAB;  Service: Cardiovascular;  Laterality: N/A;   SCLERAL BUCKLE Right 2004   TUBAL LIGATION  Feb 1982    Current Outpatient Medications  Medication Sig Dispense Refill   ARMOUR THYROID  30 MG tablet Take 1 tablet (30 mg total) by mouth daily. 90 tablet 4   aspirin  EC 81 MG tablet Take 1 tablet (81 mg total) by mouth daily. Swallow whole. 30 tablet 11   cetirizine (ZYRTEC) 10 MG tablet Take 10 mg by mouth See admin instructions. Every 36 hours     estradiol  (ESTRACE ) 0.1 MG/GM vaginal cream Use 1/2 gram three times per week at night. 42.5 g 0   famotidine  (PEPCID ) 20 MG tablet Take 1 tablet (20 mg total) by mouth daily. 30 tablet 3   furosemide  (LASIX ) 40 MG  tablet Take 40 mg by mouth daily as needed for fluid or edema.     hydrochlorothiazide  (HYDRODIURIL ) 25 MG tablet Take 1 tablet (25 mg total) by mouth daily. 90 tablet 3   hydroxychloroquine  (PLAQUENIL ) 200 MG tablet Take 200 mg by mouth every other day.     latanoprost (XALATAN) 0.005 % ophthalmic solution Place 1 drop into the left eye at bedtime.     Misc Natural Products (AIRBORNE ELDERBERRY) CHEW Chew 1 tablet by mouth in the morning and at bedtime.     pantoprazole  (PROTONIX ) 40 MG tablet Take 1 tablet (40 mg total) by mouth daily. 90 tablet 3   predniSONE  (DELTASONE ) 5 MG tablet 3 tablets once a day for 7 days, 2.5 tablets once a day for 7 days, 2 tablets once a day for 7 days, 1.5 tablets once a day for 7 days, 1 tablet once a day for 7 days, 0.5 tablet once a day for 8 days Orally for 43 days     Probiotic Product (UP4 PROBIOTICS WOMENS PO) Take 1 capsule by mouth daily.     Resveratrol 50 MG CAPS Take 50 mg by mouth daily.     rosuvastatin  (CRESTOR ) 40 MG tablet Take 1 tablet (40 mg total) by mouth daily. 90 tablet 3   SIMBRINZA 1-0.2 % SUSP Place 1 drop into the left eye 3 (three) times daily.     Sodium Chloride -Xylitol (XLEAR SINUS CARE SPRAY NA) Place 4 sprays into the nose daily as needed (Moisturing).     Ubiquinol 100 MG CAPS Take 100 mg by mouth.     valsartan  (DIOVAN ) 160 MG tablet TAKE 1 TABLET BY MOUTH EVERY EVENING 30 tablet 2   verapamil  (CALAN -SR) 120 MG CR tablet Take 1 tablet (120 mg total) by mouth at bedtime. 90 tablet 1   No current facility-administered medications for this visit.     ALLERGIES: Amlodipine , Augmentin [amoxicillin-pot clavulanate], Tramadol, and Flexeril [cyclobenzaprine]  Family History  Problem Relation Age of Onset   ALS Mother    Early death Mother    Dementia Father    Hypertension Father    Macular degeneration Maternal Aunt    Alcohol abuse Paternal Uncle    Alcohol abuse Paternal Uncle     Social History   Socioeconomic History    Marital status: Married    Spouse name: Hewitt Lou   Number of children: 2   Years of education: Not on file   Highest education level: 12th grade  Occupational History   Occupation: retired   Occupation: retired  Tobacco Use   Smoking status: Never    Passive exposure: Never   Smokeless tobacco: Never  Vaping Use  Vaping status: Never Used  Substance and Sexual Activity   Alcohol use: Never   Drug use: Never   Sexual activity: Not Currently    Birth control/protection: Surgical  Other Topics Concern   Not on file  Social History Narrative   Marital status/children/pets: Married.  2 children.   Education/employment: Retired.  College-educated.   Safety:      -smoke alarm in the home:Yes     - wears seatbelt: Yes     - Feels safe in their relationships: Yes      Social Drivers of Corporate investment banker Strain: Low Risk  (01/25/2023)   Overall Financial Resource Strain (CARDIA)    Difficulty of Paying Living Expenses: Not hard at all  Food Insecurity: No Food Insecurity (01/25/2023)   Hunger Vital Sign    Worried About Running Out of Food in the Last Year: Never true    Ran Out of Food in the Last Year: Never true  Transportation Needs: No Transportation Needs (01/25/2023)   PRAPARE - Administrator, Civil Service (Medical): No    Lack of Transportation (Non-Medical): No  Physical Activity: Unknown (01/25/2023)   Exercise Vital Sign    Days of Exercise per Week: 0 days    Minutes of Exercise per Session: Patient declined  Stress: No Stress Concern Present (01/25/2023)   [employer] of Occupational Health - Occupational Stress Questionnaire    Feeling of Stress : Only a little  Social Connections: Moderately Isolated (01/25/2023)   Social Connection and Isolation Panel [NHANES]    Frequency of Communication with Friends and Family: Once a week    Frequency of Social Gatherings with Friends and Family: Never    Attends Religious Services: More than 4  times per year    Active Member of [employer] or Organizations: No    Attends Banker Meetings: Not on file    Marital Status: Married  Catering manager Violence: Not At Risk (06/23/2022)   Humiliation, Afraid, Rape, and Kick questionnaire    Fear of Current or Ex-Partner: No    Emotionally Abused: No    Physically Abused: No    Sexually Abused: No    Review of Systems  All other systems reviewed and are negative.   PHYSICAL EXAMINATION:   BP 124/84 (BP Location: Left Arm, Patient Position: Sitting)   Pulse 69   SpO2 99%     General appearance: alert, cooperative and appears stated age   Pelvic: External genitalia:  no lesions              Urethra:  urethral prolapse and signs of bleeding noted.                Bartholins and Skenes: normal                 Vagina: normal appearing vagina with normal color and discharge, no lesions              Cervix: no lesions                Bimanual Exam:  Uterus:  normal size, contour, position, consistency, mobility, non-tender              Adnexa: no mass, fullness, tenderness   Pessary removed, cleansed, and replaced.   Chaperone was present for exam:  Cottie Diss, CMA  ASSESSMENT:  Incomplete uterovaginal prolapse.  Pessary maintenance.  Encounter for medication monitoring.  Hx cervical atypia.   PLAN:  Continue pessary care.  I recommend she use her vaginal estradiol  cream in vagina and to the urethra three times per week.  Next pessary check in 3 months.  Pap due in December.   20 min  total time was spent for this patient encounter, including preparation, face-to-face counseling with the patient, coordination of care, and documentation of the encounter.

## 2023-06-24 ENCOUNTER — Ambulatory Visit: Admitting: Physical Therapy

## 2023-06-24 ENCOUNTER — Ambulatory Visit (INDEPENDENT_AMBULATORY_CARE_PROVIDER_SITE_OTHER): Payer: Medicare Other | Admitting: *Deleted

## 2023-06-24 DIAGNOSIS — Z0000 Encounter for general adult medical examination without abnormal findings: Secondary | ICD-10-CM

## 2023-06-24 NOTE — Progress Notes (Signed)
 Subjective:   Wendy Jackson is a 78 y.o. female who presents for Medicare Annual (Subsequent) preventive examination.  Visit Complete: Virtual I connected with  Wendy Jackson on 06/24/23 by a audio enabled telemedicine application and verified that I am speaking with the correct person using two identifiers.  Patient Location: Home  Provider Location: Home Office  I discussed the limitations of evaluation and management by telemedicine. The patient expressed understanding and agreed to proceed.  Vital Signs: Because this visit was a virtual/telehealth visit, some criteria may be missing or patient reported. Any vitals not documented were not able to be obtained and vitals that have been documented are patient reported.  Patient Medicare AWV questionnaire was completed by the patient on 06-20-2023; I have confirmed that all information answered by patient is correct and no changes since this date.  Cardiac Risk Factors include: advanced age (>37men, >72 women);hypertension     Objective:     There were no vitals filed for this visit. There is no height or weight on file to calculate BMI.     06/24/2023    3:52 PM 04/20/2023    2:50 PM 02/03/2023    7:59 AM 11/28/2022   12:18 PM 06/23/2022    2:32 PM 01/14/2022    3:01 PM 06/12/2021    1:58 PM  Advanced Directives  Does Patient Have a Medical Advance Directive? No Yes No No No No No  Type of Furniture conservator/restorer;Living will       Does patient want to make changes to medical advance directive?  No - Patient declined       Copy of Healthcare Power of Attorney in Chart?  No - copy requested       Would patient like information on creating a medical advance directive? No - Patient declined  No - Patient declined No - Patient declined No - Patient declined No - Patient declined Yes (MAU/Ambulatory/Procedural Areas - Information given)    Current Medications (verified) Outpatient Encounter Medications as of  06/24/2023  Medication Sig   ARMOUR THYROID  30 MG tablet Take 1 tablet (30 mg total) by mouth daily.   aspirin  EC 81 MG tablet Take 1 tablet (81 mg total) by mouth daily. Swallow whole.   cetirizine (ZYRTEC) 10 MG tablet Take 10 mg by mouth See admin instructions. Every 36 hours   estradiol  (ESTRACE ) 0.1 MG/GM vaginal cream Use 1/2 gram three times per week at night.   famotidine  (PEPCID ) 20 MG tablet Take 1 tablet (20 mg total) by mouth daily.   furosemide  (LASIX ) 40 MG tablet Take 40 mg by mouth daily as needed for fluid or edema.   hydrochlorothiazide  (HYDRODIURIL ) 25 MG tablet Take 1 tablet (25 mg total) by mouth daily.   hydroxychloroquine  (PLAQUENIL ) 200 MG tablet Take 200 mg by mouth every other day.   latanoprost (XALATAN) 0.005 % ophthalmic solution Place 1 drop into the left eye at bedtime.   Misc Natural Products (AIRBORNE ELDERBERRY) CHEW Chew 1 tablet by mouth in the morning and at bedtime.   pantoprazole  (PROTONIX ) 40 MG tablet Take 1 tablet (40 mg total) by mouth daily.   predniSONE  (DELTASONE ) 5 MG tablet 3 tablets once a day for 7 days, 2.5 tablets once a day for 7 days, 2 tablets once a day for 7 days, 1.5 tablets once a day for 7 days, 1 tablet once a day for 7 days, 0.5 tablet once a day for 8 days Orally for 43  days   Probiotic Product (UP4 PROBIOTICS WOMENS PO) Take 1 capsule by mouth daily.   Resveratrol 50 MG CAPS Take 50 mg by mouth daily.   rosuvastatin  (CRESTOR ) 40 MG tablet Take 1 tablet (40 mg total) by mouth daily.   SIMBRINZA 1-0.2 % SUSP Place 1 drop into the left eye 3 (three) times daily.   Sodium Chloride -Xylitol (XLEAR SINUS CARE SPRAY NA) Place 4 sprays into the nose daily as needed (Moisturing).   Ubiquinol 100 MG CAPS Take 100 mg by mouth.   valsartan  (DIOVAN ) 160 MG tablet TAKE 1 TABLET BY MOUTH EVERY EVENING   verapamil  (CALAN -SR) 120 MG CR tablet Take 1 tablet (120 mg total) by mouth at bedtime.   No facility-administered encounter medications on file  as of 06/24/2023.    Allergies (verified) Amlodipine , Augmentin [amoxicillin-pot clavulanate], Tramadol, and Flexeril [cyclobenzaprine]   History: Past Medical History:  Diagnosis Date   Cataract 03/2019   Glaucoma Approximately 2005   [place] retinal tear of left eye 04/02/2021   Hyperlipidemia LDL goal <70 01/11/2021   Hypertension 2022   Long-term use of Plaquenil  04/02/2021   Lupus    new rheum questioning original dx. continuing Plaquenil  at lower dose for now.   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 bladder, acquired    Pseudophakia of both eyes 04/02/2021   Retinal detachment with multiple breaks, right eye 04/02/2021   Stroke (HCC) 04/2020   Thyroid  disease    Past Surgical History:  Procedure Laterality Date   ENDARTERECTOMY Right 05/02/2020   Procedure: RIGHT CAROTID ENDARTERECTOMY;  Surgeon: Mayo Speck, MD;  Location: [facility] OR;  Service: Vascular;  Laterality: Right;   EYE SURGERY  01/2020   LEFT HEART CATH AND CORONARY ANGIOGRAPHY N/A 02/03/2023   Procedure: LEFT HEART CATH AND CORONARY ANGIOGRAPHY;  Surgeon: Odie Benne, MD;  Location: [facility] INVASIVE CV LAB;  Service: Cardiovascular;  Laterality: N/A;   SCLERAL BUCKLE Right 2004   TUBAL LIGATION  Feb 1982   Family History  Problem Relation Age of Onset   ALS Mother    Early death Mother    Dementia Father    Hypertension Father    Macular degeneration Maternal Aunt    Alcohol abuse Paternal Uncle    Alcohol abuse Paternal Uncle    Social History   Socioeconomic History   Marital status: Married    Spouse name: Wendy Jackson   Number of children: 2   Years of education: Not on file   Highest education level: 12th grade  Occupational History   Occupation: retired   Occupation: retired  Tobacco Use   Smoking status: Never    Passive exposure: Never   Smokeless tobacco: Never  Vaping Use   Vaping status: Never Used  Substance and Sexual Activity   Alcohol  use: Never   Drug use: Never   Sexual activity: Not Currently    Birth control/protection: Surgical  Other Topics Concern   Not on file  Social History Narrative   Marital status/children/pets: Married.  2 children.   Education/employment: Retired.  College-educated.   Safety:      -smoke alarm in the home:Yes     - wears seatbelt: Yes     - Feels safe in their relationships: Yes      Social Drivers of Health   Financial Resource Strain: Low Risk  (06/24/2023)   Overall Financial Resource Strain (CARDIA)    Difficulty of Paying Living  Expenses: Not hard at all  Food Insecurity: No Food Insecurity (06/24/2023)   Hunger Vital Sign    Worried About Running Out of Food in the Last Year: Never true    Ran Out of Food in the Last Year: Never true  Transportation Needs: No Transportation Needs (06/24/2023)   PRAPARE - Administrator, Civil Service (Medical): No    Lack of Transportation (Non-Medical): No  Physical Activity: Unknown (06/24/2023)   Exercise Vital Sign    Days of Exercise per Week: 0 days    Minutes of Exercise per Session: Patient declined  Stress: No Stress Concern Present (06/24/2023)   [employer] of Occupational Health - Occupational Stress Questionnaire    Feeling of Stress : Only a little  Social Connections: Moderately Isolated (06/24/2023)   Social Connection and Isolation Panel [NHANES]    Frequency of Communication with Friends and Family: Once a week    Frequency of Social Gatherings with Friends and Family: Never    Attends Religious Services: More than 4 times per year    Active Member of [employer] or Organizations: No    Attends Engineer, structural: Never    Marital Status: Married    Tobacco Counseling Counseling given: Not Answered   Clinical Intake:  Pre-visit preparation completed: Yes  Pain : No/denies pain     Diabetes: No  How often do you need to have someone help you when you read instructions, pamphlets, or  other written materials from your doctor or pharmacy?: 1 - Never  Interpreter Needed?: No  Information entered by :: Kieth Pelt LPN   Activities of Daily Living    06/24/2023    3:53 PM 06/20/2023   11:55 AM  In your present state of health, do you have any difficulty performing the following activities:  Hearing? 0 0  Vision? 1 1  Difficulty concentrating or making decisions? 0 0  Walking or climbing stairs? 1 1  Dressing or bathing? 0 0  Doing errands, shopping? 0 0  Preparing Food and eating ? N N  Using the Toilet? N N  In the past six months, have you accidently leaked urine? Y Y  Do you have problems with loss of bowel control? Y Y  Managing your Medications? N N  Managing your Finances? N N  Housekeeping or managing your Housekeeping? Colie Dawes    Patient Care Team: Mariel Shope, DO as PCP - General (Family Medicine) Eilleen Grates, MD as PCP - Cardiology (Cardiology) Johny Nap, NP as Nurse Practitioner (Neurology) Ma Saupe, MD as Referring Physician (Ophthalmology) Dr. Gerre Kraft as Attending Physician (Rheumatology) Jorie Newness, Blondie Burke, MD as Consulting Physician (Obstetrics and Gynecology)  Indicate any recent Medical Services you may have received from other than [facility] providers in the past year (date may be approximate).     Assessment:    This is a routine wellness examination for [place].  Hearing/Vision screen Hearing Screening - Comments:: No trouble hearing Vision Screening - Comments:: Up to date [place]   [facility]   Goals Addressed               This Visit's Progress     Patient Stated   On track     Increase activity      Patient Stated   On track     Stay healthy      Patient Stated (pt-stated)   On track     Stay independent  Patient Stated        Have better health with energy       Depression Screen    06/24/2023    3:59 PM 05/12/2023    1:12 PM 09/02/2022    8:42 AM 06/23/2022    2:30 PM 05/06/2022    12:51 PM 06/12/2021    1:56 PM 06/03/2021    1:41 PM  PHQ 2/9 Scores  PHQ - 2 Score 2 0 0 0 0 0 0  PHQ- 9 Score 7          Fall Risk    06/24/2023    3:51 PM 06/20/2023   11:55 AM 05/12/2023    1:11 PM 09/02/2022    8:42 AM 06/23/2022    2:33 PM  Fall Risk   Falls in the past year? 0 0 0 0 0  Number falls in past yr: 0 0 0 0 0  Injury with Fall? 0 1 0 0 0  Risk for fall due to :   No Fall Risks    Follow up Falls evaluation completed;Education provided;Falls prevention discussed  Falls evaluation completed Falls evaluation completed Falls evaluation completed    MEDICARE RISK AT HOME: Medicare Risk at Home Any stairs in or around the home?: Yes If so, are there any without handrails?: No Home free of loose throw rugs in walkways, pet beds, electrical cords, etc?: Yes Adequate lighting in your home to reduce risk of falls?: Yes Life alert?: No Use of a cane, walker or w/c?: No Grab bars in the bathroom?: Yes Shower chair or bench in shower?: Yes Elevated toilet seat or a handicapped toilet?: No  TIMED UP AND GO:  Was the test performed?  No    Cognitive Function:    04/06/2023    2:40 PM 04/06/2023    2:39 PM 04/06/2023    2:35 PM  MMSE - Mini Mental State Exam  Orientation to time   5  Orientation to Place   5  Registration   3  Attention/ Calculation   5  Recall   3  Language- name 2 objects 2  2  Language- repeat  1 1  Language- follow 3 step command   3  Language- read & follow direction  1 1  Write a sentence   1  Copy design   1  Total score   30        06/24/2023    3:58 PM 06/23/2022    2:34 PM 06/12/2021    2:02 PM  6CIT Screen  What Year? 0 points 0 points 0 points  What month? 0 points 0 points 0 points  What time? 0 points 0 points 0 points  Count back from 20 0 points 0 points 0 points  Months in reverse 0 points  0 points  Repeat phrase 4 points 0 points 0 points  Total Score 4 points  0 points    Immunizations Immunization History   Administered Date(s) Administered   PFIZER(Purple Top)SARS-COV-2 Vaccination 02/20/2019, 03/16/2019, 11/11/2019, 06/15/2020   Pfizer Covid-19 Vaccine Bivalent Booster 63yrs & up 10/05/2020   Tdap 08/04/2019    TDAP status: Up to date  Flu Vaccine status: Up to date  Pneumococcal vaccine status: Declined,  Education has been provided regarding the importance of this vaccine but patient still declined. Advised may receive this vaccine at local pharmacy or Health Dept. Aware to provide a copy of the vaccination record if obtained from local pharmacy or Health Dept. Verbalized  acceptance and understanding.   Covid-19 vaccine status: Declined, Education has been provided regarding the importance of this vaccine but patient still declined. Advised may receive this vaccine at local pharmacy or Health Dept.or vaccine clinic. Aware to provide a copy of the vaccination record if obtained from local pharmacy or Health Dept. Verbalized acceptance and understanding.  Qualifies for Shingles Vaccine? Yes   Zostavax completed No   Shingrix  Completed?: No.    Education has been provided regarding the importance of this vaccine. Patient has been advised to call insurance company to determine out of pocket expense if they have not yet received this vaccine. Advised may also receive vaccine at local pharmacy or Health Dept. Verbalized acceptance and understanding.  Screening Tests Health Maintenance  Topic Date Due   DEXA SCAN  Never done   INFLUENZA VACCINE  08/14/2023   Medicare Annual Wellness (AWV)  06/23/2024   DTaP/Tdap/Td (2 - Td or Tdap) 08/03/2029   Hepatitis C Screening  Completed   HPV VACCINES  Aged Out   Meningococcal B Vaccine  Aged Out   Pneumonia Vaccine 5+ Years old  Discontinued   Colonoscopy  Discontinued   COVID-19 Vaccine  Discontinued   Zoster Vaccines- Shingrix   Discontinued    Health Maintenance  Health Maintenance Due  Topic Date Due   DEXA SCAN  Never done     Colorectal cancer screening: No longer required.   Mammogram status: Completed  . Repeat every year  Bone Density  Education provided   declined at this time  Lung Cancer Screening: (Low Dose CT Chest recommended if Age 63-80 years, 20 pack-year currently smoking OR have quit w/in 15years.) does not qualify.   Lung Cancer Screening Referral:   Additional Screening:  Hepatitis C Screening: does not qualify; Completed 2024  Vision Screening: Recommended annual ophthalmology exams for early detection of glaucoma and other disorders of the eye. Is the patient up to date with their annual eye exam?  Yes  Who is the provider or what is the name of the office in which the patient attends annual eye exams? [facility] If pt is not established with a provider, would they like to be referred to a provider to establish care? No .   Dental Screening: Recommended annual dental exams for proper oral hygiene    Community Resource Referral / Chronic Care Management: CRR required this visit?  No   CCM required this visit?  No     Plan:     I have personally reviewed and noted the following in the patient's chart:   Medical and social history Use of alcohol, tobacco or illicit drugs  Current medications and supplements including opioid prescriptions. Patient is not currently taking opioid prescriptions. Functional ability and status Nutritional status Physical activity Advanced directives List of other physicians Hospitalizations, surgeries, and ER visits in previous 12 months Vitals Screenings to include cognitive, depression, and falls Referrals and appointments  In addition, I have reviewed and discussed with patient certain preventive protocols, quality metrics, and best practice recommendations. A written personalized care plan for preventive services as well as general preventive health recommendations were provided to patient.     Kieth Pelt, LPN   1/61/0960   After  Visit Summary: (MyChart) Due to this being a telephonic visit, the after visit summary with patients personalized plan was offered to patient via MyChart   Nurse Notes:

## 2023-06-24 NOTE — Patient Instructions (Signed)
 Wendy Jackson , Thank you for taking time to come for your Medicare Wellness Visit. I appreciate your ongoing commitment to your health goals. Please review the following plan we discussed and let me know if I can assist you in the future.   Screening recommendations/referrals: Colonoscopy: no longer required Mammogram: up to date Bone Density: Education provided Recommended yearly ophthalmology/optometry visit for glaucoma screening and checkup Recommended yearly dental visit for hygiene and checkup  Vaccinations: Influenza vaccine: up to date Pneumococcal vaccine:  Tdap vaccine: up to date Shingles vaccine:       Preventive Care 65 Years and Older, Female Preventive care refers to lifestyle choices and visits with your health care provider that can promote health and wellness. What does preventive care include? A yearly physical exam. This is also called an annual well check. Dental exams once or twice a year. Routine eye exams. Ask your health care provider how often you should have your eyes checked. Personal lifestyle choices, including: Daily care of your teeth and gums. Regular physical activity. Eating a healthy diet. Avoiding tobacco and drug use. Limiting alcohol use. Practicing safe sex. Taking low-dose aspirin  every day. Taking vitamin and mineral supplements as recommended by your health care provider. What happens during an annual well check? The services and screenings done by your health care provider during your annual well check will depend on your age, overall health, lifestyle risk factors, and family history of disease. Counseling  Your health care provider may ask you questions about your: Alcohol use. Tobacco use. Drug use. Emotional well-being. Home and relationship well-being. Sexual activity. Eating habits. History of falls. Memory and ability to understand (cognition). Work and work Astronomer. Reproductive health. Screening  You may have the  following tests or measurements: Height, weight, and BMI. Blood pressure. Lipid and cholesterol levels. These may be checked every 5 years, or more frequently if you are over 69 years old. Skin check. Lung cancer screening. You may have this screening every year starting at age 42 if you have a 30-pack-year history of smoking and currently smoke or have quit within the past 15 years. Fecal occult blood test (FOBT) of the stool. You may have this test every year starting at age 80. Flexible sigmoidoscopy or colonoscopy. You may have a sigmoidoscopy every 5 years or a colonoscopy every 10 years starting at age 34. Hepatitis C blood test. Hepatitis B blood test. Sexually transmitted disease (STD) testing. Diabetes screening. This is done by checking your blood sugar (glucose) after you have not eaten for a while (fasting). You may have this done every 1-3 years. Bone density scan. This is done to screen for osteoporosis. You may have this done starting at age 2. Mammogram. This may be done every 1-2 years. Talk to your health care provider about how often you should have regular mammograms. Talk with your health care provider about your test results, treatment options, and if necessary, the need for more tests. Vaccines  Your health care provider may recommend certain vaccines, such as: Influenza vaccine. This is recommended every year. Tetanus, diphtheria, and acellular pertussis (Tdap, Td) vaccine. You may need a Td booster every 10 years. Zoster vaccine. You may need this after age 77. Pneumococcal 13-valent conjugate (PCV13) vaccine. One dose is recommended after age 61. Pneumococcal polysaccharide (PPSV23) vaccine. One dose is recommended after age 55. Talk to your health care provider about which screenings and vaccines you need and how often you need them. This information is not intended to  replace advice given to you by your health care provider. Make sure you discuss any questions you  have with your health care provider. Document Released: 01/26/2015 Document Revised: 09/19/2015 Document Reviewed: 10/31/2014 Elsevier Interactive Patient Education  2017 [employer].  Fall Prevention in the Home Falls can cause injuries. They can happen to people of all ages. There are many things you can do to make your home safe and to help prevent falls. What can I do on the outside of my home? Regularly fix the edges of walkways and driveways and fix any cracks. Remove anything that might make you trip as you walk through a door, such as a raised step or threshold. Trim any bushes or trees on the path to your home. Use bright outdoor lighting. Clear any walking paths of anything that might make someone trip, such as rocks or tools. Regularly check to see if handrails are loose or broken. Make sure that both sides of any steps have handrails. Any raised decks and porches should have guardrails on the edges. Have any leaves, snow, or ice cleared regularly. Use sand or salt on walking paths during winter. Clean up any spills in your garage right away. This includes oil or grease spills. What can I do in the bathroom? Use night lights. Install grab bars by the toilet and in the tub and shower. Do not use towel bars as grab bars. Use non-skid mats or decals in the tub or shower. If you need to sit down in the shower, use a plastic, non-slip stool. Keep the floor dry. Clean up any water that spills on the floor as soon as it happens. Remove soap buildup in the tub or shower regularly. Attach bath mats securely with double-sided non-slip rug tape. Do not have throw rugs and other things on the floor that can make you trip. What can I do in the bedroom? Use night lights. Make sure that you have a light by your bed that is easy to reach. Do not use any sheets or blankets that are too big for your bed. They should not hang down onto the floor. Have a firm chair that has side arms. You can  use this for support while you get dressed. Do not have throw rugs and other things on the floor that can make you trip. What can I do in the kitchen? Clean up any spills right away. Avoid walking on wet floors. Keep items that you use a lot in easy-to-reach places. If you need to reach something above you, use a strong step stool that has a grab bar. Keep electrical cords out of the way. Do not use floor polish or wax that makes floors slippery. If you must use wax, use non-skid floor wax. Do not have throw rugs and other things on the floor that can make you trip. What can I do with my stairs? Do not leave any items on the stairs. Make sure that there are handrails on both sides of the stairs and use them. Fix handrails that are broken or loose. Make sure that handrails are as long as the stairways. Check any carpeting to make sure that it is firmly attached to the stairs. Fix any carpet that is loose or worn. Avoid having throw rugs at the top or bottom of the stairs. If you do have throw rugs, attach them to the floor with carpet tape. Make sure that you have a light switch at the top of the stairs and the  bottom of the stairs. If you do not have them, ask someone to add them for you. What else can I do to help prevent falls? Wear shoes that: Do not have high heels. Have rubber bottoms. Are comfortable and fit you well. Are closed at the toe. Do not wear sandals. If you use a stepladder: Make sure that it is fully opened. Do not climb a closed stepladder. Make sure that both sides of the stepladder are locked into place. Ask someone to hold it for you, if possible. Clearly mark and make sure that you can see: Any grab bars or handrails. First and last steps. Where the edge of each step is. Use tools that help you move around (mobility aids) if they are needed. These include: Canes. Walkers. Scooters. Crutches. Turn on the lights when you go into a dark area. Replace any light  bulbs as soon as they burn out. Set up your furniture so you have a clear path. Avoid moving your furniture around. If any of your floors are uneven, fix them. If there are any pets around you, be aware of where they are. Review your medicines with your doctor. Some medicines can make you feel dizzy. This can increase your chance of falling. Ask your doctor what other things that you can do to help prevent falls. This information is not intended to replace advice given to you by your health care provider. Make sure you discuss any questions you have with your health care provider. Document Released: 10/26/2008 Document Revised: 06/07/2015 Document Reviewed: 02/03/2014 Elsevier Interactive Patient Education  2017 [employer].

## 2023-06-26 ENCOUNTER — Encounter: Payer: Self-pay | Admitting: Physical Therapy

## 2023-06-26 ENCOUNTER — Ambulatory Visit: Admitting: Physical Therapy

## 2023-06-26 DIAGNOSIS — R278 Other lack of coordination: Secondary | ICD-10-CM | POA: Diagnosis not present

## 2023-06-26 DIAGNOSIS — M6281 Muscle weakness (generalized): Secondary | ICD-10-CM

## 2023-06-26 NOTE — Therapy (Signed)
 OUTPATIENT PHYSICAL THERAPY FEMALE PELVIC TREATMENT   Patient Name: Wendy Jackson MRN: [id number] DOB:07/05/1945, 78 y.o., female Today's Date: 06/26/2023  END OF SESSION:  PT End of Session - 06/26/23 0936     Visit Number 4    Date for PT Re-Evaluation 07/13/23    Authorization Type [employer] medicare    Authorization - Visit Number 4    Authorization - Number of Visits 10    PT Start Time 0930    PT Stop Time 1010    PT Time Calculation (min) 40 min    Activity Tolerance Patient tolerated treatment well    Behavior During Therapy [facility] for tasks assessed/performed          Past Medical History:  Diagnosis Date   Cataract 03/2019   Glaucoma Approximately 2005   [place] retinal tear of left eye 04/02/2021   Hyperlipidemia LDL goal <70 01/11/2021   Hypertension 2022   Long-term use of Plaquenil  04/02/2021   Lupus    new rheum questioning original dx. continuing Plaquenil  at lower dose for now.   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 bladder, acquired    Pseudophakia of both eyes 04/02/2021   Retinal detachment with multiple breaks, right eye 04/02/2021   Stroke (HCC) 04/2020   Thyroid  disease    Past Surgical History:  Procedure Laterality Date   ENDARTERECTOMY Right 05/02/2020   Procedure: RIGHT CAROTID ENDARTERECTOMY;  Surgeon: Mayo Speck, MD;  Location: [facility];  Service: Vascular;  Laterality: Right;   EYE SURGERY  01/2020   LEFT HEART CATH AND CORONARY ANGIOGRAPHY N/A 02/03/2023   Procedure: LEFT HEART CATH AND CORONARY ANGIOGRAPHY;  Surgeon: Odie Benne, MD;  Location: [facility] INVASIVE CV LAB;  Service: Cardiovascular;  Laterality: N/A;   SCLERAL BUCKLE Right 2004   TUBAL LIGATION  Feb 1982   Patient Active Problem List   Diagnosis Date Noted   Incontinence of feces with fecal urgency 05/05/2023   Gastroesophageal reflux disease with esophagitis without hemorrhage 05/05/2023   Bloating 05/05/2023   Other  specified abnormal immunological findings in serum 04/03/2023   Elevated erythrocyte sedimentation rate 04/03/2023   High coronary artery calcium  score: 1525; 96th percentile 01/29/2023   SVT (supraventricular tachycardia) (HCC) 12/31/2022   Other fatigue 12/31/2022   Decreased exercise tolerance 12/31/2022   Polymyalgia rheumatica (HCC) 11/25/2022   Polyarthralgia 11/25/2022   Other secondary scoliosis, lumbar region 11/17/2022   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   Other female genital prolapse 10/29/2021   Immunization not carried out because of parent refusal 05/06/2021   Long-term use of Plaquenil  04/02/2021   Primary open angle glaucoma (POAG) of both eyes, severe stage 04/02/2021   Primary hypertension 01/11/2021   Hyperlipidemia LDL goal <70 01/11/2021   Acquired thrombophilia (HCC) 12/11/2020   BMI less than 19,adult 12/11/2020   Hypothyroidism 12/11/2020   History of CVA (cerebrovascular accident) 05/02/2020   Lupus    Allergic rhinitis    CAD (coronary artery disease) 05/20/2018    PCP: Mariel Shope, DO  REFERRING PROVIDER: Sergio Dandy, MD   REFERRING DIAG:  K58.1 (ICD-10-CM) - Irritable bowel syndrome with constipation  R15.9,R15.2 (ICD-10-CM) - Incontinence of feces with fecal urgency    THERAPY DIAG:  Muscle weakness (generalized)  Other lack of coordination  Rationale for Evaluation and Treatment: Rehabilitation  ONSET DATE: 1/25  SUBJECTIVE:  SUBJECTIVE STATEMENT:I have been doing my exercises and it is helping. I have started to use the estradiol  cream.    PAIN:  Are you having pain? No  PRECAUTIONS: None  RED FLAGS: None   WEIGHT BEARING RESTRICTIONS: No  FALLS:  Has patient fallen in last 6 months? No  OCCUPATION: retired  ACTIVITY LEVEL : tries to  exercise, walks  PLOF: Independent  PATIENT GOALS: reduce the fecal leakage  PERTINENT HISTORY:  Glaucoma; Hypertension; Stroke, Thyroid ;  Sexual abuse: No  BOWEL MOVEMENT: Pain with bowel movement: No Type of bowel movement:Type (Bristol Stool Scale) type 4, Frequency 1-2 days, Strain some straining, and Splinting none Fully empty rectum: Yes:   Leakage: Yes: right after a bowel movement Pads: Yes: 1 per day and uses tissue paper so she is not changing the pad as often Fiber supplement/laxative prebiotic fiber  URINATION: Pain with urination: No Fully empty bladder: No sometimes she does not; pessary of the prolapse Stream: Strong Urgency: sometimes mostly at night Frequency: every 2-3 hours; night 2-3 times Leakage: Urge to void and Walking to the bathroom Pads: Yes: 1 pad per night  INTERCOURSE: not active   PREGNANCY: Vaginal deliveries 2 Tearing Yes:   Episiotomy No  PROLAPSE: Patient is wearing a pessary    OBJECTIVE:  Note: Objective measures were completed at Evaluation unless otherwise noted.  DIAGNOSTIC FINDINGS:  None  PATIENT SURVEYS:  CRAIQ-7: 10 PFIQ-7: 6  COGNITION: Overall cognitive status: Within functional limits for tasks assessed     SENSATION: Light touch: Appears intact    GAIT: Assistive device utilized: None  POSTURE: rounded shoulders, forward head, increased thoracic kyphosis, and posterior pelvic tilt   LUMBARAROM/PROM:  A/PROM A/PROM  eval  Flexion full  Extension Decreased by 25%  Right lateral flexion Decreased by 25%  Left lateral flexion Decreased by 25%  Right rotation Decreased by 25%  Left rotation Decreased by 25%   (Blank rows = not tested)  LOWER EXTREMITY ROM: bilateral hip ROM is full   LOWER EXTREMITY MMT: bilateral hip strength is 4/5  PALPATION:    Abdominal: decreased lower abdominal contractions                External Perineal Exam: the anus is open                             Internal  Pelvic Floor: the puborectalis has difficulty to move forward  Patient confirms identification and approves PT to assess internal pelvic floor and treatment Yes  PELVIC MMT:   MMT eval 06/22/23  Internal Anal Sphincter 2/5, 5 s 3/5 weak hug of therapist finger, 6 sec 3 x  External Anal Sphincter 2/5, 5 s 3/5 weak hug of therapist finger, 6 sec 3 x  Puborectalis 2/5, 5 s 3/5 weak hug of therapist finger, 6 sec 3 x  (Blank rows = not tested)        TONE: Low tone   TODAY'S TREATMENT:   06/26/23 Manual: Soft tissue mobilization: Circular massage to abdomen to improve peristalic motion of the intestines Manual work to the diaphragm to improve the rib angle and expansion of the lower rib cage for diaphragmatic breathing Scar tissue mobilization: Manual work to lower abdominal scar going through the restrictions and pulling upward  Myofascial release: Tissue rolling of the abdominal tissue to reduce restrictions Fascial release around the lower abdomen to release around the colon Exercises: Strengthening: Nustep level 5  for 6 minutes while assessing patient Supine ball squeeze holding 5 sec 10 x for pelvic floor contraction Bridge with ball squeeze 10 x  Supine hip flexion isometric to engage the lower abdominals Self-care: Educated patient on how to place the estradiol  on the vaginal canal, vulvar and urethra to assist with the health of the tissue    06/22/23 Manual: Myofascial release: Fascial release around the perineal body going through the restrictions Internal pelvic floor techniques: No emotional/communication barriers or cognitive limitation. Patient is motivated to learn. Patient understands and agrees with treatment goals and plan. PT explains patient will be examined in standing, sitting, and lying down to see how their muscles and joints work. When they are ready, they will be asked to remove their underwear so PT can examine their perineum. The patient is also given  the option of providing their own chaperone as one is not provided in our facility. The patient also has the right and is explained the right to defer or refuse any part of the evaluation or treatment including the internal exam. With the patient's consent, PT will use one gloved finger to gently assess the muscles of the pelvic floor, seeing how well it contracts and relaxes and if there is muscle symmetry. After, the patient will get dressed and PT and patient will discuss exam findings and plan of care. PT and patient discuss plan of care, schedule, attendance policy and HEP activities.  Therapist gloved finger in the rectum working on the puborectalis, distraction of the coccyx, manual work to the anococcygeal ligament Neuromuscular re-education: Pelvic floor contraction training: Therapist gloved finger in the rectum taping the puborectalis to assist with it coming forward, then contract with therapist trying to take her finger out of the canal, 10 quick contractions, 10 contractions holding for 6 seconds Sitting ball squeeze holding 1 sec 10 x with pelvic floor contraction Sit to stand with pelvic floor contraction 10 x    04/27/23 Manual: Soft tissue mobilization: Circular massage to the abdomen to promote peristalic motion of the intestines and educated patient on how to perform at home.  Scar tissue mobilization: Myofascial release: Fascial release for the lower abdomen to go through the restrictions Neuromuscular re-education: Pelvic floor contraction training: Supine ball squeeze holding 10 sec 10 x with therapist hand placed on the rectal area to feel the contraction Sitting ball squeeze holding 10 sec 10 x with pelvic floor contraction Sit to stand with pelvic floor contraction  Exercises: Strengthening: Nustep level 3 for 5 minutes while assessing patient and to build endurance      PATIENT EDUCATION:  06/26/23 Education details: Access Code: [email or web address] Person educated:  Patient Education method: Explanation, Demonstration, Tactile cues, Verbal cues, and Handouts Education comprehension: verbalized understanding, returned demonstration, verbal cues required, tactile cues required, and needs further education  HOME EXERCISE PROGRAM: 06/26/23 Access Code: [email or web address] URL: [email or web address] Date: 06/26/2023 Prepared by: Marsha Skeen  Exercises - Sidelying Pelvic Floor Contraction with Self-Palpation  - 3 x daily - 7 x weekly - 1 sets - 10 reps - 5 sec hold - Supine Hip Adduction Isometric with Ball  - 1 x daily - 7 x weekly - 1 sets - 10 reps - 10 sec hold - Seated Hip Adduction Isometrics with Ball  - 1 x daily - 7 x weekly - 1 sets - 10 reps - 1 sec hold - Sit to Stand with Pelvic Floor Contraction  - 1 x daily - 7 x weekly - 1 sets -  10 reps - Supine Bridge with Mini Swiss Ball Between Knees  - 1 x daily - 7 x weekly - 1 sets - 10 reps - Hooklying Isometric Hip Flexion  - 1 x daily - 7 x weekly - 1 sets - 10 reps  Patient Education - Abdominal Massage for Constipation - Abdominal Massage for Constipation  ASSESSMENT:  CLINICAL IMPRESSION: Patient is a 78 y.o. female who was seen today for physical therapy  treatment for IBS and fecal leakage.   Patient reports her fecal leakage is 90% better. She has had fecal leakage 1-2 since last visit. Patient is learning how to engage her lower abdominals. Patient understands how to perform circular massage to the abdomen. She continues to have tightness in the upper abdomen restricting the opening of the lower rib cage. Patient would benefit from skilled therapy to improve pelvic floor coordination and reduce fecal leakage.   OBJECTIVE IMPAIRMENTS: decreased endurance, decreased strength, and increased fascial restrictions.   ACTIVITY LIMITATIONS: continence and toileting  PARTICIPATION LIMITATIONS: community activity  PERSONAL FACTORS: Fitness, Time since onset of injury/illness/exacerbation, and  3+ comorbidities: Glaucoma; Hypertension; Stroke, Thyroid  are also affecting patient's functional outcome.   REHAB POTENTIAL: Good  CLINICAL DECISION MAKING: Evolving/moderate complexity  EVALUATION COMPLEXITY: Moderate   GOALS: Goals reviewed with patient? Yes  SHORT TERM GOALS: Target date: 05/18/23  Patient is independent with initial HEP.  Baseline: Goal status: Met 06/26/23  2.  Patient is able to contract her pelvic floor for 10 seconds.  Baseline:  Goal status: INITIAL  3.  Patient understands how to perform abdominal massage to assist reduction of straining with bowel movements.  Baseline:  Goal status: Met 06/26/23   LONG TERM GOALS: Target date: 07/13/23  Patient independent with advanced HEP for pelvic floor and core strength.  Baseline:  Goal status: INITIAL  2.  Patient reports her stool leakage decreased >/= 80% due to the puborectalis coming forward.  Baseline:  Goal status: INITIAL  3.  Rectal strength is >/= 3/5 with holding for 40 seconds to improve continence.  Baseline:  Goal status: INITIAL  4.  Patient is able to walk to the bathroom after the urge to have a bowel movement and have no stool leakage.  Baseline:  Goal status: INITIAL   PLAN:  PT FREQUENCY: 1-2x/week  PT DURATION: 12 weeks  PLANNED INTERVENTIONS: 97110-Therapeutic exercises, 97530- Therapeutic activity, 97112- Neuromuscular re-education, 97535- Self Care, [id number]- Manual therapy, Patient/Family education, and Biofeedback  PLAN FOR NEXT SESSION: abdominal work, diaphragmatic breathing, core strength  Marsha Skeen, PT 06/26/23 10:14 AM

## 2023-06-29 ENCOUNTER — Ambulatory Visit: Admitting: Physical Therapy

## 2023-06-29 ENCOUNTER — Encounter: Payer: Self-pay | Admitting: Physical Therapy

## 2023-06-29 DIAGNOSIS — R278 Other lack of coordination: Secondary | ICD-10-CM

## 2023-06-29 DIAGNOSIS — M6281 Muscle weakness (generalized): Secondary | ICD-10-CM | POA: Diagnosis not present

## 2023-06-29 NOTE — Therapy (Signed)
 OUTPATIENT PHYSICAL THERAPY FEMALE PELVIC TREATMENT   Patient Name: Wendy Jackson MRN: [id number] DOB:Sep 30, 1945, 78 y.o., female Today's Date: 06/29/2023  END OF SESSION:  PT End of Session - 06/29/23 1104     Visit Number 5    Date for PT Re-Evaluation 07/13/23    Authorization Type BC[employer] medicare    Authorization - Visit Number 5    Authorization - Number of Visits 10    PT Start Time 1100    PT Stop Time 1140    PT Time Calculation (min) 40 min    Activity Tolerance Patient tolerated treatment well    Behavior During Therapy [facility] for tasks assessed/performed          Past Medical History:  Diagnosis Date   Cataract 03/2019   Glaucoma Approximately 2005   Horseshoe retinal tear of left eye 04/02/2021   Hyperlipidemia LDL goal <70 01/11/2021   Hypertension 2022   Long-term use of Plaquenil  04/02/2021   Lupus    new rheum questioning original dx. continuing Plaquenil  at lower dose for now.   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 bladder, acquired    Pseudophakia of both eyes 04/02/2021   Retinal detachment with multiple breaks, right eye 04/02/2021   Stroke (HCC) 04/2020   Thyroid  disease    Past Surgical History:  Procedure Laterality Date   ENDARTERECTOMY Right 05/02/2020   Procedure: RIGHT CAROTID ENDARTERECTOMY;  Surgeon: Mayo Speck, MD;  Location: [facility] OR;  Service: Vascular;  Laterality: Right;   EYE SURGERY  01/2020   LEFT HEART CATH AND CORONARY ANGIOGRAPHY N/A 02/03/2023   Procedure: LEFT HEART CATH AND CORONARY ANGIOGRAPHY;  Surgeon: Odie Benne, MD;  Location: [facility] INVASIVE CV LAB;  Service: Cardiovascular;  Laterality: N/A;   SCLERAL BUCKLE Right 2004   TUBAL LIGATION  Feb 1982   Patient Active Problem List   Diagnosis Date Noted   Incontinence of feces with fecal urgency 05/05/2023   Gastroesophageal reflux disease with esophagitis without hemorrhage 05/05/2023   Bloating 05/05/2023   Other  specified abnormal immunological findings in serum 04/03/2023   Elevated erythrocyte sedimentation rate 04/03/2023   High coronary artery calcium  score: 1525; 96th percentile 01/29/2023   SVT (supraventricular tachycardia) (HCC) 12/31/2022   Other fatigue 12/31/2022   Decreased exercise tolerance 12/31/2022   Polymyalgia rheumatica (HCC) 11/25/2022   Polyarthralgia 11/25/2022   Other secondary scoliosis, lumbar region 11/17/2022   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   Other female genital prolapse 10/29/2021   Immunization not carried out because of parent refusal 05/06/2021   Long-term use of Plaquenil  04/02/2021   Primary open angle glaucoma (POAG) of both eyes, severe stage 04/02/2021   Primary hypertension 01/11/2021   Hyperlipidemia LDL goal <70 01/11/2021   Acquired thrombophilia (HCC) 12/11/2020   BMI less than 19,adult 12/11/2020   Hypothyroidism 12/11/2020   History of CVA (cerebrovascular accident) 05/02/2020   Lupus    Allergic rhinitis    CAD (coronary artery disease) 05/20/2018    PCP: Mariel Shope, DO  REFERRING PROVIDER: Sergio Dandy, MD   REFERRING DIAG:  K58.1 (ICD-10-CM) - Irritable bowel syndrome with constipation  R15.9,R15.2 (ICD-10-CM) - Incontinence of feces with fecal urgency    THERAPY DIAG:  Muscle weakness (generalized)  Other lack of coordination  Rationale for Evaluation and Treatment: Rehabilitation  ONSET DATE: 1/25  SUBJECTIVE:  SUBJECTIVE STATEMENT:I have been doing my exercises and it is helping. I have started to use the estradiol  cream.    PAIN:  Are you having pain? No  PRECAUTIONS: None  RED FLAGS: None   WEIGHT BEARING RESTRICTIONS: No  FALLS:  Has patient fallen in last 6 months? No  OCCUPATION: retired  ACTIVITY LEVEL : tries to  exercise, walks  PLOF: Independent  PATIENT GOALS: reduce the fecal leakage  PERTINENT HISTORY:  Glaucoma; Hypertension; Stroke, Thyroid ;  Sexual abuse: No  BOWEL MOVEMENT: Pain with bowel movement: No Type of bowel movement:Type (Bristol Stool Scale) type 4, Frequency 1-2 days, Strain some straining, and Splinting none Fully empty rectum: Yes:   Leakage: Yes: right after a bowel movement Pads: Yes: 1 per day and uses tissue paper so she is not changing the pad as often Fiber supplement/laxative prebiotic fiber  URINATION: Pain with urination: No Fully empty bladder: No sometimes she does not; pessary of the prolapse Stream: Strong Urgency: sometimes mostly at night Frequency: every 2-3 hours; night 2-3 times Leakage: Urge to void and Walking to the bathroom 06/29/23; no urinary leakage at this time Pads: Yes: 1 pad per night  INTERCOURSE: not active   PREGNANCY: Vaginal deliveries 2 Tearing Yes:   Episiotomy No  PROLAPSE: Patient is wearing a pessary    OBJECTIVE:  Note: Objective measures were completed at Evaluation unless otherwise noted.  DIAGNOSTIC FINDINGS:  None  PATIENT SURVEYS:  CRAIQ-7: 10 PFIQ-7: 6  COGNITION: Overall cognitive status: Within functional limits for tasks assessed     SENSATION: Light touch: Appears intact    GAIT: Assistive device utilized: None  POSTURE: rounded shoulders, forward head, increased thoracic kyphosis, and posterior pelvic tilt   LUMBARAROM/PROM:  A/PROM A/PROM  eval  Flexion full  Extension Decreased by 25%  Right lateral flexion Decreased by 25%  Left lateral flexion Decreased by 25%  Right rotation Decreased by 25%  Left rotation Decreased by 25%   (Blank rows = not tested)  LOWER EXTREMITY ROM: bilateral hip ROM is full   LOWER EXTREMITY MMT: bilateral hip strength is 4/5  PALPATION:    Abdominal: decreased lower abdominal contractions                External Perineal Exam: the anus is  open                             Internal Pelvic Floor: the puborectalis has difficulty to move forward  Patient confirms identification and approves PT to assess internal pelvic floor and treatment Yes  PELVIC MMT:   MMT eval 06/22/23  Internal Anal Sphincter 2/5, 5 s 3/5 weak hug of therapist finger, 6 sec 3 x  External Anal Sphincter 2/5, 5 s 3/5 weak hug of therapist finger, 6 sec 3 x  Puborectalis 2/5, 5 s 3/5 weak hug of therapist finger, 6 sec 3 x  (Blank rows = not tested)        TONE: Low tone   TODAY'S TREATMENT:   06/29/23 Exercises: Strengthening: Nustep level 3 for 7 minutes while assessing patient Supine ball squeeze holding 5 sec 10 x for pelvic floor contraction Bridge with ball squeeze 15 x  Supine hip flexion isometric to engage the lower abdominals 20 x  Quadruped lift arm 15 x each then lift leg 15 X each Standing bilateral shoulder extension with red band 15 x  Standing trunk rotation resistance 10 x each way  with red band to engage the core    06/26/23 Manual: Soft tissue mobilization: Circular massage to abdomen to improve peristalic motion of the intestines Manual work to the diaphragm to improve the rib angle and expansion of the lower rib cage for diaphragmatic breathing Scar tissue mobilization: Manual work to lower abdominal scar going through the restrictions and pulling upward Myofascial release: Tissue rolling of the abdominal tissue to reduce restrictions Fascial release around the lower abdomen to release around the colon Exercises: Strengthening: Nustep level 5 for 6 minutes while assessing patient Supine ball squeeze holding 5 sec 10 x for pelvic floor contraction Bridge with ball squeeze 10 x  Supine hip flexion isometric to engage the lower abdominals Self-care: Educated patient on how to place the estradiol  on the vaginal canal, vulvar and urethra to assist with the health of the tissue    06/22/23 Manual: Myofascial  release: Fascial release around the perineal body going through the restrictions Internal pelvic floor techniques: No emotional/communication barriers or cognitive limitation. Patient is motivated to learn. Patient understands and agrees with treatment goals and plan. PT explains patient will be examined in standing, sitting, and lying down to see how their muscles and joints work. When they are ready, they will be asked to remove their underwear so PT can examine their perineum. The patient is also given the option of providing their own chaperone as one is not provided in our facility. The patient also has the right and is explained the right to defer or refuse any part of the evaluation or treatment including the internal exam. With the patient's consent, PT will use one gloved finger to gently assess the muscles of the pelvic floor, seeing how well it contracts and relaxes and if there is muscle symmetry. After, the patient will get dressed and PT and patient will discuss exam findings and plan of care. PT and patient discuss plan of care, schedule, attendance policy and HEP activities.  Therapist gloved finger in the rectum working on the puborectalis, distraction of the coccyx, manual work to the anococcygeal ligament Neuromuscular re-education: Pelvic floor contraction training: Therapist gloved finger in the rectum taping the puborectalis to assist with it coming forward, then contract with therapist trying to take her finger out of the canal, 10 quick contractions, 10 contractions holding for 6 seconds Sitting ball squeeze holding 1 sec 10 x with pelvic floor contraction Sit to stand with pelvic floor contraction 10 x    PATIENT EDUCATION:  06/29/23 Education details: Access Code: [email or web address] Person educated: Patient Education method: Explanation, Demonstration, Tactile cues, Verbal cues, and Handouts Education comprehension: verbalized understanding, returned demonstration, verbal cues  required, tactile cues required, and needs further education  HOME EXERCISE PROGRAM: 06/29/23 Access Code: [email or web address] URL: [email or web address] Date: 06/29/2023 Prepared by: Marsha Skeen  Exercises - Sidelying Pelvic Floor Contraction with Self-Palpation  - 3 x daily - 7 x weekly - 1 sets - 10 reps - 5 sec hold - Supine Hip Adduction Isometric with Ball  - 1 x daily - 7 x weekly - 1 sets - 10 reps - 10 sec hold - Seated Hip Adduction Isometrics with Ball  - 1 x daily - 7 x weekly - 1 sets - 10 reps - 1 sec hold - Sit to Stand with Pelvic Floor Contraction  - 1 x daily - 7 x weekly - 1 sets - 10 reps - Supine Bridge with Mini Swiss Ball Between Knees  - 1 x daily - 3 x weekly -  1 sets - 10 reps - Hooklying Isometric Hip Flexion  - 1 x daily - 3 x weekly - 1 sets - 10 reps - Shoulder extension with resistance - Neutral  - 1 x daily - 3 x weekly - 2 sets - 10 reps - Standing Trunk Rotation with Resistance  - 1 x daily - 3 x weekly - 1 sets - 10 reps - Quadruped Alternating Arm Lift  - 1 x daily - 3 x weekly - 1 sets - 10 reps - Quadruped Leg Lifts  - 1 x daily - 3 x weekly - 1 sets - 10 reps  Patient Education - Abdominal Massage for Constipation - Abdominal Massage for Constipation   ASSESSMENT:  CLINICAL IMPRESSION: Patient is a 78 y.o. female who was seen today for physical therapy  treatment for IBS and fecal leakage.   Patient has no urinary leakage. She had fecal leakage this weekend and was after she had a bowel movement. Patient is working on her back strength to reduce her kyphosis and reduce pressure on the pelvic floor. Patient had not leakage with the exercise.  Patient would benefit from skilled therapy to improve pelvic floor coordination and reduce fecal leakage.   OBJECTIVE IMPAIRMENTS: decreased endurance, decreased strength, and increased fascial restrictions.   ACTIVITY LIMITATIONS: continence and toileting  PARTICIPATION LIMITATIONS: community  activity  PERSONAL FACTORS: Fitness, Time since onset of injury/illness/exacerbation, and 3+ comorbidities: Glaucoma; Hypertension; Stroke, Thyroid  are also affecting patient's functional outcome.   REHAB POTENTIAL: Good  CLINICAL DECISION MAKING: Evolving/moderate complexity  EVALUATION COMPLEXITY: Moderate   GOALS: Goals reviewed with patient? Yes  SHORT TERM GOALS: Target date: 05/18/23  Patient is independent with initial HEP.  Baseline: Goal status: Met 06/26/23  2.  Patient is able to contract her pelvic floor for 10 seconds.  Baseline:  Goal status: INITIAL  3.  Patient understands how to perform abdominal massage to assist reduction of straining with bowel movements.  Baseline:  Goal status: Met 06/26/23   LONG TERM GOALS: Target date: 07/13/23  Patient independent with advanced HEP for pelvic floor and core strength.  Baseline:  Goal status: INITIAL  2.  Patient reports her stool leakage decreased >/= 80% due to the puborectalis coming forward.  Baseline:  Goal status: INITIAL  3.  Rectal strength is >/= 3/5 with holding for 40 seconds to improve continence.  Baseline:  Goal status: INITIAL  4.  Patient is able to walk to the bathroom after the urge to have a bowel movement and have no stool leakage.  Baseline:  Goal status: INITIAL   PLAN:  PT FREQUENCY: 1-2x/week  PT DURATION: 12 weeks  PLANNED INTERVENTIONS: 97110-Therapeutic exercises, 97530- Therapeutic activity, 97112- Neuromuscular re-education, 97535- Self Care, 53664- Manual therapy, Patient/Family education, and Biofeedback  PLAN FOR NEXT SESSION: abdominal work, diaphragmatic breathing, core strength  Marsha Skeen, PT 06/29/23 11:46 AM

## 2023-07-01 ENCOUNTER — Ambulatory Visit (HOSPITAL_COMMUNITY)
Admission: RE | Admit: 2023-07-01 | Discharge: 2023-07-01 | Disposition: A | Discharge status: Home / Self Care | Source: Ambulatory Visit | Attending: Vascular Surgery | Admitting: Vascular Surgery

## 2023-07-01 ENCOUNTER — Ambulatory Visit: Admitting: Physician Assistant

## 2023-07-01 VITALS — BP 190/70 | HR 60 | Temp 98.1°F | Wt 89.6 lb

## 2023-07-01 DIAGNOSIS — I6523 Occlusion and stenosis of bilateral carotid arteries: Secondary | ICD-10-CM | POA: Diagnosis not present

## 2023-07-01 DIAGNOSIS — M7989 Other specified soft tissue disorders: Secondary | ICD-10-CM

## 2023-07-01 DIAGNOSIS — I63231 Cerebral infarction due to unspecified occlusion or stenosis of right carotid arteries: Secondary | ICD-10-CM

## 2023-07-01 NOTE — Progress Notes (Signed)
 HISTORY AND PHYSICAL     CC:  follow up. Requesting Provider:  Mariel Shope, DO  HPI: This is a 78 y.o. female here for follow up for carotid artery stenosis.  Pt is s/p  right CEA for symptomatic carotid artery stenosis on 05/02/2020 by Dr. Shirley Douglas.   Pt was last seen 03/13/2021 and at that time her left hand weakness was back to normal and otherwise doing well.    Pt returns today for follow up.    Pt denies any amaurosis fugax, speech difficulties, weakness, numbness, paralysis or clumsiness or facial droop.  She denies any claudication or rest pain.    She does have BLE swelling.  She states it is improved in the am after being in bed.  She does wear knee high compression in the winter months.    The pt is on a statin for cholesterol management.  The pt is on a daily aspirin .   Other AC:  none The pt is on CCB, ARB, hydrochlorothiazide , diuretic for hypertension.   The pt is not on medication for diabetes Tobacco hx:  never  Pt does not have family hx of AAA.  Past Medical History:  Diagnosis Date   Cataract 03/2019   Glaucoma Approximately 2005   Horseshoe retinal tear of left eye 04/02/2021   Hyperlipidemia LDL goal <70 01/11/2021   Hypertension 2022   Long-term use of Plaquenil  04/02/2021   Lupus    new rheum questioning original dx. continuing Plaquenil  at lower dose for now.   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 bladder, acquired    Pseudophakia of both eyes 04/02/2021   Retinal detachment with multiple breaks, right eye 04/02/2021   Stroke (HCC) 04/2020   Thyroid  disease     Past Surgical History:  Procedure Laterality Date   ENDARTERECTOMY Right 05/02/2020   Procedure: RIGHT CAROTID ENDARTERECTOMY;  Surgeon: Mayo Speck, MD;  Location: [facility] OR;  Service: Vascular;  Laterality: Right;   EYE SURGERY  01/2020   LEFT HEART CATH AND CORONARY ANGIOGRAPHY N/A 02/03/2023   Procedure: LEFT HEART CATH AND CORONARY  ANGIOGRAPHY;  Surgeon: Odie Benne, MD;  Location: [facility] INVASIVE CV LAB;  Service: Cardiovascular;  Laterality: N/A;   SCLERAL BUCKLE Right 2004   TUBAL LIGATION  Feb 1982    Allergies  Allergen Reactions   Amlodipine  Other (See Comments)    Bilateral lower extremity edema   Augmentin [Amoxicillin-Pot Clavulanate] Other (See Comments)    unk   Tramadol     Nausea    Flexeril [Cyclobenzaprine] Palpitations    Current Outpatient Medications  Medication Sig Dispense Refill   ARMOUR THYROID  30 MG tablet Take 1 tablet (30 mg total) by mouth daily. 90 tablet 4   aspirin  EC 81 MG tablet Take 1 tablet (81 mg total) by mouth daily. Swallow whole. 30 tablet 11   cetirizine (ZYRTEC) 10 MG tablet Take 10 mg by mouth See admin instructions. Every 36 hours     estradiol  (ESTRACE ) 0.1 MG/GM vaginal cream Use 1/2 gram three times per week at night. 42.5 g 0   famotidine  (PEPCID ) 20 MG tablet Take 1 tablet (20 mg total) by mouth daily. 30 tablet 3   furosemide  (LASIX ) 40 MG tablet Take 40 mg by mouth daily as needed for fluid or edema.     hydrochlorothiazide  (HYDRODIURIL ) 25 MG tablet Take 1 tablet (25 mg total) by mouth daily. 90 tablet 3  hydroxychloroquine  (PLAQUENIL ) 200 MG tablet Take 200 mg by mouth every other day.     latanoprost (XALATAN) 0.005 % ophthalmic solution Place 1 drop into the left eye at bedtime.     Misc Natural Products (AIRBORNE ELDERBERRY) CHEW Chew 1 tablet by mouth in the morning and at bedtime.     pantoprazole  (PROTONIX ) 40 MG tablet Take 1 tablet (40 mg total) by mouth daily. 90 tablet 3   predniSONE  (DELTASONE ) 5 MG tablet 3 tablets once a day for 7 days, 2.5 tablets once a day for 7 days, 2 tablets once a day for 7 days, 1.5 tablets once a day for 7 days, 1 tablet once a day for 7 days, 0.5 tablet once a day for 8 days Orally for 43 days     Probiotic Product (UP4 PROBIOTICS WOMENS PO) Take 1 capsule by mouth daily.     Resveratrol 50 MG CAPS Take 50 mg by  mouth daily.     rosuvastatin  (CRESTOR ) 40 MG tablet Take 1 tablet (40 mg total) by mouth daily. 90 tablet 3   SIMBRINZA 1-0.2 % SUSP Place 1 drop into the left eye 3 (three) times daily.     Sodium Chloride -Xylitol (XLEAR SINUS CARE SPRAY NA) Place 4 sprays into the nose daily as needed (Moisturing).     Ubiquinol 100 MG CAPS Take 100 mg by mouth.     valsartan  (DIOVAN ) 160 MG tablet TAKE 1 TABLET BY MOUTH EVERY EVENING 30 tablet 2   verapamil  (CALAN -SR) 120 MG CR tablet Take 1 tablet (120 mg total) by mouth at bedtime. 90 tablet 1   No current facility-administered medications for this visit.    Family History  Problem Relation Age of Onset   ALS Mother    Early death Mother    Dementia Father    Hypertension Father    Macular degeneration Maternal Aunt    Alcohol abuse Paternal Uncle    Alcohol abuse Paternal Uncle     Social History   Socioeconomic History   Marital status: Married    Spouse name: Hewitt Lou   Number of children: 2   Years of education: Not on file   Highest education level: 12th grade  Occupational History   Occupation: retired   Occupation: retired  Tobacco Use   Smoking status: Never    Passive exposure: Never   Smokeless tobacco: Never  Vaping Use   Vaping status: Never Used  Substance and Sexual Activity   Alcohol use: Never   Drug use: Never   Sexual activity: Not Currently    Birth control/protection: Surgical  Other Topics Concern   Not on file  Social History Narrative   Marital status/children/pets: Married.  2 children.   Education/employment: Retired.  College-educated.   Safety:      -smoke alarm in the home:Yes     - wears seatbelt: Yes     - Feels safe in their relationships: Yes      Social Drivers of Corporate investment banker Strain: Low Risk  (06/24/2023)   Overall Financial Resource Strain (CARDIA)    Difficulty of Paying Living Expenses: Not hard at all  Food Insecurity: No Food Insecurity (06/24/2023)   Hunger Vital  Sign    Worried About Running Out of Food in the Last Year: Never true    Ran Out of Food in the Last Year: Never true  Transportation Needs: No Transportation Needs (06/24/2023)   PRAPARE - Transportation    Lack of Transportation (  Medical): No    Lack of Transportation (Non-Medical): No  Physical Activity: Unknown (06/24/2023)   Exercise Vital Sign    Days of Exercise per Week: 0 days    Minutes of Exercise per Session: Patient declined  Stress: No Stress Concern Present (06/24/2023)   [employer] of Occupational Health - Occupational Stress Questionnaire    Feeling of Stress : Only a little  Social Connections: Moderately Isolated (06/24/2023)   Social Connection and Isolation Panel    Frequency of Communication with Friends and Family: Once a week    Frequency of Social Gatherings with Friends and Family: Never    Attends Religious Services: More than 4 times per year    Active Member of [employer] or Organizations: No    Attends Banker Meetings: Never    Marital Status: Married  Catering manager Violence: Not At Risk (06/24/2023)   Humiliation, Afraid, Rape, and Kick questionnaire    Fear of Current or Ex-Partner: No    Emotionally Abused: No    Physically Abused: No    Sexually Abused: No     REVIEW OF SYSTEMS:   [X]  denotes positive finding, [ ]  denotes negative finding Cardiac  Comments:  Chest pain or chest pressure:    Shortness of breath upon exertion:    Short of breath when lying flat:    Irregular heart rhythm:        Vascular    Pain in calf, thigh, or hip brought on by ambulation:    Pain in feet at night that wakes you up from your sleep:     Blood clot in your veins:    Leg swelling:  x       Pulmonary    Oxygen at home:    Productive cough:     Wheezing:         Neurologic    Sudden weakness in arms or legs:     Sudden numbness in arms or legs:     Sudden onset of difficulty speaking or slurred speech:    Temporary loss of vision in  one eye:     Problems with dizziness:         Gastrointestinal    Blood in stool:     Vomited blood:         Genitourinary    Burning when urinating:     Blood in urine:        Psychiatric    Major depression:         Hematologic    Bleeding problems:    Problems with blood clotting too easily:        Skin    Rashes or ulcers:        Constitutional    Fever or chills:      PHYSICAL EXAMINATION:  Today's Vitals   07/01/23 1043 07/01/23 1046  BP: (!) 186/61 (!) 190/70  Pulse: 60   Temp: 98.1 F (36.7 C)   TempSrc: Temporal   SpO2: 98%   Weight: 89 lb 9.6 oz (40.6 kg)   PainSc: 0-No pain    Body mass index is 16.93 kg/m.   General:  WDWN in NAD; vital signs documented above Gait: Not observed HENT: WNL, normocephalic Pulmonary: normal non-labored breathing Cardiac: regular HR, without carotid bruits Abdomen: soft, NT; aortic pulse is not palpable Skin: without rashes Vascular Exam/Pulses:  Right Left  Radial 2+ (normal) 2+ (normal)  Popliteal Unable to palpate Unable to palpate  DP 2+ (normal)  2+ (normal)   Extremities: without open wounds; mild BLE swelling Musculoskeletal: no muscle wasting or atrophy  Neurologic: A&O X 3; moving all extremities equally; speech is fluent/normal Psychiatric:  The pt has Normal affect.   Non-Invasive Vascular Imaging:   Carotid Duplex on 07/01/2023 Right:  1-39% ICA stenosis Left:  1-39% ICA stenosis   Previous Carotid duplex on 04/24/2022: Right: 1-39% ICA stenosis Left:   1-39% ICA stenosis    ASSESSMENT/PLAN:: 78 y.o. female here for follow up carotid artery stenosis and has hx of right CEA for symptomatic carotid artery stenosis on 05/02/2020 by Dr. Shirley Douglas.   Carotid stenosis -duplex today reveals bilateral 1-39% ICA stenosis and she remains asymptomatic.  -discussed s/s of stroke with pt and she understands should she develop any of these sx, she will go to the nearest ER or call 911. -pt will f/u in one  year with carotid duplex -pt will call sooner should she have any issues. -continue statin/asa   Hypertension -her blood pressure is elevated today.  She states that she was taking her blood pressure a couple times a day and recording it but she hasn't done this in a couple of weeks.  States systolic was around 130.  I have asked her to restart this and record her blood pressure as she does have appt with cardiology next month.    BLE leg swelling -she does have some swelling in both legs that improves with elevation -discussed with her that if this ever gets bothersome, we could do a venous reflux study to determine if she is a candidate for laser ablation.  Discussed if she is a candidate, she would need to wear thigh high compression for at least 3 months and then return for MD visit. She expressed good understanding.   Discussed continuing with compression and elevation.    Maryanna Smart, [facility] [phone]  Clinic MD:  Vikki Graves

## 2023-07-02 DIAGNOSIS — H33021 Retinal detachment with multiple breaks, right eye: Secondary | ICD-10-CM | POA: Diagnosis not present

## 2023-07-02 DIAGNOSIS — H401133 Primary open-angle glaucoma, bilateral, severe stage: Secondary | ICD-10-CM | POA: Diagnosis not present

## 2023-07-02 DIAGNOSIS — Z961 Presence of intraocular lens: Secondary | ICD-10-CM | POA: Diagnosis not present

## 2023-07-02 DIAGNOSIS — H33312 Horseshoe tear of retina without detachment, left eye: Secondary | ICD-10-CM | POA: Diagnosis not present

## 2023-07-02 DIAGNOSIS — Z79899 Other long term (current) drug therapy: Secondary | ICD-10-CM | POA: Diagnosis not present

## 2023-07-02 DIAGNOSIS — H35371 Puckering of macula, right eye: Secondary | ICD-10-CM | POA: Diagnosis not present

## 2023-07-03 ENCOUNTER — Encounter: Payer: Self-pay | Admitting: Physical Therapy

## 2023-07-03 ENCOUNTER — Ambulatory Visit: Admitting: Physical Therapy

## 2023-07-03 DIAGNOSIS — M6281 Muscle weakness (generalized): Secondary | ICD-10-CM | POA: Diagnosis not present

## 2023-07-03 DIAGNOSIS — R278 Other lack of coordination: Secondary | ICD-10-CM

## 2023-07-03 NOTE — Therapy (Signed)
 OUTPATIENT PHYSICAL THERAPY FEMALE PELVIC TREATMENT   Patient Name: Wendy Jackson MRN: [id number] DOB:[birth year], 78 y.o., female Today's Date: 07/03/2023  END OF SESSION:  PT End of Session - 07/03/23 1044     Visit Number 6    Date for PT Re-Evaluation 07/13/23    Authorization Type [employer] medicare    Authorization - Visit Number 6    Authorization - Number of Visits 10    PT Start Time 1050    PT Stop Time 1130    PT Time Calculation (min) 40 min    Activity Tolerance Patient tolerated treatment well    Behavior During Therapy [facility] for tasks assessed/performed          Past Medical History:  Diagnosis Date   Cataract 03/2019   Glaucoma Approximately 2005   Horseshoe retinal tear of left eye 04/02/2021   Hyperlipidemia LDL goal <70 01/11/2021   Hypertension 2022   Long-term use of Plaquenil  04/02/2021   Lupus    new rheum questioning original dx. continuing Plaquenil  at lower dose for now.   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 bladder, acquired    Pseudophakia of both eyes 04/02/2021   Retinal detachment with multiple breaks, right eye 04/02/2021   Stroke (HCC) 04/2020   Thyroid  disease    Past Surgical History:  Procedure Laterality Date   ENDARTERECTOMY Right 05/02/2020   Procedure: RIGHT CAROTID ENDARTERECTOMY;  Surgeon: Mayo Speck, MD;  Location: [facility] OR;  Service: Vascular;  Laterality: Right;   EYE SURGERY  01/2020   LEFT HEART CATH AND CORONARY ANGIOGRAPHY N/A 02/03/2023   Procedure: LEFT HEART CATH AND CORONARY ANGIOGRAPHY;  Surgeon: Odie Benne, MD;  Location: [facility] INVASIVE CV LAB;  Service: Cardiovascular;  Laterality: N/A;   SCLERAL BUCKLE Right 2004   TUBAL LIGATION  Feb 1982   Patient Active Problem List   Diagnosis Date Noted   Incontinence of feces with fecal urgency 05/05/2023   Gastroesophageal reflux disease with esophagitis without hemorrhage 05/05/2023   Bloating 05/05/2023   Other  specified abnormal immunological findings in serum 04/03/2023   Elevated erythrocyte sedimentation rate 04/03/2023   High coronary artery calcium  score: 1525; 96th percentile 01/29/2023   SVT (supraventricular tachycardia) (HCC) 12/31/2022   Other fatigue 12/31/2022   Decreased exercise tolerance 12/31/2022   Polymyalgia rheumatica (HCC) 11/25/2022   Polyarthralgia 11/25/2022   Other secondary scoliosis, lumbar region 11/17/2022   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   Other female genital prolapse 10/29/2021   Immunization not carried out because of parent refusal 05/06/2021   Long-term use of Plaquenil  04/02/2021   Primary open angle glaucoma (POAG) of both eyes, severe stage 04/02/2021   Primary hypertension 01/11/2021   Hyperlipidemia LDL goal <70 01/11/2021   Acquired thrombophilia (HCC) 12/11/2020   BMI less than 19,adult 12/11/2020   Hypothyroidism 12/11/2020   History of CVA (cerebrovascular accident) 05/02/2020   Lupus    Allergic rhinitis    CAD (coronary artery disease) 05/20/2018    PCP: Mariel Shope, DO  REFERRING PROVIDER: Sergio Dandy, MD   REFERRING DIAG:  K58.1 (ICD-10-CM) - Irritable bowel syndrome with constipation  R15.9,R15.2 (ICD-10-CM) - Incontinence of feces with fecal urgency    THERAPY DIAG:  Muscle weakness (generalized)  Other lack of coordination  Rationale for Evaluation and Treatment: Rehabilitation  ONSET DATE: 1/25  SUBJECTIVE:  SUBJECTIVE STATEMENT:I have been doing my exercises and it is helping. I have started to use the estradiol  cream.    PAIN:  Are you having pain? No  PRECAUTIONS: None  RED FLAGS: None   WEIGHT BEARING RESTRICTIONS: No  FALLS:  Has patient fallen in last 6 months? No  OCCUPATION: retired  ACTIVITY LEVEL : tries to  exercise, walks  PLOF: Independent  PATIENT GOALS: reduce the fecal leakage  PERTINENT HISTORY:  Glaucoma; Hypertension; Stroke, Thyroid ;  Sexual abuse: No  BOWEL MOVEMENT: Pain with bowel movement: No Type of bowel movement:Type (Bristol Stool Scale) type 4, Frequency 1-2 days, Strain some straining, and Splinting none Fully empty rectum: Yes:   Leakage: Yes: right after a bowel movement Pads: Yes: 1 per day and uses tissue paper so she is not changing the pad as often Fiber supplement/laxative prebiotic fiber  URINATION: Pain with urination: No Fully empty bladder: No sometimes she does not; pessary of the prolapse Stream: Strong Urgency: sometimes mostly at night Frequency: every 2-3 hours; night 2-3 times Leakage: Urge to void and Walking to the bathroom 06/29/23; no urinary leakage at this time Pads: Yes: 1 pad per night  INTERCOURSE: not active   PREGNANCY: Vaginal deliveries 2 Tearing Yes:   Episiotomy No  PROLAPSE: Patient is wearing a pessary    OBJECTIVE:  Note: Objective measures were completed at Evaluation unless otherwise noted.  DIAGNOSTIC FINDINGS:  None  PATIENT SURVEYS:  CRAIQ-7: 10 PFIQ-7: 6  COGNITION: Overall cognitive status: Within functional limits for tasks assessed     SENSATION: Light touch: Appears intact    GAIT: Assistive device utilized: None  POSTURE: rounded shoulders, forward head, increased thoracic kyphosis, and posterior pelvic tilt   LUMBARAROM/PROM:  A/PROM A/PROM  eval  Flexion full  Extension Decreased by 25%  Right lateral flexion Decreased by 25%  Left lateral flexion Decreased by 25%  Right rotation Decreased by 25%  Left rotation Decreased by 25%   (Blank rows = not tested)  LOWER EXTREMITY ROM: bilateral hip ROM is full   LOWER EXTREMITY MMT: bilateral hip strength is 4/5 07/03/23: bilateral hip 4/5  PALPATION:    Abdominal: decreased lower abdominal contractions                External  Perineal Exam: the anus is open                             Internal Pelvic Floor: the puborectalis has difficulty to move forward  Patient confirms identification and approves PT to assess internal pelvic floor and treatment Yes  PELVIC MMT:   MMT eval 06/22/23  Internal Anal Sphincter 2/5, 5 s 3/5 weak hug of therapist finger, 6 sec 3 x  External Anal Sphincter 2/5, 5 s 3/5 weak hug of therapist finger, 6 sec 3 x  Puborectalis 2/5, 5 s 3/5 weak hug of therapist finger, 6 sec 3 x  (Blank rows = not tested)        TONE: Low tone   TODAY'S TREATMENT:   07/03/23 Exercises: Strengthening: Nustep level 3 for 7 minutes while assessing patient Supine heel slides with foot on ball and core engaged 15 x each leg Supine bridge with ball squeeze 20 x  Supine march with blue band around knees and squeeze ball between hands while bringing ball overhead 20 x Sit to stand holding 1 # in each hand 15 x with therapist giving tactile cues to  flex at hips and retract her scapula Bilateral shoulder extension with yellow band 20 x with erect posture  06/29/23 Exercises: Strengthening: Nustep level 3 for 7 minutes while assessing patient Supine ball squeeze holding 5 sec 10 x for pelvic floor contraction Bridge with ball squeeze 15 x  Supine hip flexion isometric to engage the lower abdominals 20 x  Quadruped lift arm 15 x each then lift leg 15 X each Standing bilateral shoulder extension with red band 15 x  Standing trunk rotation resistance 10 x each way with red band to engage the core    06/26/23 Manual: Soft tissue mobilization: Circular massage to abdomen to improve peristalic motion of the intestines Manual work to the diaphragm to improve the rib angle and expansion of the lower rib cage for diaphragmatic breathing Scar tissue mobilization: Manual work to lower abdominal scar going through the restrictions and pulling upward Myofascial release: Tissue rolling of the abdominal tissue  to reduce restrictions Fascial release around the lower abdomen to release around the colon Exercises: Strengthening: Nustep level 5 for 6 minutes while assessing patient Supine ball squeeze holding 5 sec 10 x for pelvic floor contraction Bridge with ball squeeze 10 x  Supine hip flexion isometric to engage the lower abdominals Self-care: Educated patient on how to place the estradiol  on the vaginal canal, vulvar and urethra to assist with the health of the tissue      PATIENT EDUCATION:  06/29/23 Education details: Access Code: [email or web address] Person educated: Patient Education method: Explanation, Demonstration, Tactile cues, Verbal cues, and Handouts Education comprehension: verbalized understanding, returned demonstration, verbal cues required, tactile cues required, and needs further education  HOME EXERCISE PROGRAM: 06/29/23 Access Code: [email or web address] URL: [email or web address] Date: 06/29/2023 Prepared by: Marsha Skeen  Exercises - Sidelying Pelvic Floor Contraction with Self-Palpation  - 3 x daily - 7 x weekly - 1 sets - 10 reps - 5 sec hold - Supine Hip Adduction Isometric with Ball  - 1 x daily - 7 x weekly - 1 sets - 10 reps - 10 sec hold - Seated Hip Adduction Isometrics with Ball  - 1 x daily - 7 x weekly - 1 sets - 10 reps - 1 sec hold - Sit to Stand with Pelvic Floor Contraction  - 1 x daily - 7 x weekly - 1 sets - 10 reps - Supine Bridge with Mini Swiss Ball Between Knees  - 1 x daily - 3 x weekly - 1 sets - 10 reps - Hooklying Isometric Hip Flexion  - 1 x daily - 3 x weekly - 1 sets - 10 reps - Shoulder extension with resistance - Neutral  - 1 x daily - 3 x weekly - 2 sets - 10 reps - Standing Trunk Rotation with Resistance  - 1 x daily - 3 x weekly - 1 sets - 10 reps - Quadruped Alternating Arm Lift  - 1 x daily - 3 x weekly - 1 sets - 10 reps - Quadruped Leg Lifts  - 1 x daily - 3 x weekly - 1 sets - 10 reps  Patient Education - Abdominal Massage for  Constipation - Abdominal Massage for Constipation   ASSESSMENT:  CLINICAL IMPRESSION: Patient is a 78 y.o. female who was seen today for physical therapy  treatment for IBS and fecal leakage.   Patient has no urinary leakage. She had fecal leakage this weekend and was after she had a bowel movement. Patient is working on her back strength to reduce her kyphosis  and reduce pressure on the pelvic floor. Her hip strength is 4/5. She is more aware of standing upright. Patient had not leakage with the exercise.  Patient would benefit from skilled therapy to improve pelvic floor coordination and reduce fecal leakage.   OBJECTIVE IMPAIRMENTS: decreased endurance, decreased strength, and increased fascial restrictions.   ACTIVITY LIMITATIONS: continence and toileting  PARTICIPATION LIMITATIONS: community activity  PERSONAL FACTORS: Fitness, Time since onset of injury/illness/exacerbation, and 3+ comorbidities: Glaucoma; Hypertension; Stroke, Thyroid  are also affecting patient's functional outcome.   REHAB POTENTIAL: Good  CLINICAL DECISION MAKING: Evolving/moderate complexity  EVALUATION COMPLEXITY: Moderate   GOALS: Goals reviewed with patient? Yes  SHORT TERM GOALS: Target date: 05/18/23  Patient is independent with initial HEP.  Baseline: Goal status: Met 06/26/23  2.  Patient is able to contract her pelvic floor for 10 seconds.  Baseline:  Goal status: INITIAL  3.  Patient understands how to perform abdominal massage to assist reduction of straining with bowel movements.  Baseline:  Goal status: Met 06/26/23   LONG TERM GOALS: Target date: 07/13/23  Patient independent with advanced HEP for pelvic floor and core strength.  Baseline:  Goal status: INITIAL  2.  Patient reports her stool leakage decreased >/= 80% due to the puborectalis coming forward.  Baseline:  Goal status: INITIAL  3.  Rectal strength is >/= 3/5 with holding for 40 seconds to improve continence.   Baseline:  Goal status: INITIAL  4.  Patient is able to walk to the bathroom after the urge to have a bowel movement and have no stool leakage.  Baseline:  Goal status: INITIAL   PLAN:  PT FREQUENCY: 1-2x/week  PT DURATION: 12 weeks  PLANNED INTERVENTIONS: 97110-Therapeutic exercises, 97530- Therapeutic activity, [id number]- Neuromuscular re-education, 97535- Self Care, [id number]- Manual therapy, Patient/Family education, and Biofeedback  PLAN FOR NEXT SESSION:  core strength, update goals  Marsha Skeen, PT 07/03/23 11:30 AM

## 2023-07-06 ENCOUNTER — Encounter: Payer: Self-pay | Admitting: Physical Therapy

## 2023-07-06 ENCOUNTER — Ambulatory Visit: Admitting: Physical Therapy

## 2023-07-06 DIAGNOSIS — R278 Other lack of coordination: Secondary | ICD-10-CM

## 2023-07-06 DIAGNOSIS — M6281 Muscle weakness (generalized): Secondary | ICD-10-CM | POA: Diagnosis not present

## 2023-07-06 NOTE — Therapy (Signed)
 OUTPATIENT PHYSICAL THERAPY FEMALE PELVIC TREATMENT   Patient Name: Wendy Jackson MRN: [id number] DOB:[birth year], 78 y.o., female Today's Date: 07/06/2023  END OF SESSION:  PT End of Session - 07/06/23 1102     Visit Number 7    Date for PT Re-Evaluation 07/13/23    Authorization Type [employer] medicare    Authorization - Visit Number 7    Authorization - Number of Visits 10    PT Start Time 1100    PT Stop Time 1140    PT Time Calculation (min) 40 min    Activity Tolerance Patient tolerated treatment well    Behavior During Therapy [facility] for tasks assessed/performed          Past Medical History:  Diagnosis Date   Cataract 03/2019   Glaucoma Approximately 2005   [place] retinal tear of left eye 04/02/2021   Hyperlipidemia LDL goal <70 01/11/2021   Hypertension 2022   Long-term use of Plaquenil  04/02/2021   Lupus    new rheum questioning original dx. continuing Plaquenil  at lower dose for now.   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 bladder, acquired    Pseudophakia of both eyes 04/02/2021   Retinal detachment with multiple breaks, right eye 04/02/2021   Stroke (HCC) 04/2020   Thyroid  disease    Past Surgical History:  Procedure Laterality Date   ENDARTERECTOMY Right 05/02/2020   Procedure: RIGHT CAROTID ENDARTERECTOMY;  Surgeon: Oris Krystal FALCON, MD;  Location[facility] OR;  Service: Vascular;  Laterality: Right;   EYE SURGERY  01/2020   LEFT HEART CATH AND CORONARY ANGIOGRAPHY N/A 02/03/2023   Procedure: LEFT HEART CATH AND CORONARY ANGIOGRAPHY;  Surgeon: Verlin Lonni BIRCH, MD;  Location: [facility] INVASIVE CV LAB;  Service: Cardiovascular;  Laterality: N/A;   SCLERAL BUCKLE Right 2004   TUBAL LIGATION  Feb 1982   Patient Active Problem List   Diagnosis Date Noted   Incontinence of feces with fecal urgency 05/05/2023   Gastroesophageal reflux disease with esophagitis without hemorrhage 05/05/2023   Bloating 05/05/2023   Other  specified abnormal immunological findings in serum 04/03/2023   Elevated erythrocyte sedimentation rate 04/03/2023   High coronary artery calcium  score: 1525; 96th percentile 01/29/2023   SVT (supraventricular tachycardia) (HCC) 12/31/2022   Other fatigue 12/31/2022   Decreased exercise tolerance 12/31/2022   Polymyalgia rheumatica (HCC) 11/25/2022   Polyarthralgia 11/25/2022   Other secondary scoliosis, lumbar region 11/17/2022   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   Other female genital prolapse 10/29/2021   Immunization not carried out because of parent refusal 05/06/2021   Long-term use of Plaquenil  04/02/2021   Primary open angle glaucoma (POAG) of both eyes, severe stage 04/02/2021   Primary hypertension 01/11/2021   Hyperlipidemia LDL goal <70 01/11/2021   Acquired thrombophilia (HCC) 12/11/2020   BMI less than 19,adult 12/11/2020   Hypothyroidism 12/11/2020   History of CVA (cerebrovascular accident) 05/02/2020   Lupus    Allergic rhinitis    CAD (coronary artery disease) 05/20/2018    PCP: Catherine Charlies LABOR, DO  REFERRING PROVIDER: Shila Gustav GAILS, MD   REFERRING DIAG:  K58.1 (ICD-10-CM) - Irritable bowel syndrome with constipation  R15.9,R15.2 (ICD-10-CM) - Incontinence of feces with fecal urgency    THERAPY DIAG:  Muscle weakness (generalized)  Other lack of coordination  Rationale for Evaluation and Treatment: Rehabilitation  ONSET DATE: 1/25  SUBJECTIVE:  SUBJECTIVE STATEMENT:I have been doing my exercises and it is helping. I have started to use the estradiol  cream.    PAIN:  Are you having pain? No  PRECAUTIONS: None  RED FLAGS: None   WEIGHT BEARING RESTRICTIONS: No  FALLS:  Has patient fallen in last 6 months? No  OCCUPATION: retired  ACTIVITY LEVEL : tries to  exercise, walks  PLOF: Independent  PATIENT GOALS: reduce the fecal leakage  PERTINENT HISTORY:  Glaucoma; Hypertension; Stroke, Thyroid ;  Sexual abuse: No  BOWEL MOVEMENT: Pain with bowel movement: No Type of bowel movement:Type (Bristol Stool Scale) type 4, Frequency 1-2 days, Strain some straining, and Splinting none Fully empty rectum: Yes:   Leakage: Yes: right after a bowel movement Pads: Yes: 1 per day and uses tissue paper so she is not changing the pad as often Fiber supplement/laxative prebiotic fiber  URINATION: Pain with urination: No Fully empty bladder: No sometimes she does not; pessary of the prolapse Stream: Strong Urgency: sometimes mostly at night Frequency: every 2-3 hours; night 2-3 times Leakage: Urge to void and Walking to the bathroom 06/29/23; no urinary leakage at this time Pads: Yes: 1 pad per night  INTERCOURSE: not active   PREGNANCY: Vaginal deliveries 2 Tearing Yes:   Episiotomy No  PROLAPSE: Patient is wearing a pessary    OBJECTIVE:  Note: Objective measures were completed at Evaluation unless otherwise noted.  DIAGNOSTIC FINDINGS:  None  PATIENT SURVEYS:  CRAIQ-7: 10 PFIQ-7: 6  COGNITION: Overall cognitive status: Within functional limits for tasks assessed     SENSATION: Light touch: Appears intact    GAIT: Assistive device utilized: None  POSTURE: rounded shoulders, forward head, increased thoracic kyphosis, and posterior pelvic tilt   LUMBARAROM/PROM:  A/PROM A/PROM  eval  Flexion full  Extension Decreased by 25%  Right lateral flexion Decreased by 25%  Left lateral flexion Decreased by 25%  Right rotation Decreased by 25%  Left rotation Decreased by 25%   (Blank rows = not tested)  LOWER EXTREMITY ROM: bilateral hip ROM is full   LOWER EXTREMITY MMT: bilateral hip strength is 4/5 07/03/23: bilateral hip 4/5  PALPATION:    Abdominal: decreased lower abdominal contractions                External  Perineal Exam: the anus is open                             Internal Pelvic Floor: the puborectalis has difficulty to move forward  Patient confirms identification and approves PT to assess internal pelvic floor and treatment Yes  PELVIC MMT:   MMT eval 06/22/23  Internal Anal Sphincter 2/5, 5 s 3/5 weak hug of therapist finger, 6 sec 3 x  External Anal Sphincter 2/5, 5 s 3/5 weak hug of therapist finger, 6 sec 3 x  Puborectalis 2/5, 5 s 3/5 weak hug of therapist finger, 6 sec 3 x  (Blank rows = not tested)        TONE: Low tone   TODAY'S TREATMENT:  07/06/23 Exercises: Strengthening: Nustep level 3 for 8 minutes while assessing patient Sit to stand holding 1 # in each hand 15 x with therapist giving tactile cues to flex at hips and retract her scapula Bilateral shoulder extension with yellow band 20 x with erect posture standing trunk rotation resistance 10 x each way with yellow band to engage the core Bridge with ball squeeze 15 x holding 1 #  in each hand and bring arms overhead Supine march with blue band around knees and squeeze ball between hands while bringing ball overhead 20 x Supine heel slides with feet on slider and squeeze ball between hands with core engaged 2 x 10 each     07/03/23 Exercises: Strengthening: Nustep level 3 for 7 minutes while assessing patient Supine heel slides with foot on ball and core engaged 15 x each leg Supine bridge with ball squeeze 20 x  Supine march with blue band around knees and squeeze ball between hands while bringing ball overhead 20 x Sit to stand holding 1 # in each hand 15 x with therapist giving tactile cues to flex at hips and retract her scapula Bilateral shoulder extension with yellow band 20 x with erect posture  06/29/23 Exercises: Strengthening: Nustep level 3 for 7 minutes while assessing patient Supine ball squeeze holding 5 sec 10 x for pelvic floor contraction Bridge with ball squeeze 15 x  Supine hip flexion  isometric to engage the lower abdominals 20 x  Quadruped lift arm 15 x each then lift leg 15 X each Standing bilateral shoulder extension with red band 15 x  Standing trunk rotation resistance 10 x each way with red band to engage the core      PATIENT EDUCATION:  06/29/23 Education details: Access Code: [email or web address] Person educated: Patient Education method: Explanation, Demonstration, Tactile cues, Verbal cues, and Handouts Education comprehension: verbalized understanding, returned demonstration, verbal cues required, tactile cues required, and needs further education  HOME EXERCISE PROGRAM: 06/29/23 Access Code: [email or web address] URL: [email or web address] Date: 06/29/2023 Prepared by: Channing Pereyra  Exercises - Sidelying Pelvic Floor Contraction with Self-Palpation  - 3 x daily - 7 x weekly - 1 sets - 10 reps - 5 sec hold - Supine Hip Adduction Isometric with Ball  - 1 x daily - 7 x weekly - 1 sets - 10 reps - 10 sec hold - Seated Hip Adduction Isometrics with Ball  - 1 x daily - 7 x weekly - 1 sets - 10 reps - 1 sec hold - Sit to Stand with Pelvic Floor Contraction  - 1 x daily - 7 x weekly - 1 sets - 10 reps - Supine Bridge with Mini Swiss Ball Between Knees  - 1 x daily - 3 x weekly - 1 sets - 10 reps - Hooklying Isometric Hip Flexion  - 1 x daily - 3 x weekly - 1 sets - 10 reps - Shoulder extension with resistance - Neutral  - 1 x daily - 3 x weekly - 2 sets - 10 reps - Standing Trunk Rotation with Resistance  - 1 x daily - 3 x weekly - 1 sets - 10 reps - Quadruped Alternating Arm Lift  - 1 x daily - 3 x weekly - 1 sets - 10 reps - Quadruped Leg Lifts  - 1 x daily - 3 x weekly - 1 sets - 10 reps  Patient Education - Abdominal Massage for Constipation - Abdominal Massage for Constipation   ASSESSMENT:  CLINICAL IMPRESSION: Patient is a 78 y.o. female who was seen today for physical therapy  treatment for IBS and fecal leakage.   Stool leakage is 50% better.   Patient  continues to have no urinary leakage. Patient is able to engage her core with her exercises. She needed minimal verbal cues for the exercises. She started to do some exercises at home. She is able to walk to the bathroom without stool leakage after she  has the urge to have a bowel movement. Patient would benefit from skilled therapy to improve pelvic floor coordination and reduce fecal leakage.   OBJECTIVE IMPAIRMENTS: decreased endurance, decreased strength, and increased fascial restrictions.   ACTIVITY LIMITATIONS: continence and toileting  PARTICIPATION LIMITATIONS: community activity  PERSONAL FACTORS: Fitness, Time since onset of injury/illness/exacerbation, and 3+ comorbidities: Glaucoma; Hypertension; Stroke, Thyroid  are also affecting patient's functional outcome.   REHAB POTENTIAL: Good  CLINICAL DECISION MAKING: Evolving/moderate complexity  EVALUATION COMPLEXITY: Moderate   GOALS: Goals reviewed with patient? Yes  SHORT TERM GOALS: Target date: 05/18/23  Patient is independent with initial HEP.  Baseline: Goal status: Met 06/26/23  2.  Patient is able to contract her pelvic floor for 10 seconds.  Baseline:  Goal status: INITIAL  3.  Patient understands how to perform abdominal massage to assist reduction of straining with bowel movements.  Baseline:  Goal status: Met 06/26/23   LONG TERM GOALS: Target date: 07/13/23  Patient independent with advanced HEP for pelvic floor and core strength.  Baseline:  Goal status: INITIAL  2.  Patient reports her stool leakage decreased >/= 80% due to the puborectalis coming forward.  Baseline:  Goal status: INITIAL  3.  Rectal strength is >/= 3/5 with holding for 40 seconds to improve continence.  Baseline:  Goal status: INITIAL  4.  Patient is able to walk to the bathroom after the urge to have a bowel movement and have no stool leakage.  Baseline:  Goal status: Met 07/06/23   PLAN:  PT FREQUENCY: 1-2x/week  PT  DURATION: 12 weeks  PLANNED INTERVENTIONS: 97110-Therapeutic exercises, 97530- Therapeutic activity, 97112- Neuromuscular re-education, 97535- Self Care, [id number]- Manual therapy, Patient/Family education, and Biofeedback  PLAN FOR NEXT SESSION:  core strength, update goals, see if she is to continue  Channing Pereyra, PT 07/06/23 11:44 AM

## 2023-07-08 ENCOUNTER — Ambulatory Visit: Admitting: Physical Therapy

## 2023-07-08 ENCOUNTER — Encounter: Payer: Self-pay | Admitting: Physical Therapy

## 2023-07-08 DIAGNOSIS — H524 Presbyopia: Secondary | ICD-10-CM | POA: Diagnosis not present

## 2023-07-08 DIAGNOSIS — R278 Other lack of coordination: Secondary | ICD-10-CM

## 2023-07-08 DIAGNOSIS — M6281 Muscle weakness (generalized): Secondary | ICD-10-CM | POA: Diagnosis not present

## 2023-07-08 DIAGNOSIS — H5201 Hypermetropia, right eye: Secondary | ICD-10-CM | POA: Diagnosis not present

## 2023-07-08 DIAGNOSIS — H52222 Regular astigmatism, left eye: Secondary | ICD-10-CM | POA: Diagnosis not present

## 2023-07-08 NOTE — Therapy (Signed)
 OUTPATIENT PHYSICAL THERAPY FEMALE PELVIC TREATMENT   Patient Name: Wendy Jackson MRN: [id number] DOB:[birth year], 78 y.o., female Today's Date: 07/08/2023  END OF SESSION:  PT End of Session - 07/08/23 1234     Visit Number 8    Date for PT Re-Evaluation 10/05/23    Authorization Type [employer] medicare    Authorization - Visit Number 8    Authorization - Number of Visits 10    PT Start Time 1230    PT Stop Time 1310    PT Time Calculation (min) 40 min    Activity Tolerance Patient tolerated treatment well    Behavior During Therapy [facility] for tasks assessed/performed          Past Medical History:  Diagnosis Date   Cataract 03/2019   Glaucoma Approximately 2005   [place] retinal tear of left eye 04/02/2021   Hyperlipidemia LDL goal <70 01/11/2021   Hypertension 2022   Long-term use of Plaquenil  04/02/2021   Lupus    new rheum questioning original dx. continuing Plaquenil  at lower dose for now.   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 bladder, acquired    Pseudophakia of both eyes 04/02/2021   Retinal detachment with multiple breaks, right eye 04/02/2021   Stroke (HCC) 04/2020   Thyroid  disease    Past Surgical History:  Procedure Laterality Date   ENDARTERECTOMY Right 05/02/2020   Procedure: RIGHT CAROTID ENDARTERECTOMY;  Surgeon: Oris Krystal FALCON, MD;  Location: [facility] OR;  Service: Vascular;  Laterality: Right;   EYE SURGERY  01/2020   LEFT HEART CATH AND CORONARY ANGIOGRAPHY N/A 02/03/2023   Procedure: LEFT HEART CATH AND CORONARY ANGIOGRAPHY;  Surgeon: Verlin Lonni BIRCH, MD;  Location: [facility] INVASIVE CV LAB;  Service: Cardiovascular;  Laterality: N/A;   SCLERAL BUCKLE Right 2004   TUBAL LIGATION  Feb 1982   Patient Active Problem List   Diagnosis Date Noted   Incontinence of feces with fecal urgency 05/05/2023   Gastroesophageal reflux disease with esophagitis without hemorrhage 05/05/2023   Bloating 05/05/2023   Other  specified abnormal immunological findings in serum 04/03/2023   Elevated erythrocyte sedimentation rate 04/03/2023   High coronary artery calcium  score: 1525; 96th percentile 01/29/2023   SVT (supraventricular tachycardia) (HCC) 12/31/2022   Other fatigue 12/31/2022   Decreased exercise tolerance 12/31/2022   Polymyalgia rheumatica (HCC) 11/25/2022   Polyarthralgia 11/25/2022   Other secondary scoliosis, lumbar region 11/17/2022   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   Other female genital prolapse 10/29/2021   Immunization not carried out because of parent refusal 05/06/2021   Long-term use of Plaquenil  04/02/2021   Primary open angle glaucoma (POAG) of both eyes, severe stage 04/02/2021   Primary hypertension 01/11/2021   Hyperlipidemia LDL goal <70 01/11/2021   Acquired thrombophilia (HCC) 12/11/2020   BMI less than 19,adult 12/11/2020   Hypothyroidism 12/11/2020   History of CVA (cerebrovascular accident) 05/02/2020   Lupus    Allergic rhinitis    CAD (coronary artery disease) 05/20/2018    PCP: Catherine Charlies LABOR, DO  REFERRING PROVIDER: Shila Gustav GAILS, MD   REFERRING DIAG:  K58.1 (ICD-10-CM) - Irritable bowel syndrome with constipation  R15.9,R15.2 (ICD-10-CM) - Incontinence of feces with fecal urgency    THERAPY DIAG:  Muscle weakness (generalized) - Plan: PT plan of care cert/re-cert  Other lack of coordination - Plan: PT plan of care cert/re-cert  Rationale for Evaluation and Treatment: Rehabilitation  ONSET DATE: 1/25  SUBJECTIVE:  SUBJECTIVE STATEMENT:I have been doing my exercises and it is helping. I have started to use the estradiol  cream.    PAIN:  Are you having pain? No  PRECAUTIONS: None  RED FLAGS: None   WEIGHT BEARING RESTRICTIONS: No  FALLS:  Has patient fallen  in last 6 months? No  OCCUPATION: retired  ACTIVITY LEVEL : tries to exercise, walks  PLOF: Independent  PATIENT GOALS: reduce the fecal leakage  PERTINENT HISTORY:  Glaucoma; Hypertension; Stroke, Thyroid ;  Sexual abuse: No  BOWEL MOVEMENT: Pain with bowel movement: No Type of bowel movement:Type (Bristol Stool Scale) type 4, Frequency 1-2 days, Strain some straining, and Splinting none Fully empty rectum: Yes:   Leakage: Yes: right after a bowel movement 07/08/23: leakage after having a bowel movement Pads: Yes: 1 per day and uses tissue paper so she is not changing the pad as often Fiber supplement/laxative prebiotic fiber  URINATION: Pain with urination: No Fully empty bladder: No sometimes she does not; pessary of the prolapse Stream: Strong Urgency: sometimes mostly at night Frequency: every 2-3 hours; night 2-3 times Leakage: Urge to void and Walking to the bathroom 06/29/23; no urinary leakage at this time 07/08/23: no urinary leakage Pads: Yes: 1 pad per night  INTERCOURSE: not active   PREGNANCY: Vaginal deliveries 2 Tearing Yes:   Episiotomy No  PROLAPSE: Patient is wearing a pessary    OBJECTIVE:  Note: Objective measures were completed at Evaluation unless otherwise noted.  DIAGNOSTIC FINDINGS:  None  PATIENT SURVEYS:  CRAIQ-7: 10 PFIQ-7: 6 07/08/23: CRAIQ-7: 14 PFIQ-7: 19 due to patient being frustrated and nervous about the leakage after she has a bowel movement  COGNITION: Overall cognitive status: Within functional limits for tasks assessed     SENSATION: Light touch: Appears intact    GAIT: Assistive device utilized: None  POSTURE: rounded shoulders, forward head, increased thoracic kyphosis, and posterior pelvic tilt   LUMBARAROM/PROM:  A/PROM A/PROM  eval A/PROM  07/08/23  Flexion full full  Extension Decreased by 25% Decreased by 25%  Right lateral flexion Decreased by 25% Decreased by 25%  Left lateral flexion Decreased  by 25% Decreased by 25%  Right rotation Decreased by 25% Decreased by 25%  Left rotation Decreased by 25% Decreased by 25%   (Blank rows = not tested)  LOWER EXTREMITY ROM: bilateral hip ROM is full   LOWER EXTREMITY MMT: bilateral hip strength is 4/5 07/03/23: bilateral hip 4/5  PALPATION:    Abdominal: decreased lower abdominal contractions                External Perineal Exam: the anus is open                             Internal Pelvic Floor: the puborectalis has difficulty to move forward  Patient confirms identification and approves PT to assess internal pelvic floor and treatment Yes  PELVIC MMT:   MMT eval 06/22/23 07/08/23  Internal Anal Sphincter 2/5, 5 s 3/5 weak hug of therapist finger, 6 sec 3 x 3/5 weak hug of therapist finger,   External Anal Sphincter 2/5, 5 s 3/5 weak hug of therapist finger, 6 sec 3 x 3/5 weak hug of therapist finger,   Puborectalis 2/5, 5 s 3/5 weak hug of therapist finger, 6 sec 3 x 3/5 weak hug of therapist finger,   (Blank rows = not tested)        TONE: Low tone  TODAY'S TREATMENT:  07/08/23 Manual: Internal pelvic floor techniques: No emotional/communication barriers or cognitive limitation. Patient is motivated to learn. Patient understands and agrees with treatment goals and plan. PT explains patient will be examined in standing, sitting, and lying down to see how their muscles and joints work. When they are ready, they will be asked to remove their underwear so PT can examine their perineum. The patient is also given the option of providing their own chaperone as one is not provided in our facility. The patient also has the right and is explained the right to defer or refuse any part of the evaluation or treatment including the internal exam. With the patient's consent, PT will use one gloved finger to gently assess the muscles of the pelvic floor, seeing how well it contracts and relaxes and if there is muscle symmetry. After, the  patient will get dressed and PT and patient will discuss exam findings and plan of care. PT and patient discuss plan of care, schedule, attendance policy and HEP activities.  Therapist gloved finger in the rectum working on the anococcygeal ligament, puborectalis to elongate for contraction.  Neuromuscular re-education: Pelvic floor contraction training: Patient laying on right side-Therapist gloved finger in the rectum giving tactile cues for contractions holding 6 seconds 10 x, quick flicks 10 x, contract rectum with hip ER 10 x, contract pelvic floor with shoulder horizontal abduction  Exercises: Strengthening: Nustep level 5 for 8 minutes while assessing patient Sitting pelvic floor contraction holding 10 seconds 10 x  Sit to stand holding 1 # in each hand 15 x with therapist giving tactile cues to flex at hips and retract her scapula    07/06/23 Exercises: Strengthening: Nustep level 3 for 8 minutes while assessing patient Sit to stand holding 1 # in each hand 15 x with therapist giving tactile cues to flex at hips and retract her scapula Bilateral shoulder extension with yellow band 20 x with erect posture standing trunk rotation resistance 10 x each way with yellow band to engage the core Bridge with ball squeeze 15 x holding 1 # in each hand and bring arms overhead Supine march with blue band around knees and squeeze ball between hands while bringing ball overhead 20 x Supine heel slides with feet on slider and squeeze ball between hands with core engaged 2 x 10 each     07/03/23 Exercises: Strengthening: Nustep level 3 for 7 minutes while assessing patient Supine heel slides with foot on ball and core engaged 15 x each leg Supine bridge with ball squeeze 20 x  Supine march with blue band around knees and squeeze ball between hands while bringing ball overhead 20 x Sit to stand holding 1 # in each hand 15 x with therapist giving tactile cues to flex at hips and retract her  scapula Bilateral shoulder extension with yellow band 20 x with erect posture     PATIENT EDUCATION:  06/29/23 Education details: Access Code: [email or web address] Person educated: Patient Education method: Explanation, Demonstration, Tactile cues, Verbal cues, and Handouts Education comprehension: verbalized understanding, returned demonstration, verbal cues required, tactile cues required, and needs further education  HOME EXERCISE PROGRAM: 06/29/23 Access Code: [email or web address] URL: [email or web address] Date: 06/29/2023 Prepared by: Channing Pereyra  Exercises - Sidelying Pelvic Floor Contraction with Self-Palpation  - 3 x daily - 7 x weekly - 1 sets - 10 reps - 5 sec hold - Supine Hip Adduction Isometric with Ball  - 1 x daily - 7 x weekly - 1 sets -  10 reps - 10 sec hold - Seated Hip Adduction Isometrics with Ball  - 1 x daily - 7 x weekly - 1 sets - 10 reps - 1 sec hold - Sit to Stand with Pelvic Floor Contraction  - 1 x daily - 7 x weekly - 1 sets - 10 reps - Supine Bridge with Mini Swiss Ball Between Knees  - 1 x daily - 3 x weekly - 1 sets - 10 reps - Hooklying Isometric Hip Flexion  - 1 x daily - 3 x weekly - 1 sets - 10 reps - Shoulder extension with resistance - Neutral  - 1 x daily - 3 x weekly - 2 sets - 10 reps - Standing Trunk Rotation with Resistance  - 1 x daily - 3 x weekly - 1 sets - 10 reps - Quadruped Alternating Arm Lift  - 1 x daily - 3 x weekly - 1 sets - 10 reps - Quadruped Leg Lifts  - 1 x daily - 3 x weekly - 1 sets - 10 reps  Patient Education - Abdominal Massage for Constipation - Abdominal Massage for Constipation   ASSESSMENT:  CLINICAL IMPRESSION: Patient is a 78 y.o. female who was seen today for physical therapy  treatment for IBS and fecal leakage.  Pelvic floor strength is 3/5 holding for 6 seconds. She has difficulty contracting pelvic floor with arm or leg movement.  Stool leakage is 60% better.   Patient continues to have no urinary leakage.  She is  able to walk to the bathroom without stool leakage after she has the urge to have a bowel movement. Patient is steadier doing sit to stand holding weights. Patient would benefit from skilled therapy to improve pelvic floor coordination and reduce fecal leakage.   OBJECTIVE IMPAIRMENTS: decreased endurance, decreased strength, and increased fascial restrictions.   ACTIVITY LIMITATIONS: continence and toileting  PARTICIPATION LIMITATIONS: community activity  PERSONAL FACTORS: Fitness, Time since onset of injury/illness/exacerbation, and 3+ comorbidities: Glaucoma; Hypertension; Stroke, Thyroid  are also affecting patient's functional outcome.   REHAB POTENTIAL: Good  CLINICAL DECISION MAKING: Evolving/moderate complexity  EVALUATION COMPLEXITY: Moderate   GOALS: Goals reviewed with patient? Yes  SHORT TERM GOALS: Target date: 05/18/23  Patient is independent with initial HEP.  Baseline: Goal status: Met 06/26/23  2.  Patient is able to contract her pelvic floor for 10 seconds.  Baseline:  Goal status: ongoing 07/08/23  3.  Patient understands how to perform abdominal massage to assist reduction of straining with bowel movements.  Baseline:  Goal status: Met 06/26/23   LONG TERM GOALS: Target date: 10/05/23  Patient independent with advanced HEP for pelvic floor and core strength.  Baseline:  Goal status: ongoing 07/08/23  2.  Patient reports her stool leakage decreased >/= 80% due to the puborectalis coming forward.  Baseline: improved by 60% Goal status: ongoing 07/08/23  3.  Rectal strength is >/= 3/5 with holding for 40 seconds to improve continence.  Baseline: contact 6 seconds Goal status: Ongoing 07/08/23  4.  Patient is able to walk to the bathroom after the urge to have a bowel movement and have no stool leakage.  Baseline:  Goal status: Met 07/06/23   PLAN:  PT FREQUENCY: 1-2x/week  PT DURATION: 12 weeks  PLANNED INTERVENTIONS: 97110-Therapeutic exercises,  97530- Therapeutic activity, 97112- Neuromuscular re-education, 97535- Self Care, [id number]- Manual therapy, Patient/Family education, and Biofeedback  PLAN FOR NEXT SESSION:  core strength, hip strength, pelvic floor strength  Channing Pereyra, PT 07/08/23 1:11  PM

## 2023-07-09 DIAGNOSIS — K080 Exfoliation of teeth due to systemic causes: Secondary | ICD-10-CM | POA: Diagnosis not present

## 2023-07-15 NOTE — Progress Notes (Unsigned)
 Cardiology Office Note:   Date:  07/16/2023  ID:  Wendy Jackson, DOB 06-Oct-1945, MRN [id number] PCP: Catherine Charlies LABOR, DO  [facility] Providers Cardiologist:  Lynwood Schilling, MD {  History of Present Illness:   Wendy Jackson is a 78 y.o. female who presents for evaluation of fatigue and difficult to control hypertension.  She has a history of CVA 2022.  She has had PVD with with right carotid endarterectomy. A previous echocardiogram at that time which was fairly unremarkable.    She has had multiple manipulations blood pressure medicines trying to find something will control her blood pressure.  She reports it to be spiking but I was able to review the readings from her phone and her average blood pressures in the 131/61 range.  She does fluctuate from systolics to the mid to high 140s down to the 120s.  However, it seems to be relatively well-controlled on the current regimen of medicines.    With her decreased exercise tolerance I did send her for a PET perfusion study and this was low risk with a normal EF.   I sent her for renal Doppler and she had no stenosis although there is plaque in the SMA.  She also had fatigue and monitor demonstrated occasional SVT with the longest lasting 11 minutes.     She had CAD with cath demonstrating RCA 70% stenosis, PDA 70% stenosis, proximal circumflex 80% stenosis, proximal and ostial LAD LAD has 50% stenosis followed by mid 90% stenosis.  First diagonal was 80% stenosis.SABRA   She was managed medically.    She saw Dr. Shyrl.  She was discussed at the, heart, team, meeting and it was agreed that she would continue medical management.  After the last echo the EF was NL with no significant valvular abnormalities.    She moved to [place] this week.  The patient denies any new symptoms such as chest discomfort, neck or arm discomfort. There has been no new shortness of breath, PND or orthopnea. There have been no reported palpitations, presyncope or  syncope.   She is fatigued.  BP has been elevated at home but seems to be trending down.    ROS: As stated in the HPI and negative for all other systems.  Studies Reviewed:    EKG:   EKG Interpretation Date/Time:  Thursday July 16 2023 09:38:20 EDT Ventricular Rate:  66 PR Interval:  124 QRS Duration:  92 QT Interval:  438 QTC Calculation: 459 R Axis:   69  Text Interpretation: Sinus rhythm with Premature atrial complexes Nonspecific ST abnormality When compared with ECG of 02-Feb-2023 09:55, No significant change was found Confirmed by Schilling Lynwood ([id number]) on 07/16/2023 9:39:18 AM    Risk Assessment/Calculations:              Physical Exam:   VS:  BP 114/72   Pulse 66   Ht 5' 1 (1.549 m)   Wt 89 lb (40.4 kg)   SpO2 99%   BMI 16.82 kg/m    Wt Readings from Last 3 Encounters:  07/16/23 89 lb (40.4 kg)  07/01/23 89 lb 9.6 oz (40.6 kg)  05/12/23 88 lb 12.8 oz (40.3 kg)     GEN: Well nourished, well developed in no acute distress NECK: No JVD; No carotid bruits CARDIAC: RRR, no murmurs, rubs, gallops RESPIRATORY:  Clear to auscultation without rales, wheezing or rhonchi  ABDOMEN: Soft, non-tender, non-distended EXTREMITIES:  Leg swelling ** edema; No deformity   ASSESSMENT AND  PLAN:   CAD:  The patient has no new sypmtoms.  No further cardiovascular testing is indicated.  We will continue with aggressive risk reduction and meds as listed.=   Carotid stenosis:   This was mild and followed by [facility].     Hypertension: Her blood pressure is trending down and she does have follow-up in [facility].  For now I will not make any med changes but she will present there with more blood pressure readings and hopefully as she settles into her new place the at home pressures will come down.  Arrhythmia: She does have premature atrial contractions.  She is not bothered by these.  No change in therapy.    Fatigue:   Unfortunately this is a significant ongoing  complaint without clear etiology.  Labs have been unremarkable.  No further cardiac workup.   Follow up with me in 6 months  Signed, Lynwood Schilling, MD

## 2023-07-16 ENCOUNTER — Ambulatory Visit: Attending: Cardiology | Admitting: Cardiology

## 2023-07-16 ENCOUNTER — Encounter: Payer: Self-pay | Admitting: Cardiology

## 2023-07-16 VITALS — BP 114/72 | HR 66 | Ht 61.0 in | Wt 89.0 lb

## 2023-07-16 DIAGNOSIS — I25119 Atherosclerotic heart disease of native coronary artery with unspecified angina pectoris: Secondary | ICD-10-CM

## 2023-07-16 DIAGNOSIS — I10 Essential (primary) hypertension: Secondary | ICD-10-CM

## 2023-07-16 DIAGNOSIS — R5383 Other fatigue: Secondary | ICD-10-CM | POA: Diagnosis not present

## 2023-07-16 NOTE — Patient Instructions (Signed)

## 2023-07-27 ENCOUNTER — Ambulatory Visit: Payer: Self-pay | Attending: Gastroenterology | Admitting: Physical Therapy

## 2023-07-27 ENCOUNTER — Encounter: Payer: Self-pay | Admitting: Physical Therapy

## 2023-07-27 DIAGNOSIS — M6281 Muscle weakness (generalized): Secondary | ICD-10-CM | POA: Diagnosis not present

## 2023-07-27 DIAGNOSIS — R278 Other lack of coordination: Secondary | ICD-10-CM | POA: Diagnosis not present

## 2023-07-27 NOTE — Therapy (Signed)
 OUTPATIENT PHYSICAL THERAPY FEMALE PELVIC TREATMENT   Patient Name: Wendy Jackson MRN: [id number] DOB:10-27-1945, 78 y.o., female Today's Date: 07/27/2023  END OF SESSION:  PT End of Session - 07/27/23 1404     Visit Number 9    Date for PT Re-Evaluation 10/05/23    Authorization Type [employer] medicare    Authorization - Visit Number 9    Authorization - Number of Visits 10    PT Start Time 1400    PT Stop Time 1440    PT Time Calculation (min) 40 min    Activity Tolerance Patient tolerated treatment well    Behavior During Therapy [facility] for tasks assessed/performed          Past Medical History:  Diagnosis Date   Cataract 03/2019   Glaucoma Approximately 2005   Horseshoe retinal tear of left eye 04/02/2021   Hyperlipidemia LDL goal <70 01/11/2021   Hypertension 2022   Long-term use of Plaquenil  04/02/2021   Lupus    new rheum questioning original dx. continuing Plaquenil  at lower dose for now.   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 bladder, acquired    Pseudophakia of both eyes 04/02/2021   Retinal detachment with multiple breaks, right eye 04/02/2021   Stroke (HCC) 04/2020   Thyroid  disease    Past Surgical History:  Procedure Laterality Date   ENDARTERECTOMY Right 05/02/2020   Procedure: RIGHT CAROTID ENDARTERECTOMY;  Surgeon: Oris Krystal FALCON, MD;  Location: [facility] OR;  Service: Vascular;  Laterality: Right;   EYE SURGERY  01/2020   LEFT HEART CATH AND CORONARY ANGIOGRAPHY N/A 02/03/2023   Procedure: LEFT HEART CATH AND CORONARY ANGIOGRAPHY;  Surgeon: Verlin Lonni BIRCH, MD;  Location: [facility] INVASIVE CV LAB;  Service: Cardiovascular;  Laterality: N/A;   SCLERAL BUCKLE Right 2004   TUBAL LIGATION  Feb 1982   Patient Active Problem List   Diagnosis Date Noted   Incontinence of feces with fecal urgency 05/05/2023   Gastroesophageal reflux disease with esophagitis without hemorrhage 05/05/2023   Bloating 05/05/2023   Other  specified abnormal immunological findings in serum 04/03/2023   Elevated erythrocyte sedimentation rate 04/03/2023   High coronary artery calcium  score: 1525; 96th percentile 01/29/2023   SVT (supraventricular tachycardia) (HCC) 12/31/2022   Other fatigue 12/31/2022   Decreased exercise tolerance 12/31/2022   Polymyalgia rheumatica (HCC) 11/25/2022   Polyarthralgia 11/25/2022   Other secondary scoliosis, lumbar region 11/17/2022   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   Other female genital prolapse 10/29/2021   Immunization not carried out because of parent refusal 05/06/2021   Long-term use of Plaquenil  04/02/2021   Primary open angle glaucoma (POAG) of both eyes, severe stage 04/02/2021   Primary hypertension 01/11/2021   Hyperlipidemia LDL goal <70 01/11/2021   Acquired thrombophilia (HCC) 12/11/2020   BMI less than 19,adult 12/11/2020   Hypothyroidism 12/11/2020   History of CVA (cerebrovascular accident) 05/02/2020   Lupus    Allergic rhinitis    CAD (coronary artery disease) 05/20/2018    PCP: Catherine Charlies LABOR, DO  REFERRING PROVIDER: Shila Gustav GAILS, MD   REFERRING DIAG:  K58.1 (ICD-10-CM) - Irritable bowel syndrome with constipation  R15.9,R15.2 (ICD-10-CM) - Incontinence of feces with fecal urgency    THERAPY DIAG:  Muscle weakness (generalized)  Other lack of coordination  Rationale for Evaluation and Treatment: Rehabilitation  ONSET DATE: 1/25  SUBJECTIVE:  SUBJECTIVE STATEMENT: I have been to several exercises. Sometimes I do not feel when I have stool that leaked out. I have to wipe often after a bowel movement.    PAIN:  Are you having pain? No  PRECAUTIONS: None  RED FLAGS: None   WEIGHT BEARING RESTRICTIONS: No  FALLS:  Has patient fallen in last 6 months?  No  OCCUPATION: retired  ACTIVITY LEVEL : tries to exercise, walks  PLOF: Independent  PATIENT GOALS: reduce the fecal leakage  PERTINENT HISTORY:  Glaucoma; Hypertension; Stroke, Thyroid ;  Sexual abuse: No  BOWEL MOVEMENT: Pain with bowel movement: No Type of bowel movement:Type (Bristol Stool Scale) type 4, Frequency 1-2 days, Strain some straining, and Splinting none Fully empty rectum: Yes:   Leakage: Yes: right after a bowel movement 07/08/23: leakage after having a bowel movement Pads: Yes: 1 per day and uses tissue paper so she is not changing the pad as often Fiber supplement/laxative prebiotic fiber  URINATION: Pain with urination: No Fully empty bladder: No sometimes she does not; pessary of the prolapse Stream: Strong Urgency: sometimes mostly at night Frequency: every 2-3 hours; night 2-3 times Leakage: Urge to void and Walking to the bathroom 06/29/23; no urinary leakage at this time 07/08/23: no urinary leakage Pads: Yes: 1 pad per night  INTERCOURSE: not active   PREGNANCY: Vaginal deliveries 2 Tearing Yes:   Episiotomy No  PROLAPSE: Patient is wearing a pessary    OBJECTIVE:  Note: Objective measures were completed at Evaluation unless otherwise noted.  DIAGNOSTIC FINDINGS:  None  PATIENT SURVEYS:  CRAIQ-7: 10 PFIQ-7: 6 07/08/23: CRAIQ-7: 14 PFIQ-7: 19 due to patient being frustrated and nervous about the leakage after she has a bowel movement  COGNITION: Overall cognitive status: Within functional limits for tasks assessed     SENSATION: Light touch: Appears intact    GAIT: Assistive device utilized: None  POSTURE: rounded shoulders, forward head, increased thoracic kyphosis, and posterior pelvic tilt   LUMBARAROM/PROM:  A/PROM A/PROM  eval A/PROM  07/08/23  Flexion full full  Extension Decreased by 25% Decreased by 25%  Right lateral flexion Decreased by 25% Decreased by 25%  Left lateral flexion Decreased by 25% Decreased by  25%  Right rotation Decreased by 25% Decreased by 25%  Left rotation Decreased by 25% Decreased by 25%   (Blank rows = not tested)  LOWER EXTREMITY ROM: bilateral hip ROM is full   LOWER EXTREMITY MMT: bilateral hip strength is 4/5 07/03/23: bilateral hip 4/5  PALPATION:    Abdominal: decreased lower abdominal contractions                External Perineal Exam: the anus is open                             Internal Pelvic Floor: the puborectalis has difficulty to move forward  Patient confirms identification and approves PT to assess internal pelvic floor and treatment Yes  PELVIC MMT:   MMT eval 06/22/23 07/08/23  Internal Anal Sphincter 2/5, 5 s 3/5 weak hug of therapist finger, 6 sec 3 x 3/5 weak hug of therapist finger,   External Anal Sphincter 2/5, 5 s 3/5 weak hug of therapist finger, 6 sec 3 x 3/5 weak hug of therapist finger,   Puborectalis 2/5, 5 s 3/5 weak hug of therapist finger, 6 sec 3 x 3/5 weak hug of therapist finger,   (Blank rows = not tested)  TONE: Low tone   TODAY'S TREATMENT:  07/27/23 Exercises: Stretches/mobility: Laying prone to elongate the anterior trunk Prone on elbows to increase thoracic extension Strengthening: ( engage core and pelvic floor)  Nustep level 5 for 8 minutes while assessing patient Quadruped lift arm 10 x each with core engaged Quadruped lift leg up 10 x each with core engaged Sit to stand holding 5# wts working on control and upright posture Side step holding 5# 10 x each Standing alternate shoulder and hip flexion holding 1 # 20 x Holding 2 # making A-Z     07/08/23 Manual: Internal pelvic floor techniques: No emotional/communication barriers or cognitive limitation. Patient is motivated to learn. Patient understands and agrees with treatment goals and plan. PT explains patient will be examined in standing, sitting, and lying down to see how their muscles and joints work. When they are ready, they will be asked to  remove their underwear so PT can examine their perineum. The patient is also given the option of providing their own chaperone as one is not provided in our facility. The patient also has the right and is explained the right to defer or refuse any part of the evaluation or treatment including the internal exam. With the patient's consent, PT will use one gloved finger to gently assess the muscles of the pelvic floor, seeing how well it contracts and relaxes and if there is muscle symmetry. After, the patient will get dressed and PT and patient will discuss exam findings and plan of care. PT and patient discuss plan of care, schedule, attendance policy and HEP activities.  Therapist gloved finger in the rectum working on the anococcygeal ligament, puborectalis to elongate for contraction.  Neuromuscular re-education: Pelvic floor contraction training: Patient laying on right side-Therapist gloved finger in the rectum giving tactile cues for contractions holding 6 seconds 10 x, quick flicks 10 x, contract rectum with hip ER 10 x, contract pelvic floor with shoulder horizontal abduction  Exercises: Strengthening: Nustep level 5 for 8 minutes while assessing patient Sitting pelvic floor contraction holding 10 seconds 10 x  Sit to stand holding 1 # in each hand 15 x with therapist giving tactile cues to flex at hips and retract her scapula    07/06/23 Exercises: Strengthening: Nustep level 3 for 8 minutes while assessing patient Sit to stand holding 1 # in each hand 15 x with therapist giving tactile cues to flex at hips and retract her scapula Bilateral shoulder extension with yellow band 20 x with erect posture standing trunk rotation resistance 10 x each way with yellow band to engage the core Bridge with ball squeeze 15 x holding 1 # in each hand and bring arms overhead Supine march with blue band around knees and squeeze ball between hands while bringing ball overhead 20 x Supine heel slides with  feet on slider and squeeze ball between hands with core engaged 2 x 10 each     07/03/23 Exercises: Strengthening: Nustep level 3 for 7 minutes while assessing patient Supine heel slides with foot on ball and core engaged 15 x each leg Supine bridge with ball squeeze 20 x  Supine march with blue band around knees and squeeze ball between hands while bringing ball overhead 20 x Sit to stand holding 1 # in each hand 15 x with therapist giving tactile cues to flex at hips and retract her scapula Bilateral shoulder extension with yellow band 20 x with erect posture     PATIENT EDUCATION:  06/29/23  Education details: Access Code: [email or web address] Person educated: Patient Education method: Explanation, Demonstration, Tactile cues, Verbal cues, and Handouts Education comprehension: verbalized understanding, returned demonstration, verbal cues required, tactile cues required, and needs further education  HOME EXERCISE PROGRAM: 06/29/23 Access Code: [email or web address] URL: [email or web address] Date: 06/29/2023 Prepared by: Channing Pereyra  Exercises - Sidelying Pelvic Floor Contraction with Self-Palpation  - 3 x daily - 7 x weekly - 1 sets - 10 reps - 5 sec hold - Supine Hip Adduction Isometric with Ball  - 1 x daily - 7 x weekly - 1 sets - 10 reps - 10 sec hold - Seated Hip Adduction Isometrics with Ball  - 1 x daily - 7 x weekly - 1 sets - 10 reps - 1 sec hold - Sit to Stand with Pelvic Floor Contraction  - 1 x daily - 7 x weekly - 1 sets - 10 reps - Supine Bridge with Mini Swiss Ball Between Knees  - 1 x daily - 3 x weekly - 1 sets - 10 reps - Hooklying Isometric Hip Flexion  - 1 x daily - 3 x weekly - 1 sets - 10 reps - Shoulder extension with resistance - Neutral  - 1 x daily - 3 x weekly - 2 sets - 10 reps - Standing Trunk Rotation with Resistance  - 1 x daily - 3 x weekly - 1 sets - 10 reps - Quadruped Alternating Arm Lift  - 1 x daily - 3 x weekly - 1 sets - 10 reps - Quadruped Leg Lifts   - 1 x daily - 3 x weekly - 1 sets - 10 reps  Patient Education - Abdominal Massage for Constipation - Abdominal Massage for Constipation   ASSESSMENT:  CLINICAL IMPRESSION: Patient is a 78 y.o. female who was seen today for physical therapy  treatment for IBS and fecal leakage.  She is leaking stool after she has had a bowel movement. She will wait 2 minutes after bowel movement to see if this will assist with this. Patient is able to stand more erect now. She is not out of breath after using the nustep.  Patient is steadier doing sit to stand holding weights. Patient would benefit from skilled therapy to improve pelvic floor coordination and reduce fecal leakage.   OBJECTIVE IMPAIRMENTS: decreased endurance, decreased strength, and increased fascial restrictions.   ACTIVITY LIMITATIONS: continence and toileting  PARTICIPATION LIMITATIONS: community activity  PERSONAL FACTORS: Fitness, Time since onset of injury/illness/exacerbation, and 3+ comorbidities: Glaucoma; Hypertension; Stroke, Thyroid  are also affecting patient's functional outcome.   REHAB POTENTIAL: Good  CLINICAL DECISION MAKING: Evolving/moderate complexity  EVALUATION COMPLEXITY: Moderate   GOALS: Goals reviewed with patient? Yes  SHORT TERM GOALS: Target date: 05/18/23  Patient is independent with initial HEP.  Baseline: Goal status: Met 06/26/23  2.  Patient is able to contract her pelvic floor for 10 seconds.  Baseline:  Goal status: ongoing 07/08/23  3.  Patient understands how to perform abdominal massage to assist reduction of straining with bowel movements.  Baseline:  Goal status: Met 06/26/23   LONG TERM GOALS: Target date: 10/05/23  Patient independent with advanced HEP for pelvic floor and core strength.  Baseline:  Goal status: ongoing 07/08/23  2.  Patient reports her stool leakage decreased >/= 80% due to the puborectalis coming forward.  Baseline: improved by 60% Goal status: ongoing  07/08/23  3.  Rectal strength is >/= 3/5 with holding for 40 seconds to improve continence.  Baseline: contact 6 seconds  Goal status: Ongoing 07/08/23  4.  Patient is able to walk to the bathroom after the urge to have a bowel movement and have no stool leakage.  Baseline:  Goal status: Met 07/06/23   PLAN:  PT FREQUENCY: 1-2x/week  PT DURATION: 12 weeks  PLANNED INTERVENTIONS: 97110-Therapeutic exercises, 97530- Therapeutic activity, 97112- Neuromuscular re-education, 97535- Self Care, [id number]- Manual therapy, Patient/Family education, and Biofeedback  PLAN FOR NEXT SESSION:  core strength, hip strength, pelvic floor strength, see if she will leak stool more as the day progresses.  Channing Pereyra, PT 07/27/23 2:05 PM

## 2023-07-28 ENCOUNTER — Ambulatory Visit: Attending: Cardiology | Admitting: Pharmacist

## 2023-07-28 VITALS — BP 169/75

## 2023-07-28 DIAGNOSIS — I10 Essential (primary) hypertension: Secondary | ICD-10-CM

## 2023-07-28 MED ORDER — VALSARTAN 80 MG PO TABS: ORAL_TABLET | ORAL | 1 refills | Status: DC

## 2023-07-28 MED ORDER — VALSARTAN 160 MG PO TABS: 160.0000 mg | ORAL_TABLET | Freq: Every evening | ORAL | 1 refills | Status: DC

## 2023-07-28 NOTE — Progress Notes (Unsigned)
 Patient ID: Wendy Jackson                 DOB: 07-23-1945                      MRN: [id number]     HPI: Wendy Jackson is a 78 y.o. female referred by Dr. Lavona to HTN clinic. PMH is significant for CAD, HTN, SVT, PMR, and history of CVA.  Patient presents today with husband. Has history of elevate blood pressure. Currently on daily prednisone  for PMR which may be contributing.  Brought home BP readings:  7/15: 133/59 7/14: 136/62, 134/59 7/13: 127/58, 145/59 7/12: 127/61, 125/54 7/11: 141/64, 137/65 7/10: 131/59, 181/84 7/9: 127/60, 138/62  Currently managed on valsartan  160mg , hydrochlorothiazide  25mg , and verapamil  120mg  once daily. Reports no adverse effects.  Currently taking exercise classes. Trying to avoid excess sodium intake. Has started drinking protein shakes as a meal supplement.   BP measurements have historically been elevated in clinic.  Current HTN meds:  Valsartan  160mg  Hydrochlorothiazide  25mg  daily Verapamil  120mg  daily   Previously tried: Amlodipine  (edema) BP goal: <130/80  Wt Readings from Last 3 Encounters:  07/16/23 89 lb (40.4 kg)  07/01/23 89 lb 9.6 oz (40.6 kg)  05/12/23 88 lb 12.8 oz (40.3 kg)   BP Readings from Last 3 Encounters:  07/16/23 114/72  07/01/23 (!) 190/70  06/23/23 124/84   Pulse Readings from Last 3 Encounters:  07/16/23 66  07/01/23 60  06/23/23 69    Renal function: CrCl cannot be calculated (Patient's most recent lab result is older than the maximum 21 days allowed.).  Past Medical History:  Diagnosis Date   Cataract 03/2019   Glaucoma Approximately 2005   Horseshoe retinal tear of left eye 04/02/2021   Hyperlipidemia LDL goal <70 01/11/2021   Hypertension 2022   Long-term use of Plaquenil  04/02/2021   Lupus    new rheum questioning original dx. continuing Plaquenil  at lower dose for now.   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 bladder, acquired    Pseudophakia of both  eyes 04/02/2021   Retinal detachment with multiple breaks, right eye 04/02/2021   Stroke (HCC) 04/2020   Thyroid  disease     Current Outpatient Medications on File Prior to Visit  Medication Sig Dispense Refill   ARMOUR THYROID  30 MG tablet Take 1 tablet (30 mg total) by mouth daily. 90 tablet 4   aspirin  EC 81 MG tablet Take 1 tablet (81 mg total) by mouth daily. Swallow whole. 30 tablet 11   cetirizine (ZYRTEC) 10 MG tablet Take 10 mg by mouth See admin instructions. Every 36 hours     estradiol  (ESTRACE ) 0.1 MG/GM vaginal cream Use 1/2 gram three times per week at night. 42.5 g 0   furosemide  (LASIX ) 40 MG tablet Take 40 mg by mouth daily as needed for fluid or edema.     hydrochlorothiazide  (HYDRODIURIL ) 25 MG tablet Take 1 tablet (25 mg total) by mouth daily. 90 tablet 3   hydroxychloroquine  (PLAQUENIL ) 200 MG tablet Take 200 mg by mouth every other day.     latanoprost (XALATAN) 0.005 % ophthalmic solution Place 1 drop into the left eye at bedtime.     Misc Natural Products (AIRBORNE ELDERBERRY) CHEW Chew 1 tablet by mouth in the morning and at bedtime.     pantoprazole  (PROTONIX ) 40 MG tablet Take 1 tablet (40 mg total) by mouth daily. 90 tablet  3   Probiotic Product (UP4 PROBIOTICS WOMENS PO) Take 1 capsule by mouth daily.     Resveratrol 50 MG CAPS Take 50 mg by mouth daily.     rosuvastatin  (CRESTOR ) 40 MG tablet Take 1 tablet (40 mg total) by mouth daily. 90 tablet 3   SIMBRINZA 1-0.2 % SUSP Place 1 drop into the left eye 3 (three) times daily.     Sodium Chloride -Xylitol (XLEAR SINUS CARE SPRAY NA) Place 4 sprays into the nose daily as needed (Moisturing).     Ubiquinol 100 MG CAPS Take 100 mg by mouth.     valsartan  (DIOVAN ) 160 MG tablet TAKE 1 TABLET BY MOUTH EVERY EVENING 30 tablet 2   verapamil  (CALAN -SR) 120 MG CR tablet Take 1 tablet (120 mg total) by mouth at bedtime. 90 tablet 1   No current facility-administered medications on file prior to visit.    Allergies   Allergen Reactions   Amlodipine  Other (See Comments)    Bilateral lower extremity edema   Augmentin [Amoxicillin-Pot Clavulanate] Other (See Comments)    unk   Tramadol     Nausea    Flexeril [Cyclobenzaprine] Palpitations     Assessment/Plan:  1. Hypertension - Patient BP in room 169/75 (improved from initial reading of 191/71) which is above goal of <130/80. White coat HTN likely playing contributing as well as prednisone . Home readings better controlled but still above goal. Will have patient start valsartan  80mg  in morning and continue to monitor at home. Recheck in 4 weeks.  Start valsartan  80mg  in morning. Continue 160mg  in evening Continue hydrochlorothiazide  25mg  in morning Continue verapamil  120mg  in evening Recheck in 4 weeks  Medford Bolk, PharmD, [place], CDCES, CPP [facility] [address], [place], [place] [address] Phone: [phone]; Fax: [phone] 08/05/2023 9:29 AM

## 2023-07-28 NOTE — Patient Instructions (Addendum)
 Good seeing you two again  We would like your blood pressure to be less than 130/80  We will increase your valsartan  to 80mg  in the morning along with your hydrochlorothiazide  25mg    In the evening, continue your valsartan  160mg  along with your verapamil   Continue checking your blood pressure at home. Continue exercise classes and try to watch your salt intake  Let us  know if  you have any questions or concerns and we will see you back in a month  Chris Amaya Blakeman, PharmD, BCACP, CDCES, CPP [facility] [address], [place], [place] [address] Phone: [phone]; Fax: [phone] 07/28/2023 2:25 PM

## 2023-07-30 DIAGNOSIS — R700 Elevated erythrocyte sedimentation rate: Secondary | ICD-10-CM | POA: Diagnosis not present

## 2023-07-30 DIAGNOSIS — R5383 Other fatigue: Secondary | ICD-10-CM | POA: Diagnosis not present

## 2023-07-30 DIAGNOSIS — M353 Polymyalgia rheumatica: Secondary | ICD-10-CM | POA: Diagnosis not present

## 2023-07-30 DIAGNOSIS — Z79899 Other long term (current) drug therapy: Secondary | ICD-10-CM | POA: Diagnosis not present

## 2023-08-03 ENCOUNTER — Ambulatory Visit: Payer: Self-pay | Admitting: Physical Therapy

## 2023-08-03 ENCOUNTER — Encounter: Payer: Self-pay | Admitting: Physical Therapy

## 2023-08-03 DIAGNOSIS — R278 Other lack of coordination: Secondary | ICD-10-CM | POA: Diagnosis not present

## 2023-08-03 DIAGNOSIS — M6281 Muscle weakness (generalized): Secondary | ICD-10-CM

## 2023-08-03 NOTE — Therapy (Addendum)
 OUTPATIENT PHYSICAL THERAPY FEMALE PELVIC TREATMENT   Patient Name: Wendy Jackson MRN: [id number] DOB:03/02/1945, 78 y.o., female Today's Date: 08/03/2023 Progress Note Reporting Period 04/20/23 to 08/03/23  See note below for Objective Data and Assessment of Progress/Goals.      END OF SESSION:  PT End of Session - 08/03/23 1104     Visit Number 10    Date for PT Re-Evaluation 10/05/23    Authorization Type [employer] medicare    Authorization - Visit Number 10    Authorization - Number of Visits 10    PT Start Time 1100    PT Stop Time 1140    PT Time Calculation (min) 40 min    Activity Tolerance Patient tolerated treatment well    Behavior During Therapy [facility] for tasks assessed/performed          Past Medical History:  Diagnosis Date   Cataract 03/2019   Glaucoma Approximately 2005   Horseshoe retinal tear of left eye 04/02/2021   Hyperlipidemia LDL goal <70 01/11/2021   Hypertension 2022   Long-term use of Plaquenil  04/02/2021   Lupus    new rheum questioning original dx. continuing Plaquenil  at lower dose for now.   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 bladder, acquired    Pseudophakia of both eyes 04/02/2021   Retinal detachment with multiple breaks, right eye 04/02/2021   Stroke (HCC) 04/2020   Thyroid  disease    Past Surgical History:  Procedure Laterality Date   ENDARTERECTOMY Right 05/02/2020   Procedure: RIGHT CAROTID ENDARTERECTOMY;  Surgeon: Oris Krystal FALCON, MD;  Location: [facility] OR;  Service: Vascular;  Laterality: Right;   EYE SURGERY  01/2020   LEFT HEART CATH AND CORONARY ANGIOGRAPHY N/A 02/03/2023   Procedure: LEFT HEART CATH AND CORONARY ANGIOGRAPHY;  Surgeon: Verlin Lonni BIRCH, MD;  Location: [facility];  Service: Cardiovascular;  Laterality: N/A;   SCLERAL BUCKLE Right 2004   TUBAL LIGATION  Feb 1982   Patient Active Problem List   Diagnosis Date Noted   Incontinence of feces with fecal urgency  05/05/2023   Gastroesophageal reflux disease with esophagitis without hemorrhage 05/05/2023   Bloating 05/05/2023   Other specified abnormal immunological findings in serum 04/03/2023   Elevated erythrocyte sedimentation rate 04/03/2023   High coronary artery calcium  score: 1525; 96th percentile 01/29/2023   SVT (supraventricular tachycardia) (HCC) 12/31/2022   Other fatigue 12/31/2022   Decreased exercise tolerance 12/31/2022   Polymyalgia rheumatica (HCC) 11/25/2022   Polyarthralgia 11/25/2022   Other secondary scoliosis, lumbar region 11/17/2022   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   Other female genital prolapse 10/29/2021   Long-term use of Plaquenil  04/02/2021   Primary open angle glaucoma (POAG) of both eyes, severe stage 04/02/2021   Primary hypertension 01/11/2021   Hyperlipidemia LDL goal <70 01/11/2021   Acquired thrombophilia (HCC) 12/11/2020   BMI less than 19,adult 12/11/2020   Hypothyroidism 12/11/2020   History of CVA (cerebrovascular accident) 05/02/2020   Lupus    Allergic rhinitis    CAD (coronary artery disease) 05/20/2018    PCP: Catherine Charlies LABOR, DO  REFERRING PROVIDER: Shila Gustav GAILS, MD   REFERRING DIAG:  K58.1 (ICD-10-CM) - Irritable bowel syndrome with constipation  R15.9,R15.2 (ICD-10-CM) - Incontinence of feces with fecal urgency    THERAPY DIAG:  Muscle weakness (generalized)  Other lack of coordination  Rationale for Evaluation and Treatment: Rehabilitation  ONSET DATE: 1/25  SUBJECTIVE:  SUBJECTIVE STATEMENT:  My stool is not as hard. My constipation is better.I have had less leakage in the last day or 2. I am having Type 4 lately. I have not been doing the abdominal massage.   PAIN:  Are you having pain? No  PRECAUTIONS: None  RED  FLAGS: None   WEIGHT BEARING RESTRICTIONS: No  FALLS:  Has patient fallen in last 6 months? No  OCCUPATION: retired  ACTIVITY LEVEL : tries to exercise, walks  PLOF: Independent  PATIENT GOALS: reduce the fecal leakage  PERTINENT HISTORY:  Glaucoma; Hypertension; Stroke, Thyroid ;  Sexual abuse: No  BOWEL MOVEMENT: Pain with bowel movement: No Type of bowel movement:Type (Bristol Stool Scale) type 4, Frequency 1-2 days, Strain some straining, and Splinting none Fully empty rectum: Yes:   Leakage: Yes: right after a bowel movement 07/08/23: leakage after having a bowel movement Pads: Yes: 1 per day and uses tissue paper so she is not changing the pad as often Fiber supplement/laxative prebiotic fiber  URINATION: Pain with urination: No Fully empty bladder: No sometimes she does not; pessary of the prolapse Stream: Strong Urgency: sometimes mostly at night Frequency: every 2-3 hours; night 2-3 times Leakage: Urge to void and Walking to the bathroom 06/29/23; no urinary leakage at this time 07/08/23: no urinary leakage 08/03/23; No urinary leakage Pads: Yes: 1 pad per night  INTERCOURSE: not active   PREGNANCY: Vaginal deliveries 2 Tearing Yes:   Episiotomy No  PROLAPSE: Patient is wearing a pessary    OBJECTIVE:  Note: Objective measures were completed at Evaluation unless otherwise noted.  DIAGNOSTIC FINDINGS:  None  PATIENT SURVEYS:  CRAIQ-7: 10 PFIQ-7: 6 07/08/23: CRAIQ-7: 14 PFIQ-7: 19 due to patient being frustrated and nervous about the leakage after she has a bowel movement 08/03/23: CRAIQ-7: 10 PFIQ-7: 6  COGNITION: Overall cognitive status: Within functional limits for tasks assessed     SENSATION: Light touch: Appears intact    GAIT: Assistive device utilized: None  POSTURE: rounded shoulders, forward head, increased thoracic kyphosis, and posterior pelvic tilt   LUMBARAROM/PROM:  A/PROM A/PROM  eval A/PROM  07/08/23 A/PROM  08/03/23   Flexion full full full  Extension Decreased by 25% Decreased by 25% Decreased by 25%  Right lateral flexion Decreased by 25% Decreased by 25% Decreased by 25%  Left lateral flexion Decreased by 25% Decreased by 25% Decreased by 25%  Right rotation Decreased by 25% Decreased by 25% Decreased by 25%  Left rotation Decreased by 25% Decreased by 25% Decreased by 25%   (Blank rows = not tested)  LOWER EXTREMITY ROM: bilateral hip ROM is full   LOWER EXTREMITY MMT: bilateral hip strength is 4/5 07/03/23: bilateral hip 4/5  PALPATION:    Abdominal: decreased lower abdominal contractions                External Perineal Exam: the anus is open                             Internal Pelvic Floor: the puborectalis has difficulty to move forward  Patient confirms identification and approves PT to assess internal pelvic floor and treatment Yes  PELVIC MMT:   MMT eval 06/22/23 07/08/23  Internal Anal Sphincter 2/5, 5 s 3/5 weak hug of therapist finger, 6 sec 3 x 3/5 weak hug of therapist finger,   External Anal Sphincter 2/5, 5 s 3/5 weak hug of therapist finger, 6 sec 3 x 3/5 weak  hug of therapist finger,   Puborectalis 2/5, 5 s 3/5 weak hug of therapist finger, 6 sec 3 x 3/5 weak hug of therapist finger,   (Blank rows = not tested)        TONE: Low tone   TODAY'S TREATMENT:  08/03/23 Manual: Soft tissue mobilization: Circular massage to the abdomen to review with patient on performing at home and she had not restriciotns Neuromuscular re-education: Pelvic floor contraction training: Sitting rectal contraction timing patient averaging 20 to 40 seconds 10 contractions Exercises: Stretches/mobility: Prone on elbows to increase thoracic extension Strengthening: Nustep level 5 for 8 minutes while assessing patient Prone lift opposite arm and leg 10 x each side Self-care: Educated patient to contract her rectum after a bowel movement to turn the muscles on for reduction of stool leakage.      07/27/23 Exercises: Stretches/mobility: Laying prone to elongate the anterior trunk Prone on elbows to increase thoracic extension Strengthening: ( engage core and pelvic floor)  Nustep level 5 for 8 minutes while assessing patient Quadruped lift arm 10 x each with core engaged Quadruped lift leg up 10 x each with core engaged Sit to stand holding 5# wts working on control and upright posture Side step holding 5# 10 x each Standing alternate shoulder and hip flexion holding 1 # 20 x Holding 2 # making A-Z     07/08/23 Manual: Internal pelvic floor techniques: No emotional/communication barriers or cognitive limitation. Patient is motivated to learn. Patient understands and agrees with treatment goals and plan. PT explains patient will be examined in standing, sitting, and lying down to see how their muscles and joints work. When they are ready, they will be asked to remove their underwear so PT can examine their perineum. The patient is also given the option of providing their own chaperone as one is not provided in our facility. The patient also has the right and is explained the right to defer or refuse any part of the evaluation or treatment including the internal exam. With the patient's consent, PT will use one gloved finger to gently assess the muscles of the pelvic floor, seeing how well it contracts and relaxes and if there is muscle symmetry. After, the patient will get dressed and PT and patient will discuss exam findings and plan of care. PT and patient discuss plan of care, schedule, attendance policy and HEP activities.  Therapist gloved finger in the rectum working on the anococcygeal ligament, puborectalis to elongate for contraction.  Neuromuscular re-education: Pelvic floor contraction training: Patient laying on right side-Therapist gloved finger in the rectum giving tactile cues for contractions holding 6 seconds 10 x, quick flicks 10 x, contract rectum with hip ER 10 x,  contract pelvic floor with shoulder horizontal abduction  Exercises: Strengthening: Nustep level 5 for 8 minutes while assessing patient Sitting pelvic floor contraction holding 10 seconds 10 x  Sit to stand holding 1 # in each hand 15 x with therapist giving tactile cues to flex at hips and retract her scapula      PATIENT EDUCATION:  06/29/23 Education details: Access Code: [email or web address] Person educated: Patient Education method: Explanation, Demonstration, Tactile cues, Verbal cues, and Handouts Education comprehension: verbalized understanding, returned demonstration, verbal cues required, tactile cues required, and needs further education  HOME EXERCISE PROGRAM: 06/29/23 Access Code: [email or web address] URL: [email or web address] Date: 06/29/2023 Prepared by: Channing Pereyra  Exercises - Sidelying Pelvic Floor Contraction with Self-Palpation  - 3 x daily - 7 x weekly - 1 sets - 10  reps - 5 sec hold - Supine Hip Adduction Isometric with Ball  - 1 x daily - 7 x weekly - 1 sets - 10 reps - 10 sec hold - Seated Hip Adduction Isometrics with Ball  - 1 x daily - 7 x weekly - 1 sets - 10 reps - 1 sec hold - Sit to Stand with Pelvic Floor Contraction  - 1 x daily - 7 x weekly - 1 sets - 10 reps - Supine Bridge with Mini Swiss Ball Between Knees  - 1 x daily - 3 x weekly - 1 sets - 10 reps - Hooklying Isometric Hip Flexion  - 1 x daily - 3 x weekly - 1 sets - 10 reps - Shoulder extension with resistance - Neutral  - 1 x daily - 3 x weekly - 2 sets - 10 reps - Standing Trunk Rotation with Resistance  - 1 x daily - 3 x weekly - 1 sets - 10 reps - Quadruped Alternating Arm Lift  - 1 x daily - 3 x weekly - 1 sets - 10 reps - Quadruped Leg Lifts  - 1 x daily - 3 x weekly - 1 sets - 10 reps  Patient Education - Abdominal Massage for Constipation - Abdominal Massage for Constipation   ASSESSMENT:  CLINICAL IMPRESSION: Patient is a 78 y.o. female who was seen today for physical therapy   treatment for IBS and fecal leakage.  She is leaking stool after she has had a bowel movement but this is better by 75%. . She will wait 2 minutes after bowel movement to see if this will assist with this. Patient is able to stand more erect now. She is not out of breath after using the nustep. She is not having urinary leakage.  Patient would benefit from skilled therapy to improve pelvic floor coordination and reduce fecal leakage.   OBJECTIVE IMPAIRMENTS: decreased endurance, decreased strength, and increased fascial restrictions.   ACTIVITY LIMITATIONS: continence and toileting  PARTICIPATION LIMITATIONS: community activity  PERSONAL FACTORS: Fitness, Time since onset of injury/illness/exacerbation, and 3+ comorbidities: Glaucoma; Hypertension; Stroke, Thyroid  are also affecting patient's functional outcome.   REHAB POTENTIAL: Good  CLINICAL DECISION MAKING: Evolving/moderate complexity  EVALUATION COMPLEXITY: Moderate   GOALS: Goals reviewed with patient? Yes  SHORT TERM GOALS: Target date: 05/18/23  Patient is independent with initial HEP.  Baseline: Goal status: Met 06/26/23  2.  Patient is able to contract her pelvic floor for 10 seconds.  Baseline:  Goal status: Met 08/03/23  3.  Patient understands how to perform abdominal massage to assist reduction of straining with bowel movements.  Baseline:  Goal status: Met 06/26/23   LONG TERM GOALS: Target date: 10/05/23  Patient independent with advanced HEP for pelvic floor and core strength.  Baseline:  Goal status: ongoing 07/2123  2.  Patient reports her stool leakage decreased >/= 80% due to the puborectalis coming forward.  Baseline: improved by 75% Goal status: ongoing 08/03/23  3.  Rectal strength is >/= 3/5 with holding for 40 seconds to improve continence.  Baseline: contact 6 seconds Goal status: Ongoing 08/03/23  4.  Patient is able to walk to the bathroom after the urge to have a bowel movement and have no stool  leakage.  Baseline:  Goal status: Met 07/06/23   PLAN:  PT FREQUENCY: 1-2x/week  PT DURATION: 12 weeks  PLANNED INTERVENTIONS: 97110-Therapeutic exercises, 97530- Therapeutic activity, 97112- Neuromuscular re-education, 97535- Self Care, 02859- Manual therapy, Patient/Family education, and Biofeedback  PLAN FOR NEXT SESSION:  core strength, hip strength, pelvic floor strength, see if she will leak stool more as the day progresses.  Channing Pereyra, PT 08/03/23 11:50 AM  PHYSICAL THERAPY DISCHARGE SUMMARY  Visits from Start of Care: 10  Current functional level related to goals / functional outcomes: See above. Patient has to stop physical therapy due her fractured patella and back pain. She will be attending physical therapy for her back. Patient agrees to stop pelvic floor physical therapy to focus on her back at this time.    Remaining deficits: See above.    Education / Equipment: HEP   Patient agrees to discharge. Patient goals were not met. Patient is being discharged due to the patient's request. Thank you for the referral.   Channing Pereyra, PT 09/07/23 12:42 PM

## 2023-08-05 ENCOUNTER — Encounter: Payer: Self-pay | Admitting: Pharmacist

## 2023-08-10 ENCOUNTER — Ambulatory Visit: Payer: Self-pay | Admitting: Physical Therapy

## 2023-08-13 ENCOUNTER — Encounter: Payer: Self-pay | Admitting: Family Medicine

## 2023-08-19 ENCOUNTER — Ambulatory Visit: Payer: Self-pay | Admitting: Physical Therapy

## 2023-08-26 ENCOUNTER — Encounter: Payer: Self-pay | Admitting: Orthopaedic Surgery

## 2023-08-26 ENCOUNTER — Ambulatory Visit
Admission: RE | Admit: 2023-08-26 | Discharge: 2023-08-26 | Disposition: A | Discharge status: Home / Self Care | Source: Ambulatory Visit | Attending: Orthopaedic Surgery | Admitting: Orthopaedic Surgery

## 2023-08-26 ENCOUNTER — Ambulatory Visit (INDEPENDENT_AMBULATORY_CARE_PROVIDER_SITE_OTHER): Payer: Self-pay

## 2023-08-26 ENCOUNTER — Ambulatory Visit: Admitting: Orthopaedic Surgery

## 2023-08-26 DIAGNOSIS — M25561 Pain in right knee: Secondary | ICD-10-CM | POA: Diagnosis not present

## 2023-08-26 DIAGNOSIS — S82121A Displaced fracture of lateral condyle of right tibia, initial encounter for closed fracture: Secondary | ICD-10-CM | POA: Diagnosis not present

## 2023-08-26 MED ORDER — HYDROCODONE-ACETAMINOPHEN 5-325 MG PO TABS
1.0000 | ORAL_TABLET | Freq: Four times a day (QID) | ORAL | 0 refills | Status: DC | PRN
Start: 2023-08-26 — End: 2023-09-22

## 2023-08-26 NOTE — Progress Notes (Signed)
 Office Visit Note   Patient: Wendy Jackson           Date of Birth: 10/07/1945           MRN: [id number] Visit Date: 08/26/2023              Requested by: Catherine Charlies LABOR, DO [address] [place],  [place] [address] PCP: Catherine Charlies LABOR, DO   Assessment & Plan: Visit Diagnoses:  1. Acute pain of right knee     Plan: History of Present Illness Wendy Jackson is a 78 year old female with polymyalgia rheumatica who presents with right knee pain following a fall. She is accompanied by her husband, Ubaldo.  She experienced significant right knee pain after a fall two weeks ago. The pain began a few days after tripping on chained outdoor chairs while stepping off a curb. Her knee became swollen and walking became difficult. She uses ice for swelling and takes Tylenol  for pain. She is unable to bear weight on the right leg, and the lower leg is swollen.  Swelling in her ankles and feet has been ongoing for over a year, more pronounced in the right leg since using an elastic knee brace. She previously wore compression socks but has not worn them recently. The swelling was associated with a skin condition treated with Tacrolimus  cream, which she has resumed due to recent redness.  She lives in a small apartment, which makes mobility challenging due to her knee pain.  Physical Exam EXTREMITIES: Right lower leg swollen. Right knee tender on palpation. No effusion in right knee joint.  Normal range of motion.  Results RADIOLOGY Right knee X-ray: No fracture or dislocation, evidence of arthritis  Assessment and Plan Right knee pain and swelling after fall Right knee pain and swelling post-fall with no fracture or dislocation on x-ray. Possible hairline fracture suspected. Severe pain limits mobility. - Order CT scan of the right knee to evaluate for fracture. - Prescribe hydrocodone  for pain management. - Advise use of knee brace for support if available.  Right lower leg dermatitis associated  with edema Right lower leg dermatitis likely due to chronic edema. No infection signs, but increased redness noted. - Continue use of topical tacrolimus  for dermatitis. - Advise wearing compression socks to manage edema.  Follow-Up Instructions: No follow-ups on file.   Orders:  Orders Placed This Encounter  Procedures   XR KNEE 3 VIEW RIGHT   CT KNEE RIGHT WO CONTRAST   Meds ordered this encounter  Medications   HYDROcodone -acetaminophen  (NORCO/VICODIN) 5-325 MG tablet    Sig: Take 1 tablet by mouth every 6 (six) hours as needed for moderate pain (pain score 4-6).    Dispense:  10 tablet    Refill:  0   Subjective: Chief Complaint  Patient presents with   Right Knee - Pain    HPI  Review of Systems  Constitutional: Negative.   HENT: Negative.    Eyes: Negative.   Respiratory: Negative.    Cardiovascular: Negative.   Endocrine: Negative.   Musculoskeletal: Negative.   Neurological: Negative.   Hematological: Negative.   Psychiatric/Behavioral: Negative.    All other systems reviewed and are negative.    Objective: Vital Signs: There were no vitals taken for this visit.  Physical Exam Vitals and nursing note reviewed.  Constitutional:      Appearance: She is well-developed.  HENT:     Head: Atraumatic.     Nose: Nose normal.  Eyes:  Extraocular Movements: Extraocular movements intact.  Cardiovascular:     Pulses: Normal pulses.  Pulmonary:     Effort: Pulmonary effort is normal.  Abdominal:     Palpations: Abdomen is soft.  Musculoskeletal:     Cervical back: Neck supple.  Skin:    General: Skin is warm.     Capillary Refill: Capillary refill takes less than 2 seconds.  Neurological:     Mental Status: She is alert. Mental status is at baseline.  Psychiatric:        Behavior: Behavior normal.        Thought Content: Thought content normal.        Judgment: Judgment normal.     Ortho Exam  Specialty Comments:  No specialty comments  available.  Imaging: CT KNEE RIGHT WO CONTRAST Result Date: 08/26/2023 CLINICAL DATA:  Anterior knee pain with limited weight-bearing after falling 1 week ago. Evaluate for fracture. EXAM: CT OF THE RIGHT KNEE WITHOUT CONTRAST TECHNIQUE: Multidetector CT imaging of the right knee was performed according to the standard protocol. Multiplanar CT image reconstructions were also generated. RADIATION DOSE REDUCTION: This exam was performed according to the departmental dose-optimization program which includes automated exposure control, adjustment of the mA and/or kV according to patient size and/or use of iterative reconstruction technique. COMPARISON:  Radiographs 08/26/2023. FINDINGS: Bones/Joint/Cartilage The bones are demineralized. There is an acute fracture of the lateral tibial plateau with up to 3 mm of depression of the articular surface anteriorly on the sagittal images. Probable medial extension of nondisplaced fractures in the sagittal plane into the intercondylar region without displacement of the tibial spines, best seen on the axial and sagittal images. The medial tibial plateau is intact. The distal femur, proximal fibula and patella are intact. Moderate-sized joint effusion (probable hemarthrosis) without definite lipohemarthrosis. Mild underlying tricompartmental degenerative changes for age. Ligaments Suboptimally assessed by CT.  Poorly visualized ACL. Muscles and Tendons No significant muscular findings. The quadriceps and patellar tendons are intact. Soft tissues Small to moderate Baker's cyst. No acute periarticular fluid collections are identified. Femoropopliteal atherosclerosis and mild anterolateral soft tissue swelling noted. No evidence of foreign body or soft tissue emphysema. IMPRESSION: 1. Acute fracture of the lateral tibial plateau as described with mild depression of the articular surface and subtle intercondylar extension. 2. No other acute osseous findings. 3. Moderate-sized  hemarthrosis and small to moderate Baker's cyst. Electronically Signed   By: Elsie Perone M.D.   On: 08/26/2023 16:42   XR KNEE 3 VIEW RIGHT Result Date: 08/26/2023 X-rays of the right knee show no acute or structural abnormalities.  Generalized osteopenia.    PMFS History: Patient Active Problem List   Diagnosis Date Noted   Incontinence of feces with fecal urgency 05/05/2023   Gastroesophageal reflux disease with esophagitis without hemorrhage 05/05/2023   Bloating 05/05/2023   Other specified abnormal immunological findings in serum 04/03/2023   Elevated erythrocyte sedimentation rate 04/03/2023   High coronary artery calcium  score: 1525; 96th percentile 01/29/2023   SVT (supraventricular tachycardia) (HCC) 12/31/2022   Other fatigue 12/31/2022   Decreased exercise tolerance 12/31/2022   Polymyalgia rheumatica (HCC) 11/25/2022   Polyarthralgia 11/25/2022   Other secondary scoliosis, lumbar region 11/17/2022   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   Other female genital prolapse 10/29/2021   Long-term use of Plaquenil  04/02/2021   Primary open angle glaucoma (POAG) of both eyes, severe stage 04/02/2021   Primary hypertension 01/11/2021   Hyperlipidemia LDL goal <70 01/11/2021   Acquired thrombophilia (HCC) 12/11/2020  BMI less than 19,adult 12/11/2020   Hypothyroidism 12/11/2020   History of CVA (cerebrovascular accident) 05/02/2020   Lupus    Allergic rhinitis    CAD (coronary artery disease) 05/20/2018   Past Medical History:  Diagnosis Date   Cataract 03/2019   Glaucoma Approximately 2005   Horseshoe retinal tear of left eye 04/02/2021   Hyperlipidemia LDL goal <70 01/11/2021   Hypertension 2022   Long-term use of Plaquenil  04/02/2021   Lupus    new rheum questioning original dx. continuing Plaquenil  at lower dose for now.   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 bladder, acquired    Pseudophakia of both eyes 04/02/2021    Retinal detachment with multiple breaks, right eye 04/02/2021   Stroke (HCC) 04/2020   Thyroid  disease     Family History  Problem Relation Age of Onset   ALS Mother    Early death Mother    Dementia Father    Hypertension Father    Macular degeneration Maternal Aunt    Alcohol abuse Paternal Uncle    Alcohol abuse Paternal Uncle     Past Surgical History:  Procedure Laterality Date   ENDARTERECTOMY Right 05/02/2020   Procedure: RIGHT CAROTID ENDARTERECTOMY;  Surgeon: Oris Krystal FALCON, MD;  Location: [facility] OR;  Service: Vascular;  Laterality: Right;   EYE SURGERY  01/2020   LEFT HEART CATH AND CORONARY ANGIOGRAPHY N/A 02/03/2023   Procedure: LEFT HEART CATH AND CORONARY ANGIOGRAPHY;  Surgeon: Verlin Lonni BIRCH, MD;  Location: [facility] INVASIVE CV LAB;  Service: Cardiovascular;  Laterality: N/A;   SCLERAL BUCKLE Right 2004   TUBAL LIGATION  Feb 1982   Social History   Occupational History   Occupation: retired   Occupation: retired  Tobacco Use   Smoking status: Never    Passive exposure: Never   Smokeless tobacco: Never  Vaping Use   Vaping status: Never Used  Substance and Sexual Activity   Alcohol use: Never   Drug use: Never   Sexual activity: Not Currently    Birth control/protection: Surgical

## 2023-08-27 ENCOUNTER — Other Ambulatory Visit

## 2023-09-02 ENCOUNTER — Ambulatory Visit: Admitting: Physical Therapy

## 2023-09-03 ENCOUNTER — Encounter: Payer: Self-pay | Admitting: Orthopaedic Surgery

## 2023-09-03 ENCOUNTER — Telehealth: Payer: Self-pay | Admitting: Orthopaedic Surgery

## 2023-09-03 NOTE — Telephone Encounter (Signed)
 Pt called asking for a call back for medical advice for back pains. Pt asked for Tinnie MATSU to give her a call at [phone].

## 2023-09-03 NOTE — Telephone Encounter (Signed)
 Tried to call patient. No answer. She sent a MyChart message about wanting to be worked in for lower back pain. I instructed her on voicemail that the soonest we can see her is with Ronal Dragon Persons at [facility] location tomorrow. Ask her to call back for scheduling.

## 2023-09-04 ENCOUNTER — Ambulatory Visit (INDEPENDENT_AMBULATORY_CARE_PROVIDER_SITE_OTHER)

## 2023-09-04 ENCOUNTER — Ambulatory Visit (HOSPITAL_BASED_OUTPATIENT_CLINIC_OR_DEPARTMENT_OTHER): Admitting: Physician Assistant

## 2023-09-04 ENCOUNTER — Other Ambulatory Visit (HOSPITAL_BASED_OUTPATIENT_CLINIC_OR_DEPARTMENT_OTHER): Payer: Self-pay | Admitting: Physician Assistant

## 2023-09-04 ENCOUNTER — Encounter (HOSPITAL_BASED_OUTPATIENT_CLINIC_OR_DEPARTMENT_OTHER): Payer: Self-pay | Admitting: Physician Assistant

## 2023-09-04 DIAGNOSIS — R29890 Loss of height: Secondary | ICD-10-CM | POA: Diagnosis not present

## 2023-09-04 DIAGNOSIS — M47816 Spondylosis without myelopathy or radiculopathy, lumbar region: Secondary | ICD-10-CM | POA: Diagnosis not present

## 2023-09-04 DIAGNOSIS — M438X6 Other specified deforming dorsopathies, lumbar region: Secondary | ICD-10-CM | POA: Diagnosis not present

## 2023-09-04 DIAGNOSIS — M545 Low back pain, unspecified: Secondary | ICD-10-CM

## 2023-09-04 DIAGNOSIS — M5136 Other intervertebral disc degeneration, lumbar region with discogenic back pain only: Secondary | ICD-10-CM | POA: Diagnosis not present

## 2023-09-04 NOTE — Telephone Encounter (Signed)
 thx

## 2023-09-04 NOTE — Progress Notes (Signed)
 Office Visit Note   Patient: Wendy Jackson           Date of Birth: 1946/01/01           MRN: [id number] Visit Date: 09/04/2023              Requested by: Catherine Charlies LABOR, DO [address] [place],  [place] [address] PCP: Catherine Charlies LABOR, DO   Assessment & Plan: Visit Diagnoses:  1. Acute bilateral low back pain without sciatica     Plan: Patient is a 78 year old woman who comes in with her husband today.  She has been followed by Dr. Jerri for a lateral nondisplaced tibial plateau fracture.  She has been having to use a walker and bear minimal weight.  She does have a history of significant degenerative scoliosis and degenerative changes for which she had seen Dr. Barbarann in the past.  Exam today not tender in her spine itself marked in the musculature.  I think this may be from using a walker which she has never used before.  Given it is more in the soft tissue I also asked that she discuss this with her primary care physician.  But the timing seems to coordinate using the walker which she does not use before and she has had to have a period of nonweightbearing.  Could we refer her back to her some physical therapy on her back.  Also could follow-up with Duwaine Pouch in a month if she still having problems.  Will place a referral.  She has no red flags no radicular findings just pain in the soft musculature.  I talked to her husband who was wondering if she could be on something stronger pain wise.  Unfortunately she has a significant problem with constipation I did not think this would be appropriate.  Follow-Up Instructions: No follow-ups on file.   Orders:  No orders of the defined types were placed in this encounter.  No orders of the defined types were placed in this encounter.     Procedures: No procedures performed   Clinical Data: No additional findings.   Subjective: Chief Complaint  Patient presents with   Lower Back - Pain    HPI patient is a pleasant 78 year old woman  who comes in today with a chief complaint of soreness and pain in the paravertebral musculature she has had no injury she was treated for a lateral tibial plateau fracture began using a walker a couple weeks ago  Review of Systems  All other systems reviewed and are negative.    Objective: Vital Signs: There were no vitals taken for this visit.  Physical Exam Constitutional:      Appearance: Normal appearance.  Pulmonary:     Effort: Pulmonary effort is normal.  Skin:    General: Skin is warm and dry.  Neurological:     General: No focal deficit present.     Mental Status: She is alert and oriented to person, place, and time.     Ortho Exam Examination she is nontender no step-offs throughout the thoracic and lower back no radicular findings her strength is at her baseline.  She does have some tenderness in the paravertebral muscles laterally.  She has good respiratory excursion is not short of breath she is neurologically intact Specialty Comments:  No specialty comments available.  Imaging: No results found.   PMFS History: Patient Active Problem List   Diagnosis Date Noted   Incontinence of feces with fecal urgency  05/05/2023   Gastroesophageal reflux disease with esophagitis without hemorrhage 05/05/2023   Bloating 05/05/2023   Other specified abnormal immunological findings in serum 04/03/2023   Elevated erythrocyte sedimentation rate 04/03/2023   High coronary artery calcium  score: 1525; 96th percentile 01/29/2023   SVT (supraventricular tachycardia) (HCC) 12/31/2022   Other fatigue 12/31/2022   Decreased exercise tolerance 12/31/2022   Polymyalgia rheumatica (HCC) 11/25/2022   Polyarthralgia 11/25/2022   Other secondary scoliosis, lumbar region 11/17/2022   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   Other female genital prolapse 10/29/2021   Long-term use of Plaquenil  04/02/2021   Primary open angle glaucoma (POAG) of both eyes, severe stage 04/02/2021   Primary  hypertension 01/11/2021   Hyperlipidemia LDL goal <70 01/11/2021   Acquired thrombophilia (HCC) 12/11/2020   BMI less than 19,adult 12/11/2020   Hypothyroidism 12/11/2020   History of CVA (cerebrovascular accident) 05/02/2020   Lupus    Allergic rhinitis    CAD (coronary artery disease) 05/20/2018   Past Medical History:  Diagnosis Date   Cataract 03/2019   Glaucoma Approximately 2005   Horseshoe retinal tear of left eye 04/02/2021   Hyperlipidemia LDL goal <70 01/11/2021   Hypertension 2022   Long-term use of Plaquenil  04/02/2021   Lupus    new rheum questioning original dx. continuing Plaquenil  at lower dose for now.   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 bladder, acquired    Pseudophakia of both eyes 04/02/2021   Retinal detachment with multiple breaks, right eye 04/02/2021   Stroke (HCC) 04/2020   Thyroid  disease     Family History  Problem Relation Age of Onset   ALS Mother    Early death Mother    Dementia Father    Hypertension Father    Macular degeneration Maternal Aunt    Alcohol abuse Paternal Uncle    Alcohol abuse Paternal Uncle     Past Surgical History:  Procedure Laterality Date   ENDARTERECTOMY Right 05/02/2020   Procedure: RIGHT CAROTID ENDARTERECTOMY;  Surgeon: Oris Krystal FALCON, MD;  Location: [facility] OR;  Service: Vascular;  Laterality: Right;   EYE SURGERY  01/2020   LEFT HEART CATH AND CORONARY ANGIOGRAPHY N/A 02/03/2023   Procedure: LEFT HEART CATH AND CORONARY ANGIOGRAPHY;  Surgeon: Verlin Lonni BIRCH, MD;  Location: [facility] INVASIVE CV LAB;  Service: Cardiovascular;  Laterality: N/A;   SCLERAL BUCKLE Right 2004   TUBAL LIGATION  Feb 1982   Social History   Occupational History   Occupation: retired   Occupation: retired  Tobacco Use   Smoking status: Never    Passive exposure: Never   Smokeless tobacco: Never  Vaping Use   Vaping status: Never Used  Substance and Sexual Activity   Alcohol use: Never    Drug use: Never   Sexual activity: Not Currently    Birth control/protection: Surgical

## 2023-09-07 ENCOUNTER — Telehealth: Payer: Self-pay | Admitting: Physical Therapy

## 2023-09-07 NOTE — Telephone Encounter (Signed)
 Spoke to patient about therapy. She agrees to be discharged from pelvic floor physical therapy and go to back physical therapy.  Wendy Jackson, PT @8 /25/25@ 12:39 PM

## 2023-09-09 ENCOUNTER — Encounter: Payer: Self-pay | Admitting: Physical Therapy

## 2023-09-10 ENCOUNTER — Other Ambulatory Visit: Payer: Self-pay

## 2023-09-10 ENCOUNTER — Ambulatory Visit: Attending: Gastroenterology | Admitting: Physical Therapy

## 2023-09-10 ENCOUNTER — Encounter: Payer: Self-pay | Admitting: Physical Therapy

## 2023-09-10 DIAGNOSIS — M6281 Muscle weakness (generalized): Secondary | ICD-10-CM | POA: Insufficient documentation

## 2023-09-10 DIAGNOSIS — R293 Abnormal posture: Secondary | ICD-10-CM | POA: Insufficient documentation

## 2023-09-10 DIAGNOSIS — M5459 Other low back pain: Secondary | ICD-10-CM | POA: Diagnosis not present

## 2023-09-10 DIAGNOSIS — M545 Low back pain, unspecified: Secondary | ICD-10-CM | POA: Diagnosis not present

## 2023-09-10 NOTE — Therapy (Signed)
 OUTPATIENT PHYSICAL THERAPY NEURO EVALUATION   Patient Name: Wendy Jackson MRN: [id number] DOB:1945-12-25, 78 y.o., female Today's Date: 09/10/2023   PCP: Catherine Charlies LABOR, DO REFERRING PROVIDER: Persons, Ronal Dragon, [place]   END OF SESSION:  PT End of Session - 09/10/23 1428     Visit Number 1    Number of Visits 13    Date for PT Re-Evaluation 10/23/23    Authorization Type [employer]BS medicare    Progress Note Due on Visit 10    PT Start Time 1150    PT Stop Time 1230    PT Time Calculation (min) 40 min    Activity Tolerance Patient tolerated treatment well    Behavior During Therapy [facility] for tasks assessed/performed          Past Medical History:  Diagnosis Date   Cataract 03/2019   Glaucoma Approximately 2005   Horseshoe retinal tear of left eye 04/02/2021   Hyperlipidemia LDL goal <70 01/11/2021   Hypertension 2022   Long-term use of Plaquenil  04/02/2021   Lupus    new rheum questioning original dx. continuing Plaquenil  at lower dose for now.   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 bladder, acquired    Pseudophakia of both eyes 04/02/2021   Retinal detachment with multiple breaks, right eye 04/02/2021   Stroke (HCC) 04/2020   Thyroid  disease    Past Surgical History:  Procedure Laterality Date   ENDARTERECTOMY Right 05/02/2020   Procedure: RIGHT CAROTID ENDARTERECTOMY;  Surgeon: Oris Krystal FALCON, MD;  Location: [facility] OR;  Service: Vascular;  Laterality: Right;   EYE SURGERY  01/2020   LEFT HEART CATH AND CORONARY ANGIOGRAPHY N/A 02/03/2023   Procedure: LEFT HEART CATH AND CORONARY ANGIOGRAPHY;  Surgeon: Verlin Lonni BIRCH, MD;  Location: [facility] INVASIVE CV LAB;  Service: Cardiovascular;  Laterality: N/A;   SCLERAL BUCKLE Right 2004   TUBAL LIGATION  Feb 1982   Patient Active Problem List   Diagnosis Date Noted   Incontinence of feces with fecal urgency 05/05/2023   Gastroesophageal reflux disease with esophagitis without hemorrhage  05/05/2023   Bloating 05/05/2023   Other specified abnormal immunological findings in serum 04/03/2023   Elevated erythrocyte sedimentation rate 04/03/2023   High coronary artery calcium  score: 1525; 96th percentile 01/29/2023   SVT (supraventricular tachycardia) (HCC) 12/31/2022   Other fatigue 12/31/2022   Decreased exercise tolerance 12/31/2022   Polymyalgia rheumatica (HCC) 11/25/2022   Polyarthralgia 11/25/2022   Other secondary scoliosis, lumbar region 11/17/2022   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   Other female genital prolapse 10/29/2021   Long-term use of Plaquenil  04/02/2021   Primary open angle glaucoma (POAG) of both eyes, severe stage 04/02/2021   Primary hypertension 01/11/2021   Hyperlipidemia LDL goal <70 01/11/2021   Acquired thrombophilia (HCC) 12/11/2020   BMI less than 19,adult 12/11/2020   Hypothyroidism 12/11/2020   History of CVA (cerebrovascular accident) 05/02/2020   Lupus    Allergic rhinitis    CAD (coronary artery disease) 05/20/2018    ONSET DATE: 09/04/2023 (MD referral)  REFERRING DIAG: M54.50 (ICD-10-CM) - Acute bilateral low back pain without sciatica   THERAPY DIAG:  Other low back pain  Abnormal posture  Muscle weakness (generalized)  Rationale for Evaluation and Treatment: Rehabilitation  SUBJECTIVE:  SUBJECTIVE STATEMENT: Had a split/crack in my R tibia/knee and that is better; from a fall 08/08/23 and then waited several weeks to see orthopedic MD.  Did a lot of sitting during that time, and the back was bothering me.  Did see the doctor at [facility] that recommended PT for my back. Pt accompanied by: self and significant other  PERTINENT HISTORY: followed by Dr. Jerri for a lateral nondisplaced tibial plateau fracture.  She has been having to use a walker  and bear minimal weight.  She does have a history of significant degenerative scoliosis and degenerative changes for which she had seen Dr. Barbarann in the past. Also hx of IBS, fecal leakage, has had pelvic floor therapy; polymalgia rheumatica   PAIN:  Are you having pain? Yes: NPRS scale: 6-8/10 Pain location: bilateral sides and low back Pain description: tight, achy Aggravating factors: sitting, lying back into supine Relieving factors: Unsure other than tylenol , ice  PRECAUTIONS: Fall and Other: hx of tibial plateau fx and per MD note above-minimal weightbearing RLE  RED FLAGS: None   WEIGHT BEARING RESTRICTIONS: Husband reports that MD visit stated NWB R knee, but she has to put a little weight on it with the walker  FALLS: Has patient fallen in last 6 months? Yes. Number of falls 1; 3 falls in past 13 months (all have been tripping)  LIVING ENVIRONMENT: Lives with: lives with their spouse Lives in: House/apartment [place] Stairs: No Has following equipment at home: Single point cane, Walker - 2 wheeled, and single leg scooter (unable to use)  PLOF: Independent  PATIENT GOALS: To get back less tight and painful  OBJECTIVE:  Note: Objective measures were completed at Evaluation unless otherwise noted.  DIAGNOSTIC FINDINGS: No acute findings per pt/husband report-curvature and severe arthritis  COGNITION: Overall cognitive status: Within functional limits for tasks assessed   SENSATION: Not tested  MUSCLE LENGTH: Hamstrings: Right NT deg; Left -30 deg   POSTURE: rounded shoulders, forward head, increased thoracic kyphosis, weight shift left, and L shoulder lower than R  LOWER EXTREMITY ROM:   WFL BLEs  Active  Right Eval Left Eval  Hip flexion    Hip extension    Hip abduction    Hip adduction    Hip internal rotation    Hip external rotation    Knee flexion    Knee extension    Ankle dorsiflexion    Ankle plantarflexion    Ankle inversion    Ankle  eversion     (Blank rows = not tested)  LOWER EXTREMITY MMT:  Grossly tested 4+/5, no c/o pain in low back or radiating into lower extremities  MMT Right Eval Left Eval  Hip flexion    Hip extension    Hip abduction    Hip adduction    Hip internal rotation    Hip external rotation    Knee flexion    Knee extension    Ankle dorsiflexion    Ankle plantarflexion    Ankle inversion    Ankle eversion    (Blank rows = not tested)  PALPATION:  Tender along lumbar paraspinals bilaterally  BED MOBILITY:  Findings: Tends to go long sit<>supine with pain upon supine; instructed in log roll technique:  supine>L side>sit with min assist and cues  TRANSFERS: Sit to stand: Modified independence  Assistive device utilized: BUE support     Stand to sit: Modified independence  Assistive device utilized: BUE support      GAIT: Findings: Gait  Characteristics: per MD note-minimal RLE weightbearing, step to pattern, step through pattern, and decreased stance time- Right, Distance walked: 20 ft, Assistive device utilized:Walker - 2 wheeled, Level of assistance: SBA, and Comments: antalgic pattern  FUNCTIONAL TESTS:  5 times sit to stand: 37.25 sec with BUE support  PATIENT SURVEYS:  Modified Oswestry:  MODIFIED OSWESTRY DISABILITY SCALE  Date: 09/10/2023 Score  Pain intensity 4 =  Pain medication provides me with little relief from pain.  2. Personal care (washing, dressing, etc.) 2 =  It is painful to take care of myself, and I am slow and careful.  3. Lifting 5 =  I cannot lift or carry anything at all.  4. Walking 4 = I can only walk with crutches or a cane.  5. Sitting 3 =  Pain prevents me from sitting more than  hour.  6. Standing 3 =  Pain prevents me from standing more than 1/2 hour.  7. Sleeping 1 = I can sleep well only by using pain medication.  8. Social Life 5 =  I have hardly any social life because of my pain.  9. Traveling 2 =  My pain restricts my travel over 2 hours.   10. Employment/ Homemaking 3 = Pain prevents me from doing anything but light duties.  Total 32/50= 64%   Interpretation of scores: Score Category Description  0-20% Minimal Disability The patient can cope with most living activities. Usually no treatment is indicated apart from advice on lifting, sitting and exercise  21-40% Moderate Disability The patient experiences more pain and difficulty with sitting, lifting and standing. Travel and social life are more difficult and they may be disabled from work. Personal care, sexual activity and sleeping are not grossly affected, and the patient can usually be managed by conservative means  41-60% Severe Disability Pain remains the main problem in this group, but activities of daily living are affected. These patients require a detailed investigation  61-80% Crippled Back pain impinges on all aspects of the patient's life. Positive intervention is required  81-100% Bed-bound  These patients are either bed-bound or exaggerating their symptoms  Bluford FORBES Zoe DELENA Karon DELENA, et al. Surgery versus conservative management of stable thoracolumbar fracture: the PRESTO feasibility RCT. [place]): [employer]; 2021 Nov. [facility] Technology Assessment, No. 25.62.) Appendix 3, Oswestry Disability Index category descriptors. Available from: FindJewelers.cz  Minimally Clinically Important Difference (MCID) = 12.8%                                                                                                                               TREATMENT DATE: 09/10/2023    PATIENT EDUCATION: Education details: Eval results, POC initiated HEP, log roll technique for bed mobility Person educated: Patient and Spouse Education method: Explanation, Demonstration, Verbal cues, and Handouts Education comprehension: verbalized understanding, returned demonstration, and needs further education  HOME EXERCISE PROGRAM: Access  Code: [email or web address] URL: [email or web address] Date: 09/10/2023 Prepared by: [facility] -  Outpatient  Rehab - [facility]  Exercises - Supine Pelvic Tilt  - 1-2 x daily - 7 x weekly - 1-2 sets - 5-10 reps - Lower Trunk Rotations  - 1 x daily - 7 x weekly - 2 sets - 10 reps  GOALS: Goals reviewed with patient? Yes  SHORT TERM GOALS: Target date: 09/25/2023  Pt will be independent with HEP for improved strength, pain, transfers. Baseline: Goal status: INITIAL  2.  Pt will improve 5x sit<>stand to less than or equal to 30 sec to demonstrate improved functional strength and transfer efficiency. Baseline: 37.25 sec Goal status: INITIAL  LONG TERM GOALS: Target date: 10/23/2023  Pt will be independent with progression of HEP for improved pain, strength, transfers. Baseline:  Goal status: INITIAL  2.  Pt will improve Modified Oswestry Score to less than or equal to 50%, to demo less pain interference in daily activities. Baseline: 64% Goal status: INITIAL  3.  Pt will improve 5x sit<>stand to less than or equal to 20 sec to demonstrate improved functional strength and transfer efficiency. Baseline:  Goal status: INITIAL  4.  Pt will rate pain in low back, decreased by at least 50%, for improved ADLs and gait activities. Baseline: 6-8/10 Goal status: INITIAL  ASSESSMENT:  CLINICAL IMPRESSION: Patient is a 78 y.o. female who was seen today for physical therapy evaluation and treatment for acute low back pain. She had a fall on 08/08/23, which resulted in R tibial plateau fracture.  Prior to seeing the MD on 8/13, she was less mobile and began to have back pain.  She denies any bowel/bladder changes, any radiating pain into extremities, and no sensation changes or weakness.  Of note, MD note states minimal weightbearing in RLE; she is using RW; tests were unremarkable (per husband report) for any fractures or changes in low back (other than arthritis).  She presents today  with abnormal posture, decreased functional strength, tenderness to palpation along lumbar paraspinal musculature, decreased hamstring flexibility, decreased independence with transfers and gait.  She will benefit from skilled PT to address the above stated deficits to decrease back pain and improve overall functional mobility.  OBJECTIVE IMPAIRMENTS: Abnormal gait, decreased balance, decreased mobility, difficulty walking, decreased strength, impaired flexibility, postural dysfunction, and pain.   ACTIVITY LIMITATIONS: sitting, standing, sleeping, transfers, bed mobility, and locomotion level  PARTICIPATION LIMITATIONS: community activity  PERSONAL FACTORS: 3+ comorbidities: See PMH above are also affecting patient's functional outcome.   REHAB POTENTIAL: Good  CLINICAL DECISION MAKING: Evolving/moderate complexity  EVALUATION COMPLEXITY: Moderate  PLAN:  PT FREQUENCY: 1-2x/week  PT DURATION: 6 weeks  PLANNED INTERVENTIONS: 97750- Physical Performance Testing, 97110-Therapeutic exercises, 97530- Therapeutic activity, 97112- Neuromuscular re-education, 97535- Self Care, [id number]- Manual therapy, [phone]- Gait training, Patient/Family education, and Balance training  PLAN FOR NEXT SESSION: Review HEP and progress for lumbar stretching, abdominal and lumbar strengthening; posture and positioning education; need to follow up with MD to clarify WB restrictions   Jona Erkkila W., PT 09/10/2023, 2:29 PM  [facility] at [facility] [address], [address] [place], [place] [address] Phone # [phone] Fax # [phone]

## 2023-09-11 ENCOUNTER — Telehealth: Payer: Self-pay | Admitting: Cardiology

## 2023-09-11 ENCOUNTER — Ambulatory Visit: Attending: Cardiology | Admitting: Pharmacist

## 2023-09-11 ENCOUNTER — Encounter: Payer: Self-pay | Admitting: Pharmacist

## 2023-09-11 VITALS — BP 172/76 | HR 65

## 2023-09-11 DIAGNOSIS — I10 Essential (primary) hypertension: Secondary | ICD-10-CM | POA: Diagnosis not present

## 2023-09-11 MED ORDER — VALSARTAN 160 MG PO TABS: 160.0000 mg | ORAL_TABLET | Freq: Two times a day (BID) | ORAL | 1 refills | Status: AC

## 2023-09-11 MED ORDER — VALSARTAN 160 MG PO TABS: 160.0000 mg | ORAL_TABLET | Freq: Two times a day (BID) | ORAL | 3 refills | Status: DC

## 2023-09-11 MED ORDER — VALSARTAN 160 MG PO TABS: 160.0000 mg | ORAL_TABLET | Freq: Every day | ORAL | 1 refills | Status: DC

## 2023-09-11 NOTE — Progress Notes (Signed)
 Patient ID: Wendy Jackson                 DOB: 18-Jan-1945                      MRN: [id number]     HPI: Wendy Jackson is a 78 y.o. female referred by Dr. Lavona to HTN clinic. PMH is significant for CAD, HTN, SVT, PMR, and history of CVA.  Patient presents today with husband for recheck. Valsartan  80mg  in the morning added to patient's regimen along with valsartan   160mg  in evening, verapamil  120mg  daily, and hydrochlorothiazide  25mg  in morning.  Blood pressure began improving until patient fell on cement and broke her kneecap. Currently being followed by [facility].  Given a walker to elevate her leg, but this ended up giving her back pain.  Now uses a regular walker.  [facility] prescribed oxycodone  but she has not taken. Has been trying to manage pain with Tylenol  but has been ineffective.  Has a long history of white coat HTN.   Current HTN meds:  Valsartan  160mg  daily Hydrochlorothiazide  25mg  daily Verapamil  120mg  daily   Previously tried: Amlodipine  (edema) BP goal: <130/80  Wt Readings from Last 3 Encounters:  07/16/23 89 lb (40.4 kg)  07/01/23 89 lb 9.6 oz (40.6 kg)  05/12/23 88 lb 12.8 oz (40.3 kg)   BP Readings from Last 3 Encounters:  07/16/23 114/72  07/01/23 (!) 190/70  06/23/23 124/84   Pulse Readings from Last 3 Encounters:  07/16/23 66  07/01/23 60  06/23/23 69    Renal function: CrCl cannot be calculated (Patient's most recent lab result is older than the maximum 21 days allowed.).  Past Medical History:  Diagnosis Date   Cataract 03/2019   Glaucoma Approximately 2005   Horseshoe retinal tear of left eye 04/02/2021   Hyperlipidemia LDL goal <70 01/11/2021   Hypertension 2022   Long-term use of Plaquenil  04/02/2021   Lupus    new rheum questioning original dx. continuing Plaquenil  at lower dose for now.   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 bladder, acquired    Pseudophakia of both eyes 04/02/2021    Retinal detachment with multiple breaks, right eye 04/02/2021   Stroke (HCC) 04/2020   Thyroid  disease     Current Outpatient Medications on File Prior to Visit  Medication Sig Dispense Refill   ARMOUR THYROID  30 MG tablet Take 1 tablet (30 mg total) by mouth daily. 90 tablet 4   aspirin  EC 81 MG tablet Take 1 tablet (81 mg total) by mouth daily. Swallow whole. 30 tablet 11   cetirizine (ZYRTEC) 10 MG tablet Take 10 mg by mouth See admin instructions. Every 36 hours     estradiol  (ESTRACE ) 0.1 MG/GM vaginal cream Use 1/2 gram three times per week at night. 42.5 g 0   furosemide  (LASIX ) 40 MG tablet Take 40 mg by mouth daily as needed for fluid or edema.     hydrochlorothiazide  (HYDRODIURIL ) 25 MG tablet Take 1 tablet (25 mg total) by mouth daily. 90 tablet 3   hydroxychloroquine  (PLAQUENIL ) 200 MG tablet Take 200 mg by mouth every other day.     latanoprost (XALATAN) 0.005 % ophthalmic solution Place 1 drop into the left eye at bedtime.     Misc Natural Products (AIRBORNE ELDERBERRY) CHEW Chew 1 tablet by mouth in the morning and at bedtime.     pantoprazole  (PROTONIX ) 40 MG  tablet Take 1 tablet (40 mg total) by mouth daily. 90 tablet 3   Probiotic Product (UP4 PROBIOTICS WOMENS PO) Take 1 capsule by mouth daily.     Resveratrol 50 MG CAPS Take 50 mg by mouth daily.     rosuvastatin  (CRESTOR ) 40 MG tablet Take 1 tablet (40 mg total) by mouth daily. 90 tablet 3   SIMBRINZA 1-0.2 % SUSP Place 1 drop into the left eye 3 (three) times daily.     Sodium Chloride -Xylitol (XLEAR SINUS CARE SPRAY NA) Place 4 sprays into the nose daily as needed (Moisturing).     Ubiquinol 100 MG CAPS Take 100 mg by mouth.     valsartan  (DIOVAN ) 160 MG tablet TAKE 1 TABLET BY MOUTH EVERY EVENING 30 tablet 2   verapamil  (CALAN -SR) 120 MG CR tablet Take 1 tablet (120 mg total) by mouth at bedtime. 90 tablet 1   No current facility-administered medications on file prior to visit.    Allergies  Allergen Reactions    Amlodipine  Other (See Comments)    Bilateral lower extremity edema   Augmentin [Amoxicillin-Pot Clavulanate] Other (See Comments)    unk   Tramadol     Nausea    Flexeril [Cyclobenzaprine] Palpitations     Assessment/Plan:  1. Hypertension - HYPERTENSION CONTROL Vitals:   09/11/23 1042 09/11/23 1612  BP: (!) 180/68 (!) 172/76    The patient's blood pressure is elevated above target today.  In order to address the patient's elevated BP: The blood pressure is usually elevated in clinic.  Blood pressures monitored at home have been optimal.; A current anti-hypertensive medication was adjusted today.     Patient BP very elevated in room at 180/68 and rechecked at 172/76. This is typical for her in office visits. Likely pain and stress are contributing. Will increase valsartan  to 160mg  BID and contact patient over phone for readings. Patient agreeable to plan.  Increase to valsartan  160mg  BID Continue hydrochlorothiazide  25mg  in morning Continue verapamil  120mg  in evening Recheck over phone in 4 weeks  Medford Bolk, PharmD, [place], CDCES, CPP [facility] [address] [place], [place] [address] Phone: [phone]; Fax: [phone] 08/05/2023 9:29 AM

## 2023-09-11 NOTE — Telephone Encounter (Signed)
 Pt c/o medication issue:  1. Name of Medication: valsartan  (DIOVAN ) 160 MG tablet   2. How are you currently taking this medication (dosage and times per day)? As written   3. Are you having a reaction (difficulty breathing--STAT)? No   4. What is your medication issue? James with pharmacy called in asking for clarification on instructions for this medication. He stated to please send it back over or call him back.

## 2023-09-11 NOTE — Patient Instructions (Addendum)
 Good seeing you two again  I am sorry about the knee  We will increase your valsartan  to 160mg  twice a day  Continue verapamil  120mg  once a day and hydrochlorothiazide  25mg  once a day  Continue to monitor your blood pressure at home  I have placed a lab order for you to have drawn the week of 9/8  Please let us  know if you have any questions  Medford Bolk, PharmD, BCACP, CDCES, CPP [facility] [address] [place], [place] [address] Phone: [phone]; Fax: [phone] 09/11/2023 10:50 AM

## 2023-09-18 ENCOUNTER — Encounter: Payer: Self-pay | Admitting: Physical Therapy

## 2023-09-18 ENCOUNTER — Ambulatory Visit: Attending: Physician Assistant | Admitting: Physical Therapy

## 2023-09-18 ENCOUNTER — Encounter: Payer: Self-pay | Admitting: Obstetrics and Gynecology

## 2023-09-18 DIAGNOSIS — M6281 Muscle weakness (generalized): Secondary | ICD-10-CM | POA: Insufficient documentation

## 2023-09-18 DIAGNOSIS — R2681 Unsteadiness on feet: Secondary | ICD-10-CM | POA: Diagnosis not present

## 2023-09-18 DIAGNOSIS — M25561 Pain in right knee: Secondary | ICD-10-CM | POA: Insufficient documentation

## 2023-09-18 DIAGNOSIS — R262 Difficulty in walking, not elsewhere classified: Secondary | ICD-10-CM | POA: Diagnosis not present

## 2023-09-18 DIAGNOSIS — R293 Abnormal posture: Secondary | ICD-10-CM | POA: Diagnosis not present

## 2023-09-18 DIAGNOSIS — M5459 Other low back pain: Secondary | ICD-10-CM | POA: Diagnosis not present

## 2023-09-18 NOTE — Therapy (Signed)
 OUTPATIENT PHYSICAL THERAPY NEURO TREATMENT NOTE   Patient Name: Wendy Jackson MRN: [id number] DOB:[birth year], 78 y.o., female Today's Date: 09/18/2023   PCP: Catherine Charlies LABOR, DO REFERRING PROVIDER: Persons, Ronal Dragon, [place]   END OF SESSION:  PT End of Session - 09/18/23 1105     Visit Number 2    Number of Visits 13    Date for PT Re-Evaluation 10/23/23    Authorization Type BCBS medicare    Progress Note Due on Visit 10    PT Start Time 1106    PT Stop Time 1144    PT Time Calculation (min) 38 min    Activity Tolerance Patient tolerated treatment well    Behavior During Therapy [facility] for tasks assessed/performed           Past Medical History:  Diagnosis Date   Cataract 03/2019   Glaucoma Approximately 2005   Horseshoe retinal tear of left eye 04/02/2021   Hyperlipidemia LDL goal <70 01/11/2021   Hypertension 2022   Long-term use of Plaquenil  04/02/2021   Lupus    new rheum questioning original dx. continuing Plaquenil  at lower dose for now.   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 bladder, acquired    Pseudophakia of both eyes 04/02/2021   Retinal detachment with multiple breaks, right eye 04/02/2021   Stroke (HCC) 04/2020   Thyroid  disease    Past Surgical History:  Procedure Laterality Date   ENDARTERECTOMY Right 05/02/2020   Procedure: RIGHT CAROTID ENDARTERECTOMY;  Surgeon: Oris Krystal FALCON, MD;  Location: [facility] OR;  Service: Vascular;  Laterality: Right;   EYE SURGERY  01/2020   LEFT HEART CATH AND CORONARY ANGIOGRAPHY N/A 02/03/2023   Procedure: LEFT HEART CATH AND CORONARY ANGIOGRAPHY;  Surgeon: Verlin Lonni BIRCH, MD;  Location: [facility] INVASIVE CV LAB;  Service: Cardiovascular;  Laterality: N/A;   SCLERAL BUCKLE Right 2004   TUBAL LIGATION  Feb 1982   Patient Active Problem List   Diagnosis Date Noted   Incontinence of feces with fecal urgency 05/05/2023   Gastroesophageal reflux disease with esophagitis without  hemorrhage 05/05/2023   Bloating 05/05/2023   Other specified abnormal immunological findings in serum 04/03/2023   Elevated erythrocyte sedimentation rate 04/03/2023   High coronary artery calcium  score: 1525; 96th percentile 01/29/2023   SVT (supraventricular tachycardia) (HCC) 12/31/2022   Other fatigue 12/31/2022   Decreased exercise tolerance 12/31/2022   Polymyalgia rheumatica (HCC) 11/25/2022   Polyarthralgia 11/25/2022   Other secondary scoliosis, lumbar region 11/17/2022   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   Other female genital prolapse 10/29/2021   Long-term use of Plaquenil  04/02/2021   Primary open angle glaucoma (POAG) of both eyes, severe stage 04/02/2021   Primary hypertension 01/11/2021   Hyperlipidemia LDL goal <70 01/11/2021   Acquired thrombophilia (HCC) 12/11/2020   BMI less than 19,adult 12/11/2020   Hypothyroidism 12/11/2020   History of CVA (cerebrovascular accident) 05/02/2020   Lupus    Allergic rhinitis    CAD (coronary artery disease) 05/20/2018    ONSET DATE: 09/04/2023 (MD referral)  REFERRING DIAG: M54.50 (ICD-10-CM) - Acute bilateral low back pain without sciatica   THERAPY DIAG:  Other low back pain  Abnormal posture  Muscle weakness (generalized)  Rationale for Evaluation and Treatment: Rehabilitation  SUBJECTIVE:  SUBJECTIVE STATEMENT: Feel like my back is a little better.  Like having something to do for my back.  I haven't tried the log roll-scared to bring on the vertigo. Pt accompanied by: self and significant other  PERTINENT HISTORY: followed by Dr. Jerri for a lateral nondisplaced tibial plateau fracture.  She has been having to use a walker and bear minimal weight.  She does have a history of significant degenerative scoliosis and degenerative changes for  which she had seen Dr. Barbarann in the past. Also hx of IBS, fecal leakage, has had pelvic floor therapy; polymalgia rheumatica   PAIN:  Are you having pain? Yes: NPRS scale: maybe a little better 6-8/10 Pain location: bilateral sides and low back Pain description: tight, achy Aggravating factors: sitting, lying back into supine Relieving factors: Unsure other than tylenol , ice  PRECAUTIONS: Fall and Other: hx of tibial plateau fx and per MD note above-minimal weightbearing RLE  RED FLAGS: None   WEIGHT BEARING RESTRICTIONS: Husband reports that MD visit stated NWB R knee, but she has to put a little weight on it with the walker  FALLS: Has patient fallen in last 6 months? Yes. Number of falls 1; 3 falls in past 13 months (all have been tripping)  LIVING ENVIRONMENT: Lives with: lives with their spouse Lives in: House/apartment [place] Stairs: No Has following equipment at home: Single point cane, Environmental consultant - 2 wheeled, and single leg scooter (unable to use)  PLOF: Independent  PATIENT GOALS: To get back less tight and painful  OBJECTIVE:   TODAY'S TREATMENT: 09/18/2023 Activity Comments  Seated pelvic tilt-ant/posterior With cues for position, then cues for abdominal activation  Seated abdominal activation with UE alt lifts x 5, then BUE lifts   Forward lean at therapy ball, 5 reps, 5 sec hold Forward lean at therapy ball with trunk rotation, 3 reps each side Pt reports good stretch; also tried with chair in front  Seated postural strengthening:  scapular squeezes x 10 Horizontal shoulder adduct x 8 reps (no resistance)   Good form, no c/o pain  Some pain in R thoracic area  Standing activities at sink to simulate hygiene tasks Pt reports she bends over to do these activities; responds well to cues for stagger stance position and rocking through hips, still with attention to avoid WB through RLE      Reports less pain at end of session.    Access Code: [email or web address] URL:  [email or web address] Date: 09/18/2023 Prepared by: [facility] - Outpatient  Rehab - [facility]  Exercises - Supine Pelvic Tilt  - 1-2 x daily - 7 x weekly - 1-2 sets - 5-10 reps - Lower Trunk Rotations  - 1 x daily - 7 x weekly - 2 sets - 10 reps - Seated Flexion Stretch with Swiss Ball  - 1 x daily - 7 x weekly - 1 sets - 3-5 reps - Seated Thoracic Flexion and Rotation with Swiss Ball  - 1 x daily - 7 x weekly - 1 sets - 3-5 reps - 10-15 sec hold - Seated Pelvic Tilt  - 1 x daily - 7 x weekly - 2 sets - 5-10 reps - Seated Scapular Retraction  - 1 x daily - 7 x weekly - 2 sets - 5-10 reps  PATIENT EDUCATION: Education details: Additions to HEP; postural education and positioning with sitting and abdominal activation/with standing and stagger stance position (RLE in front) for weightshift through hips; answered pt/husband's questions on posture/positioning/abdominal activation Person educated: Patient  and Spouse Education method: Explanation, Demonstration, and Handouts Education comprehension: verbalized understanding, returned demonstration, and needs further education   Note: Objective measures were completed at Evaluation unless otherwise noted.  DIAGNOSTIC FINDINGS: No acute findings per pt/husband report-curvature and severe arthritis  COGNITION: Overall cognitive status: Within functional limits for tasks assessed   SENSATION: Not tested  MUSCLE LENGTH: Hamstrings: Right NT deg; Left -30 deg   POSTURE: rounded shoulders, forward head, increased thoracic kyphosis, weight shift left, and L shoulder lower than R  LOWER EXTREMITY ROM:   WFL BLEs  Active  Right Eval Left Eval  Hip flexion    Hip extension    Hip abduction    Hip adduction    Hip internal rotation    Hip external rotation    Knee flexion    Knee extension    Ankle dorsiflexion    Ankle plantarflexion    Ankle inversion    Ankle eversion     (Blank rows = not tested)  LOWER  EXTREMITY MMT:  Grossly tested 4+/5, no c/o pain in low back or radiating into lower extremities  MMT Right Eval Left Eval  Hip flexion    Hip extension    Hip abduction    Hip adduction    Hip internal rotation    Hip external rotation    Knee flexion    Knee extension    Ankle dorsiflexion    Ankle plantarflexion    Ankle inversion    Ankle eversion    (Blank rows = not tested)  PALPATION:  Tender along lumbar paraspinals bilaterally  BED MOBILITY:  Findings: Tends to go long sit<>supine with pain upon supine; instructed in log roll technique:  supine>L side>sit with min assist and cues  TRANSFERS: Sit to stand: Modified independence  Assistive device utilized: BUE support     Stand to sit: Modified independence  Assistive device utilized: BUE support      GAIT: Findings: Gait Characteristics: per MD note-minimal RLE weightbearing, step to pattern, step through pattern, and decreased stance time- Right, Distance walked: 20 ft, Assistive device utilized:Walker - 2 wheeled, Level of assistance: SBA, and Comments: antalgic pattern  FUNCTIONAL TESTS:  5 times sit to stand: 37.25 sec with BUE support  PATIENT SURVEYS:  Modified Oswestry:  MODIFIED OSWESTRY DISABILITY SCALE  Date: 09/10/2023 Score  Pain intensity 4 =  Pain medication provides me with little relief from pain.  2. Personal care (washing, dressing, etc.) 2 =  It is painful to take care of myself, and I am slow and careful.  3. Lifting 5 =  I cannot lift or carry anything at all.  4. Walking 4 = I can only walk with crutches or a cane.  5. Sitting 3 =  Pain prevents me from sitting more than  hour.  6. Standing 3 =  Pain prevents me from standing more than 1/2 hour.  7. Sleeping 1 = I can sleep well only by using pain medication.  8. Social Life 5 =  I have hardly any social life because of my pain.  9. Traveling 2 =  My pain restricts my travel over 2 hours.  10. Employment/ Homemaking 3 = Pain prevents me  from doing anything but light duties.  Total 32/50= 64%   Interpretation of scores: Score Category Description  0-20% Minimal Disability The patient can cope with most living activities. Usually no treatment is indicated apart from advice on lifting, sitting and exercise  21-40% Moderate Disability The  patient experiences more pain and difficulty with sitting, lifting and standing. Travel and social life are more difficult and they may be disabled from work. Personal care, sexual activity and sleeping are not grossly affected, and the patient can usually be managed by conservative means  41-60% Severe Disability Pain remains the main problem in this group, but activities of daily living are affected. These patients require a detailed investigation  61-80% Crippled Back pain impinges on all aspects of the patient's life. Positive intervention is required  81-100% Bed-bound  These patients are either bed-bound or exaggerating their symptoms  Bluford FORBES Zoe DELENA Karon DELENA, et al. Surgery versus conservative management of stable thoracolumbar fracture: the PRESTO feasibility RCT. [place]): [employer]; 2021 Nov. [facility] Technology Assessment, No. 25.62.) Appendix 3, Oswestry Disability Index category descriptors. Available from: FindJewelers.cz  Minimally Clinically Important Difference (MCID) = 12.8%                                                                                                                               TREATMENT DATE: 09/10/2023    PATIENT EDUCATION: Education details: Eval results, POC initiated HEP, log roll technique for bed mobility Person educated: Patient and Spouse Education method: Explanation, Demonstration, Verbal cues, and Handouts Education comprehension: verbalized understanding, returned demonstration, and needs further education  HOME EXERCISE PROGRAM: Access Code: [email or web address] URL:  [email or web address] Date: 09/10/2023 Prepared by: [facility] - Outpatient  Rehab - [facility]  Exercises - Supine Pelvic Tilt  - 1-2 x daily - 7 x weekly - 1-2 sets - 5-10 reps - Lower Trunk Rotations  - 1 x daily - 7 x weekly - 2 sets - 10 reps  GOALS: Goals reviewed with patient? Yes  SHORT TERM GOALS: Target date: 09/25/2023  Pt will be independent with HEP for improved strength, pain, transfers. Baseline: Goal status: INITIAL  2.  Pt will improve 5x sit<>stand to less than or equal to 30 sec to demonstrate improved functional strength and transfer efficiency. Baseline: 37.25 sec Goal status: INITIAL  LONG TERM GOALS: Target date: 10/23/2023  Pt will be independent with progression of HEP for improved pain, strength, transfers. Baseline:  Goal status: INITIAL  2.  Pt will improve Modified Oswestry Score to less than or equal to 50%, to demo less pain interference in daily activities. Baseline: 64% Goal status: INITIAL  3.  Pt will improve 5x sit<>stand to less than or equal to 20 sec to demonstrate improved functional strength and transfer efficiency. Baseline:  Goal status: INITIAL  4.  Pt will rate pain in low back, decreased by at least 50%, for improved ADLs and gait activities. Baseline: 6-8/10 Goal status: INITIAL  ASSESSMENT:  CLINICAL IMPRESSION: Pt presents today and reports slightly less pain than last visit.  Skilled PT session focused on stretching for thoracic/mid back area, postural strenghtening/core stability, and ways to lessen pain with standing activities. Pt needs initial cues for technique, and then  performs exercises well.  She reports less pain at en of session.  Pt will continue to benefit from skilled PT towards goals for improved functional mobility, posture, and decreased pain.    From eval:  Patient is a 78 y.o. female who was seen today for physical therapy evaluation and treatment for acute low back pain. She had a fall  on 08/08/23, which resulted in R tibial plateau fracture.  Prior to seeing the MD on 8/13, she was less mobile and began to have back pain.  She denies any bowel/bladder changes, any radiating pain into extremities, and no sensation changes or weakness.  Of note, MD note states minimal weightbearing in RLE; she is using RW; tests were unremarkable (per husband report) for any fractures or changes in low back (other than arthritis).  She presents today with abnormal posture, decreased functional strength, tenderness to palpation along lumbar paraspinal musculature, decreased hamstring flexibility, decreased independence with transfers and gait.  She will benefit from skilled PT to address the above stated deficits to decrease back pain and improve overall functional mobility.  OBJECTIVE IMPAIRMENTS: Abnormal gait, decreased balance, decreased mobility, difficulty walking, decreased strength, impaired flexibility, postural dysfunction, and pain.   ACTIVITY LIMITATIONS: sitting, standing, sleeping, transfers, bed mobility, and locomotion level  PARTICIPATION LIMITATIONS: community activity  PERSONAL FACTORS: 3+ comorbidities: See PMH above are also affecting patient's functional outcome.   REHAB POTENTIAL: Good  CLINICAL DECISION MAKING: Evolving/moderate complexity  EVALUATION COMPLEXITY: Moderate  PLAN:  PT FREQUENCY: 1-2x/week  PT DURATION: 6 weeks  PLANNED INTERVENTIONS: 97750- Physical Performance Testing, 97110-Therapeutic exercises, 97530- Therapeutic activity, 97112- Neuromuscular re-education, 97535- Self Care, 02859- Manual therapy, [phone]- Gait training, Patient/Family education, and Balance training  PLAN FOR NEXT SESSION: Review HEP updates and progress for lumbar stretching, abdominal and lumbar strengthening; posture and positioning education; need to follow up with MD to clarify WB restrictions (pt to see MD 09/25/2023)   STARLET GREIG ORN., PT 09/18/2023, 11:52 AM  [facility]  Outpatient Rehab at [facility] [address], [address] [place], [place] [address] Phone # [phone] Fax # [phone]

## 2023-09-18 NOTE — Progress Notes (Unsigned)
 GYNECOLOGY  VISIT   HPI: 78 y.o.   Married  Caucasian female   G2P0002 with No LMP recorded. Patient is postmenopausal.   here for: 3 month pessary check       No bleeding, spotting or pain.   Not using the estrogen cream as much.     Fell and cracked her patella.  Using a walker.    GYNECOLOGIC HISTORY: No LMP recorded. Patient is postmenopausal. Contraception:  PMP, tubal  Menopausal hormone therapy:  Estrace   Last 2 paps:  10/09/22 ASCUS, HR HPV neg, 11/11/21 ASCUS, HR HPV neg History of abnormal Pap or positive HPV:  yes Mammogram:  11/13/22 Breast Density Cat C, BIRADS Cat 1 neg         OB History     Gravida  2   Para      Term      Preterm      AB  0   Living  2      SAB  0   IAB      Ectopic  0   Multiple      Live Births                 Patient Active Problem List   Diagnosis Date Noted   Incontinence of feces with fecal urgency 05/05/2023   Gastroesophageal reflux disease with esophagitis without hemorrhage 05/05/2023   Bloating 05/05/2023   Other specified abnormal immunological findings in serum 04/03/2023   Elevated erythrocyte sedimentation rate 04/03/2023   High coronary artery calcium  score: 1525; 96th percentile 01/29/2023   SVT (supraventricular tachycardia) (HCC) 12/31/2022   Other fatigue 12/31/2022   Decreased exercise tolerance 12/31/2022   Polymyalgia rheumatica (HCC) 11/25/2022   Polyarthralgia 11/25/2022   Other secondary scoliosis, lumbar region 11/17/2022   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   Other female genital prolapse 10/29/2021   Long-term use of Plaquenil  04/02/2021   Primary open angle glaucoma (POAG) of both eyes, severe stage 04/02/2021   Primary hypertension 01/11/2021   Hyperlipidemia LDL goal <70 01/11/2021   Acquired thrombophilia (HCC) 12/11/2020   BMI less than 19,adult 12/11/2020   Hypothyroidism 12/11/2020   History of CVA (cerebrovascular accident) 05/02/2020   Lupus    Allergic rhinitis    CAD  (coronary artery disease) 05/20/2018    Past Medical History:  Diagnosis Date   Cataract 03/2019   Glaucoma Approximately 2005   [place] retinal tear of left eye 04/02/2021   Hyperlipidemia LDL goal <70 01/11/2021   Hypertension 2022   Long-term use of Plaquenil  04/02/2021   Lupus    new rheum questioning original dx. continuing Plaquenil  at lower dose for now.   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 bladder, acquired    Pseudophakia of both eyes 04/02/2021   Retinal detachment with multiple breaks, right eye 04/02/2021   Stroke (HCC) 04/2020   Thyroid  disease     Past Surgical History:  Procedure Laterality Date   ENDARTERECTOMY Right 05/02/2020   Procedure: RIGHT CAROTID ENDARTERECTOMY;  Surgeon: Oris Krystal FALCON, MD;  Location: [facility] OR;  Service: Vascular;  Laterality: Right;   EYE SURGERY  01/2020   LEFT HEART CATH AND CORONARY ANGIOGRAPHY N/A 02/03/2023   Procedure: LEFT HEART CATH AND CORONARY ANGIOGRAPHY;  Surgeon: Verlin Lonni BIRCH, MD;  Location: [facility] INVASIVE CV LAB;  Service: Cardiovascular;  Laterality: N/A;   SCLERAL BUCKLE Right 2004   TUBAL LIGATION  Feb 1982  Current Outpatient Medications  Medication Sig Dispense Refill   ARMOUR THYROID  30 MG tablet Take 1 tablet (30 mg total) by mouth daily. 90 tablet 4   aspirin  EC 81 MG tablet Take 1 tablet (81 mg total) by mouth daily. Swallow whole. 30 tablet 11   cetirizine (ZYRTEC) 10 MG tablet Take 10 mg by mouth See admin instructions. Every 36 hours     estradiol  (ESTRACE ) 0.1 MG/GM vaginal cream Use 1/2 gram three times per week at night. 42.5 g 0   furosemide  (LASIX ) 40 MG tablet Take 40 mg by mouth daily as needed for fluid or edema.     hydrochlorothiazide  (HYDRODIURIL ) 25 MG tablet Take 1 tablet (25 mg total) by mouth daily. 90 tablet 3   hydroxychloroquine  (PLAQUENIL ) 200 MG tablet Take 200 mg by mouth every other day.     latanoprost (XALATAN) 0.005 % ophthalmic  solution Place 1 drop into the left eye at bedtime.     Misc Natural Products (AIRBORNE ELDERBERRY) CHEW Chew 1 tablet by mouth in the morning and at bedtime.     pantoprazole  (PROTONIX ) 40 MG tablet Take 1 tablet (40 mg total) by mouth daily. 90 tablet 3   Resveratrol 50 MG CAPS Take 50 mg by mouth daily.     rosuvastatin  (CRESTOR ) 40 MG tablet Take 1 tablet (40 mg total) by mouth daily. 90 tablet 3   SIMBRINZA 1-0.2 % SUSP Place 1 drop into the left eye 3 (three) times daily.     Sodium Chloride -Xylitol (XLEAR SINUS CARE SPRAY NA) Place 4 sprays into the nose daily as needed (Moisturing).     Ubiquinol 100 MG CAPS Take 100 mg by mouth.     valsartan  (DIOVAN ) 160 MG tablet Take 1 tablet (160 mg total) by mouth 2 (two) times daily. 180 tablet 1   verapamil  (CALAN -SR) 120 MG CR tablet Take 1 tablet (120 mg total) by mouth at bedtime. 90 tablet 1   No current facility-administered medications for this visit.     ALLERGIES: Amlodipine , Augmentin [amoxicillin-pot clavulanate], Tramadol, and Flexeril [cyclobenzaprine]  Family History  Problem Relation Age of Onset   ALS Mother    Early death Mother    Dementia Father    Hypertension Father    Macular degeneration Maternal Aunt    Alcohol abuse Paternal Uncle    Alcohol abuse Paternal Uncle     Social History   Socioeconomic History   Marital status: Married    Spouse name: Ubaldo   Number of children: 2   Years of education: Not on file   Highest education level: 12th grade  Occupational History   Occupation: retired   Occupation: retired  Tobacco Use   Smoking status: Never    Passive exposure: Never   Smokeless tobacco: Never  Vaping Use   Vaping status: Never Used  Substance and Sexual Activity   Alcohol use: Never   Drug use: Never   Sexual activity: Not Currently    Birth control/protection: Surgical, Post-menopausal    Comment: tubal  Other Topics Concern   Not on file  Social History Narrative   Marital  status/children/pets: Married.  2 children.   Education/employment: Retired.  College-educated.   Safety:      -smoke alarm in the home:Yes     - wears seatbelt: Yes     - Feels safe in their relationships: Yes      Social Drivers of Health   Financial Resource Strain: Low Risk  (06/24/2023)  Overall Financial Resource Strain (CARDIA)    Difficulty of Paying Living Expenses: Not hard at all  Food Insecurity: No Food Insecurity (06/24/2023)   Hunger Vital Sign    Worried About Running Out of Food in the Last Year: Never true    Ran Out of Food in the Last Year: Never true  Transportation Needs: No Transportation Needs (06/24/2023)   PRAPARE - Administrator, Civil Service (Medical): No    Lack of Transportation (Non-Medical): No  Physical Activity: Unknown (06/24/2023)   Exercise Vital Sign    Days of Exercise per Week: 0 days    Minutes of Exercise per Session: Patient declined  Stress: No Stress Concern Present (06/24/2023)   [employer] of Occupational Health - Occupational Stress Questionnaire    Feeling of Stress : Only a little  Social Connections: Moderately Isolated (06/24/2023)   Social Connection and Isolation Panel    Frequency of Communication with Friends and Family: Once a week    Frequency of Social Gatherings with Friends and Family: Never    Attends Religious Services: More than 4 times per year    Active Member of [employer] or Organizations: No    Attends Banker Meetings: Never    Marital Status: Married  Catering manager Violence: Not At Risk (06/24/2023)   Humiliation, Afraid, Rape, and Kick questionnaire    Fear of Current or Ex-Partner: No    Emotionally Abused: No    Physically Abused: No    Sexually Abused: No    Review of Systems  PHYSICAL EXAMINATION:   BP 128/80 (BP Location: Left Arm, Patient Position: Sitting)   Pulse 78   SpO2 99%     General appearance: alert, cooperative and appears stated age   Pelvic: External  genitalia:  no lesions              Urethra:  urethral prolapse. No masses, tenderness or lesions              Bartholins and Skenes: normal                 Vagina: normal appearing vagina with normal color and discharge, vaginal granulation tissue of left lateral wall.               Cervix: no lesions                Bimanual Exam:  Uterus:  normal size, contour, position, consistency, mobility, non-tender              Adnexa: no mass, fullness, tenderness          Chaperone was present for exam:  Dereck BROCKS, CMA  ASSESSMENT:  Incomplete uterovaginal prolapse.  Pessary maintenance. Vaginal granulation tissue.  Hx cervical atypia.  Recent knee fracture.   PLAN:  FU in 3 months.    ***  total time was spent for this patient encounter, including preparation, face-to-face counseling with the patient, coordination of care, and documentation of the encounter.

## 2023-09-22 ENCOUNTER — Encounter: Payer: Self-pay | Admitting: Obstetrics and Gynecology

## 2023-09-22 ENCOUNTER — Ambulatory Visit: Admitting: Obstetrics and Gynecology

## 2023-09-22 VITALS — BP 128/80 | HR 78

## 2023-09-22 DIAGNOSIS — N812 Incomplete uterovaginal prolapse: Secondary | ICD-10-CM | POA: Diagnosis not present

## 2023-09-22 DIAGNOSIS — Z4689 Encounter for fitting and adjustment of other specified devices: Secondary | ICD-10-CM

## 2023-09-22 DIAGNOSIS — N898 Other specified noninflammatory disorders of vagina: Secondary | ICD-10-CM

## 2023-09-23 ENCOUNTER — Encounter: Payer: Self-pay | Admitting: Obstetrics and Gynecology

## 2023-09-24 ENCOUNTER — Ambulatory Visit: Admitting: Physical Therapy

## 2023-09-24 ENCOUNTER — Encounter: Payer: Self-pay | Admitting: Physical Therapy

## 2023-09-24 DIAGNOSIS — R262 Difficulty in walking, not elsewhere classified: Secondary | ICD-10-CM | POA: Diagnosis not present

## 2023-09-24 DIAGNOSIS — M5459 Other low back pain: Secondary | ICD-10-CM | POA: Diagnosis not present

## 2023-09-24 DIAGNOSIS — R293 Abnormal posture: Secondary | ICD-10-CM | POA: Diagnosis not present

## 2023-09-24 DIAGNOSIS — M6281 Muscle weakness (generalized): Secondary | ICD-10-CM | POA: Diagnosis not present

## 2023-09-24 DIAGNOSIS — R2681 Unsteadiness on feet: Secondary | ICD-10-CM | POA: Diagnosis not present

## 2023-09-24 DIAGNOSIS — M25561 Pain in right knee: Secondary | ICD-10-CM | POA: Diagnosis not present

## 2023-09-24 NOTE — Therapy (Signed)
 OUTPATIENT PHYSICAL THERAPY NEURO TREATMENT NOTE   Patient Name: Wendy Jackson MRN: [id number] DOB:12-14-1945, 78 y.o., female Today's Date: 09/25/2023   PCP: Catherine Charlies LABOR, DO REFERRING PROVIDER: Persons, Ronal Dragon, [place]   END OF SESSION:  PT End of Session - 09/24/23 1530     Visit Number 3    Number of Visits 13    Date for PT Re-Evaluation 10/23/23    Authorization Type BCBS medicare    Progress Note Due on Visit 10    PT Start Time 1532    PT Stop Time 1613    PT Time Calculation (min) 41 min    Activity Tolerance Patient tolerated treatment well    Behavior During Therapy [facility] for tasks assessed/performed            Past Medical History:  Diagnosis Date   Cataract 03/2019   Glaucoma Approximately 2005   Horseshoe retinal tear of left eye 04/02/2021   Hyperlipidemia LDL goal <70 01/11/2021   Hypertension 2022   Long-term use of Plaquenil  04/02/2021   Lupus    new rheum questioning original dx. continuing Plaquenil  at lower dose for now.   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 bladder, acquired    Pseudophakia of both eyes 04/02/2021   Retinal detachment with multiple breaks, right eye 04/02/2021   Stroke (HCC) 04/2020   Thyroid  disease    Tibial plateau fracture, right 2025   Past Surgical History:  Procedure Laterality Date   ENDARTERECTOMY Right 05/02/2020   Procedure: RIGHT CAROTID ENDARTERECTOMY;  Surgeon: Oris Krystal FALCON, MD;  Location: [facility] OR;  Service: Vascular;  Laterality: Right;   EYE SURGERY  01/2020   LEFT HEART CATH AND CORONARY ANGIOGRAPHY N/A 02/03/2023   Procedure: LEFT HEART CATH AND CORONARY ANGIOGRAPHY;  Surgeon: Verlin Lonni BIRCH, MD;  Location: [facility] INVASIVE CV LAB;  Service: Cardiovascular;  Laterality: N/A;   SCLERAL BUCKLE Right 2004   TUBAL LIGATION  Feb 1982   Patient Active Problem List   Diagnosis Date Noted   Incontinence of feces with fecal urgency 05/05/2023   Gastroesophageal  reflux disease with esophagitis without hemorrhage 05/05/2023   Bloating 05/05/2023   Other specified abnormal immunological findings in serum 04/03/2023   Elevated erythrocyte sedimentation rate 04/03/2023   High coronary artery calcium  score: 1525; 96th percentile 01/29/2023   SVT (supraventricular tachycardia) (HCC) 12/31/2022   Other fatigue 12/31/2022   Decreased exercise tolerance 12/31/2022   Polymyalgia rheumatica (HCC) 11/25/2022   Polyarthralgia 11/25/2022   Other secondary scoliosis, lumbar region 11/17/2022   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   Other female genital prolapse 10/29/2021   Long-term use of Plaquenil  04/02/2021   Primary open angle glaucoma (POAG) of both eyes, severe stage 04/02/2021   Primary hypertension 01/11/2021   Hyperlipidemia LDL goal <70 01/11/2021   Acquired thrombophilia (HCC) 12/11/2020   BMI less than 19,adult 12/11/2020   Hypothyroidism 12/11/2020   History of CVA (cerebrovascular accident) 05/02/2020   Lupus    Allergic rhinitis    CAD (coronary artery disease) 05/20/2018    ONSET DATE: 09/04/2023 (MD referral)  REFERRING DIAG: M54.50 (ICD-10-CM) - Acute bilateral low back pain without sciatica   THERAPY DIAG:  Other low back pain  Abnormal posture  Muscle weakness (generalized)  Rationale for Evaluation and Treatment: Rehabilitation  SUBJECTIVE:  SUBJECTIVE STATEMENT: Think the pain is a little better every day.  Foot placement makes a big difference with the standing. Pt accompanied by: self and significant other  PERTINENT HISTORY: followed by Dr. Jerri for a lateral nondisplaced tibial plateau fracture.  She has been having to use a walker and bear minimal weight.  She does have a history of significant degenerative scoliosis and degenerative changes  for which she had seen Dr. Barbarann in the past. Also hx of IBS, fecal leakage, has had pelvic floor therapy; polymalgia rheumatica   PAIN:  Are you having pain? Yes: NPRS scale: maybe a little better 5/10 Pain location: bilateral sides and low back Pain description: tight, achy Aggravating factors: sitting, lying back into supine Relieving factors: Unsure other than tylenol , ice  PRECAUTIONS: Fall and Other: hx of tibial plateau fx and per MD note above-minimal weightbearing RLE  RED FLAGS: None   WEIGHT BEARING RESTRICTIONS: Husband reports that MD visit stated NWB R knee, but she has to put a little weight on it with the walker  FALLS: Has patient fallen in last 6 months? Yes. Number of falls 1; 3 falls in past 13 months (all have been tripping)  LIVING ENVIRONMENT: Lives with: lives with their spouse Lives in: House/apartment [place] Stairs: No Has following equipment at home: Single point cane, Walker - 2 wheeled, and single leg scooter (unable to use)  PLOF: Independent  PATIENT GOALS: To get back less tight and painful  OBJECTIVE:    TODAY'S TREATMENT: 09/24/2023 Activity Comments  Review of HEP updates from last visit. Good return demo understanding  Seated upper body/posture exercise: -Forward lean to upright posture -horizontal adduction 2 x 5 reps Seated horizontal adduction 2 x 5 reps Seated scapular retraction, 2 x 5       Yellow band  Yellow band  Abdominal activation: Seated pelvic tilt with alt UE lifts, then BUE lifts BUE lifts with 2.2# ball Reports fatigue in R thoracic area  Supine glut sets 2 x 10 reps   Supine scapular squeezes 2 x 10 reps        TODAY'S TREATMENT: 09/18/2023 Activity Comments  Seated pelvic tilt-ant/posterior With cues for position, then cues for abdominal activation  Seated abdominal activation with UE alt lifts x 5, then BUE lifts   Forward lean at therapy ball, 5 reps, 5 sec hold Forward lean at therapy ball with  trunk rotation, 3 reps each side Pt reports good stretch; also tried with chair in front  Seated postural strengthening:  scapular squeezes x 10 Horizontal shoulder adduct x 8 reps (no resistance)   Good form, no c/o pain  Some pain in R thoracic area  Standing activities at sink to simulate hygiene tasks Pt reports she bends over to do these activities; responds well to cues for stagger stance position and rocking through hips, still with attention to avoid WB through RLE      Reports less pain at end of session.    Access Code: [email or web address] URL: [email or web address] Date: 09/18/2023 Prepared by: [facility] - Outpatient  Rehab - [facility]  Exercises - Supine Pelvic Tilt  - 1-2 x daily - 7 x weekly - 1-2 sets - 5-10 reps - Lower Trunk Rotations  - 1 x daily - 7 x weekly - 2 sets - 10 reps - Seated Flexion Stretch with Swiss Ball  - 1 x daily - 7 x weekly - 1 sets - 3-5 reps - Seated Thoracic Flexion and Rotation with Swiss  Ball  - 1 x daily - 7 x weekly - 1 sets - 3-5 reps - 10-15 sec hold - Seated Pelvic Tilt  - 1 x daily - 7 x weekly - 2 sets - 5-10 reps - Seated Scapular Retraction  - 1 x daily - 7 x weekly - 2 sets - 5-10 reps *Supine scapular retraction Supine glut sets Added 09/24/2023  PATIENT EDUCATION: Education details: Additions to HEP Person educated: Patient and Spouse Education method: Explanation, Demonstration, and Handouts Education comprehension: verbalized understanding, returned demonstration, and needs further education   Note: Objective measures were completed at Evaluation unless otherwise noted.  DIAGNOSTIC FINDINGS: No acute findings per pt/husband report-curvature and severe arthritis  COGNITION: Overall cognitive status: Within functional limits for tasks assessed   SENSATION: Not tested  MUSCLE LENGTH: Hamstrings: Right NT deg; Left -30 deg   POSTURE: rounded shoulders, forward head, increased thoracic kyphosis, weight shift  left, and L shoulder lower than R  LOWER EXTREMITY ROM:   WFL BLEs  Active  Right Eval Left Eval  Hip flexion    Hip extension    Hip abduction    Hip adduction    Hip internal rotation    Hip external rotation    Knee flexion    Knee extension    Ankle dorsiflexion    Ankle plantarflexion    Ankle inversion    Ankle eversion     (Blank rows = not tested)  LOWER EXTREMITY MMT:  Grossly tested 4+/5, no c/o pain in low back or radiating into lower extremities  MMT Right Eval Left Eval  Hip flexion    Hip extension    Hip abduction    Hip adduction    Hip internal rotation    Hip external rotation    Knee flexion    Knee extension    Ankle dorsiflexion    Ankle plantarflexion    Ankle inversion    Ankle eversion    (Blank rows = not tested)  PALPATION:  Tender along lumbar paraspinals bilaterally  BED MOBILITY:  Findings: Tends to go long sit<>supine with pain upon supine; instructed in log roll technique:  supine>L side>sit with min assist and cues  TRANSFERS: Sit to stand: Modified independence  Assistive device utilized: BUE support     Stand to sit: Modified independence  Assistive device utilized: BUE support      GAIT: Findings: Gait Characteristics: per MD note-minimal RLE weightbearing, step to pattern, step through pattern, and decreased stance time- Right, Distance walked: 20 ft, Assistive device utilized:Walker - 2 wheeled, Level of assistance: SBA, and Comments: antalgic pattern  FUNCTIONAL TESTS:  5 times sit to stand: 37.25 sec with BUE support  PATIENT SURVEYS:  Modified Oswestry:  MODIFIED OSWESTRY DISABILITY SCALE  Date: 09/10/2023 Score  Pain intensity 4 =  Pain medication provides me with little relief from pain.  2. Personal care (washing, dressing, etc.) 2 =  It is painful to take care of myself, and I am slow and careful.  3. Lifting 5 =  I cannot lift or carry anything at all.  4. Walking 4 = I can only walk with crutches or a cane.   5. Sitting 3 =  Pain prevents me from sitting more than  hour.  6. Standing 3 =  Pain prevents me from standing more than 1/2 hour.  7. Sleeping 1 = I can sleep well only by using pain medication.  8. Social Life 5 =  I have hardly any  social life because of my pain.  9. Traveling 2 =  My pain restricts my travel over 2 hours.  10. Employment/ Homemaking 3 = Pain prevents me from doing anything but light duties.  Total 32/50= 64%   Interpretation of scores: Score Category Description  0-20% Minimal Disability The patient can cope with most living activities. Usually no treatment is indicated apart from advice on lifting, sitting and exercise  21-40% Moderate Disability The patient experiences more pain and difficulty with sitting, lifting and standing. Travel and social life are more difficult and they may be disabled from work. Personal care, sexual activity and sleeping are not grossly affected, and the patient can usually be managed by conservative means  41-60% Severe Disability Pain remains the main problem in this group, but activities of daily living are affected. These patients require a detailed investigation  61-80% Crippled Back pain impinges on all aspects of the patient's life. Positive intervention is required  81-100% Bed-bound  These patients are either bed-bound or exaggerating their symptoms  Bluford FORBES Zoe DELENA Karon DELENA, et al. Surgery versus conservative management of stable thoracolumbar fracture: the PRESTO feasibility RCT. [place]): [employer]; 2021 Nov. [facility] Technology Assessment, No. 25.62.) Appendix 3, Oswestry Disability Index category descriptors. Available from: FindJewelers.cz  Minimally Clinically Important Difference (MCID) = 12.8%                                                                                                                               TREATMENT DATE: 09/10/2023    PATIENT  EDUCATION: Education details: Eval results, POC initiated HEP, log roll technique for bed mobility Person educated: Patient and Spouse Education method: Explanation, Demonstration, Verbal cues, and Handouts Education comprehension: verbalized understanding, returned demonstration, and needs further education  HOME EXERCISE PROGRAM: Access Code: [email or web address] URL: [email or web address] Date: 09/10/2023 Prepared by: [facility] - Outpatient  Rehab - [facility]  Exercises - Supine Pelvic Tilt  - 1-2 x daily - 7 x weekly - 1-2 sets - 5-10 reps - Lower Trunk Rotations  - 1 x daily - 7 x weekly - 2 sets - 10 reps  GOALS: Goals reviewed with patient? Yes  SHORT TERM GOALS: Target date: 09/25/2023  Pt will be independent with HEP for improved strength, pain, transfers. Baseline: Goal status: INITIAL  2.  Pt will improve 5x sit<>stand to less than or equal to 30 sec to demonstrate improved functional strength and transfer efficiency. Baseline: 37.25 sec Goal status: INITIAL  LONG TERM GOALS: Target date: 10/23/2023  Pt will be independent with progression of HEP for improved pain, strength, transfers. Baseline:  Goal status: INITIAL  2.  Pt will improve Modified Oswestry Score to less than or equal to 50%, to demo less pain interference in daily activities. Baseline: 64% Goal status: INITIAL  3.  Pt will improve 5x sit<>stand to less than or equal to 20 sec to demonstrate improved functional strength and transfer efficiency.  Baseline:  Goal status: INITIAL  4.  Pt will rate pain in low back, decreased by at least 50%, for improved ADLs and gait activities. Baseline: 6-8/10 Goal status: INITIAL  ASSESSMENT:  CLINICAL IMPRESSION: Pt presents today with reports of overall less pain.  She is able to return demo HEP and progress postural exercises throughout session.  Able to utilize yellow theraband for postural strengthening and weighted ball with some abdominal  activation exercises.  She does still seem weak in core stability, as she sinks into posterior pelvic tilt at times, and she does report fatigue in mid back after seated exercises are complete.  She tolerates supine exercises well, including glut sets to help with stability in standing.  She is to go for follow up orthopedic visit tomorrow regarding knee.  Pt will continue to benefit from skilled PT towards goals for improved functional mobility, posture, and decreased pain.     From eval:  Patient is a 78 y.o. female who was seen today for physical therapy evaluation and treatment for acute low back pain. She had a fall on 08/08/23, which resulted in R tibial plateau fracture.  Prior to seeing the MD on 8/13, she was less mobile and began to have back pain.  She denies any bowel/bladder changes, any radiating pain into extremities, and no sensation changes or weakness.  Of note, MD note states minimal weightbearing in RLE; she is using RW; tests were unremarkable (per husband report) for any fractures or changes in low back (other than arthritis).  She presents today with abnormal posture, decreased functional strength, tenderness to palpation along lumbar paraspinal musculature, decreased hamstring flexibility, decreased independence with transfers and gait.  She will benefit from skilled PT to address the above stated deficits to decrease back pain and improve overall functional mobility.  OBJECTIVE IMPAIRMENTS: Abnormal gait, decreased balance, decreased mobility, difficulty walking, decreased strength, impaired flexibility, postural dysfunction, and pain.   ACTIVITY LIMITATIONS: sitting, standing, sleeping, transfers, bed mobility, and locomotion level  PARTICIPATION LIMITATIONS: community activity  PERSONAL FACTORS: 3+ comorbidities: See PMH above are also affecting patient's functional outcome.   REHAB POTENTIAL: Good  CLINICAL DECISION MAKING: Evolving/moderate complexity  EVALUATION  COMPLEXITY: Moderate  PLAN:  PT FREQUENCY: 1-2x/week  PT DURATION: 6 weeks  PLANNED INTERVENTIONS: 97750- Physical Performance Testing, 97110-Therapeutic exercises, 97530- Therapeutic activity, 97112- Neuromuscular re-education, 97535- Self Care, 02859- Manual therapy, [phone]- Gait training, Patient/Family education, and Balance training  PLAN FOR NEXT SESSION: Review HEP updates and progress for abdominal and lumbar strengthening; posture and positioning education; need to follow up with MD to clarify WB restrictions (pt to see MD 09/25/2023)   STARLET GREIG ORN., PT 09/25/2023, 3:48 PM  [facility] [address] [place], [place] [address] Phone # [phone] Fax # [phone]

## 2023-09-24 NOTE — Addendum Note (Signed)
 Addended by: STARLET GREIG ORN on: 09/24/2023 05:10 PM   Modules accepted: Orders

## 2023-09-25 ENCOUNTER — Encounter: Payer: Self-pay | Admitting: Physical Therapy

## 2023-09-25 ENCOUNTER — Ambulatory Visit: Admitting: Orthopaedic Surgery

## 2023-09-25 ENCOUNTER — Other Ambulatory Visit (INDEPENDENT_AMBULATORY_CARE_PROVIDER_SITE_OTHER): Payer: Self-pay

## 2023-09-25 DIAGNOSIS — M25561 Pain in right knee: Secondary | ICD-10-CM

## 2023-09-25 DIAGNOSIS — M545 Low back pain, unspecified: Secondary | ICD-10-CM

## 2023-09-25 NOTE — Progress Notes (Signed)
 Office Visit Note   Patient: Wendy Jackson           Date of Birth: 03/17/1945           MRN: [id number] Visit Date: 09/25/2023              Requested by: Catherine Charlies LABOR, DO [address] [place],  [place] [address] PCP: Catherine Charlies LABOR, DO   Assessment & Plan: Visit Diagnoses:  1. Acute pain of right knee   2. Acute bilateral low back pain without sciatica     Plan: History of Present Illness Wendy Jackson is a 78 year old female who presents for follow-up of a right knee fracture and back pain.  She is attending physical therapy twice a week for back pain and reports no back pain in the last two to three weeks. A CT scan identified a small area of depression in her right knee, but she has not experienced knee pain for the past two to three weeks. She is not using weight-bearing aids but is open to trying a cane to improve her walking confidence. She is considering physical therapy to strengthen her knee.  Physical Exam MUSCULOSKELETAL: No tenderness on palpation of right knee plateau.  Assessment and Plan Healing right tibial plateau fracture Fracture healing well, no pain or symptoms reported. Advised activity advancement based on comfort. Discussed transitioning to a cane for improved mobility. - Order x-ray of the right knee to assess fracture healing. - Initiate physical therapy for knee rehabilitation and strengthening. - Advise activity advancement as tolerated based on comfort. - Discuss with physical therapist about transitioning to a cane for walking assistance.  Follow-Up Instructions: Return if symptoms worsen or fail to improve.   Orders:  Orders Placed This Encounter  Procedures   XR Knee 1-2 Views Right   Ambulatory referral to Physical Therapy   No orders of the defined types were placed in this encounter.     Procedures: No procedures performed   Clinical Data: No additional findings.   Subjective: Chief Complaint  Patient presents with   Lower  Back - Pain    HPI  Review of Systems  Constitutional: Negative.   HENT: Negative.    Eyes: Negative.   Respiratory: Negative.    Cardiovascular: Negative.   Endocrine: Negative.   Musculoskeletal: Negative.   Neurological: Negative.   Hematological: Negative.   Psychiatric/Behavioral: Negative.    All other systems reviewed and are negative.    Objective: Vital Signs: There were no vitals taken for this visit.  Physical Exam Vitals and nursing note reviewed.  Constitutional:      Appearance: She is well-developed.  HENT:     Head: Atraumatic.     Nose: Nose normal.  Eyes:     Extraocular Movements: Extraocular movements intact.  Cardiovascular:     Pulses: Normal pulses.  Pulmonary:     Effort: Pulmonary effort is normal.  Abdominal:     Palpations: Abdomen is soft.  Musculoskeletal:     Cervical back: Neck supple.  Skin:    General: Skin is warm.     Capillary Refill: Capillary refill takes less than 2 seconds.  Neurological:     Mental Status: She is alert. Mental status is at baseline.  Psychiatric:        Behavior: Behavior normal.        Thought Content: Thought content normal.        Judgment: Judgment normal.     Ortho  Exam  Specialty Comments:  No specialty comments available.  Imaging: XR Knee 1-2 Views Right Result Date: 09/25/2023 X-rays of the right knee show consolidation of depressed lateral tibial plateau fracture    PMFS History: Patient Active Problem List   Diagnosis Date Noted   Incontinence of feces with fecal urgency 05/05/2023   Gastroesophageal reflux disease with esophagitis without hemorrhage 05/05/2023   Bloating 05/05/2023   Other specified abnormal immunological findings in serum 04/03/2023   Elevated erythrocyte sedimentation rate 04/03/2023   High coronary artery calcium  score: 1525; 96th percentile 01/29/2023   SVT (supraventricular tachycardia) (HCC) 12/31/2022   Other fatigue 12/31/2022   Decreased exercise  tolerance 12/31/2022   Polymyalgia rheumatica (HCC) 11/25/2022   Polyarthralgia 11/25/2022   Other secondary scoliosis, lumbar region 11/17/2022   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   Other female genital prolapse 10/29/2021   Long-term use of Plaquenil  04/02/2021   Primary open angle glaucoma (POAG) of both eyes, severe stage 04/02/2021   Primary hypertension 01/11/2021   Hyperlipidemia LDL goal <70 01/11/2021   Acquired thrombophilia (HCC) 12/11/2020   BMI less than 19,adult 12/11/2020   Hypothyroidism 12/11/2020   History of CVA (cerebrovascular accident) 05/02/2020   Lupus    Allergic rhinitis    CAD (coronary artery disease) 05/20/2018   Past Medical History:  Diagnosis Date   Cataract 03/2019   Glaucoma Approximately 2005   Horseshoe retinal tear of left eye 04/02/2021   Hyperlipidemia LDL goal <70 01/11/2021   Hypertension 2022   Long-term use of Plaquenil  04/02/2021   Lupus    new rheum questioning original dx. continuing Plaquenil  at lower dose for now.   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 bladder, acquired    Pseudophakia of both eyes 04/02/2021   Retinal detachment with multiple breaks, right eye 04/02/2021   Stroke (HCC) 04/2020   Thyroid  disease    Tibial plateau fracture, right 2025    Family History  Problem Relation Age of Onset   ALS Mother    Early death Mother    Dementia Father    Hypertension Father    Macular degeneration Maternal Aunt    Alcohol abuse Paternal Uncle    Alcohol abuse Paternal Uncle     Past Surgical History:  Procedure Laterality Date   ENDARTERECTOMY Right 05/02/2020   Procedure: RIGHT CAROTID ENDARTERECTOMY;  Surgeon: Oris Krystal FALCON, MD;  [facility]: [facility];  Service: Vascular;  Laterality: Right;   EYE SURGERY  01/2020   LEFT HEART CATH AND CORONARY ANGIOGRAPHY N/A 02/03/2023   Procedure: LEFT HEART CATH AND CORONARY ANGIOGRAPHY;  Surgeon: Verlin Lonni BIRCH, MD;  Location: [facility] INVASIVE  CV LAB;  Service: Cardiovascular;  Laterality: N/A;   SCLERAL BUCKLE Right 2004   TUBAL LIGATION  Feb 1982   Social History   Occupational History   Occupation: retired   Occupation: retired  Tobacco Use   Smoking status: Never    Passive exposure: Never   Smokeless tobacco: Never  Vaping Use   Vaping status: Never Used  Substance and Sexual Activity   Alcohol use: Never   Drug use: Never   Sexual activity: Not Currently    Birth control/protection: Surgical, Post-menopausal    Comment: tubal

## 2023-09-28 NOTE — Therapy (Signed)
 OUTPATIENT PHYSICAL THERAPY NEURO TREATMENT NOTE   Patient Name: Wendy Jackson MRN: [id number] DOB:Jun 03, 1945, 78 y.o., female Today's Date: 09/28/2023   PCP: Catherine Charlies LABOR, DO REFERRING PROVIDER: Persons, Ronal Dragon, [place]   END OF SESSION:      Past Medical History:  Diagnosis Date   Cataract 03/2019   Glaucoma Approximately 2005   H[place]hoe retinal tear of left eye 04/02/2021   Hyperlipidemia LDL goal <70 01/11/2021   Hypertension 2022   Long-term use of Plaquenil  04/02/2021   Lupus    new rheum questioning original dx. continuing Plaquenil  at lower dose for now.   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 bladder, acquired    Pseudophakia of both eyes 04/02/2021   Retinal detachment with multiple breaks, right eye 04/02/2021   Stroke (HCC) 04/2020   Thyroid  disease    Tibial plateau fracture, right 2025   Past Surgical History:  Procedure Laterality Date   ENDARTERECTOMY Right 05/02/2020   Procedure: RIGHT CAROTID ENDARTERECTOMY;  Surgeon: Oris Krystal FALCON, MD;  Location: [facility] OR;  Service: Vascular;  Laterality: Right;   EYE SURGERY  01/2020   LEFT HEART CATH AND CORONARY ANGIOGRAPHY N/A 02/03/2023   Procedure: LEFT HEART CATH AND CORONARY ANGIOGRAPHY;  Surgeon: Verlin Lonni BIRCH, MD;  Location: [facility] INVASIVE [facility] LAB;  Service: Cardiovascular;  Laterality: N/A;   SCLERAL BUCKLE Right 2004   TUBAL LIGATION  Feb 1982   Patient Active Problem List   Diagnosis Date Noted   Incontinence of feces with fecal urgency 05/05/2023   Gastroesophageal reflux disease with esophagitis without hemorrhage 05/05/2023   Bloating 05/05/2023   Other specified abnormal immunological findings in serum 04/03/2023   Elevated erythrocyte sedimentation rate 04/03/2023   High coronary artery calcium  score: 1525; 96th percentile 01/29/2023   SVT (supraventricular tachycardia) (HCC) 12/31/2022   Other fatigue 12/31/2022   Decreased exercise tolerance  12/31/2022   Polymyalgia rheumatica (HCC) 11/25/2022   Polyarthralgia 11/25/2022   Other secondary scoliosis, lumbar region 11/17/2022   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   Other female genital prolapse 10/29/2021   Long-term use of Plaquenil  04/02/2021   Primary open angle glaucoma (POAG) of both eyes, severe stage 04/02/2021   Primary hypertension 01/11/2021   Hyperlipidemia LDL goal <70 01/11/2021   Acquired thrombophilia (HCC) 12/11/2020   BMI less than 19,adult 12/11/2020   Hypothyroidism 12/11/2020   History of CVA (cerebrovascular accident) 05/02/2020   Lupus    Allergic rhinitis    CAD (coronary artery disease) 05/20/2018    ONSET DATE: 09/04/2023 (MD referral)  REFERRING DIAG: M54.50 (ICD-10-CM) - Acute bilateral low back pain without sciatica   THERAPY DIAG:  No diagnosis found.  Rationale for Evaluation and Treatment: Rehabilitation  SUBJECTIVE:  SUBJECTIVE STATEMENT: Think the pain is a little better every day.  Foot placement makes a big difference with the standing. Pt accompanied by: self and significant other  PERTINENT HISTORY: followed by Dr. Jerri for a lateral nondisplaced tibial plateau fracture.  She has been having to use a walker and bear minimal weight.  She does have a history of significant degenerative scoliosis and degenerative changes for which she had seen Dr. Barbarann in the past. Also hx of IBS, fecal leakage, has had pelvic floor therapy; polymalgia rheumatica   PAIN:  Are you having pain? Yes: NPRS scale: maybe a little better 5/10 Pain location: bilateral sides and low back Pain description: tight, achy Aggravating factors: sitting, lying back into supine Relieving factors: Unsure other than tylenol , ice  PRECAUTIONS: Fall and Other: hx of tibial plateau fx and per  MD note above-minimal weightbearing RLE  RED FLAGS: None   WEIGHT BEARING RESTRICTIONS: Husband reports that MD visit stated NWB R knee, but she has to put a little weight on it with the walker  FALLS: Has patient fallen in last 6 months? Yes. Number of falls 1; 3 falls in past 13 months (all have been tripping)  LIVING ENVIRONMENT: Lives with: lives with their spouse Lives in: House/apartment [place] Stairs: No Has following equipment at home: Single point cane, Walker - 2 wheeled, and single leg scooter (unable to use)  PLOF: Independent  PATIENT GOALS: To get back less tight and painful  OBJECTIVE:     TODAY'S TREATMENT: 09/29/23 Activity Comments                        TODAY'S TREATMENT: 09/24/2023 Activity Comments  Review of HEP updates from last visit. Good return demo understanding  Seated upper body/posture exercise: -Forward lean to upright posture -horizontal adduction 2 x 5 reps Seated horizontal adduction 2 x 5 reps Seated scapular retraction, 2 x 5       Yellow band  Yellow band  Abdominal activation: Seated pelvic tilt with alt UE lifts, then BUE lifts BUE lifts with 2.2# ball Reports fatigue in R thoracic area  Supine glut sets 2 x 10 reps   Supine scapular squeezes 2 x 10 reps        TODAY'S TREATMENT: 09/18/2023 Activity Comments  Seated pelvic tilt-ant/posterior With cues for position, then cues for abdominal activation  Seated abdominal activation with UE alt lifts x 5, then BUE lifts   Forward lean at therapy ball, 5 reps, 5 sec hold Forward lean at therapy ball with trunk rotation, 3 reps each side Pt reports good stretch; also tried with chair in front  Seated postural strengthening:  scapular squeezes x 10 Horizontal shoulder adduct x 8 reps (no resistance)   Good form, no c/o pain  Some pain in R thoracic area  Standing activities at sink to simulate hygiene tasks Pt reports she bends over to do these activities; responds  well to cues for stagger stance position and rocking through hips, still with attention to avoid WB through RLE      Reports less pain at end of session.    Access Code: [email or web address] URL: [email or web address] Date: 09/18/2023 Prepared by: [facility] - Outpatient  Rehab - [facility]  Exercises - Supine Pelvic Tilt  - 1-2 x daily - 7 x weekly - 1-2 sets - 5-10 reps - Lower Trunk Rotations  - 1 x daily - 7 x weekly - 2 sets - 10 reps -  Seated Flexion Stretch with Swiss Ball  - 1 x daily - 7 x weekly - 1 sets - 3-5 reps - Seated Thoracic Flexion and Rotation with Swiss Ball  - 1 x daily - 7 x weekly - 1 sets - 3-5 reps - 10-15 sec hold - Seated Pelvic Tilt  - 1 x daily - 7 x weekly - 2 sets - 5-10 reps - Seated Scapular Retraction  - 1 x daily - 7 x weekly - 2 sets - 5-10 reps *Supine scapular retraction Supine glut sets Added 09/24/2023  PATIENT EDUCATION: Education details: Additions to HEP Person educated: Patient and Spouse Education method: Explanation, Demonstration, and Handouts Education comprehension: verbalized understanding, returned demonstration, and needs further education   Note: Objective measures were completed at Evaluation unless otherwise noted.  DIAGNOSTIC FINDINGS: No acute findings per pt/husband report-curvature and severe arthritis  COGNITION: Overall cognitive status: Within functional limits for tasks assessed   SENSATION: Not tested  MUSCLE LENGTH: Hamstrings: Right NT deg; Left -30 deg   POSTURE: rounded shoulders, forward head, increased thoracic kyphosis, weight shift left, and L shoulder lower than R  LOWER EXTREMITY ROM:   WFL BLEs  Active  Right Eval Left Eval  Hip flexion    Hip extension    Hip abduction    Hip adduction    Hip internal rotation    Hip external rotation    Knee flexion    Knee extension    Ankle dorsiflexion    Ankle plantarflexion    Ankle inversion    Ankle eversion     (Blank rows = not  tested)  LOWER EXTREMITY MMT:  Grossly tested 4+/5, no c/o pain in low back or radiating into lower extremities  MMT Right Eval Left Eval  Hip flexion    Hip extension    Hip abduction    Hip adduction    Hip internal rotation    Hip external rotation    Knee flexion    Knee extension    Ankle dorsiflexion    Ankle plantarflexion    Ankle inversion    Ankle eversion    (Blank rows = not tested)  PALPATION:  Tender along lumbar paraspinals bilaterally  BED MOBILITY:  Findings: Tends to go long sit<>supine with pain upon supine; instructed in log roll technique:  supine>L side>sit with min assist and cues  TRANSFERS: Sit to stand: Modified independence  Assistive device utilized: BUE support     Stand to sit: Modified independence  Assistive device utilized: BUE support      GAIT: Findings: Gait Characteristics: per MD note-minimal RLE weightbearing, step to pattern, step through pattern, and decreased stance time- Right, Distance walked: 20 ft, Assistive device utilized:Walker - 2 wheeled, Level of assistance: SBA, and Comments: antalgic pattern  FUNCTIONAL TESTS:  5 times sit to stand: 37.25 sec with BUE support  PATIENT SURVEYS:  Modified Oswestry:  MODIFIED OSWESTRY DISABILITY SCALE  Date: 09/10/2023 Score  Pain intensity 4 =  Pain medication provides me with little relief from pain.  2. Personal care (washing, dressing, etc.) 2 =  It is painful to take care of myself, and I am slow and careful.  3. Lifting 5 =  I cannot lift or carry anything at all.  4. Walking 4 = I can only walk with crutches or a cane.  5. Sitting 3 =  Pain prevents me from sitting more than  hour.  6. Standing 3 =  Pain prevents me from standing more  than 1/2 hour.  7. Sleeping 1 = I can sleep well only by using pain medication.  8. Social Life 5 =  I have hardly any social life because of my pain.  9. Traveling 2 =  My pain restricts my travel over 2 hours.  10. Employment/ Homemaking 3 =  Pain prevents me from doing anything but light duties.  Total 32/50= 64%   Interpretation of scores: Score Category Description  0-20% Minimal Disability The patient can cope with most living activities. Usually no treatment is indicated apart from advice on lifting, sitting and exercise  21-40% Moderate Disability The patient experiences more pain and difficulty with sitting, lifting and standing. Travel and social life are more difficult and they may be disabled from work. Personal care, sexual activity and sleeping are not grossly affected, and the patient can usually be managed by conservative means  41-60% Severe Disability Pain remains the main problem in this group, but activities of daily living are affected. These patients require a detailed investigation  61-80% Crippled Back pain impinges on all aspects of the patient's life. Positive intervention is required  81-100% Bed-bound  These patients are either bed-bound or exaggerating their symptoms  Bluford FORBES Zoe DELENA Karon DELENA, et al. Surgery versus conservative management of stable thoracolumbar fracture: the PRESTO feasibility RCT. [place] ([place]): [employer]; 2021 Nov. [facility] Technology Assessment, No. 25.62.) Appendix 3, Oswestry Disability Index category descriptors. Available from: FindJewel[employer].cz  Minimally Clinically Important Difference (MCID) = 12.8%                                                                                                                               TREATMENT DATE: 09/10/2023    PATIENT EDUCATION: Education details: Eval results, POC initiated HEP, log roll technique for bed mobility Person educated: Patient and Spouse Education method: Explanation, Demonstration, Verbal cues, and Handouts Education comprehension: verbalized understanding, returned demonstration, and needs further education  HOME EXERCISE PROGRAM: Access Code: [email or web address] URL:  [email or web address] Date: 09/10/2023 Prepared by: [facility] - Outpatient  Rehab - [facility]  Exercises - Supine Pelvic Tilt  - 1-2 x daily - 7 x weekly - 1-2 sets - 5-10 reps - Lower Trunk Rotations  - 1 x daily - 7 x weekly - 2 sets - 10 reps  GOALS: Goals reviewed with patient? Yes  SHORT TERM GOALS: Target date: 09/25/2023  Pt will be independent with HEP for improved strength, pain, transfers. Baseline: Goal status: INITIAL  2.  Pt will improve 5x sit<>stand to less than or equal to 30 sec to demonstrate improved functional strength and transfer efficiency. Baseline: 37.25 sec Goal status: INITIAL  LONG TERM GOALS: Target date: 10/23/2023  Pt will be independent with progression of HEP for improved pain, strength, transfers. Baseline:  Goal status: INITIAL  2.  Pt will improve Modified Oswestry Score to less than or equal to 50%, to demo less pain interference in daily activities.  Baseline: 64% Goal status: INITIAL  3.  Pt will improve 5x sit<>stand to less than or equal to 20 sec to demonstrate improved functional strength and transfer efficiency. Baseline:  Goal status: INITIAL  4.  Pt will rate pain in low back, decreased by at least 50%, for improved ADLs and gait activities. Baseline: 6-8/10 Goal status: INITIAL  ASSESSMENT:  CLINICAL IMPRESSION: Pt presents today with reports of overall less pain.  She is able to return demo HEP and progress postural exercises throughout session.  Able to utilize yellow theraband for postural strengthening and weighted ball with some abdominal activation exercises.  She does still seem weak in core stability, as she sinks into posterior pelvic tilt at times, and she does report fatigue in mid back after seated exercises are complete.  She tolerates supine exercises well, including glut sets to help with stability in standing.  She is to go for follow up orthopedic visit tomorrow regarding knee.  Pt will  continue to benefit from skilled PT towards goals for improved functional mobility, posture, and decreased pain.     From eval:  Patient is a 78 y.o. female who was seen today for physical therapy evaluation and treatment for acute low back pain. She had a fall on 08/08/23, which resulted in R tibial plateau fracture.  Prior to seeing the MD on 8/13, she was less mobile and began to have back pain.  She denies any bowel/bladder changes, any radiating pain into extremities, and no sensation changes or weakness.  Of note, MD note states minimal weightbearing in RLE; she is using RW; tests were unremarkable (per husband report) for any fractures or changes in low back (other than arthritis).  She presents today with abnormal posture, decreased functional strength, tenderness to palpation along lumbar paraspinal musculature, decreased hamstring flexibility, decreased independence with transfers and gait.  She will benefit from skilled PT to address the above stated deficits to decrease back pain and improve overall functional mobility.  OBJECTIVE IMPAIRMENTS: Abnormal gait, decreased balance, decreased mobility, difficulty walking, decreased strength, impaired flexibility, postural dysfunction, and pain.   ACTIVITY LIMITATIONS: sitting, standing, sleeping, transfers, bed mobility, and locomotion level  PARTICIPATION LIMITATIONS: community activity  PERSONAL FACTORS: 3+ comorbidities: See PMH above are also affecting patient's functional outcome.   REHAB POTENTIAL: Good  CLINICAL DECISION MAKING: Evolving/moderate complexity  EVALUATION COMPLEXITY: Moderate  PLAN:  PT FREQUENCY: 1-2x/week  PT DURATION: 6 weeks  PLANNED INTERVENTIONS: 97750- Physical Performance Testing, 97110-Therapeutic exercises, 97530- Therapeutic activity, 97112- Neuromuscular re-education, 97535- Self Care, [id number]- Manual therapy, [phone]- Gait training, Patient/Family education, and Balance training  PLAN FOR NEXT SESSION:  Review HEP updates and progress for abdominal and lumbar strengthening; posture and positioning education; need to follow up with MD to clarify WB restrictions (pt to see MD 09/25/2023)   Slater MARLA Christians, PT 09/28/2023, 1:13 PM  [facility] [address], [address] [place], [place] [address] Phone # [phone] Fax # [phone]

## 2023-09-29 ENCOUNTER — Ambulatory Visit: Admitting: Physical Therapy

## 2023-09-29 ENCOUNTER — Encounter: Payer: Self-pay | Admitting: Physical Therapy

## 2023-09-29 DIAGNOSIS — R2681 Unsteadiness on feet: Secondary | ICD-10-CM

## 2023-09-29 DIAGNOSIS — M6281 Muscle weakness (generalized): Secondary | ICD-10-CM | POA: Diagnosis not present

## 2023-09-29 DIAGNOSIS — M25561 Pain in right knee: Secondary | ICD-10-CM

## 2023-09-29 DIAGNOSIS — R293 Abnormal posture: Secondary | ICD-10-CM | POA: Diagnosis not present

## 2023-09-29 DIAGNOSIS — R262 Difficulty in walking, not elsewhere classified: Secondary | ICD-10-CM

## 2023-09-29 DIAGNOSIS — M5459 Other low back pain: Secondary | ICD-10-CM | POA: Diagnosis not present

## 2023-10-01 ENCOUNTER — Encounter: Payer: Self-pay | Admitting: Physical Therapy

## 2023-10-01 ENCOUNTER — Ambulatory Visit: Admitting: Physical Therapy

## 2023-10-01 ENCOUNTER — Encounter: Payer: Self-pay | Admitting: Pharmacist

## 2023-10-01 DIAGNOSIS — R293 Abnormal posture: Secondary | ICD-10-CM

## 2023-10-01 DIAGNOSIS — M5459 Other low back pain: Secondary | ICD-10-CM

## 2023-10-01 DIAGNOSIS — M6281 Muscle weakness (generalized): Secondary | ICD-10-CM

## 2023-10-01 DIAGNOSIS — R2681 Unsteadiness on feet: Secondary | ICD-10-CM

## 2023-10-01 DIAGNOSIS — M25561 Pain in right knee: Secondary | ICD-10-CM | POA: Diagnosis not present

## 2023-10-01 DIAGNOSIS — R262 Difficulty in walking, not elsewhere classified: Secondary | ICD-10-CM

## 2023-10-01 NOTE — Therapy (Signed)
 OUTPATIENT PHYSICAL THERAPY NEURO    Patient Name: Wendy Jackson MRN: [id number] DOB:[birth year], 78 y.o., female Today's Date: 10/01/2023   PCP: Catherine Charlies LABOR, DO REFERRING PROVIDER: Persons, Ronal Dragon, [place]  (Additional referral from Jerri Kay HERO, MD for [phone] (ICD-10-CM) - Acute pain of right knee M54.50 (ICD-10-CM) - Acute bilateral low back pain without sciatica)    END OF SESSION:  PT End of Session - 10/01/23 1301     Visit Number 5    Number of Visits 13    Date for Recertification  10/23/23    Authorization Type [employer]BS medicare    Progress Note Due on Visit 10    PT Start Time 1305    PT Stop Time 1345    PT Time Calculation (min) 40 min    Equipment Utilized During Treatment Gait belt    Activity Tolerance Patient tolerated treatment well    Behavior During Therapy [facility] for tasks assessed/performed            Past Medical History:  Diagnosis Date   Cataract 03/2019   Glaucoma Approximately 2005   [place] retinal tear of left eye 04/02/2021   Hyperlipidemia LDL goal <70 01/11/2021   Hypertension 2022   Long-term use of Plaquenil  04/02/2021   Lupus    new rheum questioning original dx. continuing Plaquenil  at lower dose for now.   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 bladder, acquired    Pseudophakia of both eyes 04/02/2021   Retinal detachment with multiple breaks, right eye 04/02/2021   Stroke (HCC) 04/2020   Thyroid  disease    Tibial plateau fracture, right 2025   Past Surgical History:  Procedure Laterality Date   ENDARTERECTOMY Right 05/02/2020   Procedure: RIGHT CAROTID ENDARTERECTOMY;  Surgeon: Oris Krystal FALCON, MD;  Location: [facility];  Service: Vascular;  Laterality: Right;   EYE SURGERY  01/2020   LEFT HEART CATH AND CORONARY ANGIOGRAPHY N/A 02/03/2023   Procedure: LEFT HEART CATH AND CORONARY ANGIOGRAPHY;  Surgeon: Verlin Lonni BIRCH, MD;  Location: [facility] INVASIVE CV LAB;  Service: Cardiovascular;   Laterality: N/A;   SCLERAL BUCKLE Right 2004   TUBAL LIGATION  Feb 1982   Patient Active Problem List   Diagnosis Date Noted   Incontinence of feces with fecal urgency 05/05/2023   Gastroesophageal reflux disease with esophagitis without hemorrhage 05/05/2023   Bloating 05/05/2023   Other specified abnormal immunological findings in serum 04/03/2023   Elevated erythrocyte sedimentation rate 04/03/2023   High coronary artery calcium  score: 1525; 96th percentile 01/29/2023   SVT (supraventricular tachycardia) (HCC) 12/31/2022   Other fatigue 12/31/2022   Decreased exercise tolerance 12/31/2022   Polymyalgia rheumatica (HCC) 11/25/2022   Polyarthralgia 11/25/2022   Other secondary scoliosis, lumbar region 11/17/2022   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   Other female genital prolapse 10/29/2021   Long-term use of Plaquenil  04/02/2021   Primary open angle glaucoma (POAG) of both eyes, severe stage 04/02/2021   Primary hypertension 01/11/2021   Hyperlipidemia LDL goal <70 01/11/2021   Acquired thrombophilia (HCC) 12/11/2020   BMI less than 19,adult 12/11/2020   Hypothyroidism 12/11/2020   History of CVA (cerebrovascular accident) 05/02/2020   Lupus    Allergic rhinitis    CAD (coronary artery disease) 05/20/2018    ONSET DATE: 09/04/2023 (MD referral)  REFERRING DIAG: M54.50 (ICD-10-CM) - Acute bilateral low back pain without sciatica  New referral in patient's chart from Dr. Jerri stating Right tibial plateau fracture.  WBAT. Knee rehab. Strengthening. Gait training.   THERAPY DIAG:  Other low back pain  Abnormal posture  Muscle weakness (generalized)  Acute pain of right knee  Difficulty in walking, not elsewhere classified  Unsteadiness on feet  Rationale for Evaluation and Treatment: Rehabilitation  SUBJECTIVE:                                                                                                                                                                                              SUBJECTIVE STATEMENT: Pt reports some medial R knee pain. Pt states her back is fine. Reports difficulty standing straight up. Will still get some back pain on the right after long period of standing.  Pt accompanied by: self and significant other  PERTINENT HISTORY: followed by Dr. Jerri for a lateral nondisplaced tibial plateau fracture.  She has been having to use a walker and bear minimal weight.  She does have a history of significant degenerative scoliosis and degenerative changes for which she had seen Dr. Barbarann in the past. Also hx of IBS, fecal leakage, has had pelvic floor therapy; polymalgia rheumatica   PAIN:  Are you having pain? Yes: NPRS scale: 0/10 Pain location: bilateral sides and low back Pain description: tight, achy Aggravating factors: sitting, lying back into supine Relieving factors: Unsure other than tylenol , ice  PRECAUTIONS: Fall and Other: 10/01/23 pt reports she is now full weight bearing on R  RED FLAGS: None   WEIGHT BEARING RESTRICTIONS: None  FALLS: Has patient fallen in last 6 months? Yes. Number of falls 1; 3 falls in past 13 months (all have been tripping)  LIVING ENVIRONMENT: Lives with: lives with their spouse Lives in: House/apartment [place] Stairs: No Has following equipment at home: Single point cane, Environmental consultant - 2 wheeled, and single leg scooter (unable to use)  PLOF: Independent  PATIENT GOALS: To get back less tight and painful  OBJECTIVE:   TODAY'S TREATMENT: 10/01/23 Activity Comments  Nustep L4 x 8 min UEs/LEs Dynamic warm up  Seated hamstring 2x30 sec   Seated quad set x10 R   Seated SLR 2x10 R    Seated clamshell red TB 2x10   Seated hamstring curl red TB 2x10   Standing hip extension 2x10   Standing scap retraction with glute setting 2x10   Standing hip hike on R x10   Gait training with small based quad tipped cane Min A for sequencing. Cueing to decrease posterior lean and shift weight forward     TODAY'S TREATMENT: 09/29/23 Activity Comments  test  1.9 ft/sec with RW  Gait trailing with SPC and quad tip  cane 2x53ft  Initially providing min A to assist with cane sequencing, then able to perform with only CGA while providing verbal cueing for proper timing                  PATIENT EDUCATION: Education details: HEP updates Person educated: Patient and Spouse Education method: Explanation, Demonstration, Tactile cues, Verbal cues, and Handouts Education comprehension: verbalized understanding and returned demonstration    Access Code: [email or web address] URL: [email or web address] Date: 10/01/2023 Prepared by: Raesha Coonrod April Earnie Starring  Exercises - Supine Pelvic Tilt  - 1-2 x daily - 7 x weekly - 1-2 sets - 5-10 reps - Lower Trunk Rotations  - 1 x daily - 7 x weekly - 2 sets - 10 reps - Seated Flexion Stretch with Swiss Ball  - 1 x daily - 7 x weekly - 1 sets - 3-5 reps - Seated Thoracic Flexion and Rotation with Swiss Ball  - 1 x daily - 7 x weekly - 1 sets - 3-5 reps - 10-15 sec hold - Seated Pelvic Tilt  - 1 x daily - 7 x weekly - 2 sets - 5-10 reps - Supine Scapular Retraction  - 1 x daily - 7 x weekly - 2 sets - 10 reps - Seated Hamstring Stretch  - 1 x daily - 7 x weekly - 2 sets - 30 sec hold - Seated Active Straight-Leg Raise  - 1 x daily - 7 x weekly - 2 sets - 10 reps - 3 sec hold - Seated Hip Abduction with Resistance  - 1 x daily - 7 x weekly - 2 sets - 10 reps - 3 sec hold - Seated Hamstring Curls with Resistance  - 1 x daily - 7 x weekly - 2 sets - 10 reps - 3 sec hold - Standing Hip Extension with Counter Support  - 1 x daily - 7 x weekly - 2 sets - 10 reps - Standing Gluteal Sets  - 1 x daily - 7 x weekly - 2 sets - 10 reps - 3 sec hold - Standing Scapular Retraction  - 1 x daily - 7 x weekly - 3 sets - 10 reps   Note: Objective measures were completed at Evaluation unless otherwise noted.  DIAGNOSTIC FINDINGS: No acute findings per  pt/husband report-curvature and severe arthritis  COGNITION: Overall cognitive status: Within functional limits for tasks assessed   SENSATION: Not tested  MUSCLE LENGTH: Hamstrings: Right NT deg; Left -30 deg   LOWER EXTREMITY MMT:     Active (in sitting) Right 9/16 Left 9/16  Hip flexion 4+ 4  Hip extension    Hip abduction 4 4  Hip adduction 4+ 4+  Hip internal rotation    Hip external rotation    Knee flexion 4+ 4+  Knee extension 4+ 5  Ankle dorsiflexion 4 4  Ankle plantarflexion 4+ 4+  Ankle inversion    Ankle eversion     (Blank rows = not tested)  LOWER EXTREMITY ROM:    ROM Right 9/16 Left 9/16  Knee flexion 134 135  Knee extension 3 0  (Blank rows = not tested)  POSTURE: rounded shoulders, forward head, increased thoracic kyphosis, weight shift left, and L shoulder lower than R   PALPATION:  Tender along lumbar paraspinals bilaterally  BED MOBILITY:  Findings: Tends to go long sit<>supine with pain upon supine; instructed in log roll technique:  supine>L side>sit with min assist and cues  TRANSFERS: Sit to stand: Modified independence  Assistive  device utilized: BUE support     Stand to sit: Modified independence  Assistive device utilized: BUE support      GAIT: Findings: Gait Characteristics: per MD note-minimal RLE weightbearing, step to pattern, step through pattern, and decreased stance time- Right, Distance walked: 20 ft, Assistive device utilized:Walker - 2 wheeled, Level of assistance: SBA, and Comments: antalgic pattern  FUNCTIONAL TESTS:  5 times sit to stand: 37.25 sec with BUE support  PATIENT SURVEYS:  Modified Oswestry:  MODIFIED OSWESTRY DISABILITY SCALE  Date: 09/10/2023 Score  Pain intensity 4 =  Pain medication provides me with little relief from pain.  2. Personal care (washing, dressing, etc.) 2 =  It is painful to take care of myself, and I am slow and careful.  3. Lifting 5 =  I cannot lift or carry anything at all.  4.  Walking 4 = I can only walk with crutches or a cane.  5. Sitting 3 =  Pain prevents me from sitting more than  hour.  6. Standing 3 =  Pain prevents me from standing more than 1/2 hour.  7. Sleeping 1 = I can sleep well only by using pain medication.  8. Social Life 5 =  I have hardly any social life because of my pain.  9. Traveling 2 =  My pain restricts my travel over 2 hours.  10. Employment/ Homemaking 3 = Pain prevents me from doing anything but light duties.  Total 32/50= 64%   Interpretation of scores: Score Category Description  0-20% Minimal Disability The patient can cope with most living activities. Usually no treatment is indicated apart from advice on lifting, sitting and exercise  21-40% Moderate Disability The patient experiences more pain and difficulty with sitting, lifting and standing. Travel and social life are more difficult and they may be disabled from work. Personal care, sexual activity and sleeping are not grossly affected, and the patient can usually be managed by conservative means  41-60% Severe Disability Pain remains the main problem in this group, but activities of daily living are affected. These patients require a detailed investigation  61-80% Crippled Back pain impinges on all aspects of the patient's life. Positive intervention is required  81-100% Bed-bound  These patients are either bed-bound or exaggerating their symptoms  Bluford FORBES Zoe DELENA Karon DELENA, et al. Surgery versus conservative management of stable thoracolumbar fracture: the PRESTO feasibility RCT. [place]): [employer]; 2021 Nov. [facility] Technology Assessment, No. 25.62.) Appendix 3, Oswestry Disability Index category descriptors. Available from: FindJewelers.cz  Minimally Clinically Important Difference (MCID) = 12.8%       GOALS: Goals reviewed with patient? Yes  SHORT TERM GOALS: Target date: 09/25/2023  Pt will be independent with HEP  for improved strength, pain, transfers. Baseline: none; 10/01/23: compliant Goal status: MET  2.  Pt will improve 5x sit<>stand to less than or equal to 30 sec to demonstrate improved functional strength and transfer efficiency. Baseline: 37.25 sec Goal status: IN PROGRESS  LONG TERM GOALS: Target date: 10/23/2023  Pt will be independent with progression of HEP for improved pain, strength, transfers. Baseline:  Goal status: IN PROGRESS  2.  Pt will improve Modified Oswestry Score to less than or equal to 50%, to demo less pain interference in daily activities. Baseline: 64% Goal status: IN PROGRESS  3.  Pt will improve 5x sit<>stand to less than or equal to 20 sec to demonstrate improved functional strength and transfer efficiency. Baseline:  Goal status: IN PROGRESS  4.  Pt will rate pain in low back, decreased by at least 50%, for improved ADLs and gait activities. Baseline: 6-8/10 Goal status: IN PROGRESS  5. Patient to report tolerance for 30 minutes of standing/walking without device to return to PLOF.  Baseline: reports tolerating 30 min with RW Goal status: INITIAL  6.. Patient to demonstrate gait speed of at least 2.3 ft/sec in order to improve access to community.  Baseline: 1.9 ft/sec with RW Goal status: INITIAL    ASSESSMENT:  CLINICAL IMPRESSION: Pt is cleared for full R LE weight bearing. Able to initiate gross LE strengthening this session and updated pt's HEP accordingly. Working on decreasing R hip drop and increase weight bearing especially with standing to hopefully further decrease R side back pain with prolonged standing. Continued to work on improving gait to less restrictive a/d with cane.   OBJECTIVE IMPAIRMENTS: Abnormal gait, decreased balance, decreased mobility, difficulty walking, decreased strength, impaired flexibility, postural dysfunction, and pain.   ACTIVITY LIMITATIONS: sitting, standing, sleeping, transfers, bed mobility, and locomotion  level  PARTICIPATION LIMITATIONS: community activity  PERSONAL FACTORS: 3+ comorbidities: See PMH above are also affecting patient's functional outcome.   REHAB POTENTIAL: Good  CLINICAL DECISION MAKING: Evolving/moderate complexity  EVALUATION COMPLEXITY: Moderate  PLAN:  PT FREQUENCY: 1-2x/week  PT DURATION: 6 weeks  PLANNED INTERVENTIONS: 97164- PT Re-evaluation, 97750- Physical Performance Testing, 97110-Therapeutic exercises, 97530- Therapeutic activity, 97112- Neuromuscular re-education, 97535- Self Care, 02859- Manual therapy, [phone]- Gait training, [phone]- Canalith repositioning, [id number]- Aquatic Therapy, [phone]- Electrical stimulation (manual), [phone] (1-2 muscles), [id number] (3+ muscles)- Dry Needling, Patient/Family education, Balance training, Stair training, Taping, Joint mobilization, Spinal mobilization, Vestibular training, DME instructions, Cryotherapy, and Moist heat  PLAN FOR NEXT SESSION: Review HEP updates and progress for abdominal and lumbar strengthening; posture and positioning education   Keanthony Poole April Ma L Berdella Bacot, PT, DPT 10/01/23 1:01 PM  [facility] [address], [address] [place], [place] [address] Phone # [phone] Fax # [phone]

## 2023-10-02 LAB — BASIC METABOLIC PANEL WITH GFR
BUN/Creatinine Ratio: 21 (ref 12–28)
BUN: 18 mg/dL (ref 8–27)
CO2: 27 mmol/L (ref 20–29)
Calcium: 9.1 mg/dL (ref 8.7–10.3)
Chloride: 92 mmol/L — ABNORMAL LOW (ref 96–106)
Creatinine, Ser: 0.87 mg/dL (ref 0.57–1.00)
Glucose: 142 mg/dL — ABNORMAL HIGH (ref 70–99)
Potassium: 4.2 mmol/L (ref 3.5–5.2)
Sodium: 131 mmol/L — ABNORMAL LOW (ref 134–144)
eGFR: 69 mL/min/1.73 (ref >59)

## 2023-10-04 ENCOUNTER — Ambulatory Visit: Payer: Self-pay | Admitting: Cardiology

## 2023-10-04 DIAGNOSIS — I10 Essential (primary) hypertension: Secondary | ICD-10-CM

## 2023-10-05 NOTE — Therapy (Signed)
 OUTPATIENT PHYSICAL THERAPY NEURO    Patient Name: Wendy Jackson MRN: [id number] DOB:1945-12-01, 78 y.o., female Today's Date: 10/06/2023   PCP: Catherine Charlies LABOR, DO REFERRING PROVIDER: Persons, Ronal Dragon, [place]  (Additional referral from Jerri Kay HERO, MD for [id number] (ICD-10-CM) - Acute pain of right knee M54.50 (ICD-10-CM) - Acute bilateral low back pain without sciatica)    END OF SESSION:  PT End of Session - 10/06/23 1234     Visit Number 6    Number of Visits 13    Date for Recertification  10/23/23    Authorization Type BCBS medicare    Progress Note Due on Visit 10    PT Start Time 1148    PT Stop Time 1228    PT Time Calculation (min) 40 min    Equipment Utilized During Treatment Gait belt    Activity Tolerance Patient tolerated treatment well    Behavior During Therapy [facility] for tasks assessed/performed             Past Medical History:  Diagnosis Date   Cataract 03/2019   Glaucoma Approximately 2005   Horseshoe retinal tear of left eye 04/02/2021   Hyperlipidemia LDL goal <70 01/11/2021   Hypertension 2022   Long-term use of Plaquenil  04/02/2021   Lupus    new rheum questioning original dx. continuing Plaquenil  at lower dose for now.   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 bladder, acquired    Pseudophakia of both eyes 04/02/2021   Retinal detachment with multiple breaks, right eye 04/02/2021   Stroke (HCC) 04/2020   Thyroid  disease    Tibial plateau fracture, right 2025   Past Surgical History:  Procedure Laterality Date   ENDARTERECTOMY Right 05/02/2020   Procedure: RIGHT CAROTID ENDARTERECTOMY;  Surgeon: Oris Krystal FALCON, MD;  Location: [facility] OR;  Service: Vascular;  Laterality: Right;   EYE SURGERY  01/2020   LEFT HEART CATH AND CORONARY ANGIOGRAPHY N/A 02/03/2023   Procedure: LEFT HEART CATH AND CORONARY ANGIOGRAPHY;  Surgeon: Verlin Lonni BIRCH, MD;  Location: [facility] INVASIVE CV LAB;  Service: Cardiovascular;   Laterality: N/A;   SCLERAL BUCKLE Right 2004   TUBAL LIGATION  Feb 1982   Patient Active Problem List   Diagnosis Date Noted   Incontinence of feces with fecal urgency 05/05/2023   Gastroesophageal reflux disease with esophagitis without hemorrhage 05/05/2023   Bloating 05/05/2023   Other specified abnormal immunological findings in serum 04/03/2023   Elevated erythrocyte sedimentation rate 04/03/2023   High coronary artery calcium  score: 1525; 96th percentile 01/29/2023   SVT (supraventricular tachycardia) 12/31/2022   Other fatigue 12/31/2022   Decreased exercise tolerance 12/31/2022   Polymyalgia rheumatica 11/25/2022   Polyarthralgia 11/25/2022   Other secondary scoliosis, lumbar region 11/17/2022   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   Other female genital prolapse 10/29/2021   Long-term use of Plaquenil  04/02/2021   Primary open angle glaucoma (POAG) of both eyes, severe stage 04/02/2021   Primary hypertension 01/11/2021   Hyperlipidemia LDL goal <70 01/11/2021   Acquired thrombophilia 12/11/2020   BMI less than 19,adult 12/11/2020   Hypothyroidism 12/11/2020   History of CVA (cerebrovascular accident) 05/02/2020   Lupus    Allergic rhinitis    CAD (coronary artery disease) 05/20/2018    ONSET DATE: 09/04/2023 (MD referral)  REFERRING DIAG: M54.50 (ICD-10-CM) - Acute bilateral low back pain without sciatica  New referral in patient's chart from Dr. Jerri stating Right tibial plateau fracture. WBAT. Knee  rehab. Strengthening. Gait training.   THERAPY DIAG:  Other low back pain  Abnormal posture  Muscle weakness (generalized)  Acute pain of right knee  Difficulty in walking, not elsewhere classified  Unsteadiness on feet  Rationale for Evaluation and Treatment: Rehabilitation  SUBJECTIVE:                                                                                                                                                                                              SUBJECTIVE STATEMENT: Pt reports that after last session she was sore and her back has been hurting in the same spot at before.   Pt accompanied by: self and significant other  PERTINENT HISTORY: followed by Dr. Jerri for a lateral nondisplaced tibial plateau fracture.  She has been having to use a walker and bear minimal weight.  She does have a history of significant degenerative scoliosis and degenerative changes for which she had seen Dr. Barbarann in the past. Also hx of IBS, fecal leakage, has had pelvic floor therapy; polymalgia rheumatica   PAIN:  Are you having pain? Yes: NPRS scale: 6/10 Pain location: R low back Pain description: achy Aggravating factors: standing, sometimes sitting  Relieving factors: move to the L  PRECAUTIONS: Fall and Other: 10/01/23 pt reports she is now full weight bearing on R  RED FLAGS: None   WEIGHT BEARING RESTRICTIONS: None  FALLS: Has patient fallen in last 6 months? Yes. Number of falls 1; 3 falls in past 13 months (all have been tripping)  LIVING ENVIRONMENT: Lives with: lives with their spouse Lives in: House/apartment [place] Stairs: No Has following equipment at home: Single point cane, Environmental consultant - 2 wheeled, and single leg scooter (unable to use)  PLOF: Independent  PATIENT GOALS: To get back less tight and painful  OBJECTIVE:     TODAY'S TREATMENT: 10/06/23 Activity Comments  Palpation  TTP over R QL and lumbar paraspinals  L diagonal prayer stretch with pball 5x3 Good tolerance  LTR x20 Good tolerance   Open book stretch 5-10x each  More mobility towards R; pt reports feels good  5xSTS 24.95 sec with B UE support   STS from elevated mat, then low mat  Cueing for set up: scooting forward, tucking feet under, reach down to stand up. She reports less LBP using this technique   R LE step ups On 4 and 6 step weaning to 1 UE support; good stability and tolerance        PATIENT EDUCATION: Education details: edu on moist  heat to R LB for 10 min at a time, edu on self-massage using ball  on wall, handout on STS technique  Person educated: Patient Education method: Explanation, Demonstration, Tactile cues, Verbal cues, and Handouts Education comprehension: verbalized understanding and returned demonstration    Access Code: [email or web address] URL: [email or web address] Date: 10/01/2023 Prepared by: Gellen April Earnie Starring  Exercises - Supine Pelvic Tilt  - 1-2 x daily - 7 x weekly - 1-2 sets - 5-10 reps - Lower Trunk Rotations  - 1 x daily - 7 x weekly - 2 sets - 10 reps - Seated Flexion Stretch with Swiss Ball  - 1 x daily - 7 x weekly - 1 sets - 3-5 reps - Seated Thoracic Flexion and Rotation with Swiss Ball  - 1 x daily - 7 x weekly - 1 sets - 3-5 reps - 10-15 sec hold - Seated Pelvic Tilt  - 1 x daily - 7 x weekly - 2 sets - 5-10 reps - Supine Scapular Retraction  - 1 x daily - 7 x weekly - 2 sets - 10 reps - Seated Hamstring Stretch  - 1 x daily - 7 x weekly - 2 sets - 30 sec hold - Seated Active Straight-Leg Raise  - 1 x daily - 7 x weekly - 2 sets - 10 reps - 3 sec hold - Seated Hip Abduction with Resistance  - 1 x daily - 7 x weekly - 2 sets - 10 reps - 3 sec hold - Seated Hamstring Curls with Resistance  - 1 x daily - 7 x weekly - 2 sets - 10 reps - 3 sec hold - Standing Hip Extension with Counter Support  - 1 x daily - 7 x weekly - 2 sets - 10 reps - Standing Gluteal Sets  - 1 x daily - 7 x weekly - 2 sets - 10 reps - 3 sec hold - Standing Scapular Retraction  - 1 x daily - 7 x weekly - 3 sets - 10 reps   Note: Objective measures were completed at Evaluation unless otherwise noted.  DIAGNOSTIC FINDINGS: No acute findings per pt/husband report-curvature and severe arthritis  COGNITION: Overall cognitive status: Within functional limits for tasks assessed   SENSATION: Not tested  MUSCLE LENGTH: Hamstrings: Right NT deg; Left -30 deg   LOWER EXTREMITY MMT:     Active (in sitting)  Right 9/16 Left 9/16  Hip flexion 4+ 4  Hip extension    Hip abduction 4 4  Hip adduction 4+ 4+  Hip internal rotation    Hip external rotation    Knee flexion 4+ 4+  Knee extension 4+ 5  Ankle dorsiflexion 4 4  Ankle plantarflexion 4+ 4+  Ankle inversion    Ankle eversion     (Blank rows = not tested)  LOWER EXTREMITY ROM:    ROM Right 9/16 Left 9/16  Knee flexion 134 135  Knee extension 3 0  (Blank rows = not tested)  POSTURE: rounded shoulders, forward head, increased thoracic kyphosis, weight shift left, and L shoulder lower than R   PALPATION:  Tender along lumbar paraspinals bilaterally  BED MOBILITY:  Findings: Tends to go long sit<>supine with pain upon supine; instructed in log roll technique:  supine>L side>sit with min assist and cues  TRANSFERS: Sit to stand: Modified independence  Assistive device utilized: BUE support     Stand to sit: Modified independence  Assistive device utilized: BUE support      GAIT: Findings: Gait Characteristics: per MD note-minimal RLE weightbearing, step to pattern, step through pattern, and decreased stance time- Right,  Distance walked: 20 ft, Assistive device utilized:Walker - 2 wheeled, Level of assistance: SBA, and Comments: antalgic pattern  FUNCTIONAL TESTS:  5 times sit to stand: 37.25 sec with BUE support  PATIENT SURVEYS:  Modified Oswestry:  MODIFIED OSWESTRY DISABILITY SCALE  Date: 09/10/2023 Score  Pain intensity 4 =  Pain medication provides me with little relief from pain.  2. Personal care (washing, dressing, etc.) 2 =  It is painful to take care of myself, and I am slow and careful.  3. Lifting 5 =  I cannot lift or carry anything at all.  4. Walking 4 = I can only walk with crutches or a cane.  5. Sitting 3 =  Pain prevents me from sitting more than  hour.  6. Standing 3 =  Pain prevents me from standing more than 1/2 hour.  7. Sleeping 1 = I can sleep well only by using pain medication.  8. Social Life  5 =  I have hardly any social life because of my pain.  9. Traveling 2 =  My pain restricts my travel over 2 hours.  10. Employment/ Homemaking 3 = Pain prevents me from doing anything but light duties.  Total 32/50= 64%   Interpretation of scores: Score Category Description  0-20% Minimal Disability The patient can cope with most living activities. Usually no treatment is indicated apart from advice on lifting, sitting and exercise  21-40% Moderate Disability The patient experiences more pain and difficulty with sitting, lifting and standing. Travel and social life are more difficult and they may be disabled from work. Personal care, sexual activity and sleeping are not grossly affected, and the patient can usually be managed by conservative means  41-60% Severe Disability Pain remains the main problem in this group, but activities of daily living are affected. These patients require a detailed investigation  61-80% Crippled Back pain impinges on all aspects of the patient's life. Positive intervention is required  81-100% Bed-bound  These patients are either bed-bound or exaggerating their symptoms  Bluford FORBES Zoe DELENA Karon DELENA, et al. Surgery versus conservative management of stable thoracolumbar fracture: the PRESTO feasibility RCT. [place]): [employer]; 2021 Nov. [facility] Technology Assessment, No. 25.62.) Appendix 3, Oswestry Disability Index category descriptors. Available from: FindJewelers.cz  Minimally Clinically Important Difference (MCID) = 12.8%       GOALS: Goals reviewed with patient? Yes  SHORT TERM GOALS: Target date: 09/25/2023  Pt will be independent with HEP for improved strength, pain, transfers. Baseline: none; 10/01/23: compliant Goal status: MET  2.  Pt will improve 5x sit<>stand to less than or equal to 30 sec to demonstrate improved functional strength and transfer efficiency. Baseline: 37.25 sec; 24.95 sec with B  UE support 10/06/23 Goal status: MET 10/06/23  LONG TERM GOALS: Target date: 10/23/2023  Pt will be independent with progression of HEP for improved pain, strength, transfers. Baseline:  Goal status: IN PROGRESS  2.  Pt will improve Modified Oswestry Score to less than or equal to 50%, to demo less pain interference in daily activities. Baseline: 64% Goal status: IN PROGRESS  3.  Pt will improve 5x sit<>stand to less than or equal to 20 sec to demonstrate improved functional strength and transfer efficiency. Baseline:  Goal status: IN PROGRESS  4.  Pt will rate pain in low back, decreased by at least 50%, for improved ADLs and gait activities. Baseline: 6-8/10 Goal status: IN PROGRESS  5. Patient to report tolerance for 30 minutes of standing/walking  without device to return to PLOF.  Baseline: reports tolerating 30 min with RW Goal status: INITIAL  6.. Patient to demonstrate gait speed of at least 2.3 ft/sec in order to improve access to community.  Baseline: 1.9 ft/sec with RW Goal status: INITIAL    ASSESSMENT:  CLINICAL IMPRESSION: Patient arrived to session with report of flare of R sided LBP after last session. Worked on gentle stretching to address pain and educated on self-management techniques at home. Patient has met 5xSTS STG, however still relies on B UE use to stand from a chair. Worked on Manufacturing engineer, with patient demonstrating ability to stand from chair and reported less LB strain after practice. No complaints at end of session.   OBJECTIVE IMPAIRMENTS: Abnormal gait, decreased balance, decreased mobility, difficulty walking, decreased strength, impaired flexibility, postural dysfunction, and pain.   ACTIVITY LIMITATIONS: sitting, standing, sleeping, transfers, bed mobility, and locomotion level  PARTICIPATION LIMITATIONS: community activity  PERSONAL FACTORS: 3+ comorbidities: See PMH above are also affecting patient's functional outcome.   REHAB  POTENTIAL: Good  CLINICAL DECISION MAKING: Evolving/moderate complexity  EVALUATION COMPLEXITY: Moderate  PLAN:  PT FREQUENCY: 1-2x/week  PT DURATION: 6 weeks  PLANNED INTERVENTIONS: 97164- PT Re-evaluation, 97750- Physical Performance Testing, 97110-Therapeutic exercises, 97530- Therapeutic activity, 97112- Neuromuscular re-education, 97535- Self Care, [id number]- Manual therapy, [phone]- Gait training, [phone]- Canalith repositioning, [id number]- Aquatic Therapy, [phone]- Electrical stimulation (manual), [phone] (1-2 muscles), [id number] (3+ muscles)- Dry Needling, Patient/Family education, Balance training, Stair training, Taping, Joint mobilization, Spinal mobilization, Vestibular training, DME instructions, Cryotherapy, and Moist heat  PLAN FOR NEXT SESSION: Review HEP updates and progress for abdominal and lumbar strengthening; posture and positioning education; review STS technique without UEs    Louana Terrilyn Christians, PT, DPT 10/06/23 12:39 PM  [facility] [address] [place], [place] [address] Phone # [phone] Fax # [phone]

## 2023-10-06 ENCOUNTER — Encounter: Payer: Self-pay | Admitting: Physical Therapy

## 2023-10-06 ENCOUNTER — Ambulatory Visit: Admitting: Physical Therapy

## 2023-10-06 DIAGNOSIS — M6281 Muscle weakness (generalized): Secondary | ICD-10-CM

## 2023-10-06 DIAGNOSIS — R262 Difficulty in walking, not elsewhere classified: Secondary | ICD-10-CM

## 2023-10-06 DIAGNOSIS — R2681 Unsteadiness on feet: Secondary | ICD-10-CM

## 2023-10-06 DIAGNOSIS — M25561 Pain in right knee: Secondary | ICD-10-CM | POA: Diagnosis not present

## 2023-10-06 DIAGNOSIS — M5459 Other low back pain: Secondary | ICD-10-CM

## 2023-10-06 DIAGNOSIS — R293 Abnormal posture: Secondary | ICD-10-CM

## 2023-10-06 NOTE — Patient Instructions (Signed)
 To stand up from a chair without hands: Scoot forward to the edge of the seat Tuck feet underneath you Reach down to stand up

## 2023-10-07 DIAGNOSIS — K080 Exfoliation of teeth due to systemic causes: Secondary | ICD-10-CM | POA: Diagnosis not present

## 2023-10-08 ENCOUNTER — Ambulatory Visit: Attending: Cardiology | Admitting: Pharmacist

## 2023-10-08 ENCOUNTER — Encounter: Payer: Self-pay | Admitting: Pharmacist

## 2023-10-08 ENCOUNTER — Encounter: Payer: Self-pay | Admitting: Physical Therapy

## 2023-10-08 ENCOUNTER — Ambulatory Visit: Admitting: Physical Therapy

## 2023-10-08 VITALS — BP 132/60

## 2023-10-08 DIAGNOSIS — I10 Essential (primary) hypertension: Secondary | ICD-10-CM

## 2023-10-08 DIAGNOSIS — M5459 Other low back pain: Secondary | ICD-10-CM

## 2023-10-08 DIAGNOSIS — R293 Abnormal posture: Secondary | ICD-10-CM | POA: Diagnosis not present

## 2023-10-08 DIAGNOSIS — R262 Difficulty in walking, not elsewhere classified: Secondary | ICD-10-CM | POA: Diagnosis not present

## 2023-10-08 DIAGNOSIS — M6281 Muscle weakness (generalized): Secondary | ICD-10-CM

## 2023-10-08 NOTE — Progress Notes (Signed)
 Patient ID: Wendy Jackson                 DOB: 1945-04-14                      MRN: [id number]     HPI: Wendy Jackson is a 78 y.o. female referred by Dr. Lavona to HTN clinic. PMH is significant for CAD, HTN, SVT, PMR, and history of CVA.  Patient presents today with husband for recheck. Valsartan  80mg  in the morning added to patient's regimen along with valsartan   160mg  in evening, verapamil  120mg  daily, and hydrochlorothiazide  25mg  in morning.  Blood pressure began improving until patient fell on cement and broke her kneecap. Currently being followed [facility].  Given a walker to elevate her leg, but this ended up giving her back pain.  Now uses a regular walker.  Has a long history of white coat HTN. Intended to follow up with patient over phone but she and husband came in due to having a PT appt this morning also.  Brought home log and readings have improved. 9/24: 131/66, 132/60 9/23: 153/70, 133/64 9/22: 132/65, 141/62 9/21: 130/65, 157/73 9/19: 143/76, 124/71 9/18: 148/69, 125/62   Current HTN meds:  Valsartan  160mg  twice daily Hydrochlorothiazide  25mg  daily Verapamil  120mg  daily   Previously tried: Amlodipine  (edema) BP goal: <130/80  Wt Readings from Last 3 Encounters:  07/16/23 89 lb (40.4 kg)  07/01/23 89 lb 9.6 oz (40.6 kg)  05/12/23 88 lb 12.8 oz (40.3 kg)   BP Readings from Last 3 Encounters:  07/16/23 114/72  07/01/23 (!) 190/70  06/23/23 124/84   Pulse Readings from Last 3 Encounters:  07/16/23 66  07/01/23 60  06/23/23 69    Renal function: CrCl cannot be calculated (Patient's most recent lab result is older than the maximum 21 days allowed.).  Past Medical History:  Diagnosis Date   Cataract 03/2019   Glaucoma Approximately 2005   Horseshoe retinal tear of left eye 04/02/2021   Hyperlipidemia LDL goal <70 01/11/2021   Hypertension 2022   Long-term use of Plaquenil  04/02/2021   Lupus    new rheum questioning original dx. continuing  Plaquenil  at lower dose for now.   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 bladder, acquired    Pseudophakia of both eyes 04/02/2021   Retinal detachment with multiple breaks, right eye 04/02/2021   Stroke (HCC) 04/2020   Thyroid  disease     Current Outpatient Medications on File Prior to Visit  Medication Sig Dispense Refill   ARMOUR THYROID  30 MG tablet Take 1 tablet (30 mg total) by mouth daily. 90 tablet 4   aspirin  EC 81 MG tablet Take 1 tablet (81 mg total) by mouth daily. Swallow whole. 30 tablet 11   cetirizine (ZYRTEC) 10 MG tablet Take 10 mg by mouth See admin instructions. Every 36 hours     estradiol  (ESTRACE ) 0.1 MG/GM vaginal cream Use 1/2 gram three times per week at night. 42.5 g 0   furosemide  (LASIX ) 40 MG tablet Take 40 mg by mouth daily as needed for fluid or edema.     hydrochlorothiazide  (HYDRODIURIL ) 25 MG tablet Take 1 tablet (25 mg total) by mouth daily. 90 tablet 3   hydroxychloroquine  (PLAQUENIL ) 200 MG tablet Take 200 mg by mouth every other day.     latanoprost (XALATAN) 0.005 % ophthalmic solution Place 1 drop into the left eye at bedtime.  Misc Natural Products (AIRBORNE ELDERBERRY) CHEW Chew 1 tablet by mouth in the morning and at bedtime.     pantoprazole  (PROTONIX ) 40 MG tablet Take 1 tablet (40 mg total) by mouth daily. 90 tablet 3   Probiotic Product (UP4 PROBIOTICS WOMENS PO) Take 1 capsule by mouth daily.     Resveratrol 50 MG CAPS Take 50 mg by mouth daily.     rosuvastatin  (CRESTOR ) 40 MG tablet Take 1 tablet (40 mg total) by mouth daily. 90 tablet 3   SIMBRINZA 1-0.2 % SUSP Place 1 drop into the left eye 3 (three) times daily.     Sodium Chloride -Xylitol (XLEAR SINUS CARE SPRAY NA) Place 4 sprays into the nose daily as needed (Moisturing).     Ubiquinol 100 MG CAPS Take 100 mg by mouth.     valsartan  (DIOVAN ) 160 MG tablet TAKE 1 TABLET BY MOUTH EVERY EVENING 30 tablet 2   verapamil  (CALAN -SR) 120 MG CR  tablet Take 1 tablet (120 mg total) by mouth at bedtime. 90 tablet 1   No current facility-administered medications on file prior to visit.    Allergies  Allergen Reactions   Amlodipine  Other (See Comments)    Bilateral lower extremity edema   Augmentin [Amoxicillin-Pot Clavulanate] Other (See Comments)    unk   Tramadol     Nausea    Flexeril [Cyclobenzaprine] Palpitations     Assessment/Plan:  1. Hypertension - Patient BP elevated in room which is typical for her. Home readings look much improved with many readings only slightly above goal of <130/80. Concern that if meds continue to be titrated her BP will drop further and she is a fall risk. Will continue current regimen and call patient in 1 month for f/u. Will recheck BMP in 2-3 weeks due to hyponatremia.  Continue valsartan  160mg  BID Continue hydrochlorothiazide  25mg  in morning Continue verapamil  120mg  in evening Recheck BMP F/u over phone in 4 weeks  Medford Bolk, PharmD, BCACP, CDCES, CPP [facility] [address] [place], [place] [address] Phone: [phone]; Fax: [phone] 08/05/2023 9:29 AM

## 2023-10-08 NOTE — Therapy (Signed)
 OUTPATIENT PHYSICAL THERAPY NEURO TREATMENT NOTE   Patient Name: Wendy Jackson MRN: [id number] DOB:24-Aug-1945, 78 y.o., female Today's Date: 10/08/2023   PCP: Catherine Charlies LABOR, DO REFERRING PROVIDER: Persons, Ronal Dragon, [place]  (Additional referral from Jerri Kay HERO, MD for [phone] (ICD-10-CM) - Acute pain of right knee M54.50 (ICD-10-CM) - Acute bilateral low back pain without sciatica)    END OF SESSION:  PT End of Session - 10/08/23 0926     Visit Number 7    Number of Visits 13    Date for Recertification  10/23/23    Authorization Type BCBS medicare    Progress Note Due on Visit 10    PT Start Time 0930    PT Stop Time 1014    PT Time Calculation (min) 44 min    Equipment Utilized During Treatment Gait belt    Activity Tolerance Patient tolerated treatment well    Behavior During Therapy [facility] for tasks assessed/performed              Past Medical History:  Diagnosis Date   Cataract 03/2019   Glaucoma Approximately 2005   [place] retinal tear of left eye 04/02/2021   Hyperlipidemia LDL goal <70 01/11/2021   Hypertension 2022   Long-term use of Plaquenil  04/02/2021   Lupus    new rheum questioning original dx. continuing Plaquenil  at lower dose for now.   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 bladder, acquired    Pseudophakia of both eyes 04/02/2021   Retinal detachment with multiple breaks, right eye 04/02/2021   Stroke (HCC) 04/2020   Thyroid  disease    Tibial plateau fracture, right 2025   Past Surgical History:  Procedure Laterality Date   ENDARTERECTOMY Right 05/02/2020   Procedure: RIGHT CAROTID ENDARTERECTOMY;  Surgeon: Oris Krystal FALCON, MD;  Location: [facility] OR;  Service: Vascular;  Laterality: Right;   EYE SURGERY  01/2020   LEFT HEART CATH AND CORONARY ANGIOGRAPHY N/A 02/03/2023   Procedure: LEFT HEART CATH AND CORONARY ANGIOGRAPHY;  Surgeon: Verlin Lonni BIRCH, MD;  Location: [facility] INVASIVE CV LAB;  Service:  Cardiovascular;  Laterality: N/A;   SCLERAL BUCKLE Right 2004   TUBAL LIGATION  Feb 1982   Patient Active Problem List   Diagnosis Date Noted   Incontinence of feces with fecal urgency 05/05/2023   Gastroesophageal reflux disease with esophagitis without hemorrhage 05/05/2023   Bloating 05/05/2023   Other specified abnormal immunological findings in serum 04/03/2023   Elevated erythrocyte sedimentation rate 04/03/2023   High coronary artery calcium  score: 1525; 96th percentile 01/29/2023   SVT (supraventricular tachycardia) 12/31/2022   Other fatigue 12/31/2022   Decreased exercise tolerance 12/31/2022   Polymyalgia rheumatica 11/25/2022   Polyarthralgia 11/25/2022   Other secondary scoliosis, lumbar region 11/17/2022   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   Other female genital prolapse 10/29/2021   Long-term use of Plaquenil  04/02/2021   Primary open angle glaucoma (POAG) of both eyes, severe stage 04/02/2021   Primary hypertension 01/11/2021   Hyperlipidemia LDL goal <70 01/11/2021   Acquired thrombophilia 12/11/2020   BMI less than 19,adult 12/11/2020   Hypothyroidism 12/11/2020   History of CVA (cerebrovascular accident) 05/02/2020   Lupus    Allergic rhinitis    CAD (coronary artery disease) 05/20/2018    ONSET DATE: 09/04/2023 (MD referral)  REFERRING DIAG: M54.50 (ICD-10-CM) - Acute bilateral low back pain without sciatica  New referral in patient's chart from Dr. Jerri stating Right tibial plateau fracture.  WBAT. Knee rehab. Strengthening. Gait training.   THERAPY DIAG:  Other low back pain  Abnormal posture  Muscle weakness (generalized)  Rationale for Evaluation and Treatment: Rehabilitation  SUBJECTIVE:                                                                                                                                                                                             SUBJECTIVE STATEMENT: My back is as sore as it ever was.  Can put as much  weight as needed on knee, but still having difficulty with walking.  Pt accompanied by: self and significant other  PERTINENT HISTORY: followed by Dr. Jerri for a lateral nondisplaced tibial plateau fracture.  She has been having to use a walker and bear minimal weight.  She does have a history of significant degenerative scoliosis and degenerative changes for which she had seen Dr. Barbarann in the past. Also hx of IBS, fecal leakage, has had pelvic floor therapy; polymalgia rheumatica   PAIN:  Are you having pain? Yes: NPRS scale: 6/10 Pain location: R low back Pain description: achy Aggravating factors: standing, sometimes sitting  Relieving factors: move to the L  PRECAUTIONS: Fall and Other: 10/01/23 pt reports she is now full weight bearing on R  RED FLAGS: None   WEIGHT BEARING RESTRICTIONS: None  FALLS: Has patient fallen in last 6 months? Yes. Number of falls 1; 3 falls in past 13 months (all have been tripping)  LIVING ENVIRONMENT: Lives with: lives with their spouse Lives in: House/apartment [place] Stairs: No Has following equipment at home: Single point cane, Environmental consultant - 2 wheeled, and single leg scooter (unable to use)  PLOF: Independent  PATIENT GOALS: To get back less tight and painful  OBJECTIVE:       TODAY'S TREATMENT: 10/08/23 Activity Comments  Palpation  TTP over R QL and lumbar paraspinals  Verbal review of initial HEP (to continue doing at home) -supine and seated pelvic tilt -supine scapular squeeze and trunk rotation These do not bring on pain  L diagonal prayer stretch with pball 5x3 Good tolerance  Seated core stability exercises: -Alt UE lifts x 5, BUE lifts x 5, BUE gentle rotation x 5 -Scapular squeezes no resistance, then yellow band, 10 each -seated march 2 x 5 -paloff press, yellow band, 2 x 5 reps Cues for abdominal activation No c/o pain        STS from elevated mat, then low mat    STS at chair, 3 reps Cueing for set up: scooting  forward, tucking feet under, reach down to stand up. She reports less LBP using this technique  Pain with reaching back to chair  Standing glut sets x 10 Standing hip extension  Pain with hip extension  Reviewed/performed seated exercises: LAQ x 10 Seated hamstring stretch   No pain (replaced seated SLR)  Gait with RW and Gait with no device 50 ft x 2 Cues to lessen UE support at RW, cues to activate abdominals    PATIENT EDUCATION: Education details: Continue to use moist heat to R LB for 10 min at a time, edu on self-massage using ball on wall, reviewed STS technique; modified HEP/took away leg exercises that were increaseing pain Person educated: Patient Education method: Explanation, Demonstration, Tactile cues, Verbal cues, and Handouts Education comprehension: verbalized understanding and returned demonstration  Access Code: [email or web address] URL: [email or web address] Date: 10/08/2023 Prepared by: [facility] - Outpatient  Rehab - [facility]  Exercises - Supine Pelvic Tilt  - 1-2 x daily - 7 x weekly - 1-2 sets - 5-10 reps - Lower Trunk Rotations  - 1 x daily - 7 x weekly - 2 sets - 10 reps - Seated Flexion Stretch with Swiss Ball  - 1 x daily - 7 x weekly - 1 sets - 3-5 reps - Seated Thoracic Flexion and Rotation with Swiss Ball  - 1 x daily - 7 x weekly - 1 sets - 3-5 reps - 10-15 sec hold - Seated Pelvic Tilt  - 1 x daily - 7 x weekly - 2 sets - 5-10 reps - Supine Scapular Retraction  - 1 x daily - 7 x weekly - 2 sets - 10 reps - Seated Hamstring Stretch  - 1 x daily - 7 x weekly - 2 sets - 30 sec hold - Standing Gluteal Sets  - 1 x daily - 7 x weekly - 2 sets - 10 reps - 3 sec hold - Standing Scapular Retraction  - 1 x daily - 7 x weekly - 3 sets - 10 reps - Seated Long Arc Quad  - 1 x daily - 7 x weekly - 2 sets - 10 reps      Note: Objective measures were completed at Evaluation unless otherwise noted.  DIAGNOSTIC FINDINGS: No acute findings per  pt/husband report-curvature and severe arthritis  COGNITION: Overall cognitive status: Within functional limits for tasks assessed   SENSATION: Not tested  MUSCLE LENGTH: Hamstrings: Right NT deg; Left -30 deg   LOWER EXTREMITY MMT:     Active (in sitting) Right 9/16 Left 9/16  Hip flexion 4+ 4  Hip extension    Hip abduction 4 4  Hip adduction 4+ 4+  Hip internal rotation    Hip external rotation    Knee flexion 4+ 4+  Knee extension 4+ 5  Ankle dorsiflexion 4 4  Ankle plantarflexion 4+ 4+  Ankle inversion    Ankle eversion     (Blank rows = not tested)  LOWER EXTREMITY ROM:    ROM Right 9/16 Left 9/16  Knee flexion 134 135  Knee extension 3 0  (Blank rows = not tested)  POSTURE: rounded shoulders, forward head, increased thoracic kyphosis, weight shift left, and L shoulder lower than R   PALPATION:  Tender along lumbar paraspinals bilaterally  BED MOBILITY:  Findings: Tends to go long sit<>supine with pain upon supine; instructed in log roll technique:  supine>L side>sit with min assist and cues  TRANSFERS: Sit to stand: Modified independence  Assistive device utilized: BUE support     Stand to sit: Modified independence  Assistive device utilized: BUE support  GAIT: Findings: Gait Characteristics: per MD note-minimal RLE weightbearing, step to pattern, step through pattern, and decreased stance time- Right, Distance walked: 20 ft, Assistive device utilized:Walker - 2 wheeled, Level of assistance: SBA, and Comments: antalgic pattern  FUNCTIONAL TESTS:  5 times sit to stand: 37.25 sec with BUE support  PATIENT SURVEYS:  Modified Oswestry:  MODIFIED OSWESTRY DISABILITY SCALE  Date: 09/10/2023 Score  Pain intensity 4 =  Pain medication provides me with little relief from pain.  2. Personal care (washing, dressing, etc.) 2 =  It is painful to take care of myself, and I am slow and careful.  3. Lifting 5 =  I cannot lift or carry anything at all.  4.  Walking 4 = I can only walk with crutches or a cane.  5. Sitting 3 =  Pain prevents me from sitting more than  hour.  6. Standing 3 =  Pain prevents me from standing more than 1/2 hour.  7. Sleeping 1 = I can sleep well only by using pain medication.  8. Social Life 5 =  I have hardly any social life because of my pain.  9. Traveling 2 =  My pain restricts my travel over 2 hours.  10. Employment/ Homemaking 3 = Pain prevents me from doing anything but light duties.  Total 32/50= 64%   Interpretation of scores: Score Category Description  0-20% Minimal Disability The patient can cope with most living activities. Usually no treatment is indicated apart from advice on lifting, sitting and exercise  21-40% Moderate Disability The patient experiences more pain and difficulty with sitting, lifting and standing. Travel and social life are more difficult and they may be disabled from work. Personal care, sexual activity and sleeping are not grossly affected, and the patient can usually be managed by conservative means  41-60% Severe Disability Pain remains the main problem in this group, but activities of daily living are affected. These patients require a detailed investigation  61-80% Crippled Back pain impinges on all aspects of the patient's life. Positive intervention is required  81-100% Bed-bound  These patients are either bed-bound or exaggerating their symptoms  Bluford FORBES Zoe DELENA Karon DELENA, et al. Surgery versus conservative management of stable thoracolumbar fracture: the PRESTO feasibility RCT. [place]): [employer]; 2021 Nov. [facility] Technology Assessment, No. 25.62.) Appendix 3, Oswestry Disability Index category descriptors. Available from: FindJewelers.cz  Minimally Clinically Important Difference (MCID) = 12.8%       GOALS: Goals reviewed with patient? Yes  SHORT TERM GOALS: Target date: 09/25/2023  Pt will be independent with HEP  for improved strength, pain, transfers. Baseline: none; 10/01/23: compliant Goal status: MET  2.  Pt will improve 5x sit<>stand to less than or equal to 30 sec to demonstrate improved functional strength and transfer efficiency. Baseline: 37.25 sec; 24.95 sec with B UE support 10/06/23 Goal status: MET 10/06/23  LONG TERM GOALS: Target date: 10/23/2023  Pt will be independent with progression of HEP for improved pain, strength, transfers. Baseline:  Goal status: IN PROGRESS  2.  Pt will improve Modified Oswestry Score to less than or equal to 50%, to demo less pain interference in daily activities. Baseline: 64% Goal status: IN PROGRESS  3.  Pt will improve 5x sit<>stand to less than or equal to 20 sec to demonstrate improved functional strength and transfer efficiency. Baseline:  Goal status: IN PROGRESS  4.  Pt will rate pain in low back, decreased by at least 50%, for improved ADLs  and gait activities. Baseline: 6-8/10 Goal status: IN PROGRESS  5. Patient to report tolerance for 30 minutes of standing/walking without device to return to PLOF.  Baseline: reports tolerating 30 min with RW Goal status: INITIAL  6.. Patient to demonstrate gait speed of at least 2.3 ft/sec in order to improve access to community.  Baseline: 1.9 ft/sec with RW Goal status: INITIAL    ASSESSMENT:  CLINICAL IMPRESSION: Pt presents today and reports that back is continuing to bother her. Skilled PT session focused on reviewing/highlighting exercises that are not exacerbating low back pain and modifying HEP to avoid some of the seated and standing exercises that might be causing lumbar strain.  She is able to perform exercises in PT session today with less complaints of pain.  The only thing that brings on pain is eccentric control to sitting at chair today.  See modified HEP for seated and supine exercises for now to lessen lumbar stress while she is focusing more on abdominal activation.  Pt will  continue to benefit from skilled PT towards goals for improved functional mobility and decreased fall risk.  OBJECTIVE IMPAIRMENTS: Abnormal gait, decreased balance, decreased mobility, difficulty walking, decreased strength, impaired flexibility, postural dysfunction, and pain.   ACTIVITY LIMITATIONS: sitting, standing, sleeping, transfers, bed mobility, and locomotion level  PARTICIPATION LIMITATIONS: community activity  PERSONAL FACTORS: 3+ comorbidities: See PMH above are also affecting patient's functional outcome.   REHAB POTENTIAL: Good  CLINICAL DECISION MAKING: Evolving/moderate complexity  EVALUATION COMPLEXITY: Moderate  PLAN:  PT FREQUENCY: 1-2x/week  PT DURATION: 6 weeks  PLANNED INTERVENTIONS: 97164- PT Re-evaluation, 97750- Physical Performance Testing, 97110-Therapeutic exercises, 97530- Therapeutic activity, [id number]- Neuromuscular re-education, 97535- Self Care, [id number]- Manual therapy, [id number]- Gait training, [phone]- Canalith repositioning, [id number]- Aquatic Therapy, [phone]- Electrical stimulation (manual), [phone] (1-2 muscles), [id number] (3+ muscles)- Dry Needling, Patient/Family education, Balance training, Stair training, Taping, Joint mobilization, Spinal mobilization, Vestibular training, DME instructions, Cryotherapy, and Moist heat  PLAN FOR NEXT SESSION: Review HEP updates and progress for abdominal and lumbar strengthening; review STS technique without Ues.  Gait training and BLE strenghtening    Greig Anon, PT 10/08/23 12:34 PM Phone: [phone] Fax: [phone]  [facility] at [facility] [address] [place], [place] [address] Phone # [phone] Fax # [phone]

## 2023-10-08 NOTE — Patient Instructions (Addendum)
 Good seeing you again  Your home blood pressure readings look much better!  Please continue: Valsartan  160mg  twice a day Hydrochlorothiazide  25mg  in the morning Verapamil  120mg  in the evening  Your repeat lab order has been placed so you can have that drawn any day   Please let us  know if you have any questions  Medford Bolk, PharmD, BCACP, CDCES, CPP [facility] [address], [place], [place] [address] Phone: [phone]; Fax: [phone] 10/08/2023 11:23 AM

## 2023-10-12 ENCOUNTER — Ambulatory Visit: Admitting: Physical Therapy

## 2023-10-13 ENCOUNTER — Ambulatory Visit: Payer: Medicare Other | Admitting: Dermatology

## 2023-10-14 ENCOUNTER — Ambulatory Visit

## 2023-10-19 ENCOUNTER — Ambulatory Visit

## 2023-10-20 ENCOUNTER — Encounter: Payer: Self-pay | Admitting: Physical Therapy

## 2023-10-20 ENCOUNTER — Ambulatory Visit: Attending: Physician Assistant | Admitting: Physical Therapy

## 2023-10-20 DIAGNOSIS — R262 Difficulty in walking, not elsewhere classified: Secondary | ICD-10-CM | POA: Diagnosis not present

## 2023-10-20 DIAGNOSIS — R2681 Unsteadiness on feet: Secondary | ICD-10-CM | POA: Insufficient documentation

## 2023-10-20 DIAGNOSIS — M5459 Other low back pain: Secondary | ICD-10-CM | POA: Diagnosis not present

## 2023-10-20 DIAGNOSIS — M25561 Pain in right knee: Secondary | ICD-10-CM | POA: Diagnosis not present

## 2023-10-20 DIAGNOSIS — M6281 Muscle weakness (generalized): Secondary | ICD-10-CM | POA: Diagnosis not present

## 2023-10-20 DIAGNOSIS — R293 Abnormal posture: Secondary | ICD-10-CM | POA: Diagnosis not present

## 2023-10-20 NOTE — Therapy (Signed)
 OUTPATIENT PHYSICAL THERAPY NEURO TREATMENT NOTE   Patient Name: Wendy Jackson MRN: [id number] DOB:12-13-1945, 78 y.o., female Today's Date: 10/20/2023   PCP: Catherine Charlies LABOR, DO REFERRING PROVIDER: Persons, Ronal Dragon, [place]  (Additional referral from Jerri Kay HERO, MD for [phone] (ICD-10-CM) - Acute pain of right knee M54.50 (ICD-10-CM) - Acute bilateral low back pain without sciatica)    END OF SESSION:  PT End of Session - 10/20/23 1224     Visit Number 8    Number of Visits 13    Date for Recertification  10/23/23    Authorization Type [employer]BS medicare    Progress Note Due on Visit 10    PT Start Time 1230    PT Stop Time 1314    PT Time Calculation (min) 44 min    Equipment Utilized During Treatment Gait belt    Activity Tolerance Patient tolerated treatment well    Behavior During Therapy WFL for tasks assessed/performed              Past Medical History:  Diagnosis Date   Cataract 03/2019   Glaucoma Approximately 2005   [place] retinal tear of left eye 04/02/2021   Hyperlipidemia LDL goal <70 01/11/2021   Hypertension 2022   Long-term use of Plaquenil  04/02/2021   Lupus    new rheum questioning original dx. continuing Plaquenil  at lower dose for now.   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 bladder, acquired    Pseudophakia of both eyes 04/02/2021   Retinal detachment with multiple breaks, right eye 04/02/2021   Stroke (HCC) 04/2020   Thyroid  disease    Tibial plateau fracture, right 2025   Past Surgical History:  Procedure Laterality Date   ENDARTERECTOMY Right 05/02/2020   Procedure: RIGHT CAROTID ENDARTERECTOMY;  Surgeon: Oris Krystal FALCON, MD;  Location: [facility] OR;  Service: Vascular;  Laterality: Right;   EYE SURGERY  01/2020   LEFT HEART CATH AND CORONARY ANGIOGRAPHY N/A 02/03/2023   Procedure: LEFT HEART CATH AND CORONARY ANGIOGRAPHY;  Surgeon: Verlin Lonni BIRCH, MD;  Location: [facility] INVASIVE CV LAB;  Service:  Cardiovascular;  Laterality: N/A;   SCLERAL BUCKLE Right 2004   TUBAL LIGATION  Feb 1982   Patient Active Problem List   Diagnosis Date Noted   Incontinence of feces with fecal urgency 05/05/2023   Gastroesophageal reflux disease with esophagitis without hemorrhage 05/05/2023   Bloating 05/05/2023   Other specified abnormal immunological findings in serum 04/03/2023   Elevated erythrocyte sedimentation rate 04/03/2023   High coronary artery calcium  score: 1525; 96th percentile 01/29/2023   SVT (supraventricular tachycardia) 12/31/2022   Other fatigue 12/31/2022   Decreased exercise tolerance 12/31/2022   Polymyalgia rheumatica 11/25/2022   Polyarthralgia 11/25/2022   Other secondary scoliosis, lumbar region 11/17/2022   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   Other female genital prolapse 10/29/2021   Long-term use of Plaquenil  04/02/2021   Primary open angle glaucoma (POAG) of both eyes, severe stage 04/02/2021   Primary hypertension 01/11/2021   Hyperlipidemia LDL goal <70 01/11/2021   Acquired thrombophilia 12/11/2020   BMI less than 19,adult 12/11/2020   Hypothyroidism 12/11/2020   History of CVA (cerebrovascular accident) 05/02/2020   Lupus    Allergic rhinitis    CAD (coronary artery disease) 05/20/2018    ONSET DATE: 09/04/2023 (MD referral)  REFERRING DIAG: M54.50 (ICD-10-CM) - Acute bilateral low back pain without sciatica  New referral in patient's chart from Dr. Jerri stating Right tibial plateau fracture.  WBAT. Knee rehab. Strengthening. Gait training.   THERAPY DIAG:  Other low back pain  Abnormal posture  Muscle weakness (generalized)  Acute pain of right knee  Rationale for Evaluation and Treatment: Rehabilitation  SUBJECTIVE:                                                                                                                                                                                             SUBJECTIVE STATEMENT: The back seems a little  better, and I used [place] cane [place].  Pt accompanied by: self and significant other  PERTINENT HISTORY: followed by Dr. Jerri for a lateral nondisplaced tibial plateau fracture.  She has been having to use a walker and bear minimal weight.  She does have a history of significant degenerative scoliosis and degenerative changes for which she had seen Dr. Barbarann in the past. Also hx of IBS, fecal leakage, has had pelvic floor therapy; polymalgia rheumatica   PAIN:  Are you having pain? No  PRECAUTIONS: Fall and Other: 10/01/23 pt reports she is now full weight bearing on R  RED FLAGS: None   WEIGHT BEARING RESTRICTIONS: None  FALLS: Has patient fallen in last 6 months? Yes. Number of falls 1; 3 falls in past 13 months (all have been tripping)  LIVING ENVIRONMENT: Lives with: lives with their spouse Lives in: House/apartment [place] Stairs: No Has following equipment at home: Single point cane, Walker - 2 wheeled, and single leg scooter (unable to use)  PLOF: Independent  PATIENT GOALS: To get back less tight and painful  OBJECTIVE:    Been doing my exercises.  TODAY'S TREATMENT: 10/20/2023 Activity Comments  Gait with SPC 360 ft, CGA No LOB, mild lean towards L  Forward/back walk in parallel bars x 5 reps 1 UE support  Sidestepping at parallel bars 1 UE support  Sit to stand x 5 reps 2 sets, hands on knees; initial cues for forward lean  Alt step taps at 4 step, 2 x 10 reps BUE>1 UE support  Forward step ups to 4 step x 10, then 6 step x 5  1 UE support, good form  Short distance gait between activities, with cane, CGA      Access Code: [email or web address] URL: [email or web address] Date: 10/20/2023 Prepared by: [facility] - Outpatient  Rehab - [facility]  Exercises - Supine Pelvic Tilt  - 1-2 x daily - 7 x weekly - 1-2 sets - 5-10 reps - Lower Trunk Rotations  - 1 x daily - 7 x weekly - 2 sets - 10 reps - Seated  Flexion Stretch with Swiss Ball  - 1 x daily - 7 x weekly - 1 sets - 3-5 reps - Seated Thoracic Flexion and Rotation with Swiss Ball  - 1 x daily - 7 x weekly - 1 sets - 3-5 reps - 10-15 sec hold - Seated Pelvic Tilt  - 1 x daily - 7 x weekly - 2 sets - 5-10 reps - Supine Scapular Retraction  - 1 x daily - 7 x weekly - 2 sets - 10 reps - Seated Hamstring Stretch  - 1 x daily - 7 x weekly - 2 sets - 30 sec hold - Standing Gluteal Sets  - 1 x daily - 7 x weekly - 2 sets - 10 reps - 3 sec hold - Standing Scapular Retraction  - 1 x daily - 7 x weekly - 3 sets - 10 reps - Seated Long Arc Quad  - 1 x daily - 7 x weekly - 2 sets - 10 reps - Backward Walking with Counter Support  - 1 x daily - 7 x weekly - 1 sets - 3-5 reps - Side Stepping with Counter Support  - 1 x daily - 7 x weekly - 1 sets - 3-5 reps - Forward Step Up  - 1 x daily - 7 x weekly - 3 sets - 5 reps   PATIENT EDUCATION: Education details: Updates to HEP Person educated: Patient Education method: Explanation, Demonstration, Tactile cues, Verbal cues, and Handouts Education comprehension: verbalized understanding and returned demonstration     Note: Objective measures were completed at Evaluation unless otherwise noted.  DIAGNOSTIC FINDINGS: No acute findings per pt/husband report-curvature and severe arthritis  COGNITION: Overall cognitive status: Within functional limits for tasks assessed   SENSATION: Not tested  MUSCLE LENGTH: Hamstrings: Right NT deg; Left -30 deg   LOWER EXTREMITY MMT:     Active (in sitting) Right 9/16 Left 9/16  Hip flexion 4+ 4  Hip extension    Hip abduction 4 4  Hip adduction 4+ 4+  Hip internal rotation    Hip external rotation    Knee flexion 4+ 4+  Knee extension 4+ 5  Ankle dorsiflexion 4 4  Ankle plantarflexion 4+ 4+  Ankle inversion    Ankle eversion     (Blank rows = not tested)  LOWER EXTREMITY ROM:    ROM Right 9/16 Left 9/16  Knee flexion 134 135  Knee extension  3 0  (Blank rows = not tested)  POSTURE: rounded shoulders, forward head, increased thoracic kyphosis, weight shift left, and L shoulder lower than R   PALPATION:  Tender along lumbar paraspinals bilaterally  BED MOBILITY:  Findings: Tends to go long sit<>supine with pain upon supine; instructed in log roll technique:  supine>L side>sit with min assist and cues  TRANSFERS: Sit to stand: Modified independence  Assistive device utilized: BUE support     Stand to sit: Modified independence  Assistive device utilized: BUE support      GAIT: Findings: Gait Characteristics: per MD note-minimal RLE weightbearing, step to pattern, step through pattern, and decreased stance time- Right, Distance walked: 20 ft, Assistive device utilized:Walker - 2 wheeled, Level of assistance: SBA, and Comments: antalgic pattern  FUNCTIONAL TESTS:  5 times sit to stand: 37.25 sec with BUE support  PATIENT SURVEYS:  Modified Oswestry:  MODIFIED OSWESTRY DISABILITY SCALE  Date: 09/10/2023 Score  Pain intensity 4 =  Pain medication provides me with little relief from pain.  2. Personal care (washing, dressing, etc.)  2 =  It is painful to take care of myself, and I am slow and careful.  3. Lifting 5 =  I cannot lift or carry anything at all.  4. Walking 4 = I can only walk with crutches or a cane.  5. Sitting 3 =  Pain prevents me from sitting more than  hour.  6. Standing 3 =  Pain prevents me from standing more than 1/2 hour.  7. Sleeping 1 = I can sleep well only by using pain medication.  8. Social Life 5 =  I have hardly any social life because of my pain.  9. Traveling 2 =  My pain restricts my travel over 2 hours.  10. Employment/ Homemaking 3 = Pain prevents me from doing anything but light duties.  Total 32/50= 64%   Interpretation of scores: Score Category Description  0-20% Minimal Disability The patient can cope with most living activities. Usually no treatment is indicated apart from advice on  lifting, sitting and exercise  21-40% Moderate Disability The patient experiences more pain and difficulty with sitting, lifting and standing. Travel and social life are more difficult and they may be disabled from work. Personal care, sexual activity and sleeping are not grossly affected, and the patient can usually be managed by conservative means  41-60% Severe Disability Pain remains the main problem in this group, but activities of daily living are affected. These patients require a detailed investigation  61-80% Crippled Back pain impinges on all aspects of the patient's life. Positive intervention is required  81-100% Bed-bound  These patients are either bed-bound or exaggerating their symptoms  Bluford FORBES Zoe DELENA Karon DELENA, et al. Surgery versus conservative management of stable thoracolumbar fracture: the PRESTO feasibility RCT. [place]): [employer]; 2021 Nov. [facility] Technology Assessment, No. 25.62.) Appendix 3, Oswestry Disability Index category descriptors. Available from: FindJewel[employer].cz  Minimally Clinically Important Difference (MCID) = 12.8%       GOALS: Goals reviewed with patient? Yes  SHORT TERM GOALS: Target date: 09/25/2023  Pt will be independent with HEP for improved strength, pain, transfers. Baseline: none; 10/01/23: compliant Goal status: MET  2.  Pt will improve 5x sit<>stand to less than or equal to 30 sec to demonstrate improved functional strength and transfer efficiency. Baseline: 37.25 sec; 24.95 sec with B UE support 10/06/23 Goal status: MET 10/06/23  LONG TERM GOALS: Target date: 10/23/2023  Pt will be independent with progression of HEP for improved pain, strength, transfers. Baseline:  Goal status: IN PROGRESS  2.  Pt will improve Modified Oswestry Score to less than or equal to 50%, to demo less pain interference in daily activities. Baseline: 64% Goal status: IN PROGRESS  3.  Pt will improve 5x  sit<>stand to less than or equal to 20 sec to demonstrate improved functional strength and transfer efficiency. Baseline:  Goal status: IN PROGRESS  4.  Pt will rate pain in low back, decreased by at least 50%, for improved ADLs and gait activities. Baseline: 6-8/10 Goal status: IN PROGRESS  5. Patient to report tolerance for 30 minutes of standing/walking without device to return to PLOF.  Baseline: reports tolerating 30 min with RW Goal status: INITIAL  6.. Patient to demonstrate gait speed of at least 2.3 ft/sec in order to improve access to community.  Baseline: 1.9 ft/sec with RW Goal status: INITIAL    ASSESSMENT:  CLINICAL IMPRESSION: Pt presents today with reports of feeling stronger compared to las visit; [place] practicing [place]inside gait[place] with the cane helped her a good deal. Skilled PT session focused on dynamic balance, functional strengthening and gait activities with cane.  Pt able to progress from BUE to 1 UE support with most standing activities; able to upgrade HEP to include dynamic balance and step ups (she can use steps and hallway rail at [place]).  She completes session today without complaints.  She will continue to benefit from skilled PT towards goals for improved functional mobility and decreased fall risk.   OBJECTIVE IMPAIRMENTS: Abnormal gait, decreased balance, decreased mobility, difficulty walking, decreased strength, impaired flexibility, postural dysfunction, and pain.   ACTIVITY LIMITATIONS: sitting, standing, sleeping, transfers, bed mobility, and locomotion level  PARTICIPATION LIMITATIONS: community activity  PERSONAL FACTORS: 3+ comorbidities: See PMH above are also affecting patient's functional outcome.   REHAB POTENTIAL: Good  CLINICAL DECISION MAKING: Evolving/moderate complexity  EVALUATION COMPLEXITY: Moderate  PLAN:  PT FREQUENCY: 1-2x/week  PT DURATION: 6 weeks  PLANNED INTERVENTIONS: [id number]- PT  Re-evaluation, 97750- Physical Performance Testing, 97110-Therapeutic exercises, 97530- Therapeutic activity, [id number]- Neuromuscular re-education, 97535- Self Care, [id number]- Manual therapy, [id number]- Gait training, [phone]- Canalith repositioning, [id number]- Aquatic Therapy, [phone]- Electrical stimulation (manual), [phone] (1-2 muscles), [id number] (3+ muscles)- Dry Needling, Patient/Family education, Balance training, Stair training, Taping, Joint mobilization, Spinal mobilization, Vestibular training, DME instructions, Cryotherapy, and Moist heat  PLAN FOR NEXT SESSION: Consider assessing Berg, DGI; check LTGs and recert.  Review HEP updates and progress for abdominal and lumbar strengthening; Gait training on outdoor surfaces with cane; BLE strenghtening    Greig Anon, PT 10/20/23 1:17 PM Phone: [phone] Fax: [phone]  [facility] at [facility] [address] [place], [place] [address] Phone # [phone] Fax # [phone]

## 2023-10-21 ENCOUNTER — Ambulatory Visit: Admitting: Physical Therapy

## 2023-10-22 ENCOUNTER — Encounter: Payer: Self-pay | Admitting: Physical Therapy

## 2023-10-22 ENCOUNTER — Ambulatory Visit: Admitting: Physical Therapy

## 2023-10-22 DIAGNOSIS — R293 Abnormal posture: Secondary | ICD-10-CM | POA: Diagnosis not present

## 2023-10-22 DIAGNOSIS — M6281 Muscle weakness (generalized): Secondary | ICD-10-CM | POA: Diagnosis not present

## 2023-10-22 DIAGNOSIS — R262 Difficulty in walking, not elsewhere classified: Secondary | ICD-10-CM

## 2023-10-22 DIAGNOSIS — R2681 Unsteadiness on feet: Secondary | ICD-10-CM

## 2023-10-22 DIAGNOSIS — M5459 Other low back pain: Secondary | ICD-10-CM

## 2023-10-22 DIAGNOSIS — M25561 Pain in right knee: Secondary | ICD-10-CM | POA: Diagnosis not present

## 2023-10-22 NOTE — Therapy (Signed)
 OUTPATIENT PHYSICAL THERAPY NEURO TREATMENT NOTE/RECERT/PROGRESS NOTE   Patient Name: Wendy Jackson MRN: [id number] DOB:1945-09-06, 78 y.o., female Today's Date: 10/23/2023   PCP: Catherine Charlies LABOR, DO REFERRING PROVIDER: Persons, Ronal Dragon, [place]  (Additional referral from Jerri Kay HERO, MD for [phone] (ICD-10-CM) - Acute pain of right knee M54.50 (ICD-10-CM) - Acute bilateral low back pain without sciatica)  Progress Note Reporting Period 09/10/2023 to 10/22/2023  See note below for Objective Data and Assessment of Progress/Goals.      END OF SESSION:  PT End of Session - 10/22/23 1451     Visit Number 9    Number of Visits 21    Date for Recertification  12/04/23    Authorization Type [employer]BS medicare    Progress Note Due on Visit 19    PT Start Time 1451    PT Stop Time 1535    PT Time Calculation (min) 44 min    Equipment Utilized During Treatment Gait belt    Activity Tolerance Patient tolerated treatment well    Behavior During Therapy WFL for tasks assessed/performed               Past Medical History:  Diagnosis Date   Cataract 03/2019   Glaucoma Approximately 2005   [place] retinal tear of left eye 04/02/2021   Hyperlipidemia LDL goal <70 01/11/2021   Hypertension 2022   Long-term use of Plaquenil  04/02/2021   Lupus    new rheum questioning original dx. continuing Plaquenil  at lower dose for now.   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 bladder, acquired    Pseudophakia of both eyes 04/02/2021   Retinal detachment with multiple breaks, right eye 04/02/2021   Stroke (HCC) 04/2020   Thyroid  disease    Tibial plateau fracture, right 2025   Past Surgical History:  Procedure Laterality Date   ENDARTERECTOMY Right 05/02/2020   Procedure: RIGHT CAROTID ENDARTERECTOMY;  Surgeon: Oris Krystal FALCON, MD;  Location: [facility] OR;  Service: Vascular;  Laterality: Right;   EYE SURGERY  01/2020   LEFT HEART CATH AND CORONARY  ANGIOGRAPHY N/A 02/03/2023   Procedure: LEFT HEART CATH AND CORONARY ANGIOGRAPHY;  Surgeon: Verlin Lonni BIRCH, MD;  Location: [facility] INVASIVE CV LAB;  Service: Cardiovascular;  Laterality: N/A;   SCLERAL BUCKLE Right 2004   TUBAL LIGATION  Feb 1982   Patient Active Problem List   Diagnosis Date Noted   Incontinence of feces with fecal urgency 05/05/2023   Gastroesophageal reflux disease with esophagitis without hemorrhage 05/05/2023   Bloating 05/05/2023   Other specified abnormal immunological findings in serum 04/03/2023   Elevated erythrocyte sedimentation rate 04/03/2023   High coronary artery calcium  score: 1525; 96th percentile 01/29/2023   SVT (supraventricular tachycardia) 12/31/2022   Other fatigue 12/31/2022   Decreased exercise tolerance 12/31/2022   Polymyalgia rheumatica 11/25/2022   Polyarthralgia 11/25/2022   Other secondary scoliosis, lumbar region 11/17/2022   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   Other female genital prolapse 10/29/2021   Long-term use of Plaquenil  04/02/2021   Primary open angle glaucoma (POAG) of both eyes, severe stage 04/02/2021   Primary hypertension 01/11/2021   Hyperlipidemia LDL goal <70 01/11/2021   Acquired thrombophilia 12/11/2020   BMI less than 19,adult 12/11/2020   Hypothyroidism 12/11/2020   History of CVA (cerebrovascular accident) 05/02/2020   Lupus    Allergic rhinitis    CAD (coronary artery disease) 05/20/2018    ONSET DATE: 09/04/2023 (MD referral)  REFERRING DIAG: M54.50 (  ICD-10-CM) - Acute bilateral low back pain without sciatica  New referral in patient's chart from Dr. Jerri stating Right tibial plateau fracture. WBAT. Knee rehab. Strengthening. Gait training.   THERAPY DIAG:  Abnormal posture  Muscle weakness (generalized)  Unsteadiness on feet  Difficulty in walking, not elsewhere classified  Other low back pain  Rationale for Evaluation and Treatment: Rehabilitation  SUBJECTIVE:                                                                                                                                                                                              SUBJECTIVE STATEMENT: Did some of the new exercises at the rail in the hallway.    Pt accompanied by: self and significant other  PERTINENT HISTORY: followed by Dr. Jerri for a lateral nondisplaced tibial plateau fracture.  She has been having to use a walker and bear minimal weight.  She does have a history of significant degenerative scoliosis and degenerative changes for which she had seen Dr. Barbarann in the past. Also hx of IBS, fecal leakage, has had pelvic floor therapy; polymalgia rheumatica   PAIN:  Are you having pain? No  PRECAUTIONS: Fall and Other: 10/01/23 pt reports she is now full weight bearing on R  RED FLAGS: None   WEIGHT BEARING RESTRICTIONS: None  FALLS: Has patient fallen in last 6 months? Yes. Number of falls 1; 3 falls in past 13 months (all have been tripping)  LIVING ENVIRONMENT: Lives with: lives with their spouse Lives in: House/apartment [place] Stairs: No Has following equipment at home: Single point cane, Environmental consultant - 2 wheeled, and single leg scooter (unable to use)  PLOF: Independent  PATIENT GOALS: To get back less tight and painful  OBJECTIVE:    TODAY'S TREATMENT: 10/22/2023 Activity Comments  Review of updates to HEP-sidestepping, forward/back walking Good return demo  10 M walk:   RW:  15.69 (2.09 ft/sec) SPC: 19.25 (1.7 ft/sec No device:  19.44sec (1.69 ft/sec)   DGI:  12/24 With cane  FTSTS:  26.46 sec Hands at knees  Standing marching in place x 10 Sidestep/together R and L x 10 UE support  NuStep, Level 3, 4 extremities x 5 min No c/o; working on BLE strengthening     Access Code: [email or web address] URL: [email or web address] Date: 10/20/2023 Prepared by: [facility] - Outpatient  Rehab - [facility]  Exercises - Supine Pelvic Tilt  - 1-2 x daily - 7 x weekly - 1-2 sets  - 5-10 reps - Lower Trunk Rotations  - 1 x daily - 7 x weekly - 2 sets - 10 reps - Seated Flexion  Stretch with [employer]  - 1 x daily - 7 x weekly - 1 sets - 3-5 reps - Seated Thoracic Flexion and Rotation with Swiss Ball  - 1 x daily - 7 x weekly - 1 sets - 3-5 reps - 10-15 sec hold - Seated Pelvic Tilt  - 1 x daily - 7 x weekly - 2 sets - 5-10 reps - Supine Scapular Retraction  - 1 x daily - 7 x weekly - 2 sets - 10 reps - Seated Hamstring Stretch  - 1 x daily - 7 x weekly - 2 sets - 30 sec hold - Standing Gluteal Sets  - 1 x daily - 7 x weekly - 2 sets - 10 reps - 3 sec hold - Standing Scapular Retraction  - 1 x daily - 7 x weekly - 3 sets - 10 reps - Seated Long Arc Quad  - 1 x daily - 7 x weekly - 2 sets - 10 reps - Backward Walking with Counter Support  - 1 x daily - 7 x weekly - 1 sets - 3-5 reps - Side Stepping with Counter Support  - 1 x daily - 7 x weekly - 1 sets - 3-5 reps - Forward Step Up  - 1 x daily - 7 x weekly - 3 sets - 5 reps   PATIENT EDUCATION: Education details: Progress towards goals, POC Person educated: Patient Education method: Explanation, Demonstration, Tactile cues, Verbal cues, and Handouts Education comprehension: verbalized understanding and returned demonstration     Note: Objective measures were completed at Evaluation unless otherwise noted.  DIAGNOSTIC FINDINGS: No acute findings per pt/husband report-curvature and severe arthritis  COGNITION: Overall cognitive status: Within functional limits for tasks assessed   SENSATION: Not tested  MUSCLE LENGTH: Hamstrings: Right NT deg; Left -30 deg   LOWER EXTREMITY MMT:     Active (in sitting) Right 9/16 Left 9/16  Hip flexion 4+ 4  Hip extension    Hip abduction 4 4  Hip adduction 4+ 4+  Hip internal rotation    Hip external rotation    Knee flexion 4+ 4+  Knee extension 4+ 5  Ankle dorsiflexion 4 4  Ankle plantarflexion 4+ 4+  Ankle inversion    Ankle eversion     (Blank rows =  not tested)  LOWER EXTREMITY ROM:    ROM Right 9/16 Left 9/16  Knee flexion 134 135  Knee extension 3 0  (Blank rows = not tested)  POSTURE: rounded shoulders, forward head, increased thoracic kyphosis, weight shift left, and L shoulder lower than R   PALPATION:  Tender along lumbar paraspinals bilaterally  BED MOBILITY:  Findings: Tends to go long sit<>supine with pain upon supine; instructed in log roll technique:  supine>L side>sit with min assist and cues  TRANSFERS: Sit to stand: Modified independence  Assistive device utilized: BUE support     Stand to sit: Modified independence  Assistive device utilized: BUE support      GAIT: Findings: Gait Characteristics: per MD note-minimal RLE weightbearing, step to pattern, step through pattern, and decreased stance time- Right, Distance walked: 20 ft, Assistive device utilized:Walker - 2 wheeled, Level of assistance: SBA, and Comments: antalgic pattern  FUNCTIONAL TESTS:  5 times sit to stand: 37.25 sec with BUE support  PATIENT SURVEYS:  Modified Oswestry:  MODIFIED OSWESTRY DISABILITY SCALE  Date: 09/10/2023 Score  Pain intensity 4 =  Pain medication provides me with little relief from pain.  2. Personal care (washing, dressing, etc.)  2 =  It is painful to take care of myself, and I am slow and careful.  3. Lifting 5 =  I cannot lift or carry anything at all.  4. Walking 4 = I can only walk with crutches or a cane.  5. Sitting 3 =  Pain prevents me from sitting more than  hour.  6. Standing 3 =  Pain prevents me from standing more than 1/2 hour.  7. Sleeping 1 = I can sleep well only by using pain medication.  8. Social Life 5 =  I have hardly any social life because of my pain.  9. Traveling 2 =  My pain restricts my travel over 2 hours.  10. Employment/ Homemaking 3 = Pain prevents me from doing anything but light duties.  Total 32/50= 64%   Interpretation of scores: Score Category Description  0-20% Minimal  Disability The patient can cope with most living activities. Usually no treatment is indicated apart from advice on lifting, sitting and exercise  21-40% Moderate Disability The patient experiences more pain and difficulty with sitting, lifting and standing. Travel and social life are more difficult and they may be disabled from work. Personal care, sexual activity and sleeping are not grossly affected, and the patient can usually be managed by conservative means  41-60% Severe Disability Pain remains the main problem in this group, but activities of daily living are affected. These patients require a detailed investigation  61-80% Crippled Back pain impinges on all aspects of the patient's life. Positive intervention is required  81-100% Bed-bound  These patients are either bed-bound or exaggerating their symptoms  Bluford FORBES Zoe DELENA Karon DELENA, et al. Surgery versus conservative management of stable thoracolumbar fracture: the PRESTO feasibility RCT. [place]): [employer]; 2021 Nov. [facility] Technology Assessment, No. 25.62.) Appendix 3, Oswestry Disability Index category descriptors. Available from: FindJewel[employer].cz  Minimally Clinically Important Difference (MCID) = 12.8%       GOALS: Goals reviewed with patient? Yes  SHORT TERM GOALS: Target date: 09/25/2023  Pt will be independent with HEP for improved strength, pain, transfers. Baseline: none; 10/01/23: compliant Goal status: MET  2.  Pt will improve 5x sit<>stand to less than or equal to 30 sec to demonstrate improved functional strength and transfer efficiency. Baseline: 37.25 sec; 24.95 sec with B UE support 10/06/23 Goal status: MET 10/06/23  LONG TERM GOALS: Target date: 10/23/2023>UPDATED TARGET 12/04/2023  Pt will be independent with progression of HEP for improved pain, strength, transfers. Baseline: updated 10/7 Goal status: IN PROGRESS  2.  Pt will improve Modified Oswestry  Score to less than or equal to 50%, to demo less pain interference in daily activities. Baseline: 64% Goal status: IN PROGRESS  3.  Pt will improve 5x sit<>stand to less than or equal to 20 sec to demonstrate improved functional strength and transfer efficiency. Baseline: 26 sec 10/22/2023 (improved from 37 sec) Goal status: IN PROGRESS, 10/22/2023  4.  Pt will rate pain in low back, decreased by at least 50%, for improved ADLs and gait activities. Baseline: much less pain, 10/22/2023 Goal status: MET, 10/22/2023  5. Patient to report tolerance for 30 minutes of standing/walking without device to return to PLOF.  Baseline: reports tolerating 30 min with RW; increased forward lean without device or with cane  Goal status: IN PROGRESS 10/22/2023  6.. Patient to demonstrate gait speed of at least 2.3 ft/sec in order to improve access to community.  Baseline: 1.9 ft/sec with RW; 2.09 ft/sec with  RW 10/22/2023 Goal status: INITIAL    ASSESSMENT:  CLINICAL IMPRESSION: PROGRESS NOTE/RECERT:  Pt presents today with reports of feeling better balanced and working on exercises and trying to stand tall at RW.  Assessed LTGs, with pt progressing towards goals, just not fully met to goal level.  LTG 1 ongoing, as HEP is being updated and progressed.  LTG 2 not assessed today due to time constraints, but LTG 4 met, with overall reported reduction in pain.  Objective measures FTSTS 26 sec (improved from 37 sec), gait velocity improved from 1.9>2.09 ft/sec with RW.  Gait velocity with cane is 1.7 ft/sec and DGI score 12.24 indicate increased fall risk. She is progressing and has no c/o knee pain, less reports of back pain; however, balance and functional strength are currently limiting pt's independent functional mobility.  She will continue to benefit from skilled PT towards goals for improved functional mobility, independence, and decreased fall risk.  OBJECTIVE IMPAIRMENTS: Abnormal gait, decreased balance,  decreased mobility, difficulty walking, decreased strength, impaired flexibility, postural dysfunction, and pain.   ACTIVITY LIMITATIONS: sitting, standing, sleeping, transfers, bed mobility, and locomotion level  PARTICIPATION LIMITATIONS: community activity  PERSONAL FACTORS: 3+ comorbidities: See PMH above are also affecting patient's functional outcome.   REHAB POTENTIAL: Good  CLINICAL DECISION MAKING: Evolving/moderate complexity  EVALUATION COMPLEXITY: Moderate  PLAN:  PT FREQUENCY: 1-2x/week  PT DURATION: 6 weeks  PLANNED INTERVENTIONS: [id number]- PT Re-evaluation, 97750- Physical Performance Testing, 97110-Therapeutic exercises, 97530- Therapeutic activity, [id number]- Neuromuscular re-education, 97535- Self Care, [id number]- Manual therapy, [id number]- Gait training, [phone]- Canalith repositioning, [id number]- Aquatic Therapy, [phone]- Electrical stimulation (manual), [phone] (1-2 muscles), [phone] (3+ muscles)- Dry Needling, Patient/Family education, Balance training, Stair training, Taping, Joint mobilization, Spinal mobilization, Vestibular training, DME instructions, Cryotherapy, and Moist heat  PLAN FOR NEXT SESSION:  How did Nustep and step ups go at home?  *Have pt complete Oswestry*  Progress postural strengthening; progress leg strength and balance.  Review HEP updates and progress for abdominal and lumbar strengthening; Gait training on outdoor surfaces with cane    Greig Anon, PT 10/23/23 12:33 PM Phone: [phone] Fax: [phone]  [facility] [place] [facility] [address], [address] [place], [place] [address] Phone # [phone] Fax # [phone]

## 2023-10-28 ENCOUNTER — Telehealth: Payer: Self-pay

## 2023-10-28 NOTE — Telephone Encounter (Signed)
 Copied from CRM [id number]. Topic: Clinical - Prescription Issue >> Oct 28, 2023  5:00 PM Wendy Jackson wrote: Reason for CRM: verapamil  (CALAN -SR) 120 MG CR tablet  Wendy Jackson from pharmacy is asking if tablets can be changed to capsule form due to the tablet form being on back ordered. The anticipated date is 11/30 for tablet form.   [phone] (M)

## 2023-10-29 ENCOUNTER — Ambulatory Visit: Admitting: Physical Medicine and Rehabilitation

## 2023-10-29 ENCOUNTER — Encounter: Payer: Self-pay | Admitting: Physical Medicine and Rehabilitation

## 2023-10-29 DIAGNOSIS — G8929 Other chronic pain: Secondary | ICD-10-CM | POA: Diagnosis not present

## 2023-10-29 DIAGNOSIS — W19XXXD Unspecified fall, subsequent encounter: Secondary | ICD-10-CM | POA: Diagnosis not present

## 2023-10-29 DIAGNOSIS — M546 Pain in thoracic spine: Secondary | ICD-10-CM

## 2023-10-29 DIAGNOSIS — S22080A Wedge compression fracture of T11-T12 vertebra, initial encounter for closed fracture: Secondary | ICD-10-CM

## 2023-10-29 DIAGNOSIS — I10 Essential (primary) hypertension: Secondary | ICD-10-CM | POA: Diagnosis not present

## 2023-10-29 DIAGNOSIS — M545 Low back pain, unspecified: Secondary | ICD-10-CM

## 2023-10-29 NOTE — Telephone Encounter (Signed)
 Called pharmacy but unable to speak to someone due to being put on hold multiple times. Will try back during morning hours

## 2023-10-29 NOTE — Progress Notes (Signed)
 Wendy Jackson - 78 y.o. female MRN [id number]  Date of birth: [birth year]  Office Visit Note: Visit Date: 10/29/2023 PCP: Catherine Charlies LABOR, DO Referred by: Catherine Charlies LABOR, DO  Subjective: Chief Complaint  Patient presents with   Lower Back - Pain   HPI: Wendy Jackson is a 77 y.o. female who comes in today per the request of Ronal Dragon Persons, PA for evaluation of chronic right sided middle back pain. Her pain is localized to upper lumbar/lower thoracic region. Her pain started after she sustained mechanical fall in July of this year. She tripped on curb and fell on both knees, sustained right lateral tibial plateau fracture. She is here today in follow up for her back pain. Her pain worsens with activity and movement. Describes as sore and aching sensation, she denies pain today. She reports significant improvement of pain with ongoing formal physical therapy. She does take Tylenol  intermittently as needed. Recent lumbar radiographs shows leftward curvature, multi level facet arthropathy to lower lumbar spine, age-indeterminate greater than 50% height loss of the T11 vertebral body. There is also chronic height loss of the L1 vertebral body. She reports history of DEXA scan many years ago, was told she has osteopenia. She uses walker to assist with ambulation. Patient denies focal weakness, numbness and tingling. No bowel/bladder incontinence.       Review of Systems  Musculoskeletal:  Negative for back pain.  Neurological:  Negative for tingling, sensory change, focal weakness and weakness.  All other systems reviewed and are negative.  Otherwise per HPI.  Assessment & Plan: Visit Diagnoses:    ICD-10-CM   1. Chronic right-sided low back pain without sciatica  M54.50    G89.29     2. Chronic right-sided thoracic back pain  M54.6    G89.29     3. Compression fracture of T11 vertebra, initial encounter (HCC)  S22.080A     4. Fall, subsequent encounter  W19.XXXD        Plan:  Findings:  Chronic right sided thoracic/upper lumbar pain. Her pain has significantly improved over the last several months with consistent physical therapy and home exercise regimen. She remains active at home and does walk outside several days a week. I do think her pain is likely due to new compression fracture of T11 vertebral body. She likely sustained this fracture from fall in July. I explained to her that this fracture is likely healed. She can continue with physical therapy as directed. I am concerned about possible osteoporosis, she is scheduled to follow up with her PCP next week, would like her to have new DEXA scan. I am happy to place referral to our osteoporosis clinic should she choose to have treatment in our office. I do think she is fairly deconditioned and am hopeful PT can continue to work on strengthening and gait exercises. We are happy to see her back as needed. She can continue with Tylenol  for pain. She has no questions at this time. No red flag symptoms noted upon exam today.     Meds & Orders: No orders of the defined types were placed in this encounter.  No orders of the defined types were placed in this encounter.   Follow-up: Return if symptoms worsen or fail to improve.   Procedures: No procedures performed      Clinical History: CLINICAL DATA:  Low back pain   EXAM: LUMBAR SPINE - 2-3 VIEW   COMPARISON:  CT abdomen pelvis 02/21/2022   FINDINGS: Leftward  curvature of the lumbar spine. Chronic height loss of the L1 vertebral body most pronounced at the inferior endplate. Multilevel degenerative disc disease throughout the lower thoracic and lumbar spine. Grade 1 anterolisthesis L4 on L5. Multilevel lower lumbar spine facet degenerative changes. Age-indeterminate greater than 50% height loss of the T11 vertebral body.   IMPRESSION: 1. Age-indeterminate greater than 50% height loss of the T11 vertebral body. Correlate for point tenderness. 2. Chronic  height loss of the L1 vertebral body. 3. Multilevel degenerative disc and facet disease.     Electronically Signed   By: Bard Moats M.D.   On: 09/16/2023 21:39   She reports that she has never smoked. She has never been exposed to tobacco smoke. She has never used smokeless tobacco. No results for input(s): HGBA1C, LABURIC in the last 8760 hours.  Objective:  VS:  HT:    WT:   BMI:     BP:   HR: bpm  TEMP: ( )  RESP:  Physical Exam Vitals and nursing note reviewed.  HENT:     Head: Normocephalic and atraumatic.     Right Ear: External ear normal.     Left Ear: External ear normal.     Nose: Nose normal.     Mouth/Throat:     Mouth: Mucous membranes are moist.  Eyes:     Extraocular Movements: Extraocular movements intact.  Cardiovascular:     Rate and Rhythm: Normal rate.     Pulses: Normal pulses.  Pulmonary:     Effort: Pulmonary effort is normal.  Abdominal:     General: Abdomen is flat. There is no distension.  Musculoskeletal:     Cervical back: Normal range of motion.     Comments: Patient is slow to rise from seated position to standing. Good lumbar range of motion. No pain noted with facet loading. 5/5 strength noted with bilateral hip flexion, knee flexion/extension, ankle dorsiflexion/plantarflexion and EHL. No clonus noted bilaterally. No pain upon palpation of greater trochanters. No pain with internal/external rotation of bilateral hips. Sensation intact bilaterally. Negative slump test bilaterally. Ambulates with walker, gait slow and unsteady.     Skin:    General: Skin is warm and dry.     Capillary Refill: Capillary refill takes less than 2 seconds.  Neurological:     Mental Status: She is alert and oriented to person, place, and time.     Gait: Gait abnormal.  Psychiatric:        Mood and Affect: Mood normal.        Behavior: Behavior normal.     Ortho Exam  Imaging: No results found.  Past Medical/Family/Surgical/Social  History: Medications & Allergies reviewed per EMR, new medications updated. Patient Active Problem List   Diagnosis Date Noted   Incontinence of feces with fecal urgency 05/05/2023   Gastroesophageal reflux disease with esophagitis without hemorrhage 05/05/2023   Bloating 05/05/2023   Other specified abnormal immunological findings in serum 04/03/2023   Elevated erythrocyte sedimentation rate 04/03/2023   High coronary artery calcium  score: 1525; 96th percentile 01/29/2023   SVT (supraventricular tachycardia) 12/31/2022   Other fatigue 12/31/2022   Decreased exercise tolerance 12/31/2022   Polymyalgia rheumatica 11/25/2022   Polyarthralgia 11/25/2022   Other secondary scoliosis, lumbar region 11/17/2022   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   Other female genital prolapse 10/29/2021   Long-term use of Plaquenil  04/02/2021   Primary open angle glaucoma (POAG) of both eyes, severe stage 04/02/2021   Primary hypertension 01/11/2021   Hyperlipidemia  LDL goal <70 01/11/2021   Acquired thrombophilia 12/11/2020   BMI less than 19,adult 12/11/2020   Hypothyroidism 12/11/2020   History of CVA (cerebrovascular accident) 05/02/2020   Lupus    Allergic rhinitis    CAD (coronary artery disease) 05/20/2018   Past Medical History:  Diagnosis Date   Cataract 03/2019   Glaucoma Approximately 2005   [place] retinal tear of left eye 04/02/2021   Hyperlipidemia LDL goal <70 01/11/2021   Hypertension 2022   Long-term use of Plaquenil  04/02/2021   Lupus    new rheum questioning original dx. continuing Plaquenil  at lower dose for now.   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 bladder, acquired    Pseudophakia of both eyes 04/02/2021   Retinal detachment with multiple breaks, right eye 04/02/2021   Stroke (HCC) 04/2020   Thyroid  disease    Tibial plateau fracture, right 2025   Family History  Problem Relation Age of Onset   ALS Mother    Early death Mother     Dementia Father    Hypertension Father    Macular degeneration Maternal Aunt    Alcohol abuse Paternal Uncle    Alcohol abuse Paternal Uncle    Past Surgical History:  Procedure Laterality Date   ENDARTERECTOMY Right 05/02/2020   Procedure: RIGHT CAROTID ENDARTERECTOMY;  Surgeon: Oris Krystal FALCON, MD;  Location: [facility] OR;  Service: Vascular;  Laterality: Right;   EYE SURGERY  01/2020   LEFT HEART CATH AND CORONARY ANGIOGRAPHY N/A 02/03/2023   Procedure: LEFT HEART CATH AND CORONARY ANGIOGRAPHY;  Surgeon: Verlin Lonni BIRCH, MD;  Location: [facility] INVASIVE CV LAB;  Service: Cardiovascular;  Laterality: N/A;   SCLERAL BUCKLE Right 2004   TUBAL LIGATION  Feb 1982   Social History   Occupational History   Occupation: retired   Occupation: retired  Tobacco Use   Smoking status: Never    Passive exposure: Never   Smokeless tobacco: Never  Vaping Use   Vaping status: Never Used  Substance and Sexual Activity   Alcohol use: Never   Drug use: Never   Sexual activity: Not Currently    Birth control/protection: Surgical, Post-menopausal    Comment: tubal

## 2023-10-29 NOTE — Telephone Encounter (Signed)
 Ok to switch to capsule formation as long as insurance covers

## 2023-10-29 NOTE — Telephone Encounter (Signed)
 Please advise. Pharmacy wants to see if Verapamil  can be changed to capsule due to the tablets being on back order until 11/30

## 2023-10-29 NOTE — Progress Notes (Signed)
 Pain Scale   Average Pain 0 Patient advising she has lower back pain that was constant until she has been going thru PT, Patient  advising PT is helping with her pain.        +Driver, -BT, -Dye Allergies.

## 2023-10-30 LAB — BASIC METABOLIC PANEL WITH GFR
BUN/Creatinine Ratio: 35 — ABNORMAL HIGH (ref 12–28)
BUN: 24 mg/dL (ref 8–27)
CO2: 25 mmol/L (ref 20–29)
Calcium: 9.5 mg/dL (ref 8.7–10.3)
Chloride: 94 mmol/L — ABNORMAL LOW (ref 96–106)
Creatinine, Ser: 0.69 mg/dL (ref 0.57–1.00)
Glucose: 82 mg/dL (ref 70–99)
Potassium: 4 mmol/L (ref 3.5–5.2)
Sodium: 133 mmol/L — ABNORMAL LOW (ref 134–144)
eGFR: 90 mL/min/1.73 (ref >59)

## 2023-11-02 NOTE — Telephone Encounter (Signed)
 Spoke with pharmacy and they will fill accordingly

## 2023-11-02 NOTE — Telephone Encounter (Signed)
 Pharmacist called back in today to follow-up. I informed her that a nurse did reach out on the 16th but was unable to get someone on the phone. She asked for a callback. Please advise [phone].

## 2023-11-03 ENCOUNTER — Telehealth: Payer: Self-pay | Admitting: Pharmacist

## 2023-11-03 NOTE — Telephone Encounter (Signed)
 Called and spoke with patient regarding BP.  She thinks BP has improved [place] Feels [place]. Recommended call clinic if she notices BP trending either or up or down. Patient voiced understanding.

## 2023-11-06 ENCOUNTER — Telehealth: Payer: Self-pay | Admitting: Gastroenterology

## 2023-11-06 NOTE — Telephone Encounter (Signed)
 PT requesting to speak with nurse regarding her bloating. Please advise.

## 2023-11-06 NOTE — Telephone Encounter (Signed)
 Spoke with the patient. She has not been having good bowel movements. Undergoing PT for her spine and using a walker to ambulate. She feels bloated. Does not feel this way until later in the day. Mornings are good. She endorses constipation however she had an emptying bowel movement today. She tries to avoid dairy as much as possible. Admits to some dietary indiscretions. She has not been using IBgard but remembers it as being helpful. She has not been taking Benefiber but was uncertain if she should. Reviewed the plan from her visit in April with the patient. She agrees that she will try IBgard again. Then add Benefiber back starting with a teaspoon BID with meals. She will give us  feedback on her progress in a week.

## 2023-11-09 ENCOUNTER — Encounter: Payer: Self-pay | Admitting: Physical Therapy

## 2023-11-09 ENCOUNTER — Ambulatory Visit: Admitting: Physical Therapy

## 2023-11-09 DIAGNOSIS — R2681 Unsteadiness on feet: Secondary | ICD-10-CM

## 2023-11-09 DIAGNOSIS — M6281 Muscle weakness (generalized): Secondary | ICD-10-CM | POA: Diagnosis not present

## 2023-11-09 DIAGNOSIS — M5459 Other low back pain: Secondary | ICD-10-CM | POA: Diagnosis not present

## 2023-11-09 DIAGNOSIS — R293 Abnormal posture: Secondary | ICD-10-CM | POA: Diagnosis not present

## 2023-11-09 DIAGNOSIS — R262 Difficulty in walking, not elsewhere classified: Secondary | ICD-10-CM | POA: Diagnosis not present

## 2023-11-09 DIAGNOSIS — M25561 Pain in right knee: Secondary | ICD-10-CM | POA: Diagnosis not present

## 2023-11-09 NOTE — Therapy (Signed)
 OUTPATIENT PHYSICAL THERAPY NEURO TREATMENT NOTE   Patient Name: Wendy Jackson MRN: [id number] DOB:Sep 17, 1945, 78 y.o., female Today's Date: 11/09/2023   PCP: Catherine Charlies LABOR, DO REFERRING PROVIDER: Persons, Ronal Dragon, [place]  (Additional referral from Jerri Kay HERO, MD for [phone] (ICD-10-CM) - Acute pain of right knee M54.50 (ICD-10-CM) - Acute bilateral low back pain without sciatica)       END OF SESSION:  PT End of Session - 11/09/23 1220     Visit Number 10    Number of Visits 21    Date for Recertification  12/04/23    Authorization Type [employer]BS medicare    Progress Note Due on Visit 19    PT Start Time 1234    PT Stop Time 1321    PT Time Calculation (min) 47 min    Equipment Utilized During Treatment Gait belt    Activity Tolerance Patient tolerated treatment well    Behavior During Therapy WFL for tasks assessed/performed               Past Medical History:  Diagnosis Date   Cataract 03/2019   Glaucoma Approximately 2005   [place] retinal tear of left eye 04/02/2021   Hyperlipidemia LDL goal <70 01/11/2021   Hypertension 2022   Long-term use of Plaquenil  04/02/2021   Lupus    new rheum questioning original dx. continuing Plaquenil  at lower dose for now.   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 bladder, acquired    Pseudophakia of both eyes 04/02/2021   Retinal detachment with multiple breaks, right eye 04/02/2021   Stroke (HCC) 04/2020   Thyroid  disease    Tibial plateau fracture, right 2025   Past Surgical History:  Procedure Laterality Date   ENDARTERECTOMY Right 05/02/2020   Procedure: RIGHT CAROTID ENDARTERECTOMY;  Surgeon: Oris Krystal FALCON, MD;  Location: [facility] OR;  Service: Vascular;  Laterality: Right;   EYE SURGERY  01/2020   LEFT HEART CATH AND CORONARY ANGIOGRAPHY N/A 02/03/2023   Procedure: LEFT HEART CATH AND CORONARY ANGIOGRAPHY;  Surgeon: Verlin Lonni BIRCH, MD;  Location: [facility] INVASIVE CV LAB;   Service: Cardiovascular;  Laterality: N/A;   SCLERAL BUCKLE Right 2004   TUBAL LIGATION  Feb 1982   Patient Active Problem List   Diagnosis Date Noted   Incontinence of feces with fecal urgency 05/05/2023   Gastroesophageal reflux disease with esophagitis without hemorrhage 05/05/2023   Bloating 05/05/2023   Other specified abnormal immunological findings in serum 04/03/2023   Elevated erythrocyte sedimentation rate 04/03/2023   High coronary artery calcium  score: 1525; 96th percentile 01/29/2023   SVT (supraventricular tachycardia) 12/31/2022   Other fatigue 12/31/2022   Decreased exercise tolerance 12/31/2022   Polymyalgia rheumatica 11/25/2022   Polyarthralgia 11/25/2022   Other secondary scoliosis, lumbar region 11/17/2022   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   Other female genital prolapse 10/29/2021   Long-term use of Plaquenil  04/02/2021   Primary open angle glaucoma (POAG) of both eyes, severe stage 04/02/2021   Primary hypertension 01/11/2021   Hyperlipidemia LDL goal <70 01/11/2021   Acquired thrombophilia 12/11/2020   BMI less than 19,adult 12/11/2020   Hypothyroidism 12/11/2020   History of CVA (cerebrovascular accident) 05/02/2020   Lupus    Allergic rhinitis    CAD (coronary artery disease) 05/20/2018    ONSET DATE: 09/04/2023 (MD referral)  REFERRING DIAG: M54.50 (ICD-10-CM) - Acute bilateral low back pain without sciatica  New referral in patient's chart from Dr. Jerri stating  Right tibial plateau fracture. WBAT. Knee rehab. Strengthening. Gait training.   THERAPY DIAG:  Abnormal posture  Muscle weakness (generalized)  Unsteadiness on feet  Rationale for Evaluation and Treatment: Rehabilitation  SUBJECTIVE:                                                                                                                                                                                             SUBJECTIVE STATEMENT: [place]  Saw orthopedic MD and they saw possible thoracic compression fracture that is healing.  Mainly using the RW-can stand up straighter with this versus lean forward more by the end of the day with the cane.  Pt accompanied by: self and significant other  PERTINENT HISTORY: followed by Dr. Jerri for a lateral nondisplaced tibial plateau fracture.  She has been having to use a walker and bear minimal weight.  She does have a history of significant degenerative scoliosis and degenerative changes for which she had seen Dr. Barbarann in the past. Also hx of IBS, fecal leakage, has had pelvic floor therapy; polymalgia rheumatica   PAIN:  Are you having pain? No  PRECAUTIONS: Fall and Other: 10/01/23 pt reports she is now full weight bearing on R  RED FLAGS: None   WEIGHT BEARING RESTRICTIONS: None  FALLS: Has patient fallen in last 6 months? Yes. Number of falls 1; 3 falls in past 13 months (all have been tripping)  LIVING ENVIRONMENT: Lives with: lives with their spouse Lives in: House/apartment [place] Stairs: No Has following equipment at home: Single point cane, Environmental Consultant - 2 wheeled, and single leg scooter (unable to use)  PLOF: Independent  PATIENT GOALS: To get back less tight and painful  OBJECTIVE:   Have used the Nustep once since last session; tried the steps in the hallway TODAY'S TREATMENT: 11/09/2023 Activity Comments  Review of HEP: Forward step ups 2 x 10 Cues to lessen UE support  Sidestep and forward/back walk, 2 x 20 ft No support, min guard and cues for abdominal activation  Standing scapular retraction Shoulder extension  Paloff press, seated 2 x 5 Red band x 10 Red band x 8 reps-cues for foot placement and posture   Sit to stand x 5 Squat sit to stand 2 x 5   Weighted carry 4# 50 ft x 2 reps, no device CGA  Gait with SPC 50 ft x 4 reps Cues for look ahead to visual target  Gait with RW out of session, 50 ft Cues to lessen UE support     Access Code:  [email or web address] URL: [email or web address] Date: 10/20/2023  Prepared by: [facility] Outpatient  Rehab - [facility]  Exercises - Supine Pelvic Tilt  - 1-2 x daily - 7 x weekly - 1-2 sets - 5-10 reps - Lower Trunk Rotations  - 1 x daily - 7 x weekly - 2 sets - 10 reps - Seated Flexion Stretch with Swiss Ball  - 1 x daily - 7 x weekly - 1 sets - 3-5 reps - Seated Thoracic Flexion and Rotation with Swiss Ball  - 1 x daily - 7 x weekly - 1 sets - 3-5 reps - 10-15 sec hold - Seated Pelvic Tilt  - 1 x daily - 7 x weekly - 2 sets - 5-10 reps - Supine Scapular Retraction  - 1 x daily - 7 x weekly - 2 sets - 10 reps - Seated Hamstring Stretch  - 1 x daily - 7 x weekly - 2 sets - 30 sec hold - Standing Gluteal Sets  - 1 x daily - 7 x weekly - 2 sets - 10 reps - 3 sec hold - Standing Scapular Retraction  - 1 x daily - 7 x weekly - 3 sets - 10 reps - Seated Long Arc Quad  - 1 x daily - 7 x weekly - 2 sets - 10 reps - Backward Walking with Counter Support  - 1 x daily - 7 x weekly - 1 sets - 3-5 reps - Side Stepping with Counter Support  - 1 x daily - 7 x weekly - 1 sets - 3-5 reps - Forward Step Up  - 1 x daily - 7 x weekly - 3 sets - 5 reps   PATIENT EDUCATION: Education details: Answered husband's questions about progression of therapy-addressing posture, lower extremity strength, gait training, while trying to avoid pain (in back)  Person educated: Patient Education method: Explanation, Demonstration, Tactile cues, Verbal cues, and Handouts Education comprehension: verbalized understanding and returned demonstration     Note: Objective measures were completed at Evaluation unless otherwise noted.  DIAGNOSTIC FINDINGS: No acute findings per pt/husband report-curvature and severe arthritis  COGNITION: Overall cognitive status: Within functional limits for tasks assessed   SENSATION: Not tested  MUSCLE LENGTH: Hamstrings: Right NT deg; Left -30 deg   LOWER EXTREMITY MMT:      Active (in sitting) Right 9/16 Left 9/16  Hip flexion 4+ 4  Hip extension    Hip abduction 4 4  Hip adduction 4+ 4+  Hip internal rotation    Hip external rotation    Knee flexion 4+ 4+  Knee extension 4+ 5  Ankle dorsiflexion 4 4  Ankle plantarflexion 4+ 4+  Ankle inversion    Ankle eversion     (Blank rows = not tested)  LOWER EXTREMITY ROM:    ROM Right 9/16 Left 9/16  Knee flexion 134 135  Knee extension 3 0  (Blank rows = not tested)  POSTURE: rounded shoulders, forward head, increased thoracic kyphosis, weight shift left, and L shoulder lower than R   PALPATION:  Tender along lumbar paraspinals bilaterally  BED MOBILITY:  Findings: Tends to go long sit<>supine with pain upon supine; instructed in log roll technique:  supine>L side>sit with min assist and cues  TRANSFERS: Sit to stand: Modified independence  Assistive device utilized: BUE support     Stand to sit: Modified independence  Assistive device utilized: BUE support      GAIT: Findings: Gait Characteristics: per MD note-minimal RLE weightbearing, step to pattern, step through pattern, and decreased stance  time- Right, Distance walked: 20 ft, Assistive device utilized:Walker - 2 wheeled, Level of assistance: SBA, and Comments: antalgic pattern  FUNCTIONAL TESTS:  5 times sit to stand: 37.25 sec with BUE support  PATIENT SURVEYS:  Modified Oswestry:  MODIFIED OSWESTRY DISABILITY SCALE  Date: 09/10/2023 Score  Pain intensity 4 =  Pain medication provides me with little relief from pain.  2. Personal care (washing, dressing, etc.) 2 =  It is painful to take care of myself, and I am slow and careful.  3. Lifting 5 =  I cannot lift or carry anything at all.  4. Walking 4 = I can only walk with crutches or a cane.  5. Sitting 3 =  Pain prevents me from sitting more than  hour.  6. Standing 3 =  Pain prevents me from standing more than 1/2 hour.  7. Sleeping 1 = I can sleep well only by using pain  medication.  8. Social Life 5 =  I have hardly any social life because of my pain.  9. Traveling 2 =  My pain restricts my travel over 2 hours.  10. Employment/ Homemaking 3 = Pain prevents me from doing anything but light duties.  Total 32/50= 64%   Interpretation of scores: Score Category Description  0-20% Minimal Disability The patient can cope with most living activities. Usually no treatment is indicated apart from advice on lifting, sitting and exercise  21-40% Moderate Disability The patient experiences more pain and difficulty with sitting, lifting and standing. Travel and social life are more difficult and they may be disabled from work. Personal care, sexual activity and sleeping are not grossly affected, and the patient can usually be managed by conservative means  41-60% Severe Disability Pain remains the main problem in this group, but activities of daily living are affected. These patients require a detailed investigation  61-80% Crippled Back pain impinges on all aspects of the patient's life. Positive intervention is required  81-100% Bed-bound  These patients are either bed-bound or exaggerating their symptoms  Bluford FORBES Zoe DELENA Karon DELENA, et al. Surgery versus conservative management of stable thoracolumbar fracture: the PRESTO feasibility RCT. [place] ([place]): [employer]; 2021 Nov. [facility] Technology Assessment, No. 25.62.) Appendix 3, Oswestry Disability Index category descriptors. Available from: Findjewelers.cz  Minimally Clinically Important Difference (MCID) = 12.8%       GOALS: Goals reviewed with patient? Yes  SHORT TERM GOALS: Target date: 09/25/2023  Pt will be independent with HEP for improved strength, pain, transfers. Baseline: none; 10/01/23: compliant Goal status: MET  2.  Pt will improve 5x sit<>stand to less than or equal to 30 sec to demonstrate improved functional strength and transfer efficiency. Baseline:  37.25 sec; 24.95 sec with B UE support 10/06/23 Goal status: MET 10/06/23  LONG TERM GOALS: Target date: 10/23/2023>UPDATED TARGET 12/04/2023  Pt will be independent with progression of HEP for improved pain, strength, transfers. Baseline: updated 10/7 Goal status: IN PROGRESS  2.  Pt will improve Modified Oswestry Score to less than or equal to 50%, to demo less pain interference in daily activities. Baseline: 64% Goal status: IN PROGRESS  3.  Pt will improve 5x sit<>stand to less than or equal to 20 sec to demonstrate improved functional strength and transfer efficiency. Baseline: 26 sec 10/22/2023 (improved from 37 sec) Goal status: IN PROGRESS, 10/22/2023  4.  Pt will rate pain in low back, decreased by at least 50%, for improved ADLs and gait activities. Baseline: much less pain, 10/22/2023  Goal status: MET, 10/22/2023  5. Patient to report tolerance for 30 minutes of standing/walking without device to return to PLOF.  Baseline: reports tolerating 30 min with RW; increased forward lean without device or with cane  Goal status: IN PROGRESS 10/22/2023  6.. Patient to demonstrate gait speed of at least 2.3 ft/sec in order to improve access to community.  Baseline: 1.9 ft/sec with RW; 2.09 ft/sec with RW 10/22/2023 Goal status: INITIAL    ASSESSMENT:  CLINICAL IMPRESSION: Pt presents today after several weeks of travelling out of town and schedule limitations at clinic when she was in town.  She has been working on step ups and dynamic balance activities along rail in hallway at [place].  Worked on postural strengthening, leg strengthening, and gait activities with cane/no device today, with focus on upright posture, looking at visual target to assist with posture with gait.  Pt does report not using cane a great deal since last visit, but concerned about posture when she does use cane.  Pt will continue to benefit from skilled PT towards goals for improved functional mobility and  decreased fall risk.  OBJECTIVE IMPAIRMENTS: Abnormal gait, decreased balance, decreased mobility, difficulty walking, decreased strength, impaired flexibility, postural dysfunction, and pain.   ACTIVITY LIMITATIONS: sitting, standing, sleeping, transfers, bed mobility, and locomotion level  PARTICIPATION LIMITATIONS: community activity  PERSONAL FACTORS: 3+ comorbidities: See PMH above are also affecting patient's functional outcome.   REHAB POTENTIAL: Good  CLINICAL DECISION MAKING: Evolving/moderate complexity  EVALUATION COMPLEXITY: Moderate  PLAN:  PT FREQUENCY: 1-2x/week  PT DURATION: 6 weeks  PLANNED INTERVENTIONS: [id number]- PT Re-evaluation, 97750- Physical Performance Testing, 97110-Therapeutic exercises, 97530- Therapeutic activity, [id number]- Neuromuscular re-education, 97535- Self Care, [id number]- Manual therapy, [id number]- Gait training, [phone]- Canalith repositioning, [id number]- Aquatic Therapy, [phone]- Electrical stimulation (manual), [phone] (1-2 muscles), [id number] (3+ muscles)- Dry Needling, Patient/Family education, Balance training, Stair training, Taping, Joint mobilization, Spinal mobilization, Vestibular training, DME instructions, Cryotherapy, and Moist heat  PLAN FOR NEXT SESSION:  Try interval training of activities- Progress postural strengthening; progress leg strength and balance.  Progress for abdominal and lumbar strengthening; Gait training on outdoor surfaces with cane.  (Have pt complete Oswestry*-did not do today due to answering pt/husband's questions) UPDATE/CONSOLIDATE HEP!    Greig Anon, PT 11/09/23 4:24 PM Phone: [phone] Fax: [phone]  [facility] at [facility] [address] [place], [address] [place], [place] [address] Phone # [phone] Fax # [phone]

## 2023-11-10 ENCOUNTER — Encounter: Payer: Self-pay | Admitting: Family Medicine

## 2023-11-10 ENCOUNTER — Ambulatory Visit (INDEPENDENT_AMBULATORY_CARE_PROVIDER_SITE_OTHER): Admitting: Family Medicine

## 2023-11-10 VITALS — BP 134/72 | HR 68 | Temp 97.9°F | Wt 88.0 lb

## 2023-11-10 DIAGNOSIS — M8589 Other specified disorders of bone density and structure, multiple sites: Secondary | ICD-10-CM

## 2023-11-10 DIAGNOSIS — I10 Essential (primary) hypertension: Secondary | ICD-10-CM

## 2023-11-10 DIAGNOSIS — R931 Abnormal findings on diagnostic imaging of heart and coronary circulation: Secondary | ICD-10-CM | POA: Diagnosis not present

## 2023-11-10 DIAGNOSIS — R5383 Other fatigue: Secondary | ICD-10-CM | POA: Diagnosis not present

## 2023-11-10 DIAGNOSIS — E034 Atrophy of thyroid (acquired): Secondary | ICD-10-CM

## 2023-11-10 DIAGNOSIS — Z79899 Other long term (current) drug therapy: Secondary | ICD-10-CM

## 2023-11-10 DIAGNOSIS — E785 Hyperlipidemia, unspecified: Secondary | ICD-10-CM | POA: Diagnosis not present

## 2023-11-10 LAB — CBC WITH DIFFERENTIAL/PLATELET
Basophils Absolute: 0 K/uL (ref 0.0–0.1)
Basophils Relative: 0.7 % (ref 0.0–3.0)
Eosinophils Absolute: 0.1 K/uL (ref 0.0–0.7)
Eosinophils Relative: 0.9 % (ref 0.0–5.0)
HCT: 35.3 % — ABNORMAL LOW (ref 36.0–46.0)
Hemoglobin: 12 g/dL (ref 12.0–15.0)
Lymphocytes Relative: 16.7 % (ref 12.0–46.0)
Lymphs Abs: 1 K/uL (ref 0.7–4.0)
MCHC: 34 g/dL (ref 30.0–36.0)
MCV: 94.1 fl (ref 78.0–100.0)
Monocytes Absolute: 0.6 K/uL (ref 0.1–1.0)
Monocytes Relative: 10.4 % (ref 3.0–12.0)
Neutro Abs: 4.2 K/uL (ref 1.4–7.7)
Neutrophils Relative %: 71.3 % (ref 43.0–77.0)
Platelets: 219 K/uL (ref 150.0–400.0)
RBC: 3.75 Mil/uL — ABNORMAL LOW (ref 3.87–5.11)
RDW: 13.2 % (ref 11.5–15.5)
WBC: 5.9 K/uL (ref 4.0–10.5)

## 2023-11-10 LAB — B12 AND FOLATE PANEL
Folate: >23.7 ng/mL (ref >5.9)
Vitamin B-12: 499 pg/mL (ref 211–911)

## 2023-11-10 LAB — LDL CHOLESTEROL, DIRECT: Direct LDL: 48 mg/dL

## 2023-11-10 LAB — VITAMIN D 25 HYDROXY (VIT D DEFICIENCY, FRACTURES): VITD: 66.83 ng/mL (ref 30.00–100.00)

## 2023-11-10 LAB — TSH: TSH: 1.03 u[IU]/mL (ref 0.35–5.50)

## 2023-11-10 LAB — T4, FREE: Free T4: 0.74 ng/dL (ref 0.60–1.60)

## 2023-11-10 MED ORDER — HYDROCHLOROTHIAZIDE 25 MG PO TABS: 25.0000 mg | ORAL_TABLET | Freq: Every day | ORAL | 3 refills | Status: AC

## 2023-11-10 MED ORDER — ROSUVASTATIN CALCIUM 40 MG PO TABS: 40.0000 mg | ORAL_TABLET | Freq: Every day | ORAL | 3 refills | Status: AC

## 2023-11-10 MED ORDER — VERAPAMIL HCL ER 120 MG PO TBCR: 120.0000 mg | EXTENDED_RELEASE_TABLET | Freq: Every day | ORAL | 1 refills | Status: DC

## 2023-11-10 NOTE — Progress Notes (Signed)
 Wendy Jackson , Mar 17, 1945, 78 y.o., female MRN: [id number] Patient Care Team    Relationship Specialty Notifications Start End  Catherine Charlies LABOR, DO PCP - General Family Medicine  12/11/20   Lavona Agent, MD PCP - Cardiology Cardiology  01/01/23   Whitfield Raisin, NP Nurse Practitioner Neurology  05/23/20   Alena Laverda MATSU, MD Referring Physician Ophthalmology  05/23/20   Dr. Philippa Attending Physician Rheumatology  12/10/20    Comment: [place], [place]  Cathlyn JAYSON Nikki Bobie FORBES, MD Consulting Physician Obstetrics and Gynecology  11/11/21     Chief Complaint  Patient presents with   Hypertension     Subjective: Wendy Jackson is a 78 y.o. Pt presents for Chronic Conditions/illness Management Medication reconciliation completed today Past medical history updated if appropriate.  Hypertension/hyperlipidemia/lower extremity edema: Pt reports compliance with verapamil  120 mg daily, HCTZ 25 mg daily,  valsartan  160 by cardiology.   Patient denies chest pain, shortness of breath, dizziness or lower extremity edema.   She is now established with cardiology.  Polyarthralgia/poss PMR: Now established with local rheumatology. Plaquenil  dose was cut in half. Prior note Patient has a history of rheumatological disease going back a few decades.  She had been prescribed low-dose steroid daily for some time and then switched over to Plaquenil .  She has a new rheumatologist now, and the diagnosis of lupus is in question.  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 Patient recently was seen by orthopedic for hip discomfort and given a prednisone  taper.  She reports the swelling in her feet had also completely resolved along with other muscle skeletal complaints with use of steroid. Today she would like a referral to a local rheumatologist to discuss their opinion on her rheumatological condition, evaluation and treatment     11/10/2023    1:05 PM 06/24/2023     3:59 PM 05/12/2023    1:12 PM 09/02/2022    8:42 AM 06/23/2022    2:30 PM  Depression screen PHQ 2/9  Decreased Interest 1 1 0 0 0  Down, Depressed, Hopeless 1 1 0 0 0  PHQ - 2 Score 2 2 0 0 0  Altered sleeping 3 2     Tired, decreased energy 3 3     Change in appetite 3 0     Feeling bad or failure about yourself  1 0     Trouble concentrating 1 0     Moving slowly or fidgety/restless 3 0     Suicidal thoughts 0 0     PHQ-9 Score 16 7     Difficult doing work/chores Extremely dIfficult Somewhat difficult       Allergies  Allergen Reactions   Amlodipine  Other (See Comments)    Bilateral lower extremity edema   Augmentin [Amoxicillin-Pot Clavulanate] Other (See Comments)    unk   Tramadol     Nausea    Flexeril [Cyclobenzaprine] Palpitations   Social History   Social History Narrative   Marital status/children/pets: Married.  2 children.   Education/employment: Retired.  College-educated.   Safety:      -smoke alarm in the home:Yes     - wears seatbelt: Yes     - Feels safe in their relationships: Yes      Past Medical History:  Diagnosis Date   Cataract 03/2019   Glaucoma Approximately 2005   [place] retinal tear of left eye 04/02/2021   Hyperlipidemia LDL goal <70 01/11/2021  Hypertension 2022   Long-term use of Plaquenil  04/02/2021   Lupus    new rheum questioning original dx. continuing Plaquenil  at lower dose for now.   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 bladder, acquired    Pseudophakia of both eyes 04/02/2021   Retinal detachment with multiple breaks, right eye 04/02/2021   Stroke (HCC) 04/2020   Thyroid  disease    Tibial plateau fracture, right 2025   Past Surgical History:  Procedure Laterality Date   ENDARTERECTOMY Right 05/02/2020   Procedure: RIGHT CAROTID ENDARTERECTOMY;  Surgeon: Oris Krystal FALCON, MD;  Location: [facility] OR;  Service: Vascular;  Laterality: Right;   EYE SURGERY  01/2020   LEFT HEART CATH  AND CORONARY ANGIOGRAPHY N/A 02/03/2023   Procedure: LEFT HEART CATH AND CORONARY ANGIOGRAPHY;  Surgeon: Verlin Lonni BIRCH, MD;  Location: [facility] INVASIVE CV LAB;  Service: Cardiovascular;  Laterality: N/A;   SCLERAL BUCKLE Right 2004   TUBAL LIGATION  Feb 1982   Family History  Problem Relation Age of Onset   ALS Mother    Early death Mother    Dementia Father    Hypertension Father    Macular degeneration Maternal Aunt    Alcohol abuse Paternal Uncle    Alcohol abuse Paternal Uncle    Allergies as of 11/10/2023       Reactions   Amlodipine  Other (See Comments)   Bilateral lower extremity edema   Augmentin [amoxicillin-pot Clavulanate] Other (See Comments)   unk   Tramadol    Nausea   Flexeril [cyclobenzaprine] Palpitations        Medication List        Accurate as of November 10, 2023  1:41 PM. If you have any questions, ask your nurse or doctor.          Airborne Elderberry 100-50 MG Chew Chew 1 tablet by mouth in the morning and at bedtime.   Armour Thyroid  30 MG tablet Generic drug: thyroid  Take 1 tablet (30 mg total) by mouth daily.   aspirin  EC 81 MG tablet Take 1 tablet (81 mg total) by mouth daily. Swallow whole.   cetirizine 10 MG tablet Commonly known as: ZYRTEC Take 10 mg by mouth See admin instructions. Every 36 hours   estradiol  0.1 MG/GM vaginal cream Commonly known as: ESTRACE  Use 1/2 gram three times per week at night.   furosemide  40 MG tablet Commonly known as: LASIX  Take 40 mg by mouth daily as needed for fluid or edema.   hydrochlorothiazide  25 MG tablet Commonly known as: HYDRODIURIL  Take 1 tablet (25 mg total) by mouth daily.   hydroxychloroquine  200 MG tablet Commonly known as: PLAQUENIL  Take 200 mg by mouth every other day.   latanoprost 0.005 % ophthalmic solution Commonly known as: XALATAN Place 1 drop into the left eye at bedtime.   pantoprazole  40 MG tablet Commonly known as: PROTONIX  Take 1 tablet (40 mg total)  by mouth daily.   Resveratrol 50 MG Caps Take 50 mg by mouth daily.   rosuvastatin  40 MG tablet Commonly known as: Crestor  Take 1 tablet (40 mg total) by mouth daily.   Simbrinza 1-0.2 % Susp Generic drug: Brinzolamide-Brimonidine Place 1 drop into the left eye 3 (three) times daily.   Ubiquinol 100 MG Caps Take 100 mg by mouth.   valsartan  160 MG tablet Commonly known as: Diovan  Take 1 tablet (160 mg total) by mouth 2 (two) times daily.   verapamil  120  MG CR tablet Commonly known as: CALAN -SR Take 1 tablet (120 mg total) by mouth at bedtime.   XLEAR SINUS CARE SPRAY NA Place 4 sprays into the nose daily as needed (Moisturing).        All past medical history, surgical history, allergies, family history, immunizations andmedications were updated in the EMR today and reviewed under the history and medication portions of their EMR.     Review of Systems  All other systems reviewed and are negative.  Negative, with the exception of above mentioned in HPI   Objective:  BP 134/72   Pulse 68   Temp 97.9 F (36.6 C)   Wt 88 lb (39.9 kg)   SpO2 98%   BMI 16.63 kg/m  Body mass index is 16.63 kg/m. Physical Exam Vitals and nursing note reviewed.  Constitutional:      General: She is not in acute distress.    Appearance: Normal appearance. She is not ill-appearing, toxic-appearing or diaphoretic.  HENT:     Head: Normocephalic and atraumatic.  Eyes:     General: No scleral icterus.       Right eye: No discharge.        Left eye: No discharge.     Extraocular Movements: Extraocular movements intact.     Conjunctiva/sclera: Conjunctivae normal.     Pupils: Pupils are equal, round, and reactive to light.  Cardiovascular:     Rate and Rhythm: Normal rate and regular rhythm.     Heart sounds: No murmur heard. Pulmonary:     Effort: Pulmonary effort is normal. No respiratory distress.     Breath sounds: Normal breath sounds. No wheezing, rhonchi or rales.   Musculoskeletal:     Right lower leg: No edema.     Left lower leg: No edema.  Skin:    General: Skin is warm.     Findings: No rash.  Neurological:     Mental Status: She is alert and oriented to person, place, and time. Mental status is at baseline.     Motor: No weakness.     Gait: Gait normal.  Psychiatric:        Mood and Affect: Mood normal.        Behavior: Behavior normal.        Thought Content: Thought content normal.        Judgment: Judgment normal.     No results found. No results found.  No results found for this or any previous visit (from the past 24 hours).  Assessment/Plan: Kennedi Lizardo is a 78 y.o. female present for OV for Chronic Conditions/illness Management Primary hypertension/LE edema/HLD Borderline.  Has cardiology follow-up scheduled.  They are working on blood pressure control and have recently changed ARB.  Return to steroid use likely bumped up blood pressure some. Continue valsartan  160 mg bid Continue hydrochlorothiazide  25 mg qd Will use lasix  in place of hydrochlorothiazide  if needed only for edema.  Continue verapamil  120 mg every day> higher doses caused lower extremity edema. Now established with cardio, Dr. Lavona.  Coronary calcium  score: 1525, 96th percentile Continue Crestor  40 mg  Polyarthralgia/PMR established with rheumatology decreased Plaquenil  started steroid Patient now established with local rheumatology - Plaquenil  dose was reduced to every other day - no  longer taking steroids.  - working with PT  Hypothyroidism: TSH, T4 free collected.  Thyroid  med will be refilled after results received.   Fatigue: Pt complaining of more fatigue of recent.  B12, folate, vit d and  iron collected today Could be secondary to AA d/o    Reviewed expectations re: course of current medical issues. Discussed self-management of symptoms. Outlined signs and symptoms indicating need for more acute intervention. Patient verbalized  understanding and all questions were answered. Patient received an After-Visit Summary.  Return in about 24 weeks (around 04/26/2024).   Orders Placed This Encounter  Procedures   DG Bone Density   Direct LDL   TSH   T4, free   CBC w/Diff   Vitamin D  (25 hydroxy)   B12 and Folate Panel   Iron, TIBC and Ferritin Panel   Meds ordered this encounter  Medications   verapamil  (CALAN -SR) 120 MG CR tablet    Sig: Take 1 tablet (120 mg total) by mouth at bedtime.    Dispense:  90 tablet    Refill:  1   hydrochlorothiazide  (HYDRODIURIL ) 25 MG tablet    Sig: Take 1 tablet (25 mg total) by mouth daily.    Dispense:  90 tablet    Refill:  3   rosuvastatin  (CRESTOR ) 40 MG tablet    Sig: Take 1 tablet (40 mg total) by mouth daily.    Dispense:  90 tablet    Refill:  3   Referral Orders  No referral(s) requested today      Note is dictated utilizing voice recognition software. Although note has been proof read prior to signing, occasional typographical errors still can be missed. If any questions arise, please do not hesitate to call for verification.   electronically signed by:  Charlies Bellini, DO  [facility] [place]

## 2023-11-10 NOTE — Patient Instructions (Addendum)

## 2023-11-11 ENCOUNTER — Encounter: Payer: Self-pay | Admitting: Physical Therapy

## 2023-11-11 ENCOUNTER — Ambulatory Visit: Admitting: Physical Therapy

## 2023-11-11 DIAGNOSIS — M5459 Other low back pain: Secondary | ICD-10-CM | POA: Diagnosis not present

## 2023-11-11 DIAGNOSIS — R2681 Unsteadiness on feet: Secondary | ICD-10-CM

## 2023-11-11 DIAGNOSIS — M25561 Pain in right knee: Secondary | ICD-10-CM | POA: Diagnosis not present

## 2023-11-11 DIAGNOSIS — R293 Abnormal posture: Secondary | ICD-10-CM | POA: Diagnosis not present

## 2023-11-11 DIAGNOSIS — R262 Difficulty in walking, not elsewhere classified: Secondary | ICD-10-CM | POA: Diagnosis not present

## 2023-11-11 DIAGNOSIS — M6281 Muscle weakness (generalized): Secondary | ICD-10-CM | POA: Diagnosis not present

## 2023-11-11 LAB — IRON,TIBC AND FERRITIN PANEL
%SAT: 17 % (ref 16–45)
Ferritin: 36 ng/mL (ref 16–288)
Iron: 72 ug/dL (ref 45–160)
TIBC: 430 ug/dL (ref 250–450)

## 2023-11-11 NOTE — Therapy (Signed)
 OUTPATIENT PHYSICAL THERAPY NEURO TREATMENT NOTE   Patient Name: Wendy Jackson MRN: [id number] DOB:12-03-1945, 78 y.o., female Today's Date: 11/11/2023   PCP: Catherine Charlies LABOR, DO REFERRING PROVIDER: Persons, Ronal Dragon, [place]  (Additional referral from Jerri Kay HERO, MD for [id number] (ICD-10-CM) - Acute pain of right knee M54.50 (ICD-10-CM) - Acute bilateral low back pain without sciatica)       END OF SESSION:  PT End of Session - 11/11/23 1220     Visit Number 11    Number of Visits 21    Date for Recertification  12/04/23    Authorization Type [employer] medicare    Progress Note Due on Visit 19    PT Start Time 1228    PT Stop Time 1318    PT Time Calculation (min) 50 min    Equipment Utilized During Treatment Gait belt    Activity Tolerance Patient tolerated treatment well    Behavior During Therapy [facility] for tasks assessed/performed                Past Medical History:  Diagnosis Date   Cataract 03/2019   Glaucoma Approximately 2005   [place] retinal tear of left eye 04/02/2021   Hyperlipidemia LDL goal <70 01/11/2021   Hypertension 2022   Long-term use of Plaquenil  04/02/2021   Lupus    new rheum questioning original dx. continuing Plaquenil  at lower dose for now.   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 bladder, acquired    Pseudophakia of both eyes 04/02/2021   Retinal detachment with multiple breaks, right eye 04/02/2021   Stroke (HCC) 04/2020   Thyroid  disease    Tibial plateau fracture, right 2025   Past Surgical History:  Procedure Laterality Date   ENDARTERECTOMY Right 05/02/2020   Procedure: RIGHT CAROTID ENDARTERECTOMY;  Surgeon: Oris Krystal FALCON, MD;  Location: [facility] OR;  Service: Vascular;  Laterality: Right;   EYE SURGERY  01/2020   LEFT HEART CATH AND CORONARY ANGIOGRAPHY N/A 02/03/2023   Procedure: LEFT HEART CATH AND CORONARY ANGIOGRAPHY;  Surgeon: Verlin Lonni BIRCH, MD;  Location: [facility] INVASIVE CV LAB;   Service: Cardiovascular;  Laterality: N/A;   SCLERAL BUCKLE Right 2004   TUBAL LIGATION  Feb 1982   Patient Active Problem List   Diagnosis Date Noted   Incontinence of feces with fecal urgency 05/05/2023   Gastroesophageal reflux disease with esophagitis without hemorrhage 05/05/2023   Bloating 05/05/2023   Other specified abnormal immunological findings in serum 04/03/2023   Elevated erythrocyte sedimentation rate 04/03/2023   High coronary artery calcium  score: 1525; 96th percentile 01/29/2023   SVT (supraventricular tachycardia) 12/31/2022   Decreased exercise tolerance 12/31/2022   Polymyalgia rheumatica 11/25/2022   Other secondary scoliosis, lumbar region 11/17/2022   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   Other female genital prolapse 10/29/2021   Long-term use of Plaquenil  04/02/2021   Primary open angle glaucoma (POAG) of both eyes, severe stage 04/02/2021   Primary hypertension 01/11/2021   Hyperlipidemia LDL goal <70 01/11/2021   Acquired thrombophilia 12/11/2020   BMI less than 19,adult 12/11/2020   Hypothyroidism 12/11/2020   History of CVA (cerebrovascular accident) 05/02/2020   Allergic rhinitis    CAD (coronary artery disease) 05/20/2018    ONSET DATE: 09/04/2023 (MD referral)  REFERRING DIAG: M54.50 (ICD-10-CM) - Acute bilateral low back pain without sciatica  New referral in patient's chart from Dr. Jerri stating Right tibial plateau fracture. WBAT. Knee rehab. Strengthening. Gait training.  THERAPY DIAG:  Abnormal posture  Muscle weakness (generalized)  Unsteadiness on feet  Rationale for Evaluation and Treatment: Rehabilitation  SUBJECTIVE:                                                                                                                                                                                             SUBJECTIVE STATEMENT: Used the Nustep several times, 17 minutes, then 7 minutes last night.  Was a little sore, but okay and did the  hallway exercises. Pt accompanied by: self and significant other  PERTINENT HISTORY: followed by Dr. Jerri for a lateral nondisplaced tibial plateau fracture.  She has been having to use a walker and bear minimal weight.  She does have a history of significant degenerative scoliosis and degenerative changes for which she had seen Dr. Barbarann in the past. Also hx of IBS, fecal leakage, has had pelvic floor therapy; polymalgia rheumatica   PAIN:  Are you having pain? No  PRECAUTIONS: Fall and Other: 10/01/23 pt reports she is now full weight bearing on R  RED FLAGS: None   WEIGHT BEARING RESTRICTIONS: None  FALLS: Has patient fallen in last 6 months? Yes. Number of falls 1; 3 falls in past 13 months (all have been tripping)  LIVING ENVIRONMENT: Lives with: lives with their spouse Lives in: House/apartment [place] Stairs: No Has following equipment at home: Single point cane, Environmental Consultant - 2 wheeled, and single leg scooter (unable to use)  PLOF: Independent  PATIENT GOALS: To get back less tight and painful  OBJECTIVE:      TODAY'S TREATMENT: 11/11/2023 Activity Comments  Postural/core stability exercises: -Review of supine pelvic tilts and lumbar rotation -Bridging x 10, then with ball between knees x 10 -hooklying march with abdominal activation -dead bug 2 x 5 reps    Good form  Cues for abdominal activation and good form; pt performs well  Lower extremity strengthening: Standing 3- way hip kicks -abduction x 10 -extension x 10 -flexion x 10 Started with red band hip abduction-way too much trunk motion, so went to no band, cues for abdominal activation and upright posture-performed well no resistance  Gait training with SPC, 100 + feet, several bouts, including turns, changes of directions around furniture Several times of slowed pace, PT provides supervision  Gait negotiating figure-8s around obstacles, then stepping over obstacles with SPC No LOB, PT provides supervision   Simulating curb negotiation on 8 aerobic step, 5 reps with CGA  Cues for sequence, with [facility]      Access Code: [email or web address] URL: [email or web address] Date: 11/11/2023 Prepared by: [facility] - Outpatient  Rehab - [facility]  Exercises - Supine Pelvic Tilt  - 1-2 x daily - 7 x weekly - 1-2 sets - 5-10 reps - Lower Trunk Rotations  - 1 x daily - 7 x weekly - 2 sets - 10 reps - Supine Scapular Retraction  - 1 x daily - 7 x weekly - 2 sets - 10 reps - Seated Hamstring Stretch  - 1 x daily - 7 x weekly - 2 sets - 30 sec hold - Standing Scapular Retraction  - 1 x daily - 7 x weekly - 3 sets - 10 reps - Seated Long Arc Quad  - 1 x daily - 7 x weekly - 2 sets - 10 reps - Backward Walking with Counter Support  - 1 x daily - 7 x weekly - 1 sets - 3-5 reps - Forward Step Up  - 1 x daily - 7 x weekly - 3 sets - 5 reps - Dead Bug  - 1 x daily - 7 x weekly - 1 sets - 10 reps - Supine Bridge  - 1 x daily - 7 x weekly - 2-3 sets - 10 reps - Side Stepping with Resistance at Ankles and Counter Support  - 1 x daily - 7 x weekly - 1 sets - 3 reps - Standing 3-Way Leg Reach with Resistance at Ankles and Counter Support  - 1 x daily - 7 x weekly - 3 sets - 10 reps   PATIENT EDUCATION: Education details: Consolidated/updated HEP; recommendation to use cane indoor surfaces with husband supervision; explained muscle soreness expected after exercise Person educated: Patient and Spouse Education method: Explanation, Demonstration, Tactile cues, Verbal cues, and Handouts Education comprehension: verbalized understanding and returned demonstration     Note: Objective measures were completed at Evaluation unless otherwise noted.  DIAGNOSTIC FINDINGS: No acute findings per pt/husband report-curvature and severe arthritis  COGNITION: Overall cognitive status: Within functional limits for tasks assessed   SENSATION: Not tested  MUSCLE LENGTH: Hamstrings: Right NT deg; Left -30  deg   LOWER EXTREMITY MMT:     Active (in sitting) Right 9/16 Left 9/16  Hip flexion 4+ 4  Hip extension    Hip abduction 4 4  Hip adduction 4+ 4+  Hip internal rotation    Hip external rotation    Knee flexion 4+ 4+  Knee extension 4+ 5  Ankle dorsiflexion 4 4  Ankle plantarflexion 4+ 4+  Ankle inversion    Ankle eversion     (Blank rows = not tested)  LOWER EXTREMITY ROM:    ROM Right 9/16 Left 9/16  Knee flexion 134 135  Knee extension 3 0  (Blank rows = not tested)  POSTURE: rounded shoulders, forward head, increased thoracic kyphosis, weight shift left, and L shoulder lower than R   PALPATION:  Tender along lumbar paraspinals bilaterally  BED MOBILITY:  Findings: Tends to go long sit<>supine with pain upon supine; instructed in log roll technique:  supine>L side>sit with min assist and cues  TRANSFERS: Sit to stand: Modified independence  Assistive device utilized: BUE support     Stand to sit: Modified independence  Assistive device utilized: BUE support      GAIT: Findings: Gait Characteristics: per MD note-minimal RLE weightbearing, step to pattern, step through pattern, and decreased stance time- Right, Distance walked: 20 ft, Assistive device utilized:Walker - 2 wheeled, Level of assistance: SBA, and Comments: antalgic pattern  FUNCTIONAL TESTS:  5 times sit to stand: 37.25 sec with BUE support  PATIENT SURVEYS:  Modified Oswestry:  MODIFIED OSWESTRY DISABILITY SCALE  Date: 09/10/2023 Score  Pain intensity 4 =  Pain medication provides me with little relief from pain.  2. Personal care (washing, dressing, etc.) 2 =  It is painful to take care of myself, and I am slow and careful.  3. Lifting 5 =  I cannot lift or carry anything at all.  4. Walking 4 = I can only walk with crutches or a cane.  5. Sitting 3 =  Pain prevents me from sitting more than  hour.  6. Standing 3 =  Pain prevents me from standing more than 1/2 hour.  7. Sleeping 1 = I can  sleep well only by using pain medication.  8. Social Life 5 =  I have hardly any social life because of my pain.  9. Traveling 2 =  My pain restricts my travel over 2 hours.  10. Employment/ Homemaking 3 = Pain prevents me from doing anything but light duties.  Total 32/50= 64%   Interpretation of scores: Score Category Description  0-20% Minimal Disability The patient can cope with most living activities. Usually no treatment is indicated apart from advice on lifting, sitting and exercise  21-40% Moderate Disability The patient experiences more pain and difficulty with sitting, lifting and standing. Travel and social life are more difficult and they may be disabled from work. Personal care, sexual activity and sleeping are not grossly affected, and the patient can usually be managed by conservative means  41-60% Severe Disability Pain remains the main problem in this group, but activities of daily living are affected. These patients require a detailed investigation  61-80% Crippled Back pain impinges on all aspects of the patient's life. Positive intervention is required  81-100% Bed-bound  These patients are either bed-bound or exaggerating their symptoms  Bluford FORBES Zoe DELENA Karon DELENA, et al. Surgery versus conservative management of stable thoracolumbar fracture: the PRESTO feasibility RCT. [place]): [employer]; 2021 Nov. [facility] Technology Assessment, No. 25.62.) Appendix 3, Oswestry Disability Index category descriptors. Available from: Findjewelers.cz  Minimally Clinically Important Difference (MCID) = 12.8%       GOALS: Goals reviewed with patient? Yes  SHORT TERM GOALS: Target date: 09/25/2023  Pt will be independent with HEP for improved strength, pain, transfers. Baseline: none; 10/01/23: compliant Goal status: MET  2.  Pt will improve 5x sit<>stand to less than or equal to 30 sec to demonstrate improved functional strength and  transfer efficiency. Baseline: 37.25 sec; 24.95 sec with B UE support 10/06/23 Goal status: MET 10/06/23  LONG TERM GOALS: Target date: 10/23/2023>UPDATED TARGET 12/04/2023  Pt will be independent with progression of HEP for improved pain, strength, transfers. Baseline: updated 10/7 Goal status: IN PROGRESS  2.  Pt will improve Modified Oswestry Score to less than or equal to 50%, to demo less pain interference in daily activities. Baseline: 64% Goal status: IN PROGRESS  3.  Pt will improve 5x sit<>stand to less than or equal to 20 sec to demonstrate improved functional strength and transfer efficiency. Baseline: 26 sec 10/22/2023 (improved from 37 sec) Goal status: IN PROGRESS, 10/22/2023  4.  Pt will rate pain in low back, decreased by at least 50%, for improved ADLs and gait activities. Baseline: much less pain, 10/22/2023 Goal status: MET, 10/22/2023  5. Patient to report tolerance for 30 minutes of standing/walking without device to return to PLOF.  Baseline: reports tolerating 30 min with RW; increased forward lean without device or with cane  Goal status: IN PROGRESS 10/22/2023  6.. Patient to demonstrate gait speed of at least 2.3 ft/sec in order to improve access to community.  Baseline: 1.9 ft/sec with RW; 2.09 ft/sec with RW 10/22/2023 Goal status: INITIAL    ASSESSMENT:  CLINICAL IMPRESSION: Pt presents today with reports of some muscle soreness after doing two bouts of Nustep since last PT session; pain sounds muscular in nature and to be expected after not having used consistently for some time.  Skilled PT session focused on progressing/consolidating HEP, including exercises for posture and core stability, lower extremity strength, and gait training with cane. Pt needs cues for abdominal activation and posture with standing exercises; attempted use of band for hip kicks for resistance and pt has too much trunk motion to continue.  She does much better with no resistance; we  were able to add resistance to sidestepping, which she did well.  Gait with cane today supervision overall except for curb negotiation, so educated pt/husband to progress with cane use indoor surfaces at [place].  She will continue to benefit from skilled PT towards goals for improved functional mobility and decreased fall risk.  OBJECTIVE IMPAIRMENTS: Abnormal gait, decreased balance, decreased mobility, difficulty walking, decreased strength, impaired flexibility, postural dysfunction, and pain.   ACTIVITY LIMITATIONS: sitting, standing, sleeping, transfers, bed mobility, and locomotion level  PARTICIPATION LIMITATIONS: community activity  PERSONAL FACTORS: 3+ comorbidities: See PMH above are also affecting patient's functional outcome.   REHAB POTENTIAL: Good  CLINICAL DECISION MAKING: Evolving/moderate complexity  EVALUATION COMPLEXITY: Moderate  PLAN:  PT FREQUENCY: 1-2x/week  PT DURATION: 6 weeks  PLANNED INTERVENTIONS: 97164- PT Re-evaluation, 97750- Physical Performance Testing, 97110-Therapeutic exercises, 97530- Therapeutic activity, [id number]- Neuromuscular re-education, 97535- Self Care, [id number]- Manual therapy, [id number]- Gait training, [phone]- Canalith repositioning, [id number]- Aquatic Therapy, [phone]- Electrical stimulation (manual), [phone] (1-2 muscles), [id number] (3+ muscles)- Dry Needling, Patient/Family education, Balance training, Stair training, Taping, Joint mobilization, Spinal mobilization, Vestibular training, DME instructions, Cryotherapy, and Moist heat  PLAN FOR NEXT SESSION:  Continue to try interval training of activities- Progress postural strengthening; progress leg strength and balance.  Progress for abdominal and lumbar strengthening; Gait training on outdoor surfaces with cane.  (Have pt complete Oswestry*-did not do today due to answering pt/husband's questions) Review updates to [employer], PT 11/11/23 1:18 PM Phone: [phone] Fax: [phone]  [facility]  [facility] Outpatient Rehab at [facility] [address], [address] [place], [place] [address] Phone # [phone] Fax # [phone]

## 2023-11-12 ENCOUNTER — Ambulatory Visit: Payer: Self-pay | Admitting: Family Medicine

## 2023-11-12 DIAGNOSIS — E039 Hypothyroidism, unspecified: Secondary | ICD-10-CM

## 2023-11-12 MED ORDER — ARMOUR THYROID 30 MG PO TABS: 30.0000 mg | ORAL_TABLET | Freq: Every day | ORAL | 4 refills | Status: AC

## 2023-11-16 ENCOUNTER — Encounter: Payer: Self-pay | Admitting: Radiology

## 2023-11-17 ENCOUNTER — Encounter: Payer: Self-pay | Admitting: Physical Therapy

## 2023-11-17 ENCOUNTER — Encounter: Payer: Self-pay | Admitting: Family Medicine

## 2023-11-17 ENCOUNTER — Ambulatory Visit: Attending: Physician Assistant | Admitting: Physical Therapy

## 2023-11-17 DIAGNOSIS — R293 Abnormal posture: Secondary | ICD-10-CM | POA: Insufficient documentation

## 2023-11-17 DIAGNOSIS — R2681 Unsteadiness on feet: Secondary | ICD-10-CM | POA: Insufficient documentation

## 2023-11-17 DIAGNOSIS — M6281 Muscle weakness (generalized): Secondary | ICD-10-CM | POA: Diagnosis not present

## 2023-11-17 NOTE — Therapy (Signed)
 OUTPATIENT PHYSICAL THERAPY NEURO TREATMENT NOTE   Patient Name: Wendy Jackson MRN: [id number] DOB:[birth year], 78 y.o., female Today's Date: 11/17/2023   PCP: Catherine Charlies LABOR, DO REFERRING PROVIDER: Persons, Ronal Dragon, [place]  (Additional referral from Jerri Kay HERO, MD for [phone] (ICD-10-CM) - Acute pain of right knee M54.50 (ICD-10-CM) - Acute bilateral low back pain without sciatica)       END OF SESSION:  PT End of Session - 11/17/23 1221     Visit Number 12    Number of Visits 21    Date for Recertification  12/04/23    Authorization Type [employer] medicare    Progress Note Due on Visit 19    PT Start Time 1228    PT Stop Time 1319    PT Time Calculation (min) 51 min    Equipment Utilized During Treatment Gait belt    Activity Tolerance Patient tolerated treatment well    Behavior During Therapy WFL for tasks assessed/performed                 Past Medical History:  Diagnosis Date   Cataract 03/2019   Glaucoma Approximately 2005   [place] retinal tear of left eye 04/02/2021   Hyperlipidemia LDL goal <70 01/11/2021   Hypertension 2022   Long-term use of Plaquenil  04/02/2021   Lupus    new rheum questioning original dx. continuing Plaquenil  at lower dose for now.   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 bladder, acquired    Pseudophakia of both eyes 04/02/2021   Retinal detachment with multiple breaks, right eye 04/02/2021   Stroke (HCC) 04/2020   Thyroid  disease    Tibial plateau fracture, right 2025   Past Surgical History:  Procedure Laterality Date   ENDARTERECTOMY Right 05/02/2020   Procedure: RIGHT CAROTID ENDARTERECTOMY;  Surgeon: Oris Krystal FALCON, MD;  Location: [facility] OR;  Service: Vascular;  Laterality: Right;   EYE SURGERY  01/2020   LEFT HEART CATH AND CORONARY ANGIOGRAPHY N/A 02/03/2023   Procedure: LEFT HEART CATH AND CORONARY ANGIOGRAPHY;  Surgeon: Verlin Lonni BIRCH, MD;  Location: [facility] INVASIVE CV LAB;   Service: Cardiovascular;  Laterality: N/A;   SCLERAL BUCKLE Right 2004   TUBAL LIGATION  Feb 1982   Patient Active Problem List   Diagnosis Date Noted   Incontinence of feces with fecal urgency 05/05/2023   Gastroesophageal reflux disease with esophagitis without hemorrhage 05/05/2023   Bloating 05/05/2023   Other specified abnormal immunological findings in serum 04/03/2023   Elevated erythrocyte sedimentation rate 04/03/2023   High coronary artery calcium  score: 1525; 96th percentile 01/29/2023   SVT (supraventricular tachycardia) 12/31/2022   Decreased exercise tolerance 12/31/2022   Polymyalgia rheumatica 11/25/2022   Other secondary scoliosis, lumbar region 11/17/2022   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   Other female genital prolapse 10/29/2021   Long-term use of Plaquenil  04/02/2021   Primary open angle glaucoma (POAG) of both eyes, severe stage 04/02/2021   Primary hypertension 01/11/2021   Hyperlipidemia LDL goal <70 01/11/2021   Acquired thrombophilia 12/11/2020   BMI less than 19,adult 12/11/2020   Hypothyroidism 12/11/2020   History of CVA (cerebrovascular accident) 05/02/2020   Allergic rhinitis    CAD (coronary artery disease) 05/20/2018    ONSET DATE: 09/04/2023 (MD referral)  REFERRING DIAG: M54.50 (ICD-10-CM) - Acute bilateral low back pain without sciatica  New referral in patient's chart from Dr. Jerri stating Right tibial plateau fracture. WBAT. Knee rehab. Strengthening. Gait training.  THERAPY DIAG:  Abnormal posture  Muscle weakness (generalized)  Unsteadiness on feet  Rationale for Evaluation and Treatment: Rehabilitation  SUBJECTIVE:                                                                                                                                                                                             SUBJECTIVE STATEMENT: Been doing all the exercises you've given me.  Have done the Nustep, twice x 16 minutes.  I think I feel  stronger.   Pt accompanied by: self and significant other  PERTINENT HISTORY: followed by Dr. Jerri for a lateral nondisplaced tibial plateau fracture.  She has been having to use a walker and bear minimal weight.  She does have a history of significant degenerative scoliosis and degenerative changes for which she had seen Dr. Barbarann in the past. Also hx of IBS, fecal leakage, has had pelvic floor therapy; polymalgia rheumatica   PAIN:  Are you having pain? No  PRECAUTIONS: Fall and Other: 10/01/23 pt reports she is now full weight bearing on R  RED FLAGS: None   WEIGHT BEARING RESTRICTIONS: None  FALLS: Has patient fallen in last 6 months? Yes. Number of falls 1; 3 falls in past 13 months (all have been tripping)  LIVING ENVIRONMENT: Lives with: lives with their spouse Lives in: House/apartment [place] Stairs: No Has following equipment at home: Single point cane, Environmental Consultant - 2 wheeled, and single leg scooter (unable to use)  PLOF: Independent  PATIENT GOALS: To get back less tight and painful  OBJECTIVE:       TODAY'S TREATMENT: 11/17/2023 Activity Comments  Postural/core stability exercises: -Bridging x 10, then with ball between knees x 10, 3 sec hold -Bridging with heel digs x 10 -hooklying march with abdominal activation -dead bug 2 x 5 reps -dead bug 2 x 5 reps with 2# in BUE    Good form  Cues for abdominal activation and good form; pt performs well  Good form  Lower extremity strengthening: Standing 3- way hip kicks -abduction 2 x 10 -extension 2 x 10 -flexion x 10 -marching x 10 Started with 2# BLE,  cues for abdominal activation and upright posture-better today with ankle weight  Minisquats to up on toes x 10 Minisquats to up on toes with overhead lift with 2# weight Off balance with up on toes   Gait training with cane, and quad tip base, 50 ft x 2, then no device, head turns/head nods Several times of slowed pace and veering with head motions, PT  provides supervision  Forward step up/up and down/down with 1 UE support/holding 2# weight  1 UE, then no UE support holding weight in BUEs Min guard  Gait with no device, weighted carry, 2# Cues to avoid posterior lean in trunk  Gait training on short distance outdoor surface to car, including sidewalk and stepping down curb With clinic cane at appropriate height, supervision/min guard   Access Code: [email or web address] URL: [email or web address] Date: 11/11/2023 Prepared by: [facility] - Outpatient  Rehab - [facility]  Exercises - Supine Pelvic Tilt  - 1-2 x daily - 7 x weekly - 1-2 sets - 5-10 reps - Lower Trunk Rotations  - 1 x daily - 7 x weekly - 2 sets - 10 reps - Supine Scapular Retraction  - 1 x daily - 7 x weekly - 2 sets - 10 reps - Seated Hamstring Stretch  - 1 x daily - 7 x weekly - 2 sets - 30 sec hold - Standing Scapular Retraction  - 1 x daily - 7 x weekly - 3 sets - 10 reps - Seated Long Arc Quad  - 1 x daily - 7 x weekly - 2 sets - 10 reps - Backward Walking with Counter Support  - 1 x daily - 7 x weekly - 1 sets - 3-5 reps - Forward Step Up  - 1 x daily - 7 x weekly - 3 sets - 5 reps - Dead Bug  - 1 x daily - 7 x weekly - 1 sets - 10 reps - Supine Bridge  - 1 x daily - 7 x weekly - 2-3 sets - 10 reps - Side Stepping with Resistance at Ankles and Counter Support  - 1 x daily - 7 x weekly - 1 sets - 3 reps - Standing 3-Way Leg Reach with Resistance (2#) at Ankles and Counter Support  - 1 x daily - 7 x weekly - 3 sets - 10 reps   PATIENT EDUCATION: Education details: Discussed type of cane for home (hers is too tall), upgrade HEP to 2# weight at ankles for hip kicks; try to walk into PT clinic next visit with cane Person educated: Patient and Spouse Education method: Explanation, Demonstration, Tactile cues, Verbal cues, and Handouts Education comprehension: verbalized understanding and returned demonstration     Note: Objective measures were completed at  Evaluation unless otherwise noted.  DIAGNOSTIC FINDINGS: No acute findings per pt/husband report-curvature and severe arthritis  COGNITION: Overall cognitive status: Within functional limits for tasks assessed   SENSATION: Not tested  MUSCLE LENGTH: Hamstrings: Right NT deg; Left -30 deg   LOWER EXTREMITY MMT:     Active (in sitting) Right 9/16 Left 9/16  Hip flexion 4+ 4  Hip extension    Hip abduction 4 4  Hip adduction 4+ 4+  Hip internal rotation    Hip external rotation    Knee flexion 4+ 4+  Knee extension 4+ 5  Ankle dorsiflexion 4 4  Ankle plantarflexion 4+ 4+  Ankle inversion    Ankle eversion     (Blank rows = not tested)  LOWER EXTREMITY ROM:    ROM Right 9/16 Left 9/16  Knee flexion 134 135  Knee extension 3 0  (Blank rows = not tested)  POSTURE: rounded shoulders, forward head, increased thoracic kyphosis, weight shift left, and L shoulder lower than R   PALPATION:  Tender along lumbar paraspinals bilaterally  BED MOBILITY:  Findings: Tends to go long sit<>supine with pain upon supine; instructed in log roll technique:  supine>L side>sit with min assist and cues  TRANSFERS: Sit to  stand: Modified independence  Assistive device utilized: BUE support     Stand to sit: Modified independence  Assistive device utilized: BUE support      GAIT: Findings: Gait Characteristics: per MD note-minimal RLE weightbearing, step to pattern, step through pattern, and decreased stance time- Right, Distance walked: 20 ft, Assistive device utilized:Walker - 2 wheeled, Level of assistance: SBA, and Comments: antalgic pattern  FUNCTIONAL TESTS:  5 times sit to stand: 37.25 sec with BUE support  PATIENT SURVEYS:  Modified Oswestry:  MODIFIED OSWESTRY DISABILITY SCALE  Date: 09/10/2023 Score  Pain intensity 4 =  Pain medication provides me with little relief from pain.  2. Personal care (washing, dressing, etc.) 2 =  It is painful to take care of myself, and I am  slow and careful.  3. Lifting 5 =  I cannot lift or carry anything at all.  4. Walking 4 = I can only walk with crutches or a cane.  5. Sitting 3 =  Pain prevents me from sitting more than  hour.  6. Standing 3 =  Pain prevents me from standing more than 1/2 hour.  7. Sleeping 1 = I can sleep well only by using pain medication.  8. Social Life 5 =  I have hardly any social life because of my pain.  9. Traveling 2 =  My pain restricts my travel over 2 hours.  10. Employment/ Homemaking 3 = Pain prevents me from doing anything but light duties.  Total 32/50= 64%   Interpretation of scores: Score Category Description  0-20% Minimal Disability The patient can cope with most living activities. Usually no treatment is indicated apart from advice on lifting, sitting and exercise  21-40% Moderate Disability The patient experiences more pain and difficulty with sitting, lifting and standing. Travel and social life are more difficult and they may be disabled from work. Personal care, sexual activity and sleeping are not grossly affected, and the patient can usually be managed by conservative means  41-60% Severe Disability Pain remains the main problem in this group, but activities of daily living are affected. These patients require a detailed investigation  61-80% Crippled Back pain impinges on all aspects of the patient's life. Positive intervention is required  81-100% Bed-bound  These patients are either bed-bound or exaggerating their symptoms  Bluford FORBES Zoe DELENA Karon DELENA, et al. Surgery versus conservative management of stable thoracolumbar fracture: the PRESTO feasibility RCT. [place] ([place]): [employer]; 2021 Nov. [facility] Technology Assessment, No. 25.62.) Appendix 3, Oswestry Disability Index category descriptors. Available from: Findjewelers.cz  Minimally Clinically Important Difference (MCID) = 12.8%       GOALS: Goals reviewed with patient?  Yes  SHORT TERM GOALS: Target date: 09/25/2023  Pt will be independent with HEP for improved strength, pain, transfers. Baseline: none; 10/01/23: compliant Goal status: MET  2.  Pt will improve 5x sit<>stand to less than or equal to 30 sec to demonstrate improved functional strength and transfer efficiency. Baseline: 37.25 sec; 24.95 sec with B UE support 10/06/23 Goal status: MET 10/06/23  LONG TERM GOALS: Target date: 10/23/2023>UPDATED TARGET 12/04/2023  Pt will be independent with progression of HEP for improved pain, strength, transfers. Baseline: updated 10/7 Goal status: IN PROGRESS  2.  Pt will improve Modified Oswestry Score to less than or equal to 50%, to demo less pain interference in daily activities. Baseline: 64% Goal status: IN PROGRESS  3.  Pt will improve 5x sit<>stand to less than or equal to 20 sec  to demonstrate improved functional strength and transfer efficiency. Baseline: 26 sec 10/22/2023 (improved from 37 sec) Goal status: IN PROGRESS, 10/22/2023  4.  Pt will rate pain in low back, decreased by at least 50%, for improved ADLs and gait activities. Baseline: much less pain, 10/22/2023 Goal status: MET, 10/22/2023  5. Patient to report tolerance for 30 minutes of standing/walking without device to return to PLOF.  Baseline: reports tolerating 30 min with RW; increased forward lean without device or with cane  Goal status: IN PROGRESS 10/22/2023  6.. Patient to demonstrate gait speed of at least 2.3 ft/sec in order to improve access to community.  Baseline: 1.9 ft/sec with RW; 2.09 ft/sec with RW 10/22/2023 Goal status: INITIAL    ASSESSMENT:  CLINICAL IMPRESSION: Pt presents today with her walker and bringing in her quad tip base cane.  Her cane is too tall for her, and it tends to tip her upper body back posteriorly.  Worked with our clinic cane and no device today, with pt needing supervision for gait activities.  Able to progress to standing BLE  strengthening with 2# ankle weight, and she does much better with her posture with ankle weight resistance than resisted bands trialed in last session.  Upgraded HEP for her to try 2# ankle weights at gym at [place] and would like pt to ambulate into session with cane next visit.  She is progressing nicely, and will continue to progress well with gait as she improves BLE strength.   OBJECTIVE IMPAIRMENTS: Abnormal gait, decreased balance, decreased mobility, difficulty walking, decreased strength, impaired flexibility, postural dysfunction, and pain.   ACTIVITY LIMITATIONS: sitting, standing, sleeping, transfers, bed mobility, and locomotion level  PARTICIPATION LIMITATIONS: community activity  PERSONAL FACTORS: 3+ comorbidities: See PMH above are also affecting patient's functional outcome.   REHAB POTENTIAL: Good  CLINICAL DECISION MAKING: Evolving/moderate complexity  EVALUATION COMPLEXITY: Moderate  PLAN:  PT FREQUENCY: 1-2x/week  PT DURATION: 6 weeks  PLANNED INTERVENTIONS: 97164- PT Re-evaluation, 97750- Physical Performance Testing, 97110-Therapeutic exercises, 97530- Therapeutic activity, [id number]- Neuromuscular re-education, 97535- Self Care, [id number]- Manual therapy, [id number]- Gait training, [phone]- Canalith repositioning, [id number]- Aquatic Therapy, [phone]- Electrical stimulation (manual), [phone] (1-2 muscles), 20561 (3+ muscles)- Dry Needling, Patient/Family education, Balance training, Stair training, Taping, Joint mobilization, Spinal mobilization, Vestibular training, DME instructions, Cryotherapy, and Moist heat  PLAN FOR NEXT SESSION:  Gait outdoors with cane.  Continue to try interval training of activities- Progress postural strengthening; progress leg strength and balance.  Progress for abdominal and lumbar strengthening. (Have pt complete Oswestry*-did not do today due to answering pt/husband's questions) Review updates to and progress HEP    Greig Anon, PT 11/17/23 6:00  PM Phone: [phone] Fax: [phone]  [facility] at [facility] Neuro [address] [place], [place] [address] Phone # [phone] Fax # [phone]

## 2023-11-18 NOTE — Telephone Encounter (Signed)
 Spoke with pt about a SIBO test kit and trying the FODMAP diet. Information sent via my chart. Pt verbalized understanding.

## 2023-11-18 NOTE — Telephone Encounter (Signed)
 Returned call to pt to get more information about what she has been taking. She reports that she has been taking the IBgard BID. She takes it in the middle of the day and at dinner time. She denies any symptom improvement with this medication.

## 2023-11-18 NOTE — Telephone Encounter (Signed)
 Called the pt regarding her my chart message. She reports that she spoke with Harlene on 10/24 regarding her bloating symptoms. And she was told that Harlene would call the patient back last Thursday 10/30. There is no documentation that pt spoke with Jess, however, it is documented that the pt spoke with [facility] on this day regarding her bloating. Discussed this with pt. Pt wishes to report her current symptoms. She has started back taking the Benefiber 1 tsp BID with meals. She reports that she is having better bowel movements, however, this has not helped her bloating at all. She denies any relief and states her stomach still feels large. She is looking for recommendations on how to decrease the bloating sensation.

## 2023-11-19 DIAGNOSIS — H401133 Primary open-angle glaucoma, bilateral, severe stage: Secondary | ICD-10-CM | POA: Diagnosis not present

## 2023-11-19 DIAGNOSIS — Z961 Presence of intraocular lens: Secondary | ICD-10-CM | POA: Diagnosis not present

## 2023-11-19 DIAGNOSIS — Z79899 Other long term (current) drug therapy: Secondary | ICD-10-CM | POA: Diagnosis not present

## 2023-11-19 DIAGNOSIS — H33021 Retinal detachment with multiple breaks, right eye: Secondary | ICD-10-CM | POA: Diagnosis not present

## 2023-11-19 DIAGNOSIS — H33312 Horseshoe tear of retina without detachment, left eye: Secondary | ICD-10-CM | POA: Diagnosis not present

## 2023-11-19 DIAGNOSIS — H35371 Puckering of macula, right eye: Secondary | ICD-10-CM | POA: Diagnosis not present

## 2023-11-20 ENCOUNTER — Telehealth: Payer: Self-pay | Admitting: Gastroenterology

## 2023-11-20 ENCOUNTER — Encounter: Payer: Self-pay | Admitting: Physical Therapy

## 2023-11-20 ENCOUNTER — Ambulatory Visit: Admitting: Physical Therapy

## 2023-11-20 DIAGNOSIS — R2681 Unsteadiness on feet: Secondary | ICD-10-CM

## 2023-11-20 DIAGNOSIS — R293 Abnormal posture: Secondary | ICD-10-CM | POA: Diagnosis not present

## 2023-11-20 DIAGNOSIS — M6281 Muscle weakness (generalized): Secondary | ICD-10-CM | POA: Diagnosis not present

## 2023-11-20 NOTE — Telephone Encounter (Signed)
 Inbound call from patient husband stating that he was needing the diagnosis code for the SIBO test his wife needs to pick up. Patient husband is requesting a call back to him at [phone]. Please advise.

## 2023-11-20 NOTE — Telephone Encounter (Signed)
 Attempted to reach spouse to discuss diagnosis code. No answer, left vm for him to return my call.   If he calls back the diagnosis code for the SIBO test is R14.0 (abdominal distension.)

## 2023-11-20 NOTE — Therapy (Signed)
 OUTPATIENT PHYSICAL THERAPY NEURO TREATMENT NOTE   Patient Name: Wendy Jackson MRN: [id number] DOB:01-16-1945, 78 y.o., female Today's Date: 11/20/2023   PCP: Catherine Charlies LABOR, DO REFERRING PROVIDER: Persons, Ronal Dragon, [place]  (Additional referral from Jerri Kay HERO, MD for [id number] (ICD-10-CM) - Acute pain of right knee M54.50 (ICD-10-CM) - Acute bilateral low back pain without sciatica)       END OF SESSION:  PT End of Session - 11/20/23 1108     Visit Number 13    Number of Visits 21    Date for Recertification  12/04/23    Authorization Type [employer]BS medicare    Progress Note Due on Visit 19    PT Start Time 1104    PT Stop Time 1146    PT Time Calculation (min) 42 min    Equipment Utilized During Treatment Gait belt    Activity Tolerance Patient tolerated treatment well    Behavior During Therapy WFL for tasks assessed/performed                  Past Medical History:  Diagnosis Date   Cataract 03/2019   Glaucoma Approximately 2005   [place] retinal tear of left eye 04/02/2021   Hyperlipidemia LDL goal <70 01/11/2021   Hypertension 2022   Long-term use of Plaquenil  04/02/2021   Lupus    new rheum questioning original dx. continuing Plaquenil  at lower dose for now.   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 bladder, acquired    Pseudophakia of both eyes 04/02/2021   Retinal detachment with multiple breaks, right eye 04/02/2021   Stroke (HCC) 04/2020   Thyroid  disease    Tibial plateau fracture, right 2025   Past Surgical History:  Procedure Laterality Date   ENDARTERECTOMY Right 05/02/2020   Procedure: RIGHT CAROTID ENDARTERECTOMY;  Surgeon: Oris Krystal FALCON, MD;  Location: [facility] OR;  Service: Vascular;  Laterality: Right;   EYE SURGERY  01/2020   LEFT HEART CATH AND CORONARY ANGIOGRAPHY N/A 02/03/2023   Procedure: LEFT HEART CATH AND CORONARY ANGIOGRAPHY;  Surgeon: Verlin Lonni BIRCH, MD;  Location: [facility] INVASIVE [facility]  LAB;  Service: Cardiovascular;  Laterality: N/A;   SCLERAL BUCKLE Right 2004   TUBAL LIGATION  Feb 1982   Patient Active Problem List   Diagnosis Date Noted   Incontinence of feces with fecal urgency 05/05/2023   Gastroesophageal reflux disease with esophagitis without hemorrhage 05/05/2023   Bloating 05/05/2023   Other specified abnormal immunological findings in serum 04/03/2023   Elevated erythrocyte sedimentation rate 04/03/2023   High coronary artery calcium  score: 1525; 96th percentile 01/29/2023   SVT (supraventricular tachycardia) 12/31/2022   Decreased exercise tolerance 12/31/2022   Polymyalgia rheumatica 11/25/2022   Other secondary scoliosis, lumbar region 11/17/2022   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   Other female genital prolapse 10/29/2021   Long-term use of Plaquenil  04/02/2021   Primary open angle glaucoma (POAG) of both eyes, severe stage 04/02/2021   Primary hypertension 01/11/2021   Hyperlipidemia LDL goal <70 01/11/2021   Acquired thrombophilia 12/11/2020   BMI less than 19,adult 12/11/2020   Hypothyroidism 12/11/2020   History of CVA (cerebrovascular accident) 05/02/2020   Allergic rhinitis    CAD (coronary artery disease) 05/20/2018    ONSET DATE: 09/04/2023 (MD referral)  REFERRING DIAG: M54.50 (ICD-10-CM) - Acute bilateral low back pain without sciatica  New referral in patient's chart from Dr. Jerri stating Right tibial plateau fracture. WBAT. Knee rehab. Strengthening. Gait training.  THERAPY DIAG:  Abnormal posture  Muscle weakness (generalized)  Unsteadiness on feet  Rationale for Evaluation and Treatment: Rehabilitation  SUBJECTIVE:                                                                                                                                                                                             SUBJECTIVE STATEMENT: Still haven't gotten a new cane.  Did get some 2.5# weights yesterday. Pt accompanied by: self and  significant other  PERTINENT HISTORY: followed by Dr. Jerri for a lateral nondisplaced tibial plateau fracture.  She has been having to use a walker and bear minimal weight.  She does have a history of significant degenerative scoliosis and degenerative changes for which she had seen Dr. Barbarann in the past. Also hx of IBS, fecal leakage, has had pelvic floor therapy; polymalgia rheumatica   PAIN:  Are you having pain? No  PRECAUTIONS: Fall and Other: 10/01/23 pt reports she is now full weight bearing on R  RED FLAGS: None   WEIGHT BEARING RESTRICTIONS: None  FALLS: Has patient fallen in last 6 months? Yes. Number of falls 1; 3 falls in past 13 months (all have been tripping)  LIVING ENVIRONMENT: Lives with: lives with their spouse Lives in: House/apartment [place] Stairs: No Has following equipment at home: Single point cane, Environmental Consultant - 2 wheeled, and single leg scooter (unable to use)  PLOF: Independent  PATIENT GOALS: To get back less tight and painful  OBJECTIVE:      TODAY'S TREATMENT: 11/20/2023 Activity Comments     Lower extremity strengthening: Standing 3- way hip kicks -abduction 2 x 10 -extension 2 x 10 -marching 2 x 10 2# BLE,  cues for abdominal activation and upright posture Consecutive side, then alternating sides     Gait training with cane, and quad tip base, 50 ft x 6, then no device 50 ft x 6, head turns/head nods Several times of slowed pace and veering with head motions, PT provides supervision  Outdoor gait with cane/quad tip base, 500 ft on sidewalk surfaces Supervision, occasional veering, able to self-correct  Curb negotiation in clinic, 8 step, 8 reps  Supervision and cues  Answered pt/husband's questions about cane use, potential return to classes at [facility] Discussed that she would be okay to try seated classes initially   Access Code: [email or web address] URL: [email or web address] Date: 11/11/2023 Prepared by: [facility] - Outpatient  Rehab -  [facility]  Exercises - Supine Pelvic Tilt  - 1-2 x daily - 7 x weekly - 1-2 sets - 5-10 reps - Lower Trunk Rotations  - 1 x daily - 7  x weekly - 2 sets - 10 reps - Supine Scapular Retraction  - 1 x daily - 7 x weekly - 2 sets - 10 reps - Seated Hamstring Stretch  - 1 x daily - 7 x weekly - 2 sets - 30 sec hold - Standing Scapular Retraction  - 1 x daily - 7 x weekly - 3 sets - 10 reps - Seated Long Arc Quad  - 1 x daily - 7 x weekly - 2 sets - 10 reps - Backward Walking with Counter Support  - 1 x daily - 7 x weekly - 1 sets - 3-5 reps - Forward Step Up  - 1 x daily - 7 x weekly - 3 sets - 5 reps - Dead Bug  - 1 x daily - 7 x weekly - 1 sets - 10 reps - Supine Bridge  - 1 x daily - 7 x weekly - 2-3 sets - 10 reps - Side Stepping with Resistance at Ankles and Counter Support  - 1 x daily - 7 x weekly - 1 sets - 3 reps - Standing 3-Way Leg Reach with Resistance (2#) at Ankles and Counter Support  - 1 x daily - 7 x weekly - 3 sets - 10 reps   PATIENT EDUCATION: Education details: Answered questions about cane, about returning to classes (maybe try part of seated ex class); try to walk into PT clinic next visit with cane Person educated: Patient and Spouse Education method: Explanation, Demonstration, Tactile cues, Verbal cues, and Handouts Education comprehension: verbalized understanding and returned demonstration     Note: Objective measures were completed at Evaluation unless otherwise noted.  DIAGNOSTIC FINDINGS: No acute findings per pt/husband report-curvature and severe arthritis  COGNITION: Overall cognitive status: Within functional limits for tasks assessed   SENSATION: Not tested  MUSCLE LENGTH: Hamstrings: Right NT deg; Left -30 deg   LOWER EXTREMITY MMT:     Active (in sitting) Right 9/16 Left 9/16  Hip flexion 4+ 4  Hip extension    Hip abduction 4 4  Hip adduction 4+ 4+  Hip internal rotation    Hip external rotation    Knee flexion 4+ 4+   Knee extension 4+ 5  Ankle dorsiflexion 4 4  Ankle plantarflexion 4+ 4+  Ankle inversion    Ankle eversion     (Blank rows = not tested)  LOWER EXTREMITY ROM:    ROM Right 9/16 Left 9/16  Knee flexion 134 135  Knee extension 3 0  (Blank rows = not tested)  POSTURE: rounded shoulders, forward head, increased thoracic kyphosis, weight shift left, and L shoulder lower than R   PALPATION:  Tender along lumbar paraspinals bilaterally  BED MOBILITY:  Findings: Tends to go long sit<>supine with pain upon supine; instructed in log roll technique:  supine>L side>sit with min assist and cues  TRANSFERS: Sit to stand: Modified independence  Assistive device utilized: BUE support     Stand to sit: Modified independence  Assistive device utilized: BUE support      GAIT: Findings: Gait Characteristics: per MD note-minimal RLE weightbearing, step to pattern, step through pattern, and decreased stance time- Right, Distance walked: 20 ft, Assistive device utilized:Walker - 2 wheeled, Level of assistance: SBA, and Comments: antalgic pattern  FUNCTIONAL TESTS:  5 times sit to stand: 37.25 sec with BUE support  PATIENT SURVEYS:  Modified Oswestry:  MODIFIED OSWESTRY DISABILITY SCALE  Date: 09/10/2023 Score  Pain intensity 4 =  Pain medication provides me with little  relief from pain.  2. Personal care (washing, dressing, etc.) 2 =  It is painful to take care of myself, and I am slow and careful.  3. Lifting 5 =  I cannot lift or carry anything at all.  4. Walking 4 = I can only walk with crutches or a cane.  5. Sitting 3 =  Pain prevents me from sitting more than  hour.  6. Standing 3 =  Pain prevents me from standing more than 1/2 hour.  7. Sleeping 1 = I can sleep well only by using pain medication.  8. Social Life 5 =  I have hardly any social life because of my pain.  9. Traveling 2 =  My pain restricts my travel over 2 hours.  10. Employment/ Homemaking 3 = Pain prevents me from  doing anything but light duties.  Total 32/50= 64%   Interpretation of scores: Score Category Description  0-20% Minimal Disability The patient can cope with most living activities. Usually no treatment is indicated apart from advice on lifting, sitting and exercise  21-40% Moderate Disability The patient experiences more pain and difficulty with sitting, lifting and standing. Travel and social life are more difficult and they may be disabled from work. Personal care, sexual activity and sleeping are not grossly affected, and the patient can usually be managed by conservative means  41-60% Severe Disability Pain remains the main problem in this group, but activities of daily living are affected. These patients require a detailed investigation  61-80% Crippled Back pain impinges on all aspects of the patient's life. Positive intervention is required  81-100% Bed-bound  These patients are either bed-bound or exaggerating their symptoms  Bluford FORBES Zoe DELENA Karon DELENA, et al. Surgery versus conservative management of stable thoracolumbar fracture: the PRESTO feasibility RCT. [place] ([place]): [employer]; 2021 Nov. [facility] Technology Assessment, No. 25.62.) Appendix 3, Oswestry Disability Index category descriptors. Available from: Findjewelers.cz  Minimally Clinically Important Difference (MCID) = 12.8%       GOALS: Goals reviewed with patient? Yes  SHORT TERM GOALS: Target date: 09/25/2023  Pt will be independent with HEP for improved strength, pain, transfers. Baseline: none; 10/01/23: compliant Goal status: MET  2.  Pt will improve 5x sit<>stand to less than or equal to 30 sec to demonstrate improved functional strength and transfer efficiency. Baseline: 37.25 sec; 24.95 sec with B UE support 10/06/23 Goal status: MET 10/06/23  LONG TERM GOALS: Target date: 10/23/2023>UPDATED TARGET 12/04/2023  Pt will be independent with progression of HEP for  improved pain, strength, transfers. Baseline: updated 10/7 Goal status: IN PROGRESS  2.  Pt will improve Modified Oswestry Score to less than or equal to 50%, to demo less pain interference in daily activities. Baseline: 64% Goal status: IN PROGRESS  3.  Pt will improve 5x sit<>stand to less than or equal to 20 sec to demonstrate improved functional strength and transfer efficiency. Baseline: 26 sec 10/22/2023 (improved from 37 sec) Goal status: IN PROGRESS, 10/22/2023  4.  Pt will rate pain in low back, decreased by at least 50%, for improved ADLs and gait activities. Baseline: much less pain, 10/22/2023 Goal status: MET, 10/22/2023  5. Patient to report tolerance for 30 minutes of standing/walking without device to return to PLOF.  Baseline: reports tolerating 30 min with RW; increased forward lean without device or with cane  Goal status: IN PROGRESS 10/22/2023  6.. Patient to demonstrate gait speed of at least 2.3 ft/sec in order to improve access to  community.  Baseline: 1.9 ft/sec with RW; 2.09 ft/sec with RW 10/22/2023 Goal status: INITIAL    ASSESSMENT:  CLINICAL IMPRESSION: Pt presents today with her RW; she and husband are actively looking for appropriate cane.  Skilled PT session focused on gait training without and without device, indoors and outdoors.  She is able to ambulate in clinic without device with supervision; on outdoor surfaces, cane is optimal for stability.  She has several occasions of veering/sway with gait, but she is able to right herself with PT supervision.  She is progressing well with gait and she is demonstrating improved stability/posture with standing exercises.  She does note that ongoing GI issues tend to lead to forward posture and increased fatigue by end of the day; encouraged pt/husband to ambulate more with cane earlier in the day when she has more energy and stamina.  Pt will continue to benefit from skilled PT towards goals for improved functional  mobility and decreased fall risk.  OBJECTIVE IMPAIRMENTS: Abnormal gait, decreased balance, decreased mobility, difficulty walking, decreased strength, impaired flexibility, postural dysfunction, and pain.   ACTIVITY LIMITATIONS: sitting, standing, sleeping, transfers, bed mobility, and locomotion level  PARTICIPATION LIMITATIONS: community activity  PERSONAL FACTORS: 3+ comorbidities: See PMH above are also affecting patient's functional outcome.   REHAB POTENTIAL: Good  CLINICAL DECISION MAKING: Evolving/moderate complexity  EVALUATION COMPLEXITY: Moderate  PLAN:  PT FREQUENCY: 1-2x/week  PT DURATION: 6 weeks  PLANNED INTERVENTIONS: 97164- PT Re-evaluation, 97750- Physical Performance Testing, 97110-Therapeutic exercises, 97530- Therapeutic activity, [id number]- Neuromuscular re-education, 97535- Self Care, [id number]- Manual therapy, [id number]- Gait training, [phone]- Canalith repositioning, [id number]- Aquatic Therapy, [phone]- Electrical stimulation (manual), [phone] (1-2 muscles), 20561 (3+ muscles)- Dry Needling, Patient/Family education, Balance training, Stair training, Taping, Joint mobilization, Spinal mobilization, Vestibular training, DME instructions, Cryotherapy, and Moist heat  PLAN FOR NEXT SESSION: Continue to try interval training of activities- Progress postural strengthening; progress leg strength and balance.  Progress for abdominal and lumbar strengthening. (Have pt complete Oswestry*-did not do today due to answering pt/husband's questions) Gait and balance activities without cane; ?POC and scheduling    Greig Anon, PT 11/20/23 11:51 AM Phone: [phone] Fax: [phone]  [facility] at [facility] [address] [place], [address] [place], [place] [address] Phone # [phone] Fax # [phone]

## 2023-11-20 NOTE — Telephone Encounter (Signed)
 Pt checks her my chart messages. Sent a message regarding the diagnosis code for the SIBO test kit.

## 2023-11-24 ENCOUNTER — Encounter: Payer: Self-pay | Admitting: Physical Therapy

## 2023-11-24 ENCOUNTER — Ambulatory Visit: Admitting: Physical Therapy

## 2023-11-24 DIAGNOSIS — R293 Abnormal posture: Secondary | ICD-10-CM

## 2023-11-24 DIAGNOSIS — M6281 Muscle weakness (generalized): Secondary | ICD-10-CM | POA: Diagnosis not present

## 2023-11-24 DIAGNOSIS — R2681 Unsteadiness on feet: Secondary | ICD-10-CM

## 2023-11-24 NOTE — Therapy (Signed)
 OUTPATIENT PHYSICAL THERAPY NEURO TREATMENT NOTE   Patient Name: Wendy Jackson MRN: [id number] DOB:1945-05-05, 78 y.o., female Today's Date: 11/24/2023   PCP: Catherine Charlies LABOR, DO REFERRING PROVIDER: Persons, Ronal Dragon, [place]  (Additional referral from Jerri Kay HERO, MD for [phone] (ICD-10-CM) - Acute pain of right knee M54.50 (ICD-10-CM) - Acute bilateral low back pain without sciatica)       END OF SESSION:  PT End of Session - 11/24/23 1232     Visit Number 14    Number of Visits 21    Date for Recertification  12/04/23    Authorization Type [employer] medicare    Progress Note Due on Visit 19    PT Start Time 1234    PT Stop Time 1316    PT Time Calculation (min) 42 min    Equipment Utilized During Treatment Gait belt    Activity Tolerance Patient tolerated treatment well    Behavior During Therapy WFL for tasks assessed/performed                   Past Medical History:  Diagnosis Date   Cataract 03/2019   Glaucoma Approximately 2005   [place] retinal tear of left eye 04/02/2021   Hyperlipidemia LDL goal <70 01/11/2021   Hypertension 2022   Long-term use of Plaquenil  04/02/2021   Lupus    new rheum questioning original dx. continuing Plaquenil  at lower dose for now.   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 bladder, acquired    Pseudophakia of both eyes 04/02/2021   Retinal detachment with multiple breaks, right eye 04/02/2021   Stroke (HCC) 04/2020   Thyroid  disease    Tibial plateau fracture, right 2025   Past Surgical History:  Procedure Laterality Date   ENDARTERECTOMY Right 05/02/2020   Procedure: RIGHT CAROTID ENDARTERECTOMY;  Surgeon: Oris Krystal FALCON, MD;  Location: [facility];  Service: Vascular;  Laterality: Right;   EYE SURGERY  01/2020   LEFT HEART CATH AND CORONARY ANGIOGRAPHY N/A 02/03/2023   Procedure: LEFT HEART CATH AND CORONARY ANGIOGRAPHY;  Surgeon: Verlin Lonni BIRCH, MD;  Location: [facility] INVASIVE CV  LAB;  Service: Cardiovascular;  Laterality: N/A;   SCLERAL BUCKLE Right 2004   TUBAL LIGATION  Feb 1982   Patient Active Problem List   Diagnosis Date Noted   Incontinence of feces with fecal urgency 05/05/2023   Gastroesophageal reflux disease with esophagitis without hemorrhage 05/05/2023   Bloating 05/05/2023   Other specified abnormal immunological findings in serum 04/03/2023   Elevated erythrocyte sedimentation rate 04/03/2023   High coronary artery calcium  score: 1525; 96th percentile 01/29/2023   SVT (supraventricular tachycardia) 12/31/2022   Decreased exercise tolerance 12/31/2022   Polymyalgia rheumatica 11/25/2022   Other secondary scoliosis, lumbar region 11/17/2022   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   Other female genital prolapse 10/29/2021   Long-term use of Plaquenil  04/02/2021   Primary open angle glaucoma (POAG) of both eyes, severe stage 04/02/2021   Primary hypertension 01/11/2021   Hyperlipidemia LDL goal <70 01/11/2021   Acquired thrombophilia 12/11/2020   BMI less than 19,adult 12/11/2020   Hypothyroidism 12/11/2020   History of CVA (cerebrovascular accident) 05/02/2020   Allergic rhinitis    CAD (coronary artery disease) 05/20/2018    ONSET DATE: 09/04/2023 (MD referral)  REFERRING DIAG: M54.50 (ICD-10-CM) - Acute bilateral low back pain without sciatica  New referral in patient's chart from Dr. Jerri stating Right tibial plateau fracture. WBAT. Knee rehab. Strengthening. Gait  training.   THERAPY DIAG:  Abnormal posture  Muscle weakness (generalized)  Unsteadiness on feet  Rationale for Evaluation and Treatment: Rehabilitation  SUBJECTIVE:                                                                                                                                                                                             SUBJECTIVE STATEMENT: Been riding the bike 17-18 minutes for a few times.  Walked in with RW today, because I haven't gotten my  new cane yet.  (It has been ordered).  Still by the end of the day, my posture changes due to the bloating in abdomen (still waiting to follow up on this medically). Pt accompanied by: self and significant other  PERTINENT HISTORY: followed by Dr. Jerri for a lateral nondisplaced tibial plateau fracture.  She has been having to use a walker and bear minimal weight.  She does have a history of significant degenerative scoliosis and degenerative changes for which she had seen Dr. Barbarann in the past. Also hx of IBS, fecal leakage, has had pelvic floor therapy; polymalgia rheumatica   PAIN:  Are you having pain? No  PRECAUTIONS: Fall and Other: 10/01/23 pt reports she is now full weight bearing on R  RED FLAGS: None   WEIGHT BEARING RESTRICTIONS: None  FALLS: Has patient fallen in last 6 months? Yes. Number of falls 1; 3 falls in past 13 months (all have been tripping)  LIVING ENVIRONMENT: Lives with: lives with their spouse Lives in: House/apartment [place] Stairs: No Has following equipment at home: Single point cane, Environmental Consultant - 2 wheeled, and single leg scooter (unable to use)  PLOF: Independent  PATIENT GOALS: To get back less tight and painful  OBJECTIVE:    Activities performed today with no device and min guard/supervision  TODAY'S TREATMENT: 11/24/2023 Activity Comments  Gait no device, 255 ft with supervision Some mild sway and mild veering to L, pt able to self correct  Forward/back walking 25 ft x 3 reps   Gait with head turns Gait with head nods Slowed pace  Gait negotiating around and over obstacles-with added cognitive task Cues for arm swing (versus holding arms behind her back)-better balance with arm swing  Four square step activity Mirror for visual cues, min guard assist and cues for larger step length initially  Heel walking Toe walking  Heel to toe walking Along counter           Access Code: [email or web address] URL: [email or web address] Date:  11/11/2023 Prepared by: [place] - Outpatient  Rehab - [facility]  Exercises - Supine Pelvic Tilt  - 1-2  x daily - 7 x weekly - 1-2 sets - 5-10 reps - Lower Trunk Rotations  - 1 x daily - 7 x weekly - 2 sets - 10 reps - Supine Scapular Retraction  - 1 x daily - 7 x weekly - 2 sets - 10 reps - Seated Hamstring Stretch  - 1 x daily - 7 x weekly - 2 sets - 30 sec hold - Standing Scapular Retraction  - 1 x daily - 7 x weekly - 3 sets - 10 reps - Seated Long Arc Quad  - 1 x daily - 7 x weekly - 2 sets - 10 reps - Backward Walking with Counter Support  - 1 x daily - 7 x weekly - 1 sets - 3-5 reps - Forward Step Up  - 1 x daily - 7 x weekly - 3 sets - 5 reps - Dead Bug  - 1 x daily - 7 x weekly - 1 sets - 10 reps - Supine Bridge  - 1 x daily - 7 x weekly - 2-3 sets - 10 reps - Side Stepping with Resistance at Ankles and Counter Support  - 1 x daily - 7 x weekly - 1 sets - 3 reps - Standing 3-Way Leg Reach with Resistance (2#) at Ankles and Counter Support  - 1 x daily - 7 x weekly - 3 sets - 10 reps   PATIENT EDUCATION: Education details: Discussed POC and discussed options for additional scheduling to go down to 1x/wk to allow for pt to try to return to classes.  Rationale for practice with gait activities and no device today Person educated: Patient and Spouse Education method: Explanation, Demonstration, Tactile cues, Verbal cues, and Handouts Education comprehension: verbalized understanding and returned demonstration     Note: Objective measures were completed at Evaluation unless otherwise noted.  DIAGNOSTIC FINDINGS: No acute findings per pt/husband report-curvature and severe arthritis  COGNITION: Overall cognitive status: Within functional limits for tasks assessed   SENSATION: Not tested  MUSCLE LENGTH: Hamstrings: Right NT deg; Left -30 deg   LOWER EXTREMITY MMT:     Active (in sitting) Right 9/16 Left 9/16  Hip flexion 4+ 4  Hip extension    Hip abduction 4  4  Hip adduction 4+ 4+  Hip internal rotation    Hip external rotation    Knee flexion 4+ 4+  Knee extension 4+ 5  Ankle dorsiflexion 4 4  Ankle plantarflexion 4+ 4+  Ankle inversion    Ankle eversion     (Blank rows = not tested)  LOWER EXTREMITY ROM:    ROM Right 9/16 Left 9/16  Knee flexion 134 135  Knee extension 3 0  (Blank rows = not tested)  POSTURE: rounded shoulders, forward head, increased thoracic kyphosis, weight shift left, and L shoulder lower than R   PALPATION:  Tender along lumbar paraspinals bilaterally  BED MOBILITY:  Findings: Tends to go long sit<>supine with pain upon supine; instructed in log roll technique:  supine>L side>sit with min assist and cues  TRANSFERS: Sit to stand: Modified independence  Assistive device utilized: BUE support     Stand to sit: Modified independence  Assistive device utilized: BUE support      GAIT: Findings: Gait Characteristics: per MD note-minimal RLE weightbearing, step to pattern, step through pattern, and decreased stance time- Right, Distance walked: 20 ft, Assistive device utilized:Walker - 2 wheeled, Level of assistance: SBA, and Comments: antalgic pattern  FUNCTIONAL TESTS:  5 times sit to stand:  37.25 sec with BUE support  PATIENT SURVEYS:  Modified Oswestry:  MODIFIED OSWESTRY DISABILITY SCALE  Date: 09/10/2023 Score  Pain intensity 4 =  Pain medication provides me with little relief from pain.  2. Personal care (washing, dressing, etc.) 2 =  It is painful to take care of myself, and I am slow and careful.  3. Lifting 5 =  I cannot lift or carry anything at all.  4. Walking 4 = I can only walk with crutches or a cane.  5. Sitting 3 =  Pain prevents me from sitting more than  hour.  6. Standing 3 =  Pain prevents me from standing more than 1/2 hour.  7. Sleeping 1 = I can sleep well only by using pain medication.  8. Social Life 5 =  I have hardly any social life because of my pain.  9. Traveling 2 =  My  pain restricts my travel over 2 hours.  10. Employment/ Homemaking 3 = Pain prevents me from doing anything but light duties.  Total 32/50= 64%   Interpretation of scores: Score Category Description  0-20% Minimal Disability The patient can cope with most living activities. Usually no treatment is indicated apart from advice on lifting, sitting and exercise  21-40% Moderate Disability The patient experiences more pain and difficulty with sitting, lifting and standing. Travel and social life are more difficult and they may be disabled from work. Personal care, sexual activity and sleeping are not grossly affected, and the patient can usually be managed by conservative means  41-60% Severe Disability Pain remains the main problem in this group, but activities of daily living are affected. These patients require a detailed investigation  61-80% Crippled Back pain impinges on all aspects of the patient's life. Positive intervention is required  81-100% Bed-bound  These patients are either bed-bound or exaggerating their symptoms  Bluford FORBES Zoe DELENA Karon DELENA, et al. Surgery versus conservative management of stable thoracolumbar fracture: the PRESTO feasibility RCT. [place] ([place]): [employer]; 2021 Nov. [facility] Technology Assessment, No. 25.62.) Appendix 3, Oswestry Disability Index category descriptors. Available from: Findjewelers.cz  Minimally Clinically Important Difference (MCID) = 12.8%       GOALS: Goals reviewed with patient? Yes  SHORT TERM GOALS: Target date: 09/25/2023  Pt will be independent with HEP for improved strength, pain, transfers. Baseline: none; 10/01/23: compliant Goal status: MET  2.  Pt will improve 5x sit<>stand to less than or equal to 30 sec to demonstrate improved functional strength and transfer efficiency. Baseline: 37.25 sec; 24.95 sec with B UE support 10/06/23 Goal status: MET 10/06/23  LONG TERM GOALS: Target date:  10/23/2023>UPDATED TARGET 12/04/2023  Pt will be independent with progression of HEP for improved pain, strength, transfers. Baseline: updated 10/7 Goal status: IN PROGRESS  2.  Pt will improve Modified Oswestry Score to less than or equal to 50%, to demo less pain interference in daily activities. Baseline: 64% Goal status: IN PROGRESS  3.  Pt will improve 5x sit<>stand to less than or equal to 20 sec to demonstrate improved functional strength and transfer efficiency. Baseline: 26 sec 10/22/2023 (improved from 37 sec) Goal status: IN PROGRESS, 10/22/2023  4.  Pt will rate pain in low back, decreased by at least 50%, for improved ADLs and gait activities. Baseline: much less pain, 10/22/2023 Goal status: MET, 10/22/2023  5. Patient to report tolerance for 30 minutes of standing/walking without device to return to PLOF.  Baseline: reports tolerating 30 min with RW;  increased forward lean without device or with cane  Goal status: IN PROGRESS 10/22/2023  6.. Patient to demonstrate gait speed of at least 2.3 ft/sec in order to improve access to community.  Baseline: 1.9 ft/sec with RW; 2.09 ft/sec with RW 10/22/2023 Goal status: INITIAL    ASSESSMENT:  CLINICAL IMPRESSION: Pt presents today with her RW, but reports the new cane has not yet come in. Skilled PT session focused on gait activities with no device today-dynamic balance and gait activities, with pt having minor LOB and sway, but able to self-correct.  She was most challenged in posterior direction,stepping over obstacles, and four-square step activities.  Pt needs min guard assist>supervision with activities in session today.  No complaints with pain or discomfort at end of session. Pt will continue to benefit from skilled PT towards goals for improved functional mobility and decreased fall risk.  OBJECTIVE IMPAIRMENTS: Abnormal gait, decreased balance, decreased mobility, difficulty walking, decreased strength, impaired flexibility,  postural dysfunction, and pain.   ACTIVITY LIMITATIONS: sitting, standing, sleeping, transfers, bed mobility, and locomotion level  PARTICIPATION LIMITATIONS: community activity  PERSONAL FACTORS: 3+ comorbidities: See PMH above are also affecting patient's functional outcome.   REHAB POTENTIAL: Good  CLINICAL DECISION MAKING: Evolving/moderate complexity  EVALUATION COMPLEXITY: Moderate  PLAN:  PT FREQUENCY: 1-2x/week  PT DURATION: 6 weeks  PLANNED INTERVENTIONS: [id number]- PT Re-evaluation, 97750- Physical Performance Testing, 97110-Therapeutic exercises, 97530- Therapeutic activity, [id number]- Neuromuscular re-education, 97535- Self Care, [id number]- Manual therapy, [id number]- Gait training, [phone]- Canalith repositioning, [id number]- Aquatic Therapy, [phone]- Electrical stimulation (manual), [phone] (1-2 muscles), [id number] (3+ muscles)- Dry Needling, Patient/Family education, Balance training, Stair training, Taping, Joint mobilization, Spinal mobilization, Vestibular training, DME instructions, Cryotherapy, and Moist heat  PLAN FOR NEXT SESSION: Resisted gait, resisted stepping. Four square step activity.  Continue to try interval training of activities- Progress postural strengthening; progress leg strength and balance.  Progress for abdominal and lumbar strengthening.  Gait and balance activities without cane  Consider Additions to HEP    Greig Anon, PT 11/24/23 3:40 PM Phone: [phone] Fax: [phone]  [facility] at [facility] [address] [place], [address] [place], [place] [address] Phone # [phone] Fax # [phone]

## 2023-11-26 ENCOUNTER — Ambulatory Visit: Admitting: Physical Therapy

## 2023-11-26 ENCOUNTER — Encounter: Payer: Self-pay | Admitting: Physical Therapy

## 2023-11-26 DIAGNOSIS — R5383 Other fatigue: Secondary | ICD-10-CM | POA: Diagnosis not present

## 2023-11-26 DIAGNOSIS — R2681 Unsteadiness on feet: Secondary | ICD-10-CM | POA: Diagnosis not present

## 2023-11-26 DIAGNOSIS — R293 Abnormal posture: Secondary | ICD-10-CM

## 2023-11-26 DIAGNOSIS — M6281 Muscle weakness (generalized): Secondary | ICD-10-CM

## 2023-11-26 DIAGNOSIS — Z79899 Other long term (current) drug therapy: Secondary | ICD-10-CM | POA: Diagnosis not present

## 2023-11-26 DIAGNOSIS — M353 Polymyalgia rheumatica: Secondary | ICD-10-CM | POA: Diagnosis not present

## 2023-11-26 DIAGNOSIS — R700 Elevated erythrocyte sedimentation rate: Secondary | ICD-10-CM | POA: Diagnosis not present

## 2023-11-26 NOTE — Therapy (Signed)
 OUTPATIENT PHYSICAL THERAPY NEURO TREATMENT NOTE   Patient Name: Wendy Jackson MRN: [id number] DOB:27-Dec-1945, 78 y.o., female Today's Date: 11/26/2023   PCP: Catherine Charlies LABOR, DO REFERRING PROVIDER: Persons, Ronal Dragon, [place]  (Additional referral from Jerri Kay HERO, MD for [phone] (ICD-10-CM) - Acute pain of right knee M54.50 (ICD-10-CM) - Acute bilateral low back pain without sciatica)       END OF SESSION:  PT End of Session - 11/26/23 1453     Visit Number 15    Number of Visits 21    Date for Recertification  12/04/23    Authorization Type [employer] medicare    Progress Note Due on Visit 19    PT Start Time 1450    PT Stop Time 1530    PT Time Calculation (min) 40 min    Equipment Utilized During Treatment Gait belt    Activity Tolerance Patient tolerated treatment well    Behavior During Therapy WFL for tasks assessed/performed                    Past Medical History:  Diagnosis Date   Cataract 03/2019   Glaucoma Approximately 2005   [place] retinal tear of left eye 04/02/2021   Hyperlipidemia LDL goal <70 01/11/2021   Hypertension 2022   Long-term use of Plaquenil  04/02/2021   Lupus    new rheum questioning original dx. continuing Plaquenil  at lower dose for now.   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 bladder, acquired    Pseudophakia of both eyes 04/02/2021   Retinal detachment with multiple breaks, right eye 04/02/2021   Stroke (HCC) 04/2020   Thyroid  disease    Tibial plateau fracture, right 2025   Past Surgical History:  Procedure Laterality Date   ENDARTERECTOMY Right 05/02/2020   Procedure: RIGHT CAROTID ENDARTERECTOMY;  Surgeon: Oris Krystal FALCON, MD;  Location: [facility] OR;  Service: Vascular;  Laterality: Right;   EYE SURGERY  01/2020   LEFT HEART CATH AND CORONARY ANGIOGRAPHY N/A 02/03/2023   Procedure: LEFT HEART CATH AND CORONARY ANGIOGRAPHY;  Surgeon: Verlin Lonni BIRCH, MD;  Location: [facility] INVASIVE  CV LAB;  Service: Cardiovascular;  Laterality: N/A;   SCLERAL BUCKLE Right 2004   TUBAL LIGATION  Feb 1982   Patient Active Problem List   Diagnosis Date Noted   Incontinence of feces with fecal urgency 05/05/2023   Gastroesophageal reflux disease with esophagitis without hemorrhage 05/05/2023   Bloating 05/05/2023   Other specified abnormal immunological findings in serum 04/03/2023   Elevated erythrocyte sedimentation rate 04/03/2023   High coronary artery calcium  score: 1525; 96th percentile 01/29/2023   SVT (supraventricular tachycardia) 12/31/2022   Decreased exercise tolerance 12/31/2022   Polymyalgia rheumatica 11/25/2022   Other secondary scoliosis, lumbar region 11/17/2022   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   Other female genital prolapse 10/29/2021   Long-term use of Plaquenil  04/02/2021   Primary open angle glaucoma (POAG) of both eyes, severe stage 04/02/2021   Primary hypertension 01/11/2021   Hyperlipidemia LDL goal <70 01/11/2021   Acquired thrombophilia 12/11/2020   BMI less than 19,adult 12/11/2020   Hypothyroidism 12/11/2020   History of CVA (cerebrovascular accident) 05/02/2020   Allergic rhinitis    CAD (coronary artery disease) 05/20/2018    ONSET DATE: 09/04/2023 (MD referral)  REFERRING DIAG: M54.50 (ICD-10-CM) - Acute bilateral low back pain without sciatica  New referral in patient's chart from Dr. Jerri stating Right tibial plateau fracture. WBAT. Knee rehab. Strengthening.  Gait training.   THERAPY DIAG:  No diagnosis found.  Rationale for Evaluation and Treatment: Rehabilitation  SUBJECTIVE:                                                                                                                                                                                             SUBJECTIVE STATEMENT: Brought in my cane today-just the single point not with the quad tip base as I feel less steady with the quad tip base. Pt accompanied by: self and  significant other  PERTINENT HISTORY: followed by Dr. Jerri for a lateral nondisplaced tibial plateau fracture.  She has been having to use a walker and bear minimal weight.  She does have a history of significant degenerative scoliosis and degenerative changes for which she had seen Dr. Barbarann in the past. Also hx of IBS, fecal leakage, has had pelvic floor therapy; polymalgia rheumatica   PAIN:  Are you having pain? No  PRECAUTIONS: Fall and Other: 10/01/23 pt reports she is now full weight bearing on R  RED FLAGS: None   WEIGHT BEARING RESTRICTIONS: None  FALLS: Has patient fallen in last 6 months? Yes. Number of falls 1; 3 falls in past 13 months (all have been tripping)  LIVING ENVIRONMENT: Lives with: lives with their spouse Lives in: House/apartment [place] Stairs: No Has following equipment at home: Single point cane, Environmental Consultant - 2 wheeled, and single leg scooter (unable to use)  PLOF: Independent  PATIENT GOALS: To get back less tight and painful  OBJECTIVE:    TODAY'S TREATMENT: 11/26/2023 Activity Comments  Gait training with cane, then with no device Negotiating around furniture Negotiating around obstacles, over obstaclest supervision  Stair negotiation with step to pattern, rail and cane supervision  Counter balance exercises: Tandem gait forward Tandem gait forward/back Tandem march forward Monster walk forward/back Cues for posture, use of visual target UE support  Minisquats x 10 Minisquats to up on toes x 10 Minisquat to upright posture, holding 2# ball at mid-trunk Cues for abdominal activation  Gait with weighted carry 50 ft x 4 reps no device 50 ft x 2 reps with cane 4#         Access Code: [email or web address] URL: [email or web address] Date: 11/26/2023 Prepared by: [facility] - Outpatient  Rehab - [facility]  Exercises - Supine Pelvic Tilt  - 1-2 x daily - 7 x weekly - 1-2 sets - 5-10 reps - Lower Trunk Rotations  - 1 x daily - 7 x  weekly - 2 sets - 10 reps - Supine Scapular Retraction  - 1 x daily - 7 x weekly - 2  sets - 10 reps - Seated Hamstring Stretch  - 1 x daily - 7 x weekly - 2 sets - 30 sec hold - Standing Scapular Retraction  - 1 x daily - 7 x weekly - 3 sets - 10 reps - Seated Long Arc Quad  - 1 x daily - 7 x weekly - 2 sets - 10 reps - Backward Walking with Counter Support  - 1 x daily - 7 x weekly - 1 sets - 3-5 reps - Forward Step Up  - 1 x daily - 7 x weekly - 3 sets - 5 reps - Dead Bug  - 1 x daily - 7 x weekly - 1 sets - 10 reps - Supine Bridge  - 1 x daily - 7 x weekly - 2-3 sets - 10 reps - Side Stepping with Resistance at Ankles and Counter Support  - 1 x daily - 7 x weekly - 1 sets - 3 reps - Standing 3-Way Leg Reach with Resistance at Ankles and Counter Support  - 1 x daily - 7 x weekly - 3 sets - 10 reps - Tandem Walking with Counter Support  - 1 x daily - 7 x weekly - 1 sets - 5 reps - Mini Squat with Counter Support  - 1 x daily - 5 x weekly - 2 sets - 10 reps PATIENT EDUCATION: Education details: Additions to HEP Person educated: Patient and Spouse Education method: Explanation, Demonstration, Tactile cues, Verbal cues, and Handouts Education comprehension: verbalized understanding and returned demonstration     Note: Objective measures were completed at Evaluation unless otherwise noted.  DIAGNOSTIC FINDINGS: No acute findings per pt/husband report-curvature and severe arthritis  COGNITION: Overall cognitive status: Within functional limits for tasks assessed   SENSATION: Not tested  MUSCLE LENGTH: Hamstrings: Right NT deg; Left -30 deg   LOWER EXTREMITY MMT:     Active (in sitting) Right 9/16 Left 9/16  Hip flexion 4+ 4  Hip extension    Hip abduction 4 4  Hip adduction 4+ 4+  Hip internal rotation    Hip external rotation    Knee flexion 4+ 4+  Knee extension 4+ 5  Ankle dorsiflexion 4 4  Ankle plantarflexion 4+ 4+  Ankle inversion    Ankle eversion     (Blank  rows = not tested)  LOWER EXTREMITY ROM:    ROM Right 9/16 Left 9/16  Knee flexion 134 135  Knee extension 3 0  (Blank rows = not tested)  POSTURE: rounded shoulders, forward head, increased thoracic kyphosis, weight shift left, and L shoulder lower than R   PALPATION:  Tender along lumbar paraspinals bilaterally  BED MOBILITY:  Findings: Tends to go long sit<>supine with pain upon supine; instructed in log roll technique:  supine>L side>sit with min assist and cues  TRANSFERS: Sit to stand: Modified independence  Assistive device utilized: BUE support     Stand to sit: Modified independence  Assistive device utilized: BUE support      GAIT: Findings: Gait Characteristics: per MD note-minimal RLE weightbearing, step to pattern, step through pattern, and decreased stance time- Right, Distance walked: 20 ft, Assistive device utilized:Walker - 2 wheeled, Level of assistance: SBA, and Comments: antalgic pattern  FUNCTIONAL TESTS:  5 times sit to stand: 37.25 sec with BUE support  PATIENT SURVEYS:  Modified Oswestry:  MODIFIED OSWESTRY DISABILITY SCALE  Date: 09/10/2023 Score  Pain intensity 4 =  Pain medication provides me with little relief from pain.  2. Personal  care (washing, dressing, etc.) 2 =  It is painful to take care of myself, and I am slow and careful.  3. Lifting 5 =  I cannot lift or carry anything at all.  4. Walking 4 = I can only walk with crutches or a cane.  5. Sitting 3 =  Pain prevents me from sitting more than  hour.  6. Standing 3 =  Pain prevents me from standing more than 1/2 hour.  7. Sleeping 1 = I can sleep well only by using pain medication.  8. Social Life 5 =  I have hardly any social life because of my pain.  9. Traveling 2 =  My pain restricts my travel over 2 hours.  10. Employment/ Homemaking 3 = Pain prevents me from doing anything but light duties.  Total 32/50= 64%   Interpretation of scores: Score Category Description  0-20% Minimal  Disability The patient can cope with most living activities. Usually no treatment is indicated apart from advice on lifting, sitting and exercise  21-40% Moderate Disability The patient experiences more pain and difficulty with sitting, lifting and standing. Travel and social life are more difficult and they may be disabled from work. Personal care, sexual activity and sleeping are not grossly affected, and the patient can usually be managed by conservative means  41-60% Severe Disability Pain remains the main problem in this group, but activities of daily living are affected. These patients require a detailed investigation  61-80% Crippled Back pain impinges on all aspects of the patient's life. Positive intervention is required  81-100% Bed-bound  These patients are either bed-bound or exaggerating their symptoms  Bluford FORBES Zoe DELENA Karon DELENA, et al. Surgery versus conservative management of stable thoracolumbar fracture: the PRESTO feasibility RCT. [place] ([place]): [employer]; 2021 Nov. [facility] Technology Assessment, No. 25.62.) Appendix 3, Oswestry Disability Index category descriptors. Available from: Findjewelers.cz  Minimally Clinically Important Difference (MCID) = 12.8%       GOALS: Goals reviewed with patient? Yes  SHORT TERM GOALS: Target date: 09/25/2023  Pt will be independent with HEP for improved strength, pain, transfers. Baseline: none; 10/01/23: compliant Goal status: MET  2.  Pt will improve 5x sit<>stand to less than or equal to 30 sec to demonstrate improved functional strength and transfer efficiency. Baseline: 37.25 sec; 24.95 sec with B UE support 10/06/23 Goal status: MET 10/06/23  LONG TERM GOALS: Target date: 10/23/2023>UPDATED TARGET 12/04/2023  Pt will be independent with progression of HEP for improved pain, strength, transfers. Baseline: updated 10/7 Goal status: IN PROGRESS  2.  Pt will improve Modified Oswestry  Score to less than or equal to 50%, to demo less pain interference in daily activities. Baseline: 64% Goal status: IN PROGRESS  3.  Pt will improve 5x sit<>stand to less than or equal to 20 sec to demonstrate improved functional strength and transfer efficiency. Baseline: 26 sec 10/22/2023 (improved from 37 sec) Goal status: IN PROGRESS, 10/22/2023  4.  Pt will rate pain in low back, decreased by at least 50%, for improved ADLs and gait activities. Baseline: much less pain, 10/22/2023 Goal status: MET, 10/22/2023  5. Patient to report tolerance for 30 minutes of standing/walking without device to return to PLOF.  Baseline: reports tolerating 30 min with RW; increased forward lean without device or with cane  Goal status: IN PROGRESS 10/22/2023  6.. Patient to demonstrate gait speed of at least 2.3 ft/sec in order to improve access to community.  Baseline: 1.9 ft/sec with  RW; 2.09 ft/sec with RW 10/22/2023 Goal status: INITIAL    ASSESSMENT:  CLINICAL IMPRESSION: Pt presents today, walking in today with her new SPC. Skilled PT session focused on gait training with cane and with no device, with pt demo improved stability both with and without gait than in several sessions previously.  Worked on chief financial officer exercises with hip stability exercises and able to add to HEP to address more posture/hip stability exercises.  She continues to progress well and she will continue to benefit from skilled PT towards goals for improved functional mobility, independent gait, and decreased fall risk.  OBJECTIVE IMPAIRMENTS: Abnormal gait, decreased balance, decreased mobility, difficulty walking, decreased strength, impaired flexibility, postural dysfunction, and pain.   ACTIVITY LIMITATIONS: sitting, standing, sleeping, transfers, bed mobility, and locomotion level  PARTICIPATION LIMITATIONS: community activity  PERSONAL FACTORS: 3+ comorbidities: See PMH above are also affecting patient's functional  outcome.   REHAB POTENTIAL: Good  CLINICAL DECISION MAKING: Evolving/moderate complexity  EVALUATION COMPLEXITY: Moderate  PLAN:  PT FREQUENCY: 1-2x/week  PT DURATION: 6 weeks  PLANNED INTERVENTIONS: 97164- PT Re-evaluation, 97750- Physical Performance Testing, 97110-Therapeutic exercises, 97530- Therapeutic activity, [id number]- Neuromuscular re-education, 97535- Self Care, [id number]- Manual therapy, [id number]- Gait training, [phone]- Canalith repositioning, [id number]- Aquatic Therapy, [phone]- Electrical stimulation (manual), [phone] (1-2 muscles), [id number] (3+ muscles)- Dry Needling, Patient/Family education, Balance training, Stair training, Taping, Joint mobilization, Spinal mobilization, Vestibular training, DME instructions, Cryotherapy, and Moist heat  PLAN FOR NEXT SESSION: Check LTGs, check updates to HEP; Resisted gait, resisted stepping. Four square step activity.  Continue to try interval training of activities- Progress postural strengthening; progress leg strength and balance.  Progress for abdominal and lumbar strengthening.  Gait and balance activities without cane      Greig Anon, PT 11/26/23 3:43 PM Phone: [phone] Fax: [phone]  [facility] at [facility] [address] [place], [address] [place], [place] [address] Phone # [phone] Fax # [phone]

## 2023-11-29 ENCOUNTER — Encounter: Payer: Self-pay | Admitting: Family Medicine

## 2023-12-01 ENCOUNTER — Encounter: Payer: Self-pay | Admitting: Physical Therapy

## 2023-12-01 ENCOUNTER — Ambulatory Visit: Admitting: Physical Therapy

## 2023-12-01 DIAGNOSIS — R293 Abnormal posture: Secondary | ICD-10-CM

## 2023-12-01 DIAGNOSIS — R2681 Unsteadiness on feet: Secondary | ICD-10-CM | POA: Diagnosis not present

## 2023-12-01 DIAGNOSIS — M6281 Muscle weakness (generalized): Secondary | ICD-10-CM | POA: Diagnosis not present

## 2023-12-01 NOTE — Therapy (Signed)
 OUTPATIENT PHYSICAL THERAPY NEURO TREATMENT NOTE   Patient Name: Wendy Jackson MRN: [id number] DOB:September 28, 1945, 78 y.o., female Today's Date: 12/01/2023   PCP: Catherine Charlies LABOR, DO REFERRING PROVIDER: Persons, Ronal Dragon, [place]  (Additional referral from Jerri Kay HERO, MD for [phone] (ICD-10-CM) - Acute pain of right knee M54.50 (ICD-10-CM) - Acute bilateral low back pain without sciatica)       END OF SESSION:  PT End of Session - 12/01/23 1308     Visit Number 16    Number of Visits 21    Date for Recertification  12/04/23    Authorization Type [employer]BS medicare    Progress Note Due on Visit 19    PT Start Time 1315    PT Stop Time 1403    PT Time Calculation (min) 48 min    Equipment Utilized During Treatment Gait belt    Activity Tolerance Patient tolerated treatment well    Behavior During Therapy WFL for tasks assessed/performed                     Past Medical History:  Diagnosis Date   Cataract 03/2019   Glaucoma Approximately 2005   [place] retinal tear of left eye 04/02/2021   Hyperlipidemia LDL goal <70 01/11/2021   Hypertension 2022   Long-term use of Plaquenil  04/02/2021   Lupus    new rheum questioning original dx. continuing Plaquenil  at lower dose for now.   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 bladder, acquired    Pseudophakia of both eyes 04/02/2021   Retinal detachment with multiple breaks, right eye 04/02/2021   Stroke (HCC) 04/2020   Thyroid  disease    Tibial plateau fracture, right 2025   Past Surgical History:  Procedure Laterality Date   ENDARTERECTOMY Right 05/02/2020   Procedure: RIGHT CAROTID ENDARTERECTOMY;  Surgeon: Oris Krystal FALCON, MD;  Location: [facility] OR;  Service: Vascular;  Laterality: Right;   EYE SURGERY  01/2020   LEFT HEART CATH AND CORONARY ANGIOGRAPHY N/A 02/03/2023   Procedure: LEFT HEART CATH AND CORONARY ANGIOGRAPHY;  Surgeon: Verlin Lonni BIRCH, MD;  Location: [facility] INVASIVE  CV LAB;  Service: Cardiovascular;  Laterality: N/A;   SCLERAL BUCKLE Right 2004   TUBAL LIGATION  Feb 1982   Patient Active Problem List   Diagnosis Date Noted   Incontinence of feces with fecal urgency 05/05/2023   Gastroesophageal reflux disease with esophagitis without hemorrhage 05/05/2023   Bloating 05/05/2023   Other specified abnormal immunological findings in serum 04/03/2023   Elevated erythrocyte sedimentation rate 04/03/2023   High coronary artery calcium  score: 1525; 96th percentile 01/29/2023   SVT (supraventricular tachycardia) 12/31/2022   Decreased exercise tolerance 12/31/2022   Polymyalgia rheumatica 11/25/2022   Other secondary scoliosis, lumbar region 11/17/2022   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   Other female genital prolapse 10/29/2021   Long-term use of Plaquenil  04/02/2021   Primary open angle glaucoma (POAG) of both eyes, severe stage 04/02/2021   Primary hypertension 01/11/2021   Hyperlipidemia LDL goal <70 01/11/2021   Acquired thrombophilia 12/11/2020   BMI less than 19,adult 12/11/2020   Hypothyroidism 12/11/2020   History of CVA (cerebrovascular accident) 05/02/2020   Allergic rhinitis    CAD (coronary artery disease) 05/20/2018    ONSET DATE: 09/04/2023 (MD referral)  REFERRING DIAG: M54.50 (ICD-10-CM) - Acute bilateral low back pain without sciatica  New referral in patient's chart from Dr. Jerri stating Right tibial plateau fracture. WBAT. Knee rehab.  Strengthening. Gait training.   THERAPY DIAG:  Unsteadiness on feet  Muscle weakness (generalized)  Abnormal posture  Rationale for Evaluation and Treatment: Rehabilitation  SUBJECTIVE:                                                                                                                                                                                             SUBJECTIVE STATEMENT: Definitely making progress-using the walker less and less.  Went to the grocery store and walked in with  the cane.  Been doing all the exercises.  Using the cane all the way until bedtime, but definitely am more slumped  by the end of the day.  Waiting until after Thanksgiving to do the Sibo test for abdomen issues.  Pt accompanied by: self and significant other  PERTINENT HISTORY: followed by Dr. Jerri for a lateral nondisplaced tibial plateau fracture.  She has been having to use a walker and bear minimal weight.  She does have a history of significant degenerative scoliosis and degenerative changes for which she had seen Dr. Barbarann in the past. Also hx of IBS, fecal leakage, has had pelvic floor therapy; polymalgia rheumatica   PAIN:  Are you having pain? No  PRECAUTIONS: Fall and Other: 10/01/23 pt reports she is now full weight bearing on R  RED FLAGS: None   WEIGHT BEARING RESTRICTIONS: None  FALLS: Has patient fallen in last 6 months? Yes. Number of falls 1; 3 falls in past 13 months (all have been tripping)  LIVING ENVIRONMENT: Lives with: lives with their spouse Lives in: House/apartment [place] Stairs: No Has following equipment at home: Single point cane, Environmental Consultant - 2 wheeled, and single leg scooter (unable to use)  PLOF: Independent  PATIENT GOALS: To get back less tight and painful  OBJECTIVE:    TODAY'S TREATMENT: 12/01/2023 Activity Comments  Gait activities indoors and outdoors with cane, including ramp, incline, curb then no cane indoors No device, with carry task Supervision/mod I 19 minutes, reports fatigue Several episodes of left lateral LOB, able to regain  Reviewed updates to HEP: Tandem gait minisquats Good return demo  Forward/back tandem walk Tandem march UE support  Oswestry 32% Improved from 64%         M-CTSIB  Condition 1: Firm Surface, EO 30 Sec, Normal Sway  Condition 2: Firm Surface, EC 30 Sec, Normal Sway  Condition 3: Foam Surface, EO 30 Sec, Mild Sway  Condition 4: Foam Surface, EC 30 Sec, Moderate Sway-to L and posterior        Access Code: [email or web address] URL: [email or web address] Date: 11/26/2023 Prepared by: [facility] - Outpatient  Rehab -  [facility]  Exercises - Supine Pelvic Tilt  - 1-2 x daily - 7 x weekly - 1-2 sets - 5-10 reps - Lower Trunk Rotations  - 1 x daily - 7 x weekly - 2 sets - 10 reps - Supine Scapular Retraction  - 1 x daily - 7 x weekly - 2 sets - 10 reps - Seated Hamstring Stretch  - 1 x daily - 7 x weekly - 2 sets - 30 sec hold - Standing Scapular Retraction  - 1 x daily - 7 x weekly - 3 sets - 10 reps - Seated Long Arc Quad  - 1 x daily - 7 x weekly - 2 sets - 10 reps - Backward Walking with Counter Support  - 1 x daily - 7 x weekly - 1 sets - 3-5 reps - Forward Step Up  - 1 x daily - 7 x weekly - 3 sets - 5 reps - Dead Bug  - 1 x daily - 7 x weekly - 1 sets - 10 reps - Supine Bridge  - 1 x daily - 7 x weekly - 2-3 sets - 10 reps - Side Stepping with Resistance at Ankles and Counter Support  - 1 x daily - 7 x weekly - 1 sets - 3 reps - Standing 3-Way Leg Reach with Resistance at Ankles and Counter Support  - 1 x daily - 7 x weekly - 3 sets - 10 reps - Tandem Walking with Counter Support  - 1 x daily - 7 x weekly - 1 sets - 5 reps - Mini Squat with Counter Support  - 1 x daily - 5 x weekly - 2 sets - 10 reps  PATIENT EDUCATION: Education details: Continue current HEP, plans to check LTGs next visit  Person educated: Patient and Spouse Education method: Explanation, Demonstration, Tactile cues, Verbal cues, and Handouts Education comprehension: verbalized understanding and returned demonstration     Note: Objective measures were completed at Evaluation unless otherwise noted.  DIAGNOSTIC FINDINGS: No acute findings per pt/husband report-curvature and severe arthritis  COGNITION: Overall cognitive status: Within functional limits for tasks assessed   SENSATION: Not tested  MUSCLE LENGTH: Hamstrings: Right NT deg; Left -30 deg   LOWER EXTREMITY MMT:      Active (in sitting) Right 9/16 Left 9/16  Hip flexion 4+ 4  Hip extension    Hip abduction 4 4  Hip adduction 4+ 4+  Hip internal rotation    Hip external rotation    Knee flexion 4+ 4+  Knee extension 4+ 5  Ankle dorsiflexion 4 4  Ankle plantarflexion 4+ 4+  Ankle inversion    Ankle eversion     (Blank rows = not tested)  LOWER EXTREMITY ROM:    ROM Right 9/16 Left 9/16  Knee flexion 134 135  Knee extension 3 0  (Blank rows = not tested)  POSTURE: rounded shoulders, forward head, increased thoracic kyphosis, weight shift left, and L shoulder lower than R   PALPATION:  Tender along lumbar paraspinals bilaterally  BED MOBILITY:  Findings: Tends to go long sit<>supine with pain upon supine; instructed in log roll technique:  supine>L side>sit with min assist and cues  TRANSFERS: Sit to stand: Modified independence  Assistive device utilized: BUE support     Stand to sit: Modified independence  Assistive device utilized: BUE support      GAIT: Findings: Gait Characteristics: per MD note-minimal RLE weightbearing, step to pattern, step through pattern, and decreased stance time-  Right, Distance walked: 20 ft, Assistive device utilized:Walker - 2 wheeled, Level of assistance: SBA, and Comments: antalgic pattern  FUNCTIONAL TESTS:  5 times sit to stand: 37.25 sec with BUE support  PATIENT SURVEYS:  Modified Oswestry:  MODIFIED OSWESTRY DISABILITY SCALE  Date: 09/10/2023 Score  Pain intensity 4 =  Pain medication provides me with little relief from pain.  2. Personal care (washing, dressing, etc.) 2 =  It is painful to take care of myself, and I am slow and careful.  3. Lifting 5 =  I cannot lift or carry anything at all.  4. Walking 4 = I can only walk with crutches or a cane.  5. Sitting 3 =  Pain prevents me from sitting more than  hour.  6. Standing 3 =  Pain prevents me from standing more than 1/2 hour.  7. Sleeping 1 = I can sleep well only by using pain  medication.  8. Social Life 5 =  I have hardly any social life because of my pain.  9. Traveling 2 =  My pain restricts my travel over 2 hours.  10. Employment/ Homemaking 3 = Pain prevents me from doing anything but light duties.  Total 32/50= 64%   Interpretation of scores: Score Category Description  0-20% Minimal Disability The patient can cope with most living activities. Usually no treatment is indicated apart from advice on lifting, sitting and exercise  21-40% Moderate Disability The patient experiences more pain and difficulty with sitting, lifting and standing. Travel and social life are more difficult and they may be disabled from work. Personal care, sexual activity and sleeping are not grossly affected, and the patient can usually be managed by conservative means  41-60% Severe Disability Pain remains the main problem in this group, but activities of daily living are affected. These patients require a detailed investigation  61-80% Crippled Back pain impinges on all aspects of the patient's life. Positive intervention is required  81-100% Bed-bound  These patients are either bed-bound or exaggerating their symptoms  Bluford FORBES Zoe DELENA Karon DELENA, et al. Surgery versus conservative management of stable thoracolumbar fracture: the PRESTO feasibility RCT. [place] ([place]): [employer]; 2021 Nov. [facility] Technology Assessment, No. 25.62.) Appendix 3, Oswestry Disability Index category descriptors. Available from: Findjewelers.cz  Minimally Clinically Important Difference (MCID) = 12.8%       GOALS: Goals reviewed with patient? Yes  SHORT TERM GOALS: Target date: 09/25/2023  Pt will be independent with HEP for improved strength, pain, transfers. Baseline: none; 10/01/23: compliant Goal status: MET  2.  Pt will improve 5x sit<>stand to less than or equal to 30 sec to demonstrate improved functional strength and transfer efficiency. Baseline:  37.25 sec; 24.95 sec with B UE support 10/06/23 Goal status: MET 10/06/23  LONG TERM GOALS: Target date: 10/23/2023>UPDATED TARGET 12/04/2023  Pt will be independent with progression of HEP for improved pain, strength, transfers. Baseline: updated 10/7 Goal status: IN PROGRESS  2.  Pt will improve Modified Oswestry Score to less than or equal to 50%, to demo less pain interference in daily activities. Baseline: 64%>32% Goal status: MET 12/01/2023  3.  Pt will improve 5x sit<>stand to less than or equal to 20 sec to demonstrate improved functional strength and transfer efficiency. Baseline: 26 sec 10/22/2023 (improved from 37 sec) Goal status: IN PROGRESS, 10/22/2023  4.  Pt will rate pain in low back, decreased by at least 50%, for improved ADLs and gait activities. Baseline: much less pain, 10/22/2023 Goal  status: MET, 10/22/2023  5. Patient to report tolerance for 30 minutes of standing/walking without device to return to PLOF.  Baseline: reports tolerating 30 min with RW; increased forward lean without device or with cane; fatigue reported after 19 min gait with cane/no device Goal status: PARTIALLY MET, 12/01/2023  6.. Patient to demonstrate gait speed of at least 2.3 ft/sec in order to improve access to community.  Baseline: 1.9 ft/sec with RW; 2.09 ft/sec with RW 10/22/2023 Goal status: INITIAL    ASSESSMENT:  CLINICAL IMPRESSION: Pt presents today with continued reports of using cane more and more.  She does get more fatigued at end of day, per her report, and her posture is more forward by the end of the day, but she is not using the RW nearly as much.  She is able to ambulate 19 minutes prior to needing a sitting break, because of overall fatigue (not pain), with LTG 5 partially met.  She has met LTG 2, with improvement in Oswestry from 64>32%.  Will plan to assess remaining LTGs next visit and discuss further goals to improve postural and lower extremity strength to continue to  optimize functional mobility and balance.    OBJECTIVE IMPAIRMENTS: Abnormal gait, decreased balance, decreased mobility, difficulty walking, decreased strength, impaired flexibility, postural dysfunction, and pain.   ACTIVITY LIMITATIONS: sitting, standing, sleeping, transfers, bed mobility, and locomotion level  PARTICIPATION LIMITATIONS: community activity  PERSONAL FACTORS: 3+ comorbidities: See PMH above are also affecting patient's functional outcome.   REHAB POTENTIAL: Good  CLINICAL DECISION MAKING: Evolving/moderate complexity  EVALUATION COMPLEXITY: Moderate  PLAN:  PT FREQUENCY: 1-2x/week  PT DURATION: 6 weeks  PLANNED INTERVENTIONS: [id number]- PT Re-evaluation, 97750- Physical Performance Testing, 97110-Therapeutic exercises, 97530- Therapeutic activity, [id number]- Neuromuscular re-education, 97535- Self Care, [id number]- Manual therapy, [id number]- Gait training, [id number]- Canalith repositioning, [id number]- Aquatic Therapy, [phone]- Electrical stimulation (manual), [phone] (1-2 muscles), [id number] (3+ muscles)- Dry Needling, Patient/Family education, Balance training, Stair training, Taping, Joint mobilization, Spinal mobilization, Vestibular training, DME instructions, Cryotherapy, and Moist heat  PLAN FOR NEXT SESSION: Check LTGs and renew, progress postural strengthening ex as part of HEP.   DGI and 6 MWT for goals. Progress postural strengthening; progress leg strength and balance.  Progress for abdominal and lumbar strengthening.  Gait and balance activities without cane      Greig Anon, PT 12/01/23 2:56 PM Phone: [phone] Fax: [phone]  [facility] at [facility] [address], [address] [place], [place] [address] Phone # [phone] Fax # [phone]

## 2023-12-03 ENCOUNTER — Encounter: Payer: Self-pay | Admitting: Physical Therapy

## 2023-12-03 ENCOUNTER — Ambulatory Visit: Admitting: Physical Therapy

## 2023-12-03 DIAGNOSIS — M6281 Muscle weakness (generalized): Secondary | ICD-10-CM | POA: Diagnosis not present

## 2023-12-03 DIAGNOSIS — R2681 Unsteadiness on feet: Secondary | ICD-10-CM | POA: Diagnosis not present

## 2023-12-03 DIAGNOSIS — R293 Abnormal posture: Secondary | ICD-10-CM

## 2023-12-03 NOTE — Therapy (Signed)
 OUTPATIENT PHYSICAL THERAPY NEURO TREATMENT NOTE/PROGRESS NOTE/RECERT   Patient Name: Wendy Jackson MRN: [id number] DOB:[birth year], 78 y.o., female Today's Date: 12/03/2023   PCP: Catherine Charlies LABOR, DO REFERRING PROVIDER: Persons, Ronal Dragon, [place]  (Additional referral from Jerri Kay HERO, MD for [phone] (ICD-10-CM) - Acute pain of right knee M54.50 (ICD-10-CM) - Acute bilateral low back pain without sciatica)   Progress Note Reporting Period 10/22/2023 to 12/03/2023  See note below for Objective Data and Assessment of Progress/Goals.        END OF SESSION:  PT End of Session - 12/03/23 1305     Visit Number 17    Number of Visits 22    Date for Recertification  01/08/24    Authorization Type [employer] medicare    Progress Note Due on Visit 27    PT Start Time 1310    PT Stop Time 1350    PT Time Calculation (min) 40 min    Equipment Utilized During Treatment Gait belt    Activity Tolerance Patient tolerated treatment well    Behavior During Therapy WFL for tasks assessed/performed                     Past Medical History:  Diagnosis Date   Cataract 03/2019   Glaucoma Approximately 2005   [place] retinal tear of left eye 04/02/2021   Hyperlipidemia LDL goal <70 01/11/2021   Hypertension 2022   Long-term use of Plaquenil  04/02/2021   Lupus    new rheum questioning original dx. continuing Plaquenil  at lower dose for now.   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 bladder, acquired    Pseudophakia of both eyes 04/02/2021   Retinal detachment with multiple breaks, right eye 04/02/2021   Stroke (HCC) 04/2020   Thyroid  disease    Tibial plateau fracture, right 2025   Past Surgical History:  Procedure Laterality Date   ENDARTERECTOMY Right 05/02/2020   Procedure: RIGHT CAROTID ENDARTERECTOMY;  Surgeon: Oris Krystal FALCON, MD;  Location: [facility] OR;  Service: Vascular;  Laterality: Right;   EYE SURGERY  01/2020   LEFT HEART CATH AND  CORONARY ANGIOGRAPHY N/A 02/03/2023   Procedure: LEFT HEART CATH AND CORONARY ANGIOGRAPHY;  Surgeon: Verlin Lonni BIRCH, MD;  Location: [facility] IN[facility]ASIVE [facility];  Service: Cardiovascular;  Laterality: N/A;   SCLERAL BUCKLE Right 2004   TUBAL LIGATION  Feb 1982   Patient Active Problem List   Diagnosis Date Noted   Incontinence of feces with fecal urgency 05/05/2023   Gastroesophageal reflux disease with esophagitis without hemorrhage 05/05/2023   Bloating 05/05/2023   Other specified abnormal immunological findings in serum 04/03/2023   Elevated erythrocyte sedimentation rate 04/03/2023   High coronary artery calcium  score: 1525; 96th percentile 01/29/2023   SVT (supraventricular tachycardia) 12/31/2022   Decreased exercise tolerance 12/31/2022   Polymyalgia rheumatica 11/25/2022   Other secondary scoliosis, lumbar region 11/17/2022   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   Other female genital prolapse 10/29/2021   Long-term use of Plaquenil  04/02/2021   Primary open angle glaucoma (POAG) of both eyes, severe stage 04/02/2021   Primary hypertension 01/11/2021   Hyperlipidemia LDL goal <70 01/11/2021   Acquired thrombophilia 12/11/2020   BMI less than 19,adult 12/11/2020   Hypothyroidism 12/11/2020   History of CVA (cerebrovascular accident) 05/02/2020   Allergic rhinitis    CAD (coronary artery disease) 05/20/2018    ONSET DATE: 09/04/2023 (MD referral)  REFERRING DIAG: M54.50 (ICD-10-CM) - Acute bilateral  low back pain without sciatica  New referral in patient's chart from Dr. Jerri stating Right tibial plateau fracture. WBAT. Knee rehab. Strengthening. Gait training.   THERAPY DIAG:  Unsteadiness on feet  Muscle weakness (generalized)  Abnormal posture  Rationale for Evaluation and Treatment: Rehabilitation  SUBJECTIVE:                                                                                                                                                                                              SUBJECTIVE STATEMENT: Did all the exercises yesterday.  Really not using the RW anymore.  Pt accompanied by: self and significant other  PERTINENT HISTORY: followed by Dr. Jerri for a lateral nondisplaced tibial plateau fracture.  She has been having to use a walker and bear minimal weight.  She does have a history of significant degenerative scoliosis and degenerative changes for which she had seen Dr. Barbarann in the past. Also hx of IBS, fecal leakage, has had pelvic floor therapy; polymalgia rheumatica   PAIN:  Are you having pain? No  PRECAUTIONS: Fall and Other: 10/01/23 pt reports she is now full weight bearing on R  RED FLAGS: None   WEIGHT BEARING RESTRICTIONS: None  FALLS: Has patient fallen in last 6 months? Yes. Number of falls 1; 3 falls in past 13 months (all have been tripping)  LIVING ENVIRONMENT: Lives with: lives with their spouse Lives in: House/apartment [place] Stairs: No Has following equipment at home: Single point cane, Walker - 2 wheeled, and single leg scooter (unable to use)  PLOF: Independent  PATIENT GOALS: To get back less tight and painful; 12/03/2023-to be able to walk again with cane or less.  OBJECTIVE:     TODAY'S TREATMENT: 12/03/2023 Activity Comments  FTSTS:  23.78 sec  Arms crossed at chest-improved from 26 sec  DGI 16/24 Improved from 12/24  Gait velocity :  14.81 with cane (2.21 ft/sec) 14.03 sec no device (2.34 ft/sec)   6 MWT:  836 ft, with cane 1st min 150 ft 6th min 114 ft  Seated postural exercises: Horizontal shoulder adduction Scapular retraction Paloff press Used yellow theraband and then 1# weights  Alt UE/leg lifts in sitting with abdominal activation    Access Code: [email or web address] URL: [email or web address] Date: 12/03/2023 Prepared by: [facility] - Outpatient  Rehab - [facility]  Exercises - Lower Trunk Rotations  - 1 x daily - 7 x weekly - 2 sets - 10 reps - Seated Hamstring  Stretch  - 1 x daily - 7 x weekly - 2 sets - 30 sec hold - Standing Scapular Retraction  - 1 x  daily - 7 x weekly - 3 sets - 10 reps - Seated Long Arc Quad  - 1 x daily - 7 x weekly - 2 sets - 10 reps - Backward Walking with Counter Support  - 1 x daily - 7 x weekly - 1 sets - 3-5 reps - Forward Step Up  - 1 x daily - 7 x weekly - 3 sets - 5 reps - Supine Bridge  - 1 x daily - 7 x weekly - 2-3 sets - 10 reps - Side Stepping with Resistance at Ankles and Counter Support  - 1 x daily - 7 x weekly - 1 sets - 3 reps - Standing 3-Way Leg Reach with Resistance at Ankles and Counter Support  - 1 x daily - 7 x weekly - 3 sets - 10 reps - Tandem Walking with Counter Support  - 1 x daily - 7 x weekly - 1 sets - 5 reps - Mini Squat with Counter Support  - 1 x daily - 5 x weekly - 2 sets - 10 reps - Seated Scapular Retraction  - 1 x daily - 3 x weekly - 3 sets - 5 reps - Seated Shoulder Horizontal Abduction and Adduction  - 1 x daily - 3 x weekly - 3 sets - 5 reps - Seated march with arm raise  - 1 x daily - 3 x weekly - 3 sets - 5 reps   TREATMENT: 12/01/2023 Activity Comments  Gait activities indoors and outdoors with cane, including ramp, incline, curb then no cane indoors No device, with carry task Supervision/mod I 19 minutes, reports fatigue Several episodes of left lateral LOB, able to regain  Reviewed updates to HEP: Tandem gait minisquats Good return demo  Forward/back tandem walk Tandem march UE support  Oswestry 32% Improved from 64%         M-CTSIB  Condition 1: Firm Surface, EO 30 Sec, Normal Sway  Condition 2: Firm Surface, EC 30 Sec, Normal Sway  Condition 3: Foam Surface, EO 30 Sec, Mild Sway  Condition 4: Foam Surface, EC 30 Sec, Moderate Sway-to L and posterior        PATIENT EDUCATION: Education details: 12/03/23 Consolidation/update of HEP, progress towards goals, POC  Person educated: Patient and Spouse Education method: Explanation, Demonstration, Tactile cues,  Verbal cues, and Handouts Education comprehension: verbalized understanding and returned demonstration     Note: Objective measures were completed at Evaluation unless otherwise noted.  DIAGNOSTIC FINDINGS: No acute findings per pt/husband report-curvature and severe arthritis  COGNITION: Overall cognitive status: Within functional limits for tasks assessed   SENSATION: Not tested  MUSCLE LENGTH: Hamstrings: Right NT deg; Left -30 deg   LOWER EXTREMITY MMT:     Active (in sitting) Right 9/16 Left 9/16  Hip flexion 4+ 4  Hip extension    Hip abduction 4 4  Hip adduction 4+ 4+  Hip internal rotation    Hip external rotation    Knee flexion 4+ 4+  Knee extension 4+ 5  Ankle dorsiflexion 4 4  Ankle plantarflexion 4+ 4+  Ankle inversion    Ankle eversion     (Blank rows = not tested)  LOWER EXTREMITY ROM:    ROM Right 9/16 Left 9/16  Knee flexion 134 135  Knee extension 3 0  (Blank rows = not tested)  POSTURE: rounded shoulders, forward head, increased thoracic kyphosis, weight shift left, and L shoulder lower than R   PALPATION:  Tender along lumbar paraspinals bilaterally  BED MOBILITY:  Findings: Tends to go long sit<>supine with pain upon supine; instructed in log roll technique:  supine>L side>sit with min assist and cues  TRANSFERS: Sit to stand: Modified independence  Assistive device utilized: BUE support     Stand to sit: Modified independence  Assistive device utilized: BUE support      GAIT: Findings: Gait Characteristics: per MD note-minimal RLE weightbearing, step to pattern, step through pattern, and decreased stance time- Right, Distance walked: 20 ft, Assistive device utilized:Walker - 2 wheeled, Level of assistance: SBA, and Comments: antalgic pattern  FUNCTIONAL TESTS:  5 times sit to stand: 37.25 sec with BUE support  PATIENT SURVEYS:  Modified Oswestry:  MODIFIED OSWESTRY DISABILITY SCALE  Date: 09/10/2023 Score  Pain intensity 4 =   Pain medication provides me with little relief from pain.  2. Personal care (washing, dressing, etc.) 2 =  It is painful to take care of myself, and I am slow and careful.  3. Lifting 5 =  I cannot lift or carry anything at all.  4. Walking 4 = I can only walk with crutches or a cane.  5. Sitting 3 =  Pain prevents me from sitting more than  hour.  6. Standing 3 =  Pain prevents me from standing more than 1/2 hour.  7. Sleeping 1 = I can sleep well only by using pain medication.  8. Social Life 5 =  I have hardly any social life because of my pain.  9. Traveling 2 =  My pain restricts my travel over 2 hours.  10. Employment/ Homemaking 3 = Pain prevents me from doing anything but light duties.  Total 32/50= 64%   Interpretation of scores: Score Category Description  0-20% Minimal Disability The patient can cope with most living activities. Usually no treatment is indicated apart from advice on lifting, sitting and exercise  21-40% Moderate Disability The patient experiences more pain and difficulty with sitting, lifting and standing. Travel and social life are more difficult and they may be disabled from work. Personal care, sexual activity and sleeping are not grossly affected, and the patient can usually be managed by conservative means  41-60% Severe Disability Pain remains the main problem in this group, but activities of daily living are affected. These patients require a detailed investigation  61-80% Crippled Back pain impinges on all aspects of the patient's life. Positive intervention is required  81-100% Bed-bound  These patients are either bed-bound or exaggerating their symptoms  Bluford FORBES Zoe DELENA Karon DELENA, et al. Surgery versus conservative management of stable thoracolumbar fracture: the PRESTO feasibility RCT. [place] ([place]): [employer]; 2021 Nov. [facility] Technology Assessment, No. 25.62.) Appendix 3, Oswestry Disability Index category descriptors. Available from:  Findjewelers.cz  Minimally Clinically Important Difference (MCID) = 12.8%       GOALS: Goals reviewed with patient? Yes  SHORT TERM GOALS: Target date: 09/25/2023  Pt will be independent with HEP for improved strength, pain, transfers. Baseline: none; 10/01/23: compliant Goal status: MET  2.  Pt will improve 5x sit<>stand to less than or equal to 30 sec to demonstrate improved functional strength and transfer efficiency. Baseline: 37.25 sec; 24.95 sec with B UE support 10/06/23 Goal status: MET 10/06/23  LONG TERM GOALS: Target date: 10/23/2023>UPDATED TARGET 12/04/2023>UPDATED TARGET 01/08/2024  Pt will be independent with progression of HEP for improved pain, strength, transfers. Baseline:  Goal status: MET 11/2023  2.  Pt will improve Modified Oswestry Score to less than or equal to  50%, to demo less pain interference in daily activities. Baseline: 64%>32% Goal status: MET 12/01/2023  3.  Pt will improve 5x sit<>stand to less than or equal to 20 sec to demonstrate improved functional strength and transfer efficiency. Baseline: 26 sec 10/22/2023 (improved from 37 sec)> 23.78 sec 12/03/2023 Goal status: IN PROGRESS, 12/03/2023  4.  Pt will rate pain in low back, decreased by at least 50%, for improved ADLs and gait activities. Baseline: much less pain, 10/22/2023 Goal status: MET, 10/22/2023  5. Patient to report tolerance for 30 minutes of standing/walking without device to return to PLOF.  Baseline: reports tolerating 30 min with RW; increased forward lean without device or with cane; fatigue reported after 19 min gait with cane/no device Goal status: PARTIALLY MET, 12/01/2023  6.. Patient to demonstrate gait speed of at least 2.3 ft/sec in order to improve access to community.  Baseline: 1.9 ft/sec with RW; 2.09 ft/sec with RW 10/22/2023>2.21 ft/sec with cane 12/03/23 Goal status: IN PROGRESS 12/03/2023  7.  Pt will improve DGI score to at least  20/24 to decrease fall risk.  Baseline:  16/24  Goal status:  INITIAL  8.  Pt will ambulate at least 920 ft in 6 MWT for improved gait efficiency and endurance for community gait.  Baseline:  836 ft with increased forward posture, slowed pace last minute  Goal status:  INITIAL    ASSESSMENT:  CLINICAL IMPRESSION: Pt presents today with no new complaints. Skilled PT session focused on assessing remaining LTGs.  She has met 3 of 6 LTGs, with improved back pain noted with gait and daily activities.  She is progressing with FTSTS and gait velocity measures, just not to goal level.  DGI assessed with score 16/24, indicating increased fall risk.  She has progressed to primarily using cane with gait, but again, is at increased fall risk per DGI and FTSTS measures.  6WMT indicates decreased gait distance compared to like age peers, and she demo increased forward posture and decreased distance in last minute of test compared to first minute of test.  She feels good about progress with therapy; however, pt and husband note that her balance is a limiting factor with progression towards independence; pt will continue to benefit from skilled PT towards goals for improved functional mobility, posture, strength, and decreased fall risk.    OBJECTIVE IMPAIRMENTS: Abnormal gait, decreased balance, decreased mobility, difficulty walking, decreased strength, impaired flexibility, postural dysfunction, and pain.   ACTIVITY LIMITATIONS: sitting, standing, sleeping, transfers, bed mobility, and locomotion level  PARTICIPATION LIMITATIONS: community activity  PERSONAL FACTORS: 3+ comorbidities: See PMH above are also affecting patient's functional outcome.   REHAB POTENTIAL: Good  CLINICAL DECISION MAKING: Evolving/moderate complexity  EVALUATION COMPLEXITY: Moderate  PLAN:  PT FREQUENCY: 1-2x/week  PT DURATION: 6 weeks  PLANNED INTERVENTIONS: 97164- PT Re-evaluation, 97750- Physical Performance  Testing, 97110-Therapeutic exercises, 97530- Therapeutic activity, [id number]- Neuromuscular re-education, 97535- Self Care, [id number]- Manual therapy, [id number]- Gait training, [id number]- Canalith repositioning, [id number]- Aquatic Therapy, [phone]- Electrical stimulation (manual), [phone] (1-2 muscles), [id number] (3+ muscles)- Dry Needling, Patient/Family education, Balance training, Stair training, Taping, Joint mobilization, Spinal mobilization, Vestibular training, DME instructions, Cryotherapy, and Moist heat  PLAN FOR NEXT SESSION: Review updates to HEP.   Progress postural strengthening; progress leg strength and balance.  Progress for abdominal and lumbar strengthening (try step taps, step ups with holding weighted ball).  Gait and balance activities without cane, weighted carry task.    Greig Anon, PT 12/03/23 1:09 PM Phone: [phone] Fax:  [phone]  [facility] at [facility] [address] [place], [place] [address] Phone # [phone] Fax # [phone]

## 2023-12-15 ENCOUNTER — Ambulatory Visit: Admitting: Physical Therapy

## 2023-12-15 DIAGNOSIS — R140 Abdominal distension (gaseous): Secondary | ICD-10-CM | POA: Diagnosis not present

## 2023-12-15 DIAGNOSIS — R110 Nausea: Secondary | ICD-10-CM | POA: Diagnosis not present

## 2023-12-17 ENCOUNTER — Ambulatory Visit: Attending: Physician Assistant | Admitting: Physical Therapy

## 2023-12-17 ENCOUNTER — Encounter: Payer: Self-pay | Admitting: Physical Therapy

## 2023-12-17 DIAGNOSIS — R293 Abnormal posture: Secondary | ICD-10-CM | POA: Insufficient documentation

## 2023-12-17 DIAGNOSIS — M6281 Muscle weakness (generalized): Secondary | ICD-10-CM | POA: Insufficient documentation

## 2023-12-17 DIAGNOSIS — R2681 Unsteadiness on feet: Secondary | ICD-10-CM | POA: Insufficient documentation

## 2023-12-17 NOTE — Therapy (Signed)
 OUTPATIENT PHYSICAL THERAPY NEURO TREATMENT NOTE  Patient Name: Wendy Jackson MRN: [id number] DOB:1945/08/16, 78 y.o., female Today's Date: 12/17/2023   PCP: Catherine Charlies LABOR, DO REFERRING PROVIDER: Persons, Ronal Dragon, [place]  (Additional referral from Jerri Kay HERO, MD for [phone] (ICD-10-CM) - Acute pain of right knee M54.50 (ICD-10-CM) - Acute bilateral low back pain without sciatica)         END OF SESSION:  PT End of Session - 12/17/23 1018     Visit Number 18    Number of Visits 22    Date for Recertification  01/08/24    Authorization Type [employer]BS medicare    Progress Note Due on Visit 27    PT Start Time 1020    PT Stop Time 1059    PT Time Calculation (min) 39 min    Equipment Utilized During Treatment Gait belt    Activity Tolerance Patient tolerated treatment well    Behavior During Therapy WFL for tasks assessed/performed                      Past Medical History:  Diagnosis Date   Cataract 03/2019   Glaucoma Approximately 2005   [place] retinal tear of left eye 04/02/2021   Hyperlipidemia LDL goal <70 01/11/2021   Hypertension 2022   Long-term use of Plaquenil  04/02/2021   Lupus    new rheum questioning original dx. continuing Plaquenil  at lower dose for now.   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 bladder, acquired    Pseudophakia of both eyes 04/02/2021   Retinal detachment with multiple breaks, right eye 04/02/2021   Stroke (HCC) 04/2020   Thyroid  disease    Tibial plateau fracture, right 2025   Past Surgical History:  Procedure Laterality Date   ENDARTERECTOMY Right 05/02/2020   Procedure: RIGHT CAROTID ENDARTERECTOMY;  Surgeon: Oris Krystal FALCON, MD;  Location: [facility] OR;  Service: Vascular;  Laterality: Right;   EYE SURGERY  01/2020   LEFT HEART CATH AND CORONARY ANGIOGRAPHY N/A 02/03/2023   Procedure: LEFT HEART CATH AND CORONARY ANGIOGRAPHY;  Surgeon: Verlin Lonni BIRCH, MD;  Location: [facility]  INVASIVE CV LAB;  Service: Cardiovascular;  Laterality: N/A;   SCLERAL BUCKLE Right 2004   TUBAL LIGATION  Feb 1982   Patient Active Problem List   Diagnosis Date Noted   Incontinence of feces with fecal urgency 05/05/2023   Gastroesophageal reflux disease with esophagitis without hemorrhage 05/05/2023   Bloating 05/05/2023   Other specified abnormal immunological findings in serum 04/03/2023   Elevated erythrocyte sedimentation rate 04/03/2023   High coronary artery calcium  score: 1525; 96th percentile 01/29/2023   SVT (supraventricular tachycardia) 12/31/2022   Decreased exercise tolerance 12/31/2022   Polymyalgia rheumatica 11/25/2022   Other secondary scoliosis, lumbar region 11/17/2022   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   Other female genital prolapse 10/29/2021   Long-term use of Plaquenil  04/02/2021   Primary open angle glaucoma (POAG) of both eyes, severe stage 04/02/2021   Primary hypertension 01/11/2021   Hyperlipidemia LDL goal <70 01/11/2021   Acquired thrombophilia 12/11/2020   BMI less than 19,adult 12/11/2020   Hypothyroidism 12/11/2020   History of CVA (cerebrovascular accident) 05/02/2020   Allergic rhinitis    CAD (coronary artery disease) 05/20/2018    ONSET DATE: 09/04/2023 (MD referral)  REFERRING DIAG: M54.50 (ICD-10-CM) - Acute bilateral low back pain without sciatica  New referral in patient's chart from Dr. Jerri stating Right tibial plateau fracture. WBAT.  Knee rehab. Strengthening. Gait training.   THERAPY DIAG:  Unsteadiness on feet  Muscle weakness (generalized)  Abnormal posture  Rationale for Evaluation and Treatment: Rehabilitation  SUBJECTIVE:                                                                                                                                                                                             SUBJECTIVE STATEMENT: Did all my exercises yesterday and did a lot of walking.  Feeling pretty good.  Pt  accompanied by: self and significant other  PERTINENT HISTORY: followed by Dr. Jerri for a lateral nondisplaced tibial plateau fracture.  She has been having to use a walker and bear minimal weight.  She does have a history of significant degenerative scoliosis and degenerative changes for which she had seen Dr. Barbarann in the past. Also hx of IBS, fecal leakage, has had pelvic floor therapy; polymalgia rheumatica   PAIN:  Are you having pain? No  PRECAUTIONS: Fall and Other: 10/01/23 pt reports she is now full weight bearing on R  RED FLAGS: None   WEIGHT BEARING RESTRICTIONS: None  FALLS: Has patient fallen in last 6 months? Yes. Number of falls 1; 3 falls in past 13 months (all have been tripping)  LIVING ENVIRONMENT: Lives with: lives with their spouse Lives in: House/apartment [place] Stairs: No Has following equipment at home: Single point cane, Walker - 2 wheeled, and single leg scooter (unable to use)  PLOF: Independent  PATIENT GOALS: To get back less tight and painful; 12/03/2023-to be able to walk again with cane or less.  OBJECTIVE:     TODAY'S TREATMENT: 12/17/2023 Activity Comments  Review of posture exercises: Seated scapular retraction x 10 Seated horizontal adduction x 10 Alt UE/leg lifts with core stabilization Good return demo  Sit to stand from elevated mat, holding 2.2# ball, 3 x 5 reps Cues for forward lean for ease of transfer  Wall squats with red therapy ball behind back, 2 x 10 reps Cues for technique, good tolerance  Gait with weigthed carry:   50 ft x 8 reps 4#, single UE  Forward/back gait with head turns supervision  Gait with obstacle negotiation-stepping over and stepping around Low hurdles, supervision and some difficulty initially clearing obstacles, but better with repetition  Squats to pick up objects from floor x 5 reps Cues for wider BOS and good steadiness throughout   No pain at end of session   Access Code: [email or web address] URL:  [email or web address] Date: 12/03/2023 Prepared by: [facility] - Outpatient  Rehab - [facility]  Exercises - Lower Trunk Rotations  - 1 x daily -  7 x weekly - 2 sets - 10 reps - Seated Hamstring Stretch  - 1 x daily - 7 x weekly - 2 sets - 30 sec hold - Standing Scapular Retraction  - 1 x daily - 7 x weekly - 3 sets - 10 reps - Seated Long Arc Quad  - 1 x daily - 7 x weekly - 2 sets - 10 reps - Backward Walking with Counter Support  - 1 x daily - 7 x weekly - 1 sets - 3-5 reps - Forward Step Up  - 1 x daily - 7 x weekly - 3 sets - 5 reps - Supine Bridge  - 1 x daily - 7 x weekly - 2-3 sets - 10 reps - Side Stepping with Resistance at Ankles and Counter Support  - 1 x daily - 7 x weekly - 1 sets - 3 reps - Standing 3-Way Leg Reach with Resistance at Ankles and Counter Support  - 1 x daily - 7 x weekly - 3 sets - 10 reps - Tandem Walking with Counter Support  - 1 x daily - 7 x weekly - 1 sets - 5 reps - Mini Squat with Counter Support  - 1 x daily - 5 x weekly - 2 sets - 10 reps - Seated Scapular Retraction  - 1 x daily - 3 x weekly - 3 sets - 5 reps - Seated Shoulder Horizontal Abduction and Adduction  - 1 x daily - 3 x weekly - 3 sets - 5 reps - Seated march with arm raise  - 1 x daily - 3 x weekly - 3 sets - 5 reps          PATIENT EDUCATION: Education details: Continue current HEP and continue to progress general activity level Person educated: Patient and Spouse Education method: Explanation, Demonstration, Tactile cues, Verbal cues, and Handouts Education comprehension: verbalized understanding and returned demonstration     Note: Objective measures were completed at Evaluation unless otherwise noted.  DIAGNOSTIC FINDINGS: No acute findings per pt/husband report-curvature and severe arthritis  COGNITION: Overall cognitive status: Within functional limits for tasks assessed   SENSATION: Not tested  MUSCLE LENGTH: Hamstrings: Right NT deg; Left -30  deg   LOWER EXTREMITY MMT:     Active (in sitting) Right 9/16 Left 9/16  Hip flexion 4+ 4  Hip extension    Hip abduction 4 4  Hip adduction 4+ 4+  Hip internal rotation    Hip external rotation    Knee flexion 4+ 4+  Knee extension 4+ 5  Ankle dorsiflexion 4 4  Ankle plantarflexion 4+ 4+  Ankle inversion    Ankle eversion     (Blank rows = not tested)  LOWER EXTREMITY ROM:    ROM Right 9/16 Left 9/16  Knee flexion 134 135  Knee extension 3 0  (Blank rows = not tested)  POSTURE: rounded shoulders, forward head, increased thoracic kyphosis, weight shift left, and L shoulder lower than R   PALPATION:  Tender along lumbar paraspinals bilaterally  BED MOBILITY:  Findings: Tends to go long sit<>supine with pain upon supine; instructed in log roll technique:  supine>L side>sit with min assist and cues  TRANSFERS: Sit to stand: Modified independence  Assistive device utilized: BUE support     Stand to sit: Modified independence  Assistive device utilized: BUE support      GAIT: Findings: Gait Characteristics: per MD note-minimal RLE weightbearing, step to pattern, step through pattern, and decreased stance time- Right,  Distance walked: 20 ft, Assistive device utilized:Walker - 2 wheeled, Level of assistance: SBA, and Comments: antalgic pattern  FUNCTIONAL TESTS:  5 times sit to stand: 37.25 sec with BUE support  PATIENT SURVEYS:  Modified Oswestry:  MODIFIED OSWESTRY DISABILITY SCALE  Date: 09/10/2023 Score  Pain intensity 4 =  Pain medication provides me with little relief from pain.  2. Personal care (washing, dressing, etc.) 2 =  It is painful to take care of myself, and I am slow and careful.  3. Lifting 5 =  I cannot lift or carry anything at all.  4. Walking 4 = I can only walk with crutches or a cane.  5. Sitting 3 =  Pain prevents me from sitting more than  hour.  6. Standing 3 =  Pain prevents me from standing more than 1/2 hour.  7. Sleeping 1 = I can  sleep well only by using pain medication.  8. Social Life 5 =  I have hardly any social life because of my pain.  9. Traveling 2 =  My pain restricts my travel over 2 hours.  10. Employment/ Homemaking 3 = Pain prevents me from doing anything but light duties.  Total 32/50= 64%   Interpretation of scores: Score Category Description  0-20% Minimal Disability The patient can cope with most living activities. Usually no treatment is indicated apart from advice on lifting, sitting and exercise  21-40% Moderate Disability The patient experiences more pain and difficulty with sitting, lifting and standing. Travel and social life are more difficult and they may be disabled from work. Personal care, sexual activity and sleeping are not grossly affected, and the patient can usually be managed by conservative means  41-60% Severe Disability Pain remains the main problem in this group, but activities of daily living are affected. These patients require a detailed investigation  61-80% Crippled Back pain impinges on all aspects of the patient's life. Positive intervention is required  81-100% Bed-bound  These patients are either bed-bound or exaggerating their symptoms  Bluford FORBES Zoe DELENA Karon DELENA, et al. Surgery versus conservative management of stable thoracolumbar fracture: the PRESTO feasibility RCT. [place]): [employer]; 2021 Nov. [facility] Technology Assessment, No. 25.62.) Appendix 3, Oswestry Disability Index category descriptors. Available from: Findjewelers.cz  Minimally Clinically Important Difference (MCID) = 12.8%       GOALS: Goals reviewed with patient? Yes  SHORT TERM GOALS: Target date: 09/25/2023  Pt will be independent with HEP for improved strength, pain, transfers. Baseline: none; 10/01/23: compliant Goal status: MET  2.  Pt will improve 5x sit<>stand to less than or equal to 30 sec to demonstrate improved functional strength and  transfer efficiency. Baseline: 37.25 sec; 24.95 sec with B UE support 10/06/23 Goal status: MET 10/06/23  LONG TERM GOALS: Target date: 10/23/2023>UPDATED TARGET 12/04/2023>UPDATED TARGET 01/08/2024  Pt will be independent with progression of HEP for improved pain, strength, transfers. Baseline:  Goal status: MET 11/2023  2.  Pt will improve Modified Oswestry Score to less than or equal to 50%, to demo less pain interference in daily activities. Baseline: 64%>32% Goal status: MET 12/01/2023  3.  Pt will improve 5x sit<>stand to less than or equal to 20 sec to demonstrate improved functional strength and transfer efficiency. Baseline: 26 sec 10/22/2023 (improved from 37 sec)> 23.78 sec 12/03/2023 Goal status: IN PROGRESS, 12/03/2023  4.  Pt will rate pain in low back, decreased by at least 50%, for improved ADLs and gait activities. Baseline: much less  pain, 10/22/2023 Goal status: MET, 10/22/2023  5. Patient to report tolerance for 30 minutes of standing/walking without device to return to PLOF.  Baseline: reports tolerating 30 min with RW; increased forward lean without device or with cane; fatigue reported after 19 min gait with cane/no device Goal status: PARTIALLY MET, 12/01/2023  6.. Patient to demonstrate gait speed of at least 2.3 ft/sec in order to improve access to community.  Baseline: 1.9 ft/sec with RW; 2.09 ft/sec with RW 10/22/2023>2.21 ft/sec with cane 12/03/23 Goal status: IN PROGRESS 12/03/2023  7.  Pt will improve DGI score to at least 20/24 to decrease fall risk.  Baseline:  16/24  Goal status:  INITIAL  8.  Pt will ambulate at least 920 ft in 6 MWT for improved gait efficiency and endurance for community gait.  Baseline:  836 ft with increased forward posture, slowed pace last minute  Goal status:  INITIAL    ASSESSMENT:  CLINICAL IMPRESSION: Pt presents today pleased with her progress with exercises and overall improved walking distances. Skilled PT session  focused core stabilization, BLE functional strength, and dynamic balance.  Primarily able to perform activities in session without cane, with very minor LOB, able to recover independently.  She is progressing well and she will continue to benefit from skilled PT towards goals for improved functional mobility and decreased fall risk.   OBJECTIVE IMPAIRMENTS: Abnormal gait, decreased balance, decreased mobility, difficulty walking, decreased strength, impaired flexibility, postural dysfunction, and pain.   ACTIVITY LIMITATIONS: sitting, standing, sleeping, transfers, bed mobility, and locomotion level  PARTICIPATION LIMITATIONS: community activity  PERSONAL FACTORS: 3+ comorbidities: See PMH above are also affecting patient's functional outcome.   REHAB POTENTIAL: Good  CLINICAL DECISION MAKING: Evolving/moderate complexity  EVALUATION COMPLEXITY: Moderate  PLAN:  PT FREQUENCY: 1-2x/week  PT DURATION: 6 weeks  PLANNED INTERVENTIONS: [id number]- PT Re-evaluation, 97750- Physical Performance Testing, 97110-Therapeutic exercises, 97530- Therapeutic activity, [id number]- Neuromuscular re-education, 97535- Self Care, [id number]- Manual therapy, [id number]- Gait training, [phone]- Canalith repositioning, [id number]- Aquatic Therapy, [phone]- Electrical stimulation (manual), [phone] (1-2 muscles), [id number] (3+ muscles)- Dry Needling, Patient/Family education, Balance training, Stair training, Taping, Joint mobilization, Spinal mobilization, Vestibular training, DME instructions, Cryotherapy, and Moist heat  PLAN FOR NEXT SESSION: Try again wall squats with therapy ball.  Progress postural strengthening; progress leg strength and balance.  Progress for abdominal and lumbar strengthening (try step taps, step ups with holding weighted ball).  Gait and balance activities without cane, weighted carry task.    Greig Anon, PT 12/17/23 10:59 AM Phone: [phone] Fax: [phone]  [facility] at [facility] [address] [place], [address] [place], [place] [address] Phone # [phone] Fax # [phone]

## 2023-12-18 ENCOUNTER — Telehealth: Payer: Self-pay | Admitting: Gastroenterology

## 2023-12-18 NOTE — Telephone Encounter (Signed)
 Inbound call from patient stating she would like to speak to nurse in regards to Sibo Test and would like to know if we have received results for the test. Patient also stated she's unavailable from 11:45am-2:30pm Requesting a call back Please advise  Thank you

## 2023-12-18 NOTE — Telephone Encounter (Signed)
 Heather have you seen the SIBO test results ?

## 2023-12-18 NOTE — Telephone Encounter (Signed)
 Results placed on [place], [place] desk for review. Patient aware it will be reviewed next week.

## 2023-12-18 NOTE — Telephone Encounter (Signed)
 Spoke with [facility] and they refax results to our office.

## 2023-12-21 ENCOUNTER — Ambulatory Visit: Admitting: Physical Therapy

## 2023-12-22 ENCOUNTER — Other Ambulatory Visit (HOSPITAL_COMMUNITY)
Admission: RE | Admit: 2023-12-22 | Discharge: 2023-12-22 | Disposition: A | Discharge status: Home / Self Care | Source: Ambulatory Visit | Attending: Obstetrics and Gynecology | Admitting: Obstetrics and Gynecology

## 2023-12-22 ENCOUNTER — Ambulatory Visit: Admitting: Obstetrics and Gynecology

## 2023-12-22 ENCOUNTER — Encounter: Payer: Self-pay | Admitting: Obstetrics and Gynecology

## 2023-12-22 VITALS — BP 124/64 | HR 72

## 2023-12-22 DIAGNOSIS — N812 Incomplete uterovaginal prolapse: Secondary | ICD-10-CM | POA: Diagnosis not present

## 2023-12-22 DIAGNOSIS — Z4689 Encounter for fitting and adjustment of other specified devices: Secondary | ICD-10-CM

## 2023-12-22 DIAGNOSIS — Z01419 Encounter for gynecological examination (general) (routine) without abnormal findings: Secondary | ICD-10-CM

## 2023-12-22 DIAGNOSIS — R87610 Atypical squamous cells of undetermined significance on cytologic smear of cervix (ASC-US): Secondary | ICD-10-CM | POA: Diagnosis not present

## 2023-12-22 DIAGNOSIS — Z124 Encounter for screening for malignant neoplasm of cervix: Secondary | ICD-10-CM

## 2023-12-22 DIAGNOSIS — Z9189 Other specified personal risk factors, not elsewhere classified: Secondary | ICD-10-CM | POA: Diagnosis not present

## 2023-12-22 DIAGNOSIS — N879 Dysplasia of cervix uteri, unspecified: Secondary | ICD-10-CM

## 2023-12-22 DIAGNOSIS — N9089 Other specified noninflammatory disorders of vulva and perineum: Secondary | ICD-10-CM

## 2023-12-22 DIAGNOSIS — Z5181 Encounter for therapeutic drug level monitoring: Secondary | ICD-10-CM

## 2023-12-22 MED ORDER — RIFAXIMIN 550 MG PO TABS: 550.0000 mg | ORAL_TABLET | Freq: Three times a day (TID) | ORAL | 0 refills | Status: DC

## 2023-12-22 MED ORDER — ESTRADIOL 0.01 % VA CREA: TOPICAL_CREAM | VAGINAL | 2 refills | Status: AC

## 2023-12-22 NOTE — Progress Notes (Signed)
 GYNECOLOGY  VISIT   HPI: 78 y.o.   Married  Caucasian female   G2P0002 with No LMP recorded. Patient is postmenopausal.   here for: breast and pelvic exam, Pap & pessary check      She has incomplete uterovaginal prolapse and uses a #3 ring with support pessary.  Has some red/brown discharge.   Not using vaginal estrogen cream due to difficulty in placing.  She has done pelvic floor therapy.   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  Her colposcopy showed atypia ob 12/24/22.    Doing evaluation for abdominal bloating.  She had a normal pelvic ultrasound 12/12/21.  Has a bone density scheduled for tomorrow at [facility].    PCP ordering.    Uses a cane and a walker.   GYNECOLOGIC HISTORY: No LMP recorded. Patient is postmenopausal. Contraception:  PMP, tubal Menopausal hormone therapy:  Estrace  cream. Last 2 paps:  10/09/22 ASCUS, 11/11/21 ASCUS History of abnormal Pap or positive HPV:  yes Mammogram:  11/13/22 Breast Density Cat C, BIRADS Cat 1 neg         OB History     Gravida  2   Para      Term      Preterm      AB  0   Living  2      SAB  0   IAB      Ectopic  0   Multiple      Live Births                 Patient Active Problem List   Diagnosis Date Noted   Incontinence of feces with fecal urgency 05/05/2023   Gastroesophageal reflux disease with esophagitis without hemorrhage 05/05/2023   Bloating 05/05/2023   Other specified abnormal immunological findings in serum 04/03/2023   Elevated erythrocyte sedimentation rate 04/03/2023   High coronary artery calcium  score: 1525; 96th percentile 01/29/2023   SVT (supraventricular tachycardia) 12/31/2022   Decreased exercise tolerance 12/31/2022   Polymyalgia rheumatica 11/25/2022   Other secondary scoliosis, lumbar region 11/17/2022   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   Other female genital prolapse 10/29/2021   Long-term use of Plaquenil  04/02/2021   Primary open angle glaucoma (POAG)  of both eyes, severe stage 04/02/2021   Primary hypertension 01/11/2021   Hyperlipidemia LDL goal <70 01/11/2021   Acquired thrombophilia 12/11/2020   BMI less than 19,adult 12/11/2020   Hypothyroidism 12/11/2020   History of CVA (cerebrovascular accident) 05/02/2020   Allergic rhinitis    CAD (coronary artery disease) 05/20/2018    Past Medical History:  Diagnosis Date   Cataract 03/2019   Glaucoma Approximately 2005   [place] retinal tear of left eye 04/02/2021   Hyperlipidemia LDL goal <70 01/11/2021   Hypertension 2022   Long-term use of Plaquenil  04/02/2021   Lupus    new rheum questioning original dx. continuing Plaquenil  at lower dose for now.   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 bladder, acquired    Pseudophakia of both eyes 04/02/2021   Retinal detachment with multiple breaks, right eye 04/02/2021   Stroke (HCC) 04/2020   Thyroid  disease    Tibial plateau fracture, right 2025    Past Surgical History:  Procedure Laterality Date   ENDARTERECTOMY Right 05/02/2020   Procedure: RIGHT CAROTID ENDARTERECTOMY;  Surgeon: Oris Krystal FALCON, MD;  Location: [facility] OR;  Service: Vascular;  Laterality: Right;   EYE SURGERY  01/2020   LEFT HEART CATH AND CORONARY ANGIOGRAPHY N/A 02/03/2023   Procedure: LEFT HEART CATH AND CORONARY ANGIOGRAPHY;  Surgeon: Verlin Lonni BIRCH, MD;  Location: [facility] INVASIVE CV LAB;  Service: Cardiovascular;  Laterality: N/A;   SCLERAL BUCKLE Right 2004   TUBAL LIGATION  Feb 1982    Current Outpatient Medications  Medication Sig Dispense Refill   ARMOUR THYROID  30 MG tablet Take 1 tablet (30 mg total) by mouth daily. 90 tablet 4   aspirin  EC 81 MG tablet Take 1 tablet (81 mg total) by mouth daily. Swallow whole. 30 tablet 11   cetirizine (ZYRTEC) 10 MG tablet Take 10 mg by mouth See admin instructions. Every 36 hours     estradiol  (ESTRACE ) 0.1 MG/GM vaginal cream Use 1/2 gram three times per week at night.  42.5 g 0   furosemide  (LASIX ) 40 MG tablet Take 40 mg by mouth daily as needed for fluid or edema.     hydrochlorothiazide  (HYDRODIURIL ) 25 MG tablet Take 1 tablet (25 mg total) by mouth daily. 90 tablet 3   hydroxychloroquine  (PLAQUENIL ) 200 MG tablet Take 200 mg by mouth every other day.     latanoprost (XALATAN) 0.005 % ophthalmic solution Place 1 drop into the left eye at bedtime.     Misc Natural Products (AIRBORNE ELDERBERRY) CHEW Chew 1 tablet by mouth in the morning and at bedtime.     pantoprazole  (PROTONIX ) 40 MG tablet Take 1 tablet (40 mg total) by mouth daily. 90 tablet 3   Resveratrol 50 MG CAPS Take 50 mg by mouth daily.     rosuvastatin  (CRESTOR ) 40 MG tablet Take 1 tablet (40 mg total) by mouth daily. 90 tablet 3   SIMBRINZA 1-0.2 % SUSP Place 1 drop into the left eye 3 (three) times daily.     Sodium Chloride -Xylitol (XLEAR SINUS CARE SPRAY NA) Place 4 sprays into the nose daily as needed (Moisturing).     Ubiquinol 100 MG CAPS Take 100 mg by mouth.     valsartan  (DIOVAN ) 160 MG tablet Take 1 tablet (160 mg total) by mouth 2 (two) times daily. 180 tablet 1   verapamil  (CALAN -SR) 120 MG CR tablet Take 1 tablet (120 mg total) by mouth at bedtime. 90 tablet 1   No current facility-administered medications for this visit.     ALLERGIES: Amlodipine , Augmentin [amoxicillin-pot clavulanate], Tramadol, and Flexeril [cyclobenzaprine]  Family History  Problem Relation Age of Onset   ALS Mother    Early death Mother    Dementia Father    Hypertension Father    Macular degeneration Maternal Aunt    Alcohol abuse Paternal Uncle    Alcohol abuse Paternal Uncle     Social History   Socioeconomic History   Marital status: Married    Spouse name: Ubaldo   Number of children: 2   Years of education: Not on file   Highest education level: Some college, no degree  Occupational History   Occupation: retired   Occupation: retired  Tobacco Use   Smoking status: Never    Passive  exposure: Never   Smokeless tobacco: Never  Vaping Use   Vaping status: Never Used  Substance and Sexual Activity   Alcohol use: Never   Drug use: Never   Sexual activity: Not Currently    Birth control/protection: Surgical, Post-menopausal    Comment: tubal  Other Topics Concern   Not on file  Social History Narrative   Marital status/children/pets: Married.  2 children.  Education/employment: Retired.  College-educated.   Safety:      -smoke alarm in the home:Yes     - wears seatbelt: Yes     - Feels safe in their relationships: Yes      Social Drivers of Corporate Investment Banker Strain: Low Risk  (11/07/2023)   Overall Financial Resource Strain (CARDIA)    Difficulty of Paying Living Expenses: Not hard at all  Food Insecurity: No Food Insecurity (11/07/2023)   Hunger Vital Sign    Worried About Running Out of Food in the Last Year: Never true    Ran Out of Food in the Last Year: Never true  Transportation Needs: No Transportation Needs (11/07/2023)   PRAPARE - Administrator, Civil Service (Medical): No    Lack of Transportation (Non-Medical): No  Physical Activity: Unknown (06/24/2023)   Exercise Vital Sign    Days of Exercise per Week: 0 days    Minutes of Exercise per Session: Patient declined  Stress: No Stress Concern Present (06/24/2023)   [employer] of Occupational Health - Occupational Stress Questionnaire    Feeling of Stress : Only a little  Social Connections: Unknown (11/07/2023)   Social Connection and Isolation Panel    Frequency of Communication with Friends and Family: Not on file    Frequency of Social Gatherings with Friends and Family: Not on file    Attends Religious Services: Not on file    Active Member of Clubs or Organizations: Not on file    Attends Banker Meetings: Not on file    Marital Status: Married  Intimate Partner Violence: Not At Risk (06/24/2023)   Humiliation, Afraid, Rape, and Kick questionnaire     Fear of Current or Ex-Partner: No    Emotionally Abused: No    Physically Abused: No    Sexually Abused: No    Review of Systems  All other systems reviewed and are negative.   PHYSICAL EXAMINATION:   There were no vitals taken for this visit.    General appearance: alert, cooperative and appears stated age Head: Normocephalic, without obvious abnormality, atraumatic Neck: no adenopathy, supple, symmetrical, trachea midline and thyroid  normal to inspection and palpation Lungs: clear to auscultation bilaterally Breasts: normal appearance, no masses or tenderness, No nipple retraction or dimpling, No nipple discharge or bleeding, No axillary or supraclavicular adenopathy Heart: regular rate and rhythm Abdomen: soft, non-tender, no masses,  no organomegaly Extremities: extremities normal, atraumatic, no cyanosis or edema Skin: Skin color, texture, turgor normal. No rashes or lesions Lymph nodes: Cervical, supraclavicular, and axillary nodes normal. No abnormal inguinal nodes palpated Neurologic: Grossly normal  Pelvic: External genitalia: patches of erythema of the vulva. No raised lesions.                Urethra:  caruncle noted.  Evidence of bleeding.                Bartholins and Skenes: normal                 Vagina: normal appearing vagina with normal color and discharge, no lesions              Cervix: no lesions.  Pap collected.                 Bimanual Exam:  Uterus:  normal size, contour, position, consistency, mobility, non-tender              Adnexa: no mass, fullness, tenderness  Rectal exam: Yes.  .  Confirms.              Anus:  normal sphincter tone, no lesions Pessary removed, cleansed, and replaced.   Chaperone was present for exam:  Kari HERO, CMA  ASSESSMENT:  Encounter for breast and pelvic exam.  Cervical cancer screening.  Hx cervical atypia.  Incomplete uterovaginal prolapse. Pessary maintenance.  Vaginal atrophy. Vulvar lesions.  May be  due to atrophy and use of aspirin .  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    Encounter for medication monitoring.   PLAN:  Pap and HR HPV collected.  Continue use of vaginal estradiol  cream.  1/2 gm to vulva/urethra/vaginal at hs 3 times per week.  I discussed potential effect on breast cancer.  Update mammogram.   [facility]  Self breast exam reviewed.  Continue pessary use.  Follow up visit in about 4 weeks.  May need vulvar biopsy.     35 min  total time was spent for this patient encounter, including preparation, face-to-face counseling with the patient, coordination of care, and documentation of the encounter in addition to doing the breast and pelvic exam and pap.

## 2023-12-22 NOTE — Telephone Encounter (Signed)
 Patient informed of results. Script sent in for Xifaxan .

## 2023-12-22 NOTE — Telephone Encounter (Signed)
 PT is calling to let us  know that her insurance will not cover the Xifaxin and they would like to know what to do at this point. Please advise.

## 2023-12-23 ENCOUNTER — Other Ambulatory Visit (HOSPITAL_COMMUNITY): Payer: Self-pay

## 2023-12-23 ENCOUNTER — Telehealth: Payer: Self-pay

## 2023-12-23 ENCOUNTER — Ambulatory Visit (HOSPITAL_BASED_OUTPATIENT_CLINIC_OR_DEPARTMENT_OTHER)
Admission: RE | Admit: 2023-12-23 | Discharge: 2023-12-23 | Disposition: A | Discharge status: Home / Self Care | Source: Ambulatory Visit | Attending: Family Medicine | Admitting: Family Medicine

## 2023-12-23 DIAGNOSIS — M8589 Other specified disorders of bone density and structure, multiple sites: Secondary | ICD-10-CM | POA: Insufficient documentation

## 2023-12-23 DIAGNOSIS — M810 Age-related osteoporosis without current pathological fracture: Secondary | ICD-10-CM | POA: Diagnosis not present

## 2023-12-23 DIAGNOSIS — Z780 Asymptomatic menopausal state: Secondary | ICD-10-CM | POA: Diagnosis not present

## 2023-12-23 NOTE — Telephone Encounter (Signed)
 Noted pt. advised

## 2023-12-23 NOTE — Telephone Encounter (Signed)
 Pharmacy Patient Advocate Encounter   Received notification from Pt Calls Messages that prior authorization for Xifaxan  550MG  tablets is required/requested.   Insurance verification completed.   The patient is insured through [employer].   Per test claim: PA required; PA submitted to above mentioned insurance via Latent Key/confirmation #/[id number] Status is pending

## 2023-12-23 NOTE — Telephone Encounter (Signed)
 Pharmacy Patient Advocate Encounter  Received notification from [facility] [employer] that Prior Authorization for Xifaxan  550MG  tablets has been APPROVED from 12-23-2023 to 12-22-2024   PA #/Case ID/Reference #: [id number]

## 2023-12-23 NOTE — Telephone Encounter (Signed)
 Please submit for PA

## 2023-12-24 LAB — CYTOLOGY - PAP
Comment: NEGATIVE
Diagnosis: UNDETERMINED — AB
High risk HPV: NEGATIVE

## 2023-12-24 NOTE — Telephone Encounter (Signed)
 No further action needed.

## 2023-12-25 ENCOUNTER — Ambulatory Visit: Admitting: Gastroenterology

## 2023-12-29 ENCOUNTER — Ambulatory Visit: Payer: Self-pay | Admitting: Obstetrics and Gynecology

## 2023-12-30 ENCOUNTER — Encounter: Payer: Self-pay | Admitting: Physical Therapy

## 2023-12-30 ENCOUNTER — Ambulatory Visit: Admitting: Physical Therapy

## 2023-12-30 DIAGNOSIS — M6281 Muscle weakness (generalized): Secondary | ICD-10-CM

## 2023-12-30 DIAGNOSIS — R2681 Unsteadiness on feet: Secondary | ICD-10-CM

## 2023-12-30 DIAGNOSIS — R293 Abnormal posture: Secondary | ICD-10-CM

## 2023-12-30 NOTE — Therapy (Signed)
 OUTPATIENT PHYSICAL THERAPY NEURO TREATMENT NOTE  Patient Name: Wendy Jackson MRN: [id number] DOB:June 26, 1945, 78 y.o., female Today's Date: 12/30/2023   PCP: Catherine Charlies LABOR, DO REFERRING PROVIDER: Persons, Ronal Dragon, [place]  (Additional referral from Jerri Kay HERO, MD for [phone] (ICD-10-CM) - Acute pain of right knee M54.50 (ICD-10-CM) - Acute bilateral low back pain without sciatica)         END OF SESSION:  PT End of Session - 12/30/23 1302     Visit Number 19    Number of Visits 22    Date for Recertification  01/08/24    Authorization Type BCBS medicare    Progress Note Due on Visit 27    PT Start Time 1315    PT Stop Time 1358    PT Time Calculation (min) 43 min    Equipment Utilized During Treatment Gait belt    Activity Tolerance Patient tolerated treatment well    Behavior During Therapy WFL for tasks assessed/performed                       Past Medical History:  Diagnosis Date   Cataract 03/2019   Glaucoma Approximately 2005   Horseshoe retinal tear of left eye 04/02/2021   Hyperlipidemia LDL goal <70 01/11/2021   Hypertension 2022   Long-term use of Plaquenil  04/02/2021   Lupus    new rheum questioning original dx. continuing Plaquenil  at lower dose for now.   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 bladder, acquired    Pseudophakia of both eyes 04/02/2021   Retinal detachment with multiple breaks, right eye 04/02/2021   Stroke (HCC) 04/2020   Thyroid  disease    Tibial plateau fracture, right 2025   Past Surgical History:  Procedure Laterality Date   ENDARTERECTOMY Right 05/02/2020   Procedure: RIGHT CAROTID ENDARTERECTOMY;  Surgeon: Oris Krystal FALCON, MD;  Location[facility];  Service: Vascular;  Laterality: Right;   EYE SURGERY  01/2020   LEFT HEART CATH AND CORONARY ANGIOGRAPHY N/A 02/03/2023   Procedure: LEFT HEART CATH AND CORONARY ANGIOGRAPHY;  Surgeon: Verlin Lonni BIRCH, MD;  Location: [facility]  INVASIVE CV LAB;  Service: Cardiovascular;  Laterality: N/A;   SCLERAL BUCKLE Right 2004   TUBAL LIGATION  Feb 1982   Patient Active Problem List   Diagnosis Date Noted   Incontinence of feces with fecal urgency 05/05/2023   Gastroesophageal reflux disease with esophagitis without hemorrhage 05/05/2023   Bloating 05/05/2023   Other specified abnormal immunological findings in serum 04/03/2023   Elevated erythrocyte sedimentation rate 04/03/2023   High coronary artery calcium  score: 1525; 96th percentile 01/29/2023   SVT (supraventricular tachycardia) 12/31/2022   Decreased exercise tolerance 12/31/2022   Polymyalgia rheumatica 11/25/2022   Other secondary scoliosis, lumbar region 11/17/2022   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   Other female genital prolapse 10/29/2021   Long-term use of Plaquenil  04/02/2021   Primary open angle glaucoma (POAG) of both eyes, severe stage 04/02/2021   Primary hypertension 01/11/2021   Hyperlipidemia LDL goal <70 01/11/2021   Acquired thrombophilia 12/11/2020   BMI less than 19,adult 12/11/2020   Hypothyroidism 12/11/2020   History of CVA (cerebrovascular accident) 05/02/2020   Allergic rhinitis    CAD (coronary artery disease) 05/20/2018    ONSET DATE: 09/04/2023 (MD referral)  REFERRING DIAG: M54.50 (ICD-10-CM) - Acute bilateral low back pain without sciatica  New referral in patient's chart from Dr. Jerri stating Right tibial plateau fracture.  WBAT. Knee rehab. Strengthening. Gait training.   THERAPY DIAG:  Unsteadiness on feet  Muscle weakness (generalized)  Abnormal posture  Rationale for Evaluation and Treatment: Rehabilitation  SUBJECTIVE:                                                                                                                                                                                             SUBJECTIVE STATEMENT: Started antibiotics on SIBO test results.  Been doing more.  Doing the exercises, the bike  ([employer]), mostly using the cane (except for long distance to dining room and car).  Pt accompanied by: self and significant other  PERTINENT HISTORY: followed by Dr. Jerri for a lateral nondisplaced tibial plateau fracture.  She has been having to use a walker and bear minimal weight.  She does have a history of significant degenerative scoliosis and degenerative changes for which she had seen Dr. Barbarann in the past. Also hx of IBS, fecal leakage, has had pelvic floor therapy; polymalgia rheumatica   PAIN:  Are you having pain? No  PRECAUTIONS: Fall and Other: 10/01/23 pt reports she is now full weight bearing on R  RED FLAGS: None   WEIGHT BEARING RESTRICTIONS: None  FALLS: Has patient fallen in last 6 months? Yes. Number of falls 1; 3 falls in past 13 months (all have been tripping)  LIVING ENVIRONMENT: Lives with: lives with their spouse Lives in: House/apartment [place] Stairs: No Has following equipment at home: Single point cane, Walker - 2 wheeled, and single leg scooter (unable to use)  PLOF: Independent  PATIENT GOALS: To get back less tight and painful; 12/03/2023-to be able to walk again with cane or less.  OBJECTIVE:    TODAY'S TREATMENT: 12/30/2023 Activity Comments  Sit to stand, 2 x 5 reps with hands out from/at knees Sit to stand, 2 x 5 reps holding 2.2# ball Varying speeds  Added airex under bottom for ease of standing  Gait with weigthed carry:   50 ft x 8 reps 50 ft x 4 Several mild LOB, able to recover 4# single UE 4# BUE  Wall squats with red therapy ball behind back, 2 x 10 reps Good technique  Standing on Airex with upper body activities and core stability work: T-position V-position Gentle trunk rotation Holding 2.2# ball, mild instability, cues for core stability         Access Code: [email or web address] URL: [email or web address] Date: 12/30/2023 Prepared by: [facility] Out[facility]  Re[facility] - [facility]  Exercises - Lower Trunk  Rotations  - 1 x daily - 7 x weekly - 2 sets - 10 reps - Seated  Hamstring Stretch  - 1 x daily - 7 x weekly - 2 sets - 30 sec hold - Standing Scapular Retraction  - 1 x daily - 7 x weekly - 3 sets - 10 reps - Seated Long Arc Quad  - 1 x daily - 7 x weekly - 2 sets - 10 reps - Backward Walking with Counter Support  - 1 x daily - 7 x weekly - 1 sets - 3-5 reps - Forward Step Up  - 1 x daily - 7 x weekly - 3 sets - 5 reps - Supine Bridge  - 1 x daily - 7 x weekly - 2-3 sets - 10 reps - Side Stepping with Resistance at Ankles and Counter Support  - 1 x daily - 7 x weekly - 1 sets - 3 reps - Standing 3-Way Leg Reach with Resistance at Ankles and Counter Support  - 1 x daily - 7 x weekly - 3 sets - 10 reps - Tandem Walking with Counter Support  - 1 x daily - 7 x weekly - 1 sets - 5 reps - Mini Squat with Counter Support  - 1 x daily - 5 x weekly - 2 sets - 10 reps - Wall Quarter Squat with Swiss Ball  - 1 x daily - 3 x weekly - 3 sets - 10 reps - Kettlebell Suitcase Carry  - 1 x daily - 7 x weekly - 1 sets - 10 reps - Standing on Foam Pad  - 1 x daily - 3 x weekly - 5 reps      PATIENT EDUCATION: Education details: Consolidated/updated HEP and discussed POC/likely discharge next visit. Person educated: Patient and Spouse Education method: Explanation, Demonstration, Tactile cues, Verbal cues, and Handouts Education comprehension: verbalized understanding and returned demonstration     Note: Objective measures were completed at Evaluation unless otherwise noted.  DIAGNOSTIC FINDINGS: No acute findings per pt/husband report-curvature and severe arthritis  COGNITION: Overall cognitive status: Within functional limits for tasks assessed   SENSATION: Not tested  MUSCLE LENGTH: Hamstrings: Right NT deg; Left -30 deg   LOWER EXTREMITY MMT:     Active (in sitting) Right 9/16 Left 9/16  Hip flexion 4+ 4  Hip extension    Hip abduction 4 4  Hip adduction 4+ 4+  Hip internal  rotation    Hip external rotation    Knee flexion 4+ 4+  Knee extension 4+ 5  Ankle dorsiflexion 4 4  Ankle plantarflexion 4+ 4+  Ankle inversion    Ankle eversion     (Blank rows = not tested)  LOWER EXTREMITY ROM:    ROM Right 9/16 Left 9/16  Knee flexion 134 135  Knee extension 3 0  (Blank rows = not tested)  POSTURE: rounded shoulders, forward head, increased thoracic kyphosis, weight shift left, and L shoulder lower than R   PALPATION:  Tender along lumbar paraspinals bilaterally  BED MOBILITY:  Findings: Tends to go long sit<>supine with pain upon supine; instructed in log roll technique:  supine>L side>sit with min assist and cues  TRANSFERS: Sit to stand: Modified independence  Assistive device utilized: BUE support     Stand to sit: Modified independence  Assistive device utilized: BUE support      GAIT: Findings: Gait Characteristics: per MD note-minimal RLE weightbearing, step to pattern, step through pattern, and decreased stance time- Right, Distance walked: 20 ft, Assistive device utilized:Walker - 2 wheeled, Level of assistance: SBA, and Comments: antalgic pattern  FUNCTIONAL TESTS:  5 times sit to stand: 37.25 sec with BUE support  PATIENT SURVEYS:  Modified Oswestry:  MODIFIED OSWESTRY DISABILITY SCALE  Date: 09/10/2023 Score  Pain intensity 4 =  Pain medication provides me with little relief from pain.  2. Personal care (washing, dressing, etc.) 2 =  It is painful to take care of myself, and I am slow and careful.  3. Lifting 5 =  I cannot lift or carry anything at all.  4. Walking 4 = I can only walk with crutches or a cane.  5. Sitting 3 =  Pain prevents me from sitting more than  hour.  6. Standing 3 =  Pain prevents me from standing more than 1/2 hour.  7. Sleeping 1 = I can sleep well only by using pain medication.  8. Social Life 5 =  I have hardly any social life because of my pain.  9. Traveling 2 =  My pain restricts my travel over 2 hours.   10. Employment/ Homemaking 3 = Pain prevents me from doing anything but light duties.  Total 32/50= 64%   Interpretation of scores: Score Category Description  0-20% Minimal Disability The patient can cope with most living activities. Usually no treatment is indicated apart from advice on lifting, sitting and exercise  21-40% Moderate Disability The patient experiences more pain and difficulty with sitting, lifting and standing. Travel and social life are more difficult and they may be disabled from work. Personal care, sexual activity and sleeping are not grossly affected, and the patient can usually be managed by conservative means  41-60% Severe Disability Pain remains the main problem in this group, but activities of daily living are affected. These patients require a detailed investigation  61-80% Crippled Back pain impinges on all aspects of the patients life. Positive intervention is required  81-100% Bed-bound  These patients are either bed-bound or exaggerating their symptoms  Bluford FORBES Zoe DELENA Karon DELENA, et al. Surgery versus conservative management of stable thoracolumbar fracture: the PRESTO feasibility RCT. [place] ([place]): [employer]; 2021 Nov. [facility] Technology Assessment, No. 25.62.) Appendix 3, Oswestry Disability Index category descriptors. Available from: Findjewelers.cz  Minimally Clinically Important Difference (MCID) = 12.8%       GOALS: Goals reviewed with patient? Yes  SHORT TERM GOALS: Target date: 09/25/2023  Pt will be independent with HEP for improved strength, pain, transfers. Baseline: none; 10/01/23: compliant Goal status: MET  2.  Pt will improve 5x sit<>stand to less than or equal to 30 sec to demonstrate improved functional strength and transfer efficiency. Baseline: 37.25 sec; 24.95 sec with B UE support 10/06/23 Goal status: MET 10/06/23  LONG TERM GOALS: Target date: 10/23/2023>UPDATED TARGET  12/04/2023>UPDATED TARGET 01/08/2024  Pt will be independent with progression of HEP for improved pain, strength, transfers. Baseline:  Goal status: MET 11/2023  2.  Pt will improve Modified Oswestry Score to less than or equal to 50%, to demo less pain interference in daily activities. Baseline: 64%>32% Goal status: MET 12/01/2023  3.  Pt will improve 5x sit<>stand to less than or equal to 20 sec to demonstrate improved functional strength and transfer efficiency. Baseline: 26 sec 10/22/2023 (improved from 37 sec)> 23.78 sec 12/03/2023 Goal status: IN PROGRESS, 12/03/2023  4.  Pt will rate pain in low back, decreased by at least 50%, for improved ADLs and gait activities. Baseline: much less pain, 10/22/2023 Goal status: MET, 10/22/2023  5. Patient to report tolerance for 30 minutes of standing/walking without device to return to  PLOF.  Baseline: reports tolerating 30 min with RW; increased forward lean without device or with cane; fatigue reported after 19 min gait with cane/no device Goal status: PARTIALLY MET, 12/01/2023  6.. Patient to demonstrate gait speed of at least 2.3 ft/sec in order to improve access to community.  Baseline: 1.9 ft/sec with RW; 2.09 ft/sec with RW 10/22/2023>2.21 ft/sec with cane 12/03/23 Goal status: IN PROGRESS 12/03/2023  7.  Pt will improve DGI score to at least 20/24 to decrease fall risk.  Baseline:  16/24  Goal status:  INITIAL  8.  Pt will ambulate at least 920 ft in 6 MWT for improved gait efficiency and endurance for community gait.  Baseline:  836 ft with increased forward posture, slowed pace last minute  Goal status:  INITIAL    ASSESSMENT:  CLINICAL IMPRESSION: Pt presents today to work on progression of standing and gait activities.  Able to perform full session of activity without cane, and worked on compound movements incorporating functional positions, compliant surfaces, and gait with cues for abdominal/core activation.  She is  pleased with these exercises; consolidated HEP to reflect progression to these exercises.  She is moving well and progressing towards goals; anticipate plans for discharge next session.     OBJECTIVE IMPAIRMENTS: Abnormal gait, decreased balance, decreased mobility, difficulty walking, decreased strength, impaired flexibility, postural dysfunction, and pain.   ACTIVITY LIMITATIONS: sitting, standing, sleeping, transfers, bed mobility, and locomotion level  PARTICIPATION LIMITATIONS: community activity  PERSONAL FACTORS: 3+ comorbidities: See PMH above are also affecting patient's functional outcome.   REHAB POTENTIAL: Good  CLINICAL DECISION MAKING: Evolving/moderate complexity  EVALUATION COMPLEXITY: Moderate  PLAN:  PT FREQUENCY: 1-2x/week  PT DURATION: 6 weeks  PLANNED INTERVENTIONS: 97164- PT Re-evaluation, 97750- Physical Performance Testing, 97110-Therapeutic exercises, 97530- Therapeutic activity, [id number]- Neuromuscular re-education, 97535- Self Care, 02859- Manual therapy, [id number]- Gait training, [phone]- Canalith repositioning, [id number]- Aquatic Therapy, [phone]- Electrical stimulation (manual), [phone] (1-2 muscles), 20561 (3+ muscles)- Dry Needling, Patient/Family education, Balance training, Stair training, Taping, Joint mobilization, Spinal mobilization, Vestibular training, DME instructions, Cryotherapy, and Moist heat  PLAN FOR NEXT SESSION: Review updates to HEP; check LTGs and discuss discharge.    Greig Anon, PT 12/30/2023 3:40 PM Phone: [phone] Fax: [phone]  [facility] at [facility] [address] [place], [address] [place], [place] [address] Phone # [phone] Fax # [phone]

## 2023-12-31 ENCOUNTER — Encounter: Payer: Self-pay | Admitting: Physician Assistant

## 2023-12-31 ENCOUNTER — Ambulatory Visit: Admitting: Physician Assistant

## 2023-12-31 VITALS — Ht 61.0 in | Wt 87.8 lb

## 2023-12-31 DIAGNOSIS — M810 Age-related osteoporosis without current pathological fracture: Secondary | ICD-10-CM | POA: Insufficient documentation

## 2023-12-31 NOTE — Progress Notes (Signed)
 Office Visit Note   Patient: Wendy Jackson           Date of Birth: Jun 23, 1945           MRN: [id number] Visit Date: 12/31/2023              Requested by: Catherine Charlies LABOR, DO [address] [place],  [place] [address] PCP: Catherine Charlies LABOR, DO   Assessment & Plan: Visit Diagnoses:  1. Age-related osteoporosis without current pathological fracture     Plan: Patient is a pleasant 78 year old woman who is referred by Dr. Catherine.  She currently is not taking any medications for osteoporosis.  She has taken medication in the past as well as a supplement.  She does have a history of a spine fracture.  She has a history of heart disease and stroke though not in the last year.  She has no history of cancers.  No history of kidney disease gastric ulcers or gastric bypass or reflux.  No history of seizures.  She underwent menopause before she was 51 did do hormone replacement therapy.  She is not sure how much calcium  she gets in her diet we discussed tracking this and I have given her the information including food list and how and where she should be as far as her calcium  intake she was taking vitamin D  but was taking it at a significantly high dose and Dr. Catherine told her to stop taking it or take it less frequently which is what she is doing now most recent vitamin D  was in the 60s.  She has never been a smoker or drinker.  She does normally walk 6-7 times a week but obviously because of her recent injury has not been able to take do much exercise.  We talked about the importance of weight bearing exercises twice a week they do live at a senior residence that does have access to a gym we talked about maybe having her work with a trainer there just to get her started.  She does have a history of hip or spine fracture in her father she is at extremely high risk of fracture as her last bone density scan was -3.5.  A FRAX score on her demonstrates a 34% chance of a fracture in the next 10 years and a 21% chance of  a hip fracture in the next 10 years she is is extremely high risk.  Based on this I think she should begin with an anabolic medication.  Unfortunately she cannot take Evenity because of her cardiac history.  We talked about Tymlos as well as its side effects and administration.  Her and her husband agree they like to see if this was possible for them to get this we talked about that they would have to come in in a few months to get lab work.  They have been given my information if they need to contact me with any questions  Follow-Up Instructions:  Will contact patient pending insurance authorization Orders:  No orders of the defined types were placed in this encounter.  No orders of the defined types were placed in this encounter.     Procedures: No procedures performed   Clinical Data: No additional findings.   Subjective: No chief complaint on file.   HPI patient is a pleasant 78 year old woman who is referred for evaluation and treatment of osteoporosis by Dr. Catherine  Review of Systems  All other systems reviewed and are negative.  Objective: Vital Signs: Ht 5' 1 (1.549 m)   Wt 87 lb 12.8 oz (39.8 kg)   BMI 16.59 kg/m   Physical Exam Constitutional:      Appearance: Normal appearance.  Pulmonary:     Effort: Pulmonary effort is normal.  Skin:    General: Skin is warm and dry.  Neurological:     General: No focal deficit present.     Mental Status: She is alert and oriented to person, place, and time.  Psychiatric:        Mood and Affect: Mood normal.        Behavior: Behavior normal.     Ortho Exam  Specialty Comments:  CLINICAL DATA:  Low back pain   EXAM: LUMBAR SPINE - 2-3 VIEW   COMPARISON:  CT abdomen pelvis 02/21/2022   FINDINGS: Leftward curvature of the lumbar spine. Chronic height loss of the L1 vertebral body most pronounced at the inferior endplate. Multilevel degenerative disc disease throughout the lower thoracic and lumbar spine.  Grade 1 anterolisthesis L4 on L5. Multilevel lower lumbar spine facet degenerative changes. Age-indeterminate greater than 50% height loss of the T11 vertebral body.   IMPRESSION: 1. Age-indeterminate greater than 50% height loss of the T11 vertebral body. Correlate for point tenderness. 2. Chronic height loss of the L1 vertebral body. 3. Multilevel degenerative disc and facet disease.     Electronically Signed   By: Bard Moats M.D.   On: 09/16/2023 21:39  Imaging: No results found.   PMFS History: Patient Active Problem List   Diagnosis Date Noted   Age-related osteoporosis without current pathological fracture 12/31/2023   Incontinence of feces with fecal urgency 05/05/2023   Gastroesophageal reflux disease with esophagitis without hemorrhage 05/05/2023   Bloating 05/05/2023   Other specified abnormal immunological findings in serum 04/03/2023   Elevated erythrocyte sedimentation rate 04/03/2023   High coronary artery calcium  score: 1525; 96th percentile 01/29/2023   SVT (supraventricular tachycardia) 12/31/2022   Decreased exercise tolerance 12/31/2022   Polymyalgia rheumatica 11/25/2022   Other secondary scoliosis, lumbar region 11/17/2022   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   Other female genital prolapse 10/29/2021   Long-term use of Plaquenil  04/02/2021   Primary open angle glaucoma (POAG) of both eyes, severe stage 04/02/2021   Primary hypertension 01/11/2021   Hyperlipidemia LDL goal <70 01/11/2021   Acquired thrombophilia 12/11/2020   BMI less than 19,adult 12/11/2020   Hypothyroidism 12/11/2020   History of CVA (cerebrovascular accident) 05/02/2020   Allergic rhinitis    CAD (coronary artery disease) 05/20/2018   Past Medical History:  Diagnosis Date   Age-related 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 continuing Plaquenil  at lower dose for now.   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 bladder, acquired    Pseudophakia of both eyes 04/02/2021   Retinal detachment with multiple breaks, right eye 04/02/2021   Stroke (HCC) 04/2020   Thyroid  disease    Tibial plateau fracture, right 2025    Family History  Problem Relation Age of Onset   ALS Mother    Early death Mother    Dementia Father    Hypertension Father    Macular degeneration Maternal Aunt  Alcohol abuse Paternal Uncle    Alcohol abuse Paternal Uncle     Past Surgical History:  Procedure Laterality Date   ENDARTERECTOMY Right 05/02/2020   Procedure: RIGHT CAROTID ENDARTERECTOMY;  Surgeon: Oris Krystal FALCON, MD;  Location: [facility];  Service: Vascular;  Laterality: Right;   EYE SURGERY  01/2020   LEFT HEART CATH AND CORONARY ANGIOGRAPHY N/A 02/03/2023   Procedure: LEFT HEART CATH AND CORONARY ANGIOGRAPHY;  Surgeon: Verlin Lonni BIRCH, MD;  Location: [facility] INVASIVE CV LAB;  Service: Cardiovascular;  Laterality: N/A;   SCLERAL BUCKLE Right 2004   TUBAL LIGATION  Feb 1982   Social History   Occupational History   Occupation: retired   Occupation: retired  Tobacco Use   Smoking status: Never    Passive exposure: Never   Smokeless tobacco: Never  Vaping Use   Vaping status: Never Used  Substance and Sexual Activity   Alcohol use: Never   Drug use: Never   Sexual activity: Not Currently    Birth control/protection: Surgical, Post-menopausal    Comment: tubal

## 2024-01-04 ENCOUNTER — Telehealth: Payer: Self-pay | Admitting: Gastroenterology

## 2024-01-04 NOTE — Telephone Encounter (Signed)
 Patient's husband called wishing to speak with nurse regarding rifaximin  medication refill. States medication has not been helping and patient is still experiencing symptoms. Requesting to speak further. Please advise, thank you

## 2024-01-04 NOTE — Telephone Encounter (Signed)
 See MyChart message

## 2024-01-05 ENCOUNTER — Ambulatory Visit: Admitting: Physical Therapy

## 2024-01-05 ENCOUNTER — Other Ambulatory Visit: Payer: Self-pay

## 2024-01-05 DIAGNOSIS — R2681 Unsteadiness on feet: Secondary | ICD-10-CM | POA: Diagnosis not present

## 2024-01-05 DIAGNOSIS — M6281 Muscle weakness (generalized): Secondary | ICD-10-CM

## 2024-01-05 MED ORDER — RIFAXIMIN 550 MG PO TABS: 550.0000 mg | ORAL_TABLET | Freq: Three times a day (TID) | ORAL | 0 refills | Status: AC

## 2024-01-05 NOTE — Therapy (Signed)
 " OUTPATIENT PHYSICAL THERAPY NEURO TREATMENT NOTE/DISCHARGE SUMMARY  Patient Name: Wendy Jackson MRN: [id number] DOB:1945/09/21, 78 y.o., female Today's Date: 01/05/2024   PCP: Catherine Charlies LABOR, DO REFERRING PROVIDER: Persons, Ronal Dragon, [place]  (Additional referral from Jerri Kay HERO, MD for [id number] (ICD-10-CM) - Acute pain of right knee M54.50 (ICD-10-CM) - Acute bilateral low back pain without sciatica)     PHYSICAL THERAPY DISCHARGE SUMMARY  Visits from Start of Care: 20  Current functional level related to goals / functional outcomes: Pt has met 7 of 8 LTGs-see below   Remaining deficits: High level balance, posture, strength-improving   Education / Equipment: HEP progression, 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 Patient goals were met. Patient is being discharged due to being pleased with the current functional level.     END OF SESSION:  PT End of Session - 01/05/24 1320     Visit Number 20    Number of Visits 22    Date for Recertification  01/08/24    Authorization Type BCBS medicare    Progress Note Due on Visit 27    PT Start Time 1322    PT Stop Time 1400    PT Time Calculation (min) 38 min    Equipment Utilized During Treatment Gait belt    Activity Tolerance Patient tolerated treatment well    Behavior During Therapy WFL for tasks assessed/performed                       Past Medical History:  Diagnosis Date   Age-related osteoporosis without current pathological fracture 12/31/2023   Cataract 03/2019   Glaucoma Approximately 2005   H[place]hoe retinal tear of left eye 04/02/2021   Hyperlipidemia LDL goal <70 01/11/2021   Hypertension 2022   Long-term use of Plaquenil  04/02/2021   Lupus    new rheum questioning original dx. continuing Plaquenil  at lower dose for now.   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 bladder, acquired     Pseudophakia of both eyes 04/02/2021   Retinal detachment with multiple breaks, right eye 04/02/2021   Stroke (HCC) 04/2020   Thyroid  disease    Tibial plateau fracture, right 2025   Past Surgical History:  Procedure Laterality Date   ENDARTERECTOMY Right 05/02/2020   Procedure: RIGHT CAROTID ENDARTERECTOMY;  Surgeon: Oris Krystal FALCON, MD;  Location: [facility] OR;  Service: Vascular;  Laterality: Right;   EYE SURGERY  01/2020   LEFT HEART CATH AND CORONARY ANGIOGRAPHY N/A 02/03/2023   Procedure: LEFT HEART CATH AND CORONARY ANGIOGRAPHY;  Surgeon: Verlin Lonni BIRCH, MD;  Location: [facility] INVASIVE CV LAB;  Service: Cardiovascular;  Laterality: N/A;   SCLERAL BUCKLE Right 2004   TUBAL LIGATION  Feb 1982   Patient Active Problem List   Diagnosis Date Noted   Age-related osteoporosis without current pathological fracture 12/31/2023   Incontinence of feces with fecal urgency 05/05/2023   Gastroesophageal reflux disease with esophagitis without hemorrhage 05/05/2023   Bloating 05/05/2023   Other specified abnormal immunological findings in serum 04/03/2023   Elevated erythrocyte sedimentation rate 04/03/2023   High coronary artery calcium  score: 1525; 96th percentile 01/29/2023   SVT (supraventricular tachycardia) 12/31/2022   Decreased exercise tolerance 12/31/2022   Polymyalgia rheumatica 11/25/2022   Other secondary scoliosis, lumbar region 11/17/2022   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   Other female genital prolapse 10/29/2021   Long-term use of  Plaquenil  04/02/2021   Primary open angle glaucoma (POAG) of both eyes, severe stage 04/02/2021   Primary hypertension 01/11/2021   Hyperlipidemia LDL goal <70 01/11/2021   Acquired thrombophilia 12/11/2020   BMI less than 19,adult 12/11/2020   Hypothyroidism 12/11/2020   History of CVA (cerebrovascular accident) 05/02/2020   Allergic rhinitis    CAD (coronary artery disease) 05/20/2018    ONSET DATE: 09/04/2023 (MD referral)  REFERRING DIAG:  M54.50 (ICD-10-CM) - Acute bilateral low back pain without sciatica  New referral in patient's chart from Dr. Jerri stating Right tibial plateau fracture. WBAT. Knee rehab. Strengthening. Gait training.   THERAPY DIAG:  Unsteadiness on feet  Muscle weakness (generalized)  Rationale for Evaluation and Treatment: Rehabilitation  SUBJECTIVE:                                                                                                                                                                                             SUBJECTIVE STATEMENT: Did find out that I have osteoporosis.   Pt accompanied by: self and significant other  PERTINENT HISTORY: followed by Dr. Jerri for a lateral nondisplaced tibial plateau fracture.  She has been having to use a walker and bear minimal weight.  She does have a history of significant degenerative scoliosis and degenerative changes for which she had seen Dr. Barbarann in the past. Also hx of IBS, fecal leakage, has had pelvic floor therapy; polymalgia rheumatica   PAIN:  Are you having pain? No  PRECAUTIONS: Fall and Other: 10/01/23 pt reports she is now full weight bearing on R  RED FLAGS: None   WEIGHT BEARING RESTRICTIONS: None  FALLS: Has patient fallen in last 6 months? Yes. Number of falls 1; 3 falls in past 13 months (all have been tripping)  LIVING ENVIRONMENT: Lives with: lives with their spouse Lives in: House/apartment [facility] Stairs: No Has following equipment at home: Single point cane, Walker - 2 wheeled, and single leg scooter (unable to use)  PLOF: Independent  PATIENT GOALS: To get back less tight and painful; 12/03/2023-to be able to walk again with cane or less.  OBJECTIVE:    TODAY'S TREATMENT: 01/05/2024 Activity Comments  FTSTS 21.96 no UE support 20.16 with BUE support   DGI 19/24 Improved from 12/24  10 M walk:  2.75 ft/sec Imporved from 2.1 ft/sec  6 M walk test: 1000 ft Improved from 836 ft            Access Code: [email or web address] URL: [email or web address] Date: 12/30/2023 Prepared by: [facility] - Outpatient  Rehab - [facility]  Exercises - Lower Trunk Rotations  - 1 x daily -  7 x weekly - 2 sets - 10 reps - Seated Hamstring Stretch  - 1 x daily - 7 x weekly - 2 sets - 30 sec hold - Standing Scapular Retraction  - 1 x daily - 7 x weekly - 3 sets - 10 reps - Seated Long Arc Quad  - 1 x daily - 7 x weekly - 2 sets - 10 reps - Backward Walking with Counter Support  - 1 x daily - 7 x weekly - 1 sets - 3-5 reps - Forward Step Up  - 1 x daily - 7 x weekly - 3 sets - 5 reps - Supine Bridge  - 1 x daily - 7 x weekly - 2-3 sets - 10 reps - Side Stepping with Resistance at Ankles and Counter Support  - 1 x daily - 7 x weekly - 1 sets - 3 reps - Standing 3-Way Leg Reach with Resistance at Ankles and Counter Support  - 1 x daily - 7 x weekly - 3 sets - 10 reps - Tandem Walking with Counter Support  - 1 x daily - 7 x weekly - 1 sets - 5 reps - Mini Squat with Counter Support  - 1 x daily - 5 x weekly - 2 sets - 10 reps - Wall Quarter Squat with Swiss Ball  - 1 x daily - 3 x weekly - 3 sets - 10 reps - Kettlebell Suitcase Carry  - 1 x daily - 7 x weekly - 1 sets - 10 reps - Standing on Foam Pad  - 1 x daily - 3 x weekly - 5 reps      PATIENT EDUCATION: Education details: Progress towards goals, POC; answered pt/husband questions regarding new dx of osteoporosis as well as strength/balance training with staff at [facility]. Person educated: Patient and Spouse Education method: Explanation, Demonstration, Tactile cues, Verbal cues, and Handouts Education comprehension: verbalized understanding and returned demonstration     Note: Objective measures were completed at Evaluation unless otherwise noted.  DIAGNOSTIC FINDINGS: No acute findings per pt/husband report-curvature and severe arthritis  COGNITION: Overall cognitive status: Within functional limits for tasks  assessed   SENSATION: Not tested  MUSCLE LENGTH: Hamstrings: Right NT deg; Left -30 deg   LOWER EXTREMITY MMT:     Active (in sitting) Right 9/16 Left 9/16  Hip flexion 4+ 4  Hip extension    Hip abduction 4 4  Hip adduction 4+ 4+  Hip internal rotation    Hip external rotation    Knee flexion 4+ 4+  Knee extension 4+ 5  Ankle dorsiflexion 4 4  Ankle plantarflexion 4+ 4+  Ankle inversion    Ankle eversion     (Blank rows = not tested)  LOWER EXTREMITY ROM:    ROM Right 9/16 Left 9/16  Knee flexion 134 135  Knee extension 3 0  (Blank rows = not tested)  POSTURE: rounded shoulders, forward head, increased thoracic kyphosis, weight shift left, and L shoulder lower than R   PALPATION:  Tender along lumbar paraspinals bilaterally  BED MOBILITY:  Findings: Tends to go long sit<>supine with pain upon supine; instructed in log roll technique:  supine>L side>sit with min assist and cues  TRANSFERS: Sit to stand: Modified independence  Assistive device utilized: BUE support     Stand to sit: Modified independence  Assistive device utilized: BUE support      GAIT: Findings: Gait Characteristics: per MD note-minimal RLE weightbearing, step to pattern, step through pattern, and decreased  stance time- Right, Distance walked: 20 ft, Assistive device utilized:Walker - 2 wheeled, Level of assistance: SBA, and Comments: antalgic pattern  FUNCTIONAL TESTS:  5 times sit to stand: 37.25 sec with BUE support  PATIENT SURVEYS:  Modified Oswestry:  MODIFIED OSWESTRY DISABILITY SCALE  Date: 09/10/2023 Score  Pain intensity 4 =  Pain medication provides me with little relief from pain.  2. Personal care (washing, dressing, etc.) 2 =  It is painful to take care of myself, and I am slow and careful.  3. Lifting 5 =  I cannot lift or carry anything at all.  4. Walking 4 = I can only walk with crutches or a cane.  5. Sitting 3 =  Pain prevents me from sitting more than  hour.  6.  Standing 3 =  Pain prevents me from standing more than 1/2 hour.  7. Sleeping 1 = I can sleep well only by using pain medication.  8. Social Life 5 =  I have hardly any social life because of my pain.  9. Traveling 2 =  My pain restricts my travel over 2 hours.  10. Employment/ Homemaking 3 = Pain prevents me from doing anything but light duties.  Total 32/50= 64%   Interpretation of scores: Score Category Description  0-20% Minimal Disability The patient can cope with most living activities. Usually no treatment is indicated apart from advice on lifting, sitting and exercise  21-40% Moderate Disability The patient experiences more pain and difficulty with sitting, lifting and standing. Travel and social life are more difficult and they may be disabled from work. Personal care, sexual activity and sleeping are not grossly affected, and the patient can usually be managed by conservative means  41-60% Severe Disability Pain remains the main problem in this group, but activities of daily living are affected. These patients require a detailed investigation  61-80% Crippled Back pain impinges on all aspects of the patients life. Positive intervention is required  81-100% Bed-bound  These patients are either bed-bound or exaggerating their symptoms  Bluford FORBES Zoe DELENA Karon DELENA, et al. Surgery versus conservative management of stable thoracolumbar fracture: the PRESTO feasibility RCT. [place] ([place]): [employer]; 2021 Nov. [facility] Technology Assessment, No. 25.62.) Appendix 3, Oswestry Disability Index category descriptors. Available from: Findjewelers.cz  Minimally Clinically Important Difference (MCID) = 12.8%       GOALS: Goals reviewed with patient? Yes  SHORT TERM GOALS: Target date: 09/25/2023  Pt will be independent with HEP for improved strength, pain, transfers. Baseline: none; 10/01/23: compliant Goal status: MET  2.  Pt will improve 5x  sit<>stand to less than or equal to 30 sec to demonstrate improved functional strength and transfer efficiency. Baseline: 37.25 sec; 24.95 sec with B UE support 10/06/23 Goal status: MET 10/06/23  LONG TERM GOALS: Target date: 10/23/2023>UPDATED TARGET 12/04/2023>UPDATED TARGET 01/08/2024  Pt will be independent with progression of HEP for improved pain, strength, transfers. Baseline:  Goal status: MET 11/2023  2.  Pt will improve Modified Oswestry Score to less than or equal to 50%, to demo less pain interference in daily activities. Baseline: 64%>32% Goal status: MET 12/01/2023  3.  Pt will improve 5x sit<>stand to less than or equal to 20 sec to demonstrate improved functional strength and transfer efficiency. Baseline: 26 sec 10/22/2023 (improved from 37 sec)> 23.78 sec 12/03/2023>20.16 sec UE support Goal status: MET, 01/05/2024  4.  Pt will rate pain in low back, decreased by at least 50%, for improved ADLs and  gait activities. Baseline: much less pain, 10/22/2023 Goal status: MET, 10/22/2023  5. Patient to report tolerance for 30 minutes of standing/walking without device to return to PLOF.  Baseline: reports tolerating 30 min with RW; increased forward lean without device or with cane; fatigue reported after 19 min gait with cane/no device Goal status: PARTIALLY MET, 12/01/2023  6.. Patient to demonstrate gait speed of at least 2.3 ft/sec in order to improve access to community.  Baseline: 1.9 ft/sec with RW; 2.09 ft/sec with RW 10/22/2023>2.21 ft/sec with cane 12/03/23; 2.75 ft/sec 01/05/2024 Goal status: MET, 01/05/2024  7.  Pt will improve DGI score to at least 20/24 to decrease fall risk.  Baseline:  16/24>19/24 01/05/2024  Goal status:  PARTIALLY MET 01/05/2024  8.  Pt will ambulate at least 920 ft in 6 MWT for improved gait efficiency and endurance for community gait.  Baseline:  836 ft with increased forward posture, slowed pace last minute> 1000 ft 01/05/2024  Goal  status:  MET, 01/05/2024    ASSESSMENT:  CLINICAL IMPRESSION: Pt presents today with reports of recent osteoporosis diagnosis.  Discussed that many of her current exercises emphasize weightbearing through upper and lower body (though goal of these was to work on posture and strengthening), and that she may want to further explore osteoporosis-specific exercise programming or training at [facility].  Assessed LTGs, and pt has met 7 of 8 LTGs.  She has met LTG 3 for improved FTSTS score, LTG 6 for improved gait velocity score, and LTG 8 for improved 6 MWT.  She has improved DGI score to 19/24, just not quite to goal level.  She is no longer reporting back pain and she has mostly transitioned to gait with no device (household distances) or cane, with the exception of longer distance community gait with RW.  She has a comprehensive HEP, which she is doing consistently.  She is appropriate for discharge at this time.  OBJECTIVE IMPAIRMENTS: Abnormal gait, decreased balance, decreased mobility, difficulty walking, decreased strength, impaired flexibility, postural dysfunction, and pain.   ACTIVITY LIMITATIONS: sitting, standing, sleeping, transfers, bed mobility, and locomotion level  PARTICIPATION LIMITATIONS: community activity  PERSONAL FACTORS: 3+ comorbidities: See PMH above are also affecting patient's functional outcome.   REHAB POTENTIAL: Good  CLINICAL DECISION MAKING: Evolving/moderate complexity  EVALUATION COMPLEXITY: Moderate  PLAN:  PT FREQUENCY: 1-2x/week  PT DURATION: 6 weeks  PLANNED INTERVENTIONS: 97164- PT Re-evaluation, 97750- Physical Performance Testing, 97110-Therapeutic exercises, 97530- Therapeutic activity, [id number]- Neuromuscular re-education, 97535- Self Care, 02859- Manual therapy, [id number]- Gait training, [phone]- Canalith repositioning, [id number]- Aquatic Therapy, [phone]- Electrical stimulation (manual), [phone] (1-2 muscles), 20561 (3+ muscles)- Dry Needling, Patient/Family  education, Balance training, Stair training, Taping, Joint mobilization, Spinal mobilization, Vestibular training, DME instructions, Cryotherapy, and Moist heat  PLAN FOR NEXT SESSION: Discharge this visit.    Greig Anon, PT 01/05/2024 1:24 PM Phone: [phone] Fax: [phone]  [facility] at [facility] [address] [place], [address] [place], [place] [address] Phone # [phone] Fax # [phone]      "

## 2024-01-05 NOTE — Telephone Encounter (Signed)
 Prescription sent and pt to call and make f/u appt

## 2024-01-05 NOTE — Telephone Encounter (Signed)
 We can extend treatment to 21 days, please send Rx for additional 7 days 550mg  TID. Please schedule office visit next available for follow up in Jan

## 2024-01-10 ENCOUNTER — Encounter: Payer: Self-pay | Admitting: Family Medicine

## 2024-01-11 ENCOUNTER — Other Ambulatory Visit: Payer: Self-pay

## 2024-01-11 MED ORDER — VERAPAMIL HCL ER 120 MG PO TBCR: 120.0000 mg | EXTENDED_RELEASE_TABLET | Freq: Every day | ORAL | 0 refills | Status: DC

## 2024-01-13 ENCOUNTER — Telehealth: Payer: Self-pay

## 2024-01-13 MED ORDER — VERAPAMIL HCL ER 120 MG PO CP24: 120.0000 mg | ORAL_CAPSULE | Freq: Every day | ORAL | 1 refills | Status: AC

## 2024-01-13 NOTE — Telephone Encounter (Signed)
 Source  [facility] (Other)   Subject  [name] (Patient)   Topic  Clinical - Prescription Issue    Communication  Reason for CRM: [facility] called in because the patients Verapamil  (in tablet form) is on back order, wants it changed to capsules. Pharmacy requesting call back to confirm this is okay and can be reached at [phone].   Patient Information  Patient Name Gender DOB SSN  Wendy Jackson, Wendy Jackson Female 08-05-1945 [id number]   Contacts  Contact Date/Time Type Contact Phone/Fax  01/13/2024 11:01 AM EST Phone (Incoming) Designer, [employer] (Pharmacy) [phone]  Calling because the patients rx is on back order, wants it changed to capsules.    Please review and further advise.

## 2024-01-13 NOTE — Telephone Encounter (Signed)
 Called in a new prescription for capsule formulation of her verapamil  due to [facility] being out of stock

## 2024-01-13 NOTE — Addendum Note (Signed)
 Addended by: Alix Lahmann A on: 01/13/2024 11:56 AM   Modules accepted: Orders

## 2024-01-20 ENCOUNTER — Encounter: Payer: Self-pay | Admitting: Gastroenterology

## 2024-01-20 ENCOUNTER — Other Ambulatory Visit: Payer: Self-pay | Admitting: Radiology

## 2024-01-20 MED ORDER — TYMLOS 3120 MCG/1.56ML ~~LOC~~ SOPN: 80.0000 ug | PEN_INJECTOR | Freq: Every day | SUBCUTANEOUS | 12 refills | Status: AC

## 2024-01-22 ENCOUNTER — Ambulatory Visit (INDEPENDENT_AMBULATORY_CARE_PROVIDER_SITE_OTHER)
Admission: RE | Admit: 2024-01-22 | Discharge: 2024-01-22 | Disposition: A | Source: Ambulatory Visit | Attending: Gastroenterology | Admitting: Gastroenterology

## 2024-01-22 ENCOUNTER — Ambulatory Visit: Payer: Self-pay | Admitting: Gastroenterology

## 2024-01-22 ENCOUNTER — Telehealth: Payer: Self-pay | Admitting: Physician Assistant

## 2024-01-22 ENCOUNTER — Encounter: Payer: Self-pay | Admitting: Gastroenterology

## 2024-01-22 ENCOUNTER — Ambulatory Visit: Admitting: Gastroenterology

## 2024-01-22 VITALS — BP 130/54 | HR 56 | Ht 61.0 in | Wt 84.0 lb

## 2024-01-22 DIAGNOSIS — K5900 Constipation, unspecified: Secondary | ICD-10-CM

## 2024-01-22 DIAGNOSIS — R634 Abnormal weight loss: Secondary | ICD-10-CM

## 2024-01-22 DIAGNOSIS — R140 Abdominal distension (gaseous): Secondary | ICD-10-CM | POA: Diagnosis not present

## 2024-01-22 DIAGNOSIS — R6881 Early satiety: Secondary | ICD-10-CM | POA: Diagnosis not present

## 2024-01-22 DIAGNOSIS — K638219 Small intestinal bacterial overgrowth, unspecified: Secondary | ICD-10-CM | POA: Diagnosis not present

## 2024-01-22 MED ORDER — METRONIDAZOLE 250 MG PO TABS: 250.0000 mg | ORAL_TABLET | Freq: Three times a day (TID) | ORAL | 0 refills | Status: DC

## 2024-01-22 NOTE — Telephone Encounter (Signed)
 Wendy Jackson from [place] called wanting an ICD 9 code for Tymlos . Call back number is [phone]. They are available -Fri 8 :00 - 11:00 pm EST.

## 2024-01-22 NOTE — Progress Notes (Signed)
 "    01/22/2024 Wendy Jackson [id number] [birth year]   Discussed the use of AI scribe software for clinical note transcription with the patient, who gave verbal consent to proceed.  History of Present Illness Wendy Jackson is a 79 year old female with chronic abdominal bloating, early satiety, and unintentional weight loss who presents for follow-up of persistent gastrointestinal symptoms.  She is a patient of Dr. Trenna and is here today with her husband.  Persistent abdominal bloating and visible distention worsen throughout the day, with the abdomen more distended by evening but not completely flat in the morning. Dietary changes and current therapies have not relieved the distention.  Early satiety occurs with small amounts of food, leading to decreased appetite and difficulty increasing oral intake. This has been ongoing for at least two years back when she was evaluated with EGD, colonoscopy, and CT scan. Acid reflux, indigestion, nausea, and vomiting are denied.  Weight has fluctuated between 84 and 92 pounds over the past two years, with current weight at 84 pounds. Increasing weakness has led to the need for a walker for ambulation.  Bowel movements are regular and have improved with Benefiber and Ibguard. Diarrhea is denied, and constipation has not occurred recently. Bowel movements often occur shortly after eating. Alcohol use is denied.  A 21-day course of Xifaxan  in December 2025 for small intestinal bacterial overgrowth did not improve bloating or gastrointestinal symptoms. Had a positive SIBO breath test.  IBguard and Benefiber have helped with bowel regularity but not bloating.  Prior workup includes CT scan in February 2024 and endoscopy with colonoscopy in March 2024 for evaluation of weight loss and abdominal bloating. SIBO breath test was completed recently. Pelvic floor physical therapy was pursued for abdominal and core weakness, and physical therapy recently for a knee  injury.  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    Past Medical History:  Diagnosis Date   Age-related osteoporosis without current pathological fracture 12/31/2023   Cataract 03/2019   Glaucoma Approximately 2005   Horseshoe retinal tear of left eye 04/02/2021   Hyperlipidemia LDL goal <70 01/11/2021   Hypertension 2022   Long-term use of Plaquenil  04/02/2021   Lupus    new rheum questioning original dx. continuing Plaquenil  at lower dose for now.   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 bladder, acquired    Pseudophakia of both eyes 04/02/2021   Retinal detachment with multiple breaks, right eye 04/02/2021   Stroke (HCC) 04/2020   Thyroid  disease    Tibial plateau fracture, right 2025   Past Surgical History:  Procedure Laterality Date   ENDARTERECTOMY Right 05/02/2020   Procedure: RIGHT CAROTID ENDARTERECTOMY;  Surgeon: Oris Krystal FALCON, MD;  Location: [facility] OR;  Service: Vascular;  Laterality: Right;   EYE SURGERY  01/2020   LEFT HEART CATH AND CORONARY ANGIOGRAPHY N/A 02/03/2023   Procedure: LEFT HEART CATH AND CORONARY ANGIOGRAPHY;  Surgeon: Verlin Lonni BIRCH, MD;  Location: [facility] INVASIVE CV LAB;  Service: Cardiovascular;  Laterality: N/A;   SCLERAL BUCKLE Right 2004   TUBAL LIGATION  Feb 1982    reports that she has never smoked. She has never been exposed to tobacco smoke. She has never used smokeless tobacco. She reports that she does not drink  alcohol and does not use drugs. family history includes ALS in her mother; Alcohol abuse in her paternal uncle and paternal uncle; Dementia in her father; Early death in her mother; Hypertension in her  father; Macular degeneration in her maternal aunt. Allergies[1]    Outpatient Encounter Medications as of 01/22/2024  Medication Sig   Abaloparatide  (TYMLOS ) 3120 MCG/1.56ML SOPN Inject 80 mcg into the skin daily.   ARMOUR THYROID  30 MG tablet Take 1 tablet (30 mg total) by mouth daily.   aspirin  EC 81 MG tablet Take 1 tablet (81 mg total) by mouth daily. Swallow whole.   cetirizine (ZYRTEC) 10 MG tablet Take 10 mg by mouth See admin instructions. Every 36 hours   estradiol  (ESTRACE ) 0.01 % CREA vaginal cream Use 1/2 g vaginally and to vulvar three times per week.   furosemide  (LASIX ) 40 MG tablet Take 40 mg by mouth daily as needed for fluid or edema.   hydrochlorothiazide  (HYDRODIURIL ) 25 MG tablet Take 1 tablet (25 mg total) by mouth daily.   hydroxychloroquine  (PLAQUENIL ) 200 MG tablet Take 200 mg by mouth every other day.   latanoprost (XALATAN) 0.005 % ophthalmic solution Place 1 drop into the left eye at bedtime.   Misc Natural Products (AIRBORNE ELDERBERRY) CHEW Chew 1 tablet by mouth in the morning and at bedtime.   Resveratrol 50 MG CAPS Take 50 mg by mouth daily.   rosuvastatin  (CRESTOR ) 40 MG tablet Take 1 tablet (40 mg total) by mouth daily.   SIMBRINZA 1-0.2 % SUSP Place 1 drop into the left eye 3 (three) times daily.   Sodium Chloride -Xylitol (XLEAR SINUS CARE SPRAY NA) Place 4 sprays into the nose daily as needed (Moisturing).   Ubiquinol 100 MG CAPS Take 100 mg by mouth.   valsartan  (DIOVAN ) 160 MG tablet Take 1 tablet (160 mg total) by mouth 2 (two) times daily.   verapamil  (VERELAN ) 120 MG 24 hr capsule Take 1 capsule (120 mg total) by mouth at bedtime.   pantoprazole  (PROTONIX ) 40 MG tablet Take 1 tablet (40 mg total) by mouth daily. (Patient not taking: Reported on 01/22/2024)   No facility-administered encounter medications on file as of 01/22/2024.     REVIEW OF SYSTEMS  : All other systems reviewed and negative except where noted in the History of Present  Illness.   PHYSICAL EXAM: BP (!) 130/54   Pulse (!) 56   Ht 5' 1 (1.549 m)   Wt 84 lb (38.1 kg)   BMI 15.87 kg/m  General: Well developed white female in no acute distress Head: Normocephalic and atraumatic Eyes:  Sclerae anicteric, conjunctiva pink. Ears: Normal auditory acuity Lungs: Clear throughout to auscultation; no W/R/R. Heart: Slightly bradycardic but regular rhythm. Abdomen: Soft, maybe slightly distended.  BS present.  Non-tender. Musculoskeletal: Symmetrical with no gross deformities  Skin: No lesions on visible extremities Extremities: No edema  Neurological: Alert oriented x 4, grossly non-focal Psychological:  Alert and cooperative. Normal mood and affect Assessment & Plan Small intestinal bacterial overgrowth Chronic, refractory symptoms persist despite prior rifaximin  therapy x 21 days, necessitating alternative antibiotic management. Recurrence is possible and future antibiotic courses may be required. - Prescribed metronidazole  (Flagyl ) 250 mg TID x 10 days as alternative antibiotic therapy. - Discussed potential for recurrent SIBO and need for future antibiotic courses if symptoms recur. - Ordered abdominal x-ray to assess for significant stool burden.  Suspected gastroparesis Chronic early satiety and persistent fullness raise suspicion for delayed gastric emptying, which may represent a separate  process from SIBO. - Ordered gastric emptying scan to evaluate for delayed gastric emptying. - Discussed that if gastric emptying scan is abnormal, a trial of metoclopramide (Reglan) may be considered.  Unintentional weight loss Chronic, significant weight loss with cyclical pattern over two years, likely multifactorial and related to gastrointestinal motility disorder and SIBO. Prior malignancy workup was negative. - Address underlying SIBO and suspected gastroparesis as contributors to weight loss. - Scheduled follow-up visit within 4-6 weeks to reassess weight,  symptoms, and response to interventions.  Chronic bloating and abdominal distention Persistent, daily bloating and visible abdominal distention are likely multifactorial, with SIBO as a primary contributor. Symptoms have not responded to current dietary or over-the-counter therapies. - Ordered abdominal x-ray to assess for significant stool burden. - Continue current use of Benefiber and Ibguard, as these are aiding in regular bowel movements.   CC:  Kuneff, Renee A, DO       [1]  Allergies Allergen Reactions   Amlodipine  Other (See Comments)    Bilateral lower extremity edema   Augmentin [Amoxicillin-Pot Clavulanate] Other (See Comments)    unk   Tramadol     Nausea    Flexeril [Cyclobenzaprine] Palpitations   "

## 2024-01-22 NOTE — Telephone Encounter (Signed)
 IC provided

## 2024-01-22 NOTE — Patient Instructions (Signed)
 Your provider has requested that you have an abdominal x ray before leaving today. Please go to the basement floor to our Radiology department for the test.  You have been scheduled for a gastric emptying scan at [facility] on Thursday 02/11/24 at 8:30 am. Please arrive at least 30 minutes prior to your appointment for registration. Please make certain not to have anything to eat or drink after midnight the night before your test. Hold all stomach medications (ex: Zofran , phenergan , Reglan) 24 hours prior to your test. If you need to reschedule your appointment, please contact radiology scheduling at [phone]. _____________________________________________________________________ A gastric-emptying study measures how long it takes for food to move through your stomach. There are several ways to measure stomach emptying. In the most common test, you eat food that contains a small amount of radioactive material. A scanner that detects the movement of the radioactive material is placed over your abdomen to monitor the rate at which food leaves your stomach. This test normally takes about 4 hours to complete. _____________________________________________________________________  We have sent the following medications to your pharmacy for you to pick up at your convenience: Flagyl  250 mg three times daily for 10 days.

## 2024-01-28 ENCOUNTER — Telehealth: Payer: Self-pay | Admitting: Physician Assistant

## 2024-01-28 NOTE — Telephone Encounter (Signed)
 Pt called stating she is a pt of Ronal Dragon for Osteoporosis  and asked to speak to Ronal Dragon CMA. She has questions about medications. Please call pt at [phone].

## 2024-01-29 NOTE — Telephone Encounter (Signed)
 I called patient and discussed. I advised her she is approved for Tymlos .  [facility]ty [facility] will get w/ her to arrange shipment of meds to her.  She will call us  to advise when she started it, so we can sched a follow up appt for labwork.  FYI.  No further needed at this time.

## 2024-02-02 ENCOUNTER — Ambulatory Visit: Admitting: Gastroenterology

## 2024-02-02 DIAGNOSIS — M1611 Unilateral primary osteoarthritis, right hip: Secondary | ICD-10-CM

## 2024-02-02 DIAGNOSIS — M1711 Unilateral primary osteoarthritis, right knee: Secondary | ICD-10-CM

## 2024-02-02 DIAGNOSIS — M1612 Unilateral primary osteoarthritis, left hip: Secondary | ICD-10-CM

## 2024-02-02 DIAGNOSIS — M1712 Unilateral primary osteoarthritis, left knee: Secondary | ICD-10-CM

## 2024-02-09 ENCOUNTER — Telehealth: Payer: Self-pay | Admitting: *Deleted

## 2024-02-09 NOTE — Telephone Encounter (Signed)
 Call returned to patient. Spoke with patient and her spouse Jerilynn. Patient is currently scheduled for pessary check and possible biopsy on 1/28 at 1000. Patient is requesting to r/s to 02/29/24. Patient rescheduled for 02/29/24 at 1130 with Dr. Nikki for pessary check and vulvar biopsy.    Routing to provider for final review. Patient is agreeable to disposition. Will close encounter.

## 2024-02-10 ENCOUNTER — Ambulatory Visit: Admitting: Obstetrics and Gynecology

## 2024-02-11 ENCOUNTER — Ambulatory Visit (HOSPITAL_COMMUNITY)
Admission: RE | Admit: 2024-02-11 | Discharge: 2024-02-11 | Disposition: A | Discharge status: Home / Self Care | Source: Ambulatory Visit | Attending: Gastroenterology | Admitting: Gastroenterology

## 2024-02-11 DIAGNOSIS — R6881 Early satiety: Secondary | ICD-10-CM | POA: Insufficient documentation

## 2024-02-11 MED ORDER — TECHNETIUM TC 99M SULFUR COLLOID
2.0000 | Freq: Once | INTRAVENOUS | Status: AC
  Administered 2024-02-11: 2 via ORAL

## 2024-02-17 ENCOUNTER — Encounter: Payer: Self-pay | Admitting: Gastroenterology

## 2024-02-17 ENCOUNTER — Ambulatory Visit: Admitting: Gastroenterology

## 2024-02-17 ENCOUNTER — Other Ambulatory Visit

## 2024-02-17 VITALS — BP 124/70 | HR 71 | Ht 61.0 in | Wt 91.0 lb

## 2024-02-17 DIAGNOSIS — K588 Other irritable bowel syndrome: Secondary | ICD-10-CM

## 2024-02-17 DIAGNOSIS — R140 Abdominal distension (gaseous): Secondary | ICD-10-CM

## 2024-02-17 DIAGNOSIS — F5001 Anorexia nervosa, restricting type, mild: Secondary | ICD-10-CM

## 2024-02-17 DIAGNOSIS — E538 Deficiency of other specified B group vitamins: Secondary | ICD-10-CM

## 2024-02-17 DIAGNOSIS — R634 Abnormal weight loss: Secondary | ICD-10-CM

## 2024-02-17 LAB — VITAMIN B12: Vitamin B-12: 756 pg/mL (ref 211–911)

## 2024-02-17 NOTE — Progress Notes (Unsigned)
 "                Wendy Jackson    [id number]    02-23-1945  Primary Care Physician:Kuneff, Charlies LABOR, DO  Referring Physician: Catherine Charlies LABOR, DO [address] [place],  [place] [address]   Chief complaint:  post prandial fullness, abdominal bloating, anorexia  Discussed the use of AI scribe software for clinical note transcription with the patient, who gave verbal consent to proceed.  History of Present Illness Wendy Jackson is a 79 year old female with GERD who presents for follow-up of worsening bloating.  Abdominal Bloating and Gastrointestinal Symptoms: - Progressive abdominal fullness and bloating, with swelling increasing significantly by the end of the day - Early satiety present, requiring effort to eat despite persistent fullness - No improvement with previous courses of Xifaxan  or Flagyl  - No current use of Dulcolax or other laxatives; [place] discontinued - Bowel movements regular and formed, without constipation - No current use of pantoprazole ; uses acid reducers only as needed (previously Pepcid  and primotinine)  Weight Loss and Nutritional Intake: - BMI currently 14.5, with weight down from approximately 100 pounds - Energy levels low, attributed to insufficient caloric intake - Estimated daily caloric intake 500-600 calories - Diet consists of small portions: breakfast of about fifteen pieces of cut-up fruit, lunch of one yogurt, dinner is a normal meal with meat (fish or pork) and vegetables - Follows a gluten-free diet, drinks water, and occasionally uses protein shakes - Daily supplements include a multivitamin ([employer] brand) and a probiotic  Neurological Symptoms and Balance: - Worsening balance, described as not good and getting worse - New onset tingling sensations - Uncertain timing of last B12 and vitamin D  checks, but believes these are monitored annually without abnormal results reported  Functional Status and Support: - Able to move her body every  morning after getting up - Family member provides support and keeps records of meals and symptoms  No chest pain, shortness of breath, or palpitations. Weight has fluctuated between 84 and 92 pounds over the past two years, with current weight at 84 pounds. Increasing weakness has led to the need for a walker for ambulation.    A 21-day course of Xifaxan  in December 2025 for small intestinal bacterial overgrowth did not improve bloating or gastrointestinal symptoms. Had a positive SIBO breath test.  IBguard and Benefiber have helped with bowel regularity but not bloating.  She was subsequently treated with Flagyl , no improvement   Prior workup includes CT scan in February 2024 and endoscopy with colonoscopy in March 2024 for evaluation of weight loss and abdominal bloating. SIBO breath test was completed recently. Pelvic floor physical therapy was pursued for abdominal and core weakness, and physical therapy recently for a knee injury.  Gastric emptying scan 02/11/24 1 Hour: 31% emptying at 1 hour. (Normal emptying at 1 hour is > 10%).   2 Hours: 62% emptying at 2 hours. (Normal emptying at 2 hours is > 40%).   3 Hours: 59% emptying at 3 hours. (Normal emptying at 3 hours is > 70%).   4 Hours: 88% emptying at 4 hours. (Normal emptying at 4 hours is > 90%).   No significant reflux or esophageal retention is noted.  Abd X ray 01/22/24 No abnormal bowel dilatation. Mild amount of stool seen in the colon. No radio-opaque calculi or other significant radiographic abnormality are seen.  EGD 03/17/2022 - Salmon- colored mucosa suspicious for Barrett' s esophagus. Biopsied (reflux esophagitis, negative  for Barrett's).  - Normal stomach. Biopsied (mild gastritis, negative for H. Pylori).  - Normal examined duodenum. Biopsied (negative for celiac).   Colonoscopy 03/17/22 - Altered vascular, erythematous and friable ( with contact bleeding) mucosa in the rectum. Biopsied (no inflammation, normal  mucosa).  - One less than 1 mm polyp at the hepatic flexure, removed with a cold biopsy forceps. Resected and retrieved (tubular adenoma).  - The examination was otherwise normal on direct and retroflexion views.  Outpatient Encounter Medications as of 02/17/2024  Medication Sig   Abaloparatide  (TYMLOS ) 3120 MCG/1.56ML SOPN Inject 80 mcg into the skin daily.   ARMOUR THYROID  30 MG tablet Take 1 tablet (30 mg total) by mouth daily.   aspirin  EC 81 MG tablet Take 1 tablet (81 mg total) by mouth daily. Swallow whole.   cetirizine (ZYRTEC) 10 MG tablet Take 10 mg by mouth See admin instructions. Every 36 hours   estradiol  (ESTRACE ) 0.01 % CREA vaginal cream Use 1/2 g vaginally and to vulvar three times per week.   furosemide  (LASIX ) 40 MG tablet Take 40 mg by mouth daily as needed for fluid or edema.   hydrochlorothiazide  (HYDRODIURIL ) 25 MG tablet Take 1 tablet (25 mg total) by mouth daily.   hydroxychloroquine  (PLAQUENIL ) 200 MG tablet Take 200 mg by mouth every other day.   latanoprost (XALATAN) 0.005 % ophthalmic solution Place 1 drop into the left eye at bedtime.   metroNIDAZOLE  (FLAGYL ) 250 MG tablet Take 1 tablet (250 mg total) by mouth 3 (three) times daily.   Misc Natural Products (AIRBORNE ELDERBERRY) CHEW Chew 1 tablet by mouth in the morning and at bedtime.   pantoprazole  (PROTONIX ) 40 MG tablet Take 1 tablet (40 mg total) by mouth daily. (Patient not taking: Reported on 01/22/2024)   Resveratrol 50 MG CAPS Take 50 mg by mouth daily.   rosuvastatin  (CRESTOR ) 40 MG tablet Take 1 tablet (40 mg total) by mouth daily.   SIMBRINZA 1-0.2 % SUSP Place 1 drop into the left eye 3 (three) times daily.   Sodium Chloride -Xylitol (XLEAR SINUS CARE SPRAY NA) Place 4 sprays into the nose daily as needed (Moisturing).   Ubiquinol 100 MG CAPS Take 100 mg by mouth.   valsartan  (DIOVAN ) 160 MG tablet Take 1 tablet (160 mg total) by mouth 2 (two) times daily.   verapamil  (VERELAN ) 120 MG 24 hr capsule Take 1  capsule (120 mg total) by mouth at bedtime.   No facility-administered encounter medications on file as of 02/17/2024.    Allergies as of 02/17/2024 - Review Complete 02/11/2024  Allergen Reaction Noted   Amlodipine  Other (See Comments) 12/11/2020   Augmentin [amoxicillin-pot clavulanate] Other (See Comments) 08/04/2019   Tramadol  03/06/2020   Flexeril [cyclobenzaprine] Palpitations 08/04/2019    Past Medical History:  Diagnosis Date   Age-related osteoporosis without current pathological fracture 12/31/2023   Cataract 03/2019   Glaucoma Approximately 2005   Horseshoe retinal tear of left eye 04/02/2021   Hyperlipidemia LDL goal <70 01/11/2021   Hypertension 2022   Long-term use of Plaquenil  04/02/2021   Lupus    new rheum questioning original dx. continuing Plaquenil  at lower dose for now.   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 bladder, acquired    Pseudophakia of both eyes 04/02/2021   Retinal detachment with multiple breaks, right eye 04/02/2021   Stroke (HCC) 04/2020   Thyroid  disease    Tibial plateau fracture,  right 2025    Past Surgical History:  Procedure Laterality Date   ENDARTERECTOMY Right 05/02/2020   Procedure: RIGHT CAROTID ENDARTERECTOMY;  Surgeon: Oris Krystal FALCON, MD;  Location: [facility];  Service: Vascular;  Laterality: Right;   EYE SURGERY  01/2020   LEFT HEART CATH AND CORONARY ANGIOGRAPHY N/A 02/03/2023   Procedure: LEFT HEART CATH AND CORONARY ANGIOGRAPHY;  Surgeon: Verlin Lonni BIRCH, MD;  Location: [facility] INVASIVE CV LAB;  Service: Cardiovascular;  Laterality: N/A;   SCLERAL BUCKLE Right 2004   TUBAL LIGATION  Feb 1982    Family History  Problem Relation Age of Onset   ALS Mother    Early death Mother    Dementia Father    Hypertension Father    Macular degeneration Maternal Aunt    Alcohol abuse Paternal Uncle    Alcohol abuse Paternal Uncle     Social History   Socioeconomic History    Marital status: Married    Spouse name: Ubaldo   Number of children: 2   Years of education: Not on file   Highest education level: Some college, no degree  Occupational History   Occupation: retired   Occupation: retired  Tobacco Use   Smoking status: Never    Passive exposure: Never   Smokeless tobacco: Never  Vaping Use   Vaping status: Never Used  Substance and Sexual Activity   Alcohol use: Never   Drug use: Never   Sexual activity: Not Currently    Birth control/protection: Surgical, Post-menopausal    Comment: tubal  Other Topics Concern   Not on file  Social History Narrative   Marital status/children/pets: Married.  2 children.   Education/employment: Retired.  College-educated.   Safety:      -smoke alarm in the home:Yes     - wears seatbelt: Yes     - Feels safe in their relationships: Yes      Social Drivers of Health   Tobacco Use: Low Risk (01/22/2024)   Patient History    Smoking Tobacco Use: Never    Smokeless Tobacco Use: Never    Passive Exposure: Never  Financial Resource Strain: Low Risk (11/07/2023)   Overall Financial Resource Strain (CARDIA)    Difficulty of Paying Living Expenses: Not hard at all  Food Insecurity: No Food Insecurity (11/07/2023)   Epic    Worried About Programme Researcher, Broadcasting/film/video in the Last Year: Never true    Ran Out of Food in the Last Year: Never true  Transportation Needs: No Transportation Needs (11/07/2023)   Epic    Lack of Transportation (Medical): No    Lack of Transportation (Non-Medical): No  Physical Activity: Unknown (06/24/2023)   Exercise Vital Sign    Days of Exercise per Week: 0 days    Minutes of Exercise per Session: Patient declined  Stress: No Stress Concern Present (06/24/2023)   [employer] of Occupational Health - Occupational Stress Questionnaire    Feeling of Stress : Only a little  Social Connections: Unknown (11/07/2023)   Social Connection and Isolation Panel    Frequency of Communication with  Friends and Family: Not on file    Frequency of Social Gatherings with Friends and Family: Not on file    Attends Religious Services: Not on file    Active Member of Clubs or Organizations: Not on file    Attends Banker Meetings: Not on file    Marital Status: Married  Intimate Partner Violence: Not At Risk (06/24/2023)   Humiliation,  Afraid, Rape, and Kick questionnaire    Fear of Current or Ex-Partner: No    Emotionally Abused: No    Physically Abused: No    Sexually Abused: No  Depression (PHQ2-9): High Risk (11/10/2023)   Depression (PHQ2-9)    PHQ-2 Score: 16  Alcohol Screen: Low Risk (06/24/2023)   Alcohol Screen    Last Alcohol Screening Score (AUDIT): 0  Housing: Unknown (11/07/2023)   Epic    Unable to Pay for Housing in the Last Year: No    Number of Times Moved in the Last Year: Not on file    Homeless in the Last Year: No  Utilities: Not At Risk (06/24/2023)   [facility] Utilities    Threatened with loss of utilities: No  Health Literacy: Adequate Health Literacy (06/24/2023)   B1300 Health Literacy    Frequency of need for help with medical instructions: Never      Review of systems: All other review of systems negative except as mentioned in the HPI.   Physical Exam: Vitals:   02/17/24 1329  BP: 124/70  Pulse: 71   Body mass index is 17.19 kg/m. Gen:      No acute distress HEENT:  sclera anicteric CV: s1s2 rrr, no murmur Lungs: B/l clear. Abd:      soft, non-tender; no palpable masses, no distension Ext:    No edema Neuro: alert and oriented x 3 Psych: normal mood and affect  Data Reviewed:  Reviewed labs, radiology imaging, old records and pertinent past GI work up    Assessment & Plan Chronic bloating and weight loss Chronic, severe symptoms with low BMI and early satiety attributed to functional gastrointestinal disease and insufficient caloric intake. Structural pathology has been excluded. Dietary modification is expected to improve  symptoms and energy over several months, with low risk and high anticipated benefit. - Recommended six to eight small, frequent meals or snacks every two to three hours, emphasizing dietary diversity and avoidance of highly processed foods. - Advised continuation of gluten-free diet if preferred, with increased dietary variety and color. - Recommended discontinuation of probiotic supplementation to reduce risk of bacterial overgrowth. - Encouraged use of protein shakes or elemental diet shakes as tolerated to increase caloric intake. - Advised intake of at least eight cups of water daily. - Provided education on prioritizing dietary diversity over probiotic supplementation for gut health. - Discussed that improvement may require three to six months, with focus on improved well-being and energy rather than weight gain. - Reassured that low BMI is not concerning if she remains otherwise healthy and feels well. - Ordered vitamin B12 level due to worsening balance and risk of nutritional deficiency. - Scheduled follow-up in a few months to reassess progress.  Gastroesophageal reflux disease GERD currently asymptomatic and not requiring regular acid suppression. Intermittent symptoms may be managed with as-needed therapy, with low risk from this approach. - Advised use of pantoprazole  or famotidine  as needed for symptoms of bloating or acid reflux. - Reassured that regular acid suppression is unnecessary if asymptomatic.  IBS with constipation and diarrhea No current complaints of fecal incontinence or constipation, with daily formed bowel movements. No intervention required unless symptoms change. - Advised use of docusate as needed for constipation, up to three times daily, but not if bowel movements remain regular and soft. - Reinforced that no laxative is needed if bowel function remains normal.        This visit required >40 minutes of patient care (this includes precharting, chart  review, review of results, face-to-face time used for counseling as well as treatment plan and follow-up. The patient was provided an opportunity to ask questions and all were answered. The patient agreed with the plan and demonstrated an understanding of the instructions.  Wendy Jackson , MD    CC: Catherine Charlies LABOR, DO    "

## 2024-02-17 NOTE — Patient Instructions (Addendum)
 Your provider has requested that you go to the basement level for lab work before leaving today. Press B on the elevator. The lab is located at the first door on the left as you exit the elevator.  VISIT SUMMARY:  Today, we discussed your worsening bloating and weight loss. We reviewed your diet and gastrointestinal symptoms, and we made a plan to help improve your symptoms and energy levels over the next few months.  YOUR PLAN:  CHRONIC BLOATING AND WEIGHT LOSS: Your symptoms are likely due to functional gastrointestinal disease and insufficient caloric intake. We need to focus on improving your diet to help with your symptoms and energy levels. -Eat six to eight small, frequent meals or snacks every two to three hours. Avoid highly processed foods. -Continue your gluten-free diet if you prefer, but try to increase the variety and color of your foods. -Stop taking probiotic supplements to reduce the risk of bacterial overgrowth. -Use protein shakes or elemental diet shakes as tolerated to increase your caloric intake. -Drink at least eight cups of water daily. -Focus on dietary diversity for gut health instead of probiotic supplements. -Improvement may take three to six months. Focus on feeling better and having more energy rather than gaining weight. -We will check your vitamin B12 level due to your worsening balance and risk of nutritional deficiency. -We will follow up in a few months to reassess your progress.  GASTROESOPHAGEAL REFLUX DISEASE (GERD): Your GERD is currently not causing symptoms and does not require regular medication. -Use pantoprazole  or famotidine  as needed for symptoms of bloating or acid reflux. -Regular acid suppression is not necessary if you do not have symptoms.   -Use docusate as needed for constipation, up to three times daily, but only if your bowel movements are not regular and soft. -No laxative is needed if your bowel function remains normal.  Due to  recent changes in healthcare laws, you may see the results of your imaging and laboratory studies on MyChart before your provider has had a chance to review them.  We understand that in some cases there may be results that are confusing or concerning to you. Not all laboratory results come back in the same time frame and the provider may be waiting for multiple results in order to interpret others.  Please give us  48 hours in order for your provider to thoroughly review all the results before contacting the office for clarification of your results.    I appreciate the  opportunity to care for you  Thank You   Kavitha Nandigam , MD

## 2024-02-18 ENCOUNTER — Other Ambulatory Visit: Payer: Self-pay

## 2024-02-18 DIAGNOSIS — M810 Age-related osteoporosis without current pathological fracture: Secondary | ICD-10-CM

## 2024-02-19 ENCOUNTER — Encounter: Payer: Self-pay | Admitting: Gastroenterology

## 2024-02-19 ENCOUNTER — Ambulatory Visit: Payer: Self-pay | Admitting: Gastroenterology

## 2024-02-24 ENCOUNTER — Ambulatory Visit: Admitting: Dermatology

## 2024-02-29 ENCOUNTER — Ambulatory Visit: Admitting: Obstetrics and Gynecology

## 2024-04-05 ENCOUNTER — Ambulatory Visit: Admitting: Gastroenterology

## 2024-06-29 ENCOUNTER — Encounter

## 2024-07-06 ENCOUNTER — Encounter
